# Patient Record
Sex: Male | Born: 1940
Health system: Southern US, Community
[De-identification: ages and names within clinical notes are randomized; demographics above are authoritative.]

## PROBLEM LIST (undated history)

## (undated) DIAGNOSIS — D369 Benign neoplasm, unspecified site: Secondary | ICD-10-CM

## (undated) DIAGNOSIS — J439 Emphysema, unspecified: Secondary | ICD-10-CM

## (undated) DIAGNOSIS — E785 Hyperlipidemia, unspecified: Secondary | ICD-10-CM

## (undated) DIAGNOSIS — I219 Acute myocardial infarction, unspecified: Secondary | ICD-10-CM

## (undated) DIAGNOSIS — M1990 Unspecified osteoarthritis, unspecified site: Secondary | ICD-10-CM

## (undated) DIAGNOSIS — I2510 Atherosclerotic heart disease of native coronary artery without angina pectoris: Secondary | ICD-10-CM

## (undated) DIAGNOSIS — I10 Essential (primary) hypertension: Secondary | ICD-10-CM

## (undated) DIAGNOSIS — M489 Spondylopathy, unspecified: Secondary | ICD-10-CM

## (undated) DIAGNOSIS — F419 Anxiety disorder, unspecified: Secondary | ICD-10-CM

## (undated) DIAGNOSIS — H409 Unspecified glaucoma: Secondary | ICD-10-CM

## (undated) DIAGNOSIS — Z972 Presence of dental prosthetic device (complete) (partial): Secondary | ICD-10-CM

## (undated) DIAGNOSIS — Z86711 Personal history of pulmonary embolism: Secondary | ICD-10-CM

## (undated) DIAGNOSIS — K529 Noninfective gastroenteritis and colitis, unspecified: Secondary | ICD-10-CM

## (undated) DIAGNOSIS — C801 Malignant (primary) neoplasm, unspecified: Secondary | ICD-10-CM

## (undated) DIAGNOSIS — M545 Low back pain, unspecified: Secondary | ICD-10-CM

## (undated) DIAGNOSIS — H269 Unspecified cataract: Secondary | ICD-10-CM

## (undated) DIAGNOSIS — G4730 Sleep apnea, unspecified: Secondary | ICD-10-CM

## (undated) DIAGNOSIS — G5602 Carpal tunnel syndrome, left upper limb: Secondary | ICD-10-CM

## (undated) DIAGNOSIS — I714 Abdominal aortic aneurysm, without rupture, unspecified: Secondary | ICD-10-CM

## (undated) DIAGNOSIS — J342 Deviated nasal septum: Secondary | ICD-10-CM

## (undated) HISTORY — DX: Carpal tunnel syndrome, left upper limb: G56.02

## (undated) HISTORY — DX: Deviated nasal septum: J34.2

## (undated) HISTORY — DX: Spondylopathy, unspecified: M48.9

## (undated) HISTORY — DX: Unspecified cataract: H26.9

## (undated) HISTORY — DX: Malignant (primary) neoplasm, unspecified: C80.1

## (undated) HISTORY — DX: Unspecified glaucoma: H40.9

## (undated) HISTORY — DX: Acute myocardial infarction, unspecified: I21.9

## (undated) HISTORY — PX: HERNIA REPAIR: SHX51

## (undated) HISTORY — DX: Noninfective gastroenteritis and colitis, unspecified: K52.9

## (undated) HISTORY — DX: Personal history of pulmonary embolism: Z86.711

## (undated) HISTORY — DX: Low back pain, unspecified: M54.50

## (undated) HISTORY — DX: Benign neoplasm, unspecified site: D36.9

## (undated) HISTORY — DX: Essential (primary) hypertension: I10

## (undated) HISTORY — DX: Emphysema, unspecified: J43.9

## (undated) HISTORY — PX: CATARACT EXTRACTION: SUR2

## (undated) HISTORY — DX: Sleep apnea, unspecified: G47.30

## (undated) HISTORY — PX: OTHER SURGICAL HISTORY: SHX169

## (undated) HISTORY — DX: Atherosclerotic heart disease of native coronary artery without angina pectoris: I25.10

## (undated) HISTORY — DX: Unspecified osteoarthritis, unspecified site: M19.90

## (undated) HISTORY — DX: Hyperlipidemia, unspecified: E78.5

## (undated) HISTORY — PX: EYE SURGERY: SHX253

## (undated) HISTORY — DX: Abdominal aortic aneurysm, without rupture, unspecified: I71.40

## (undated) HISTORY — PX: OLECRANON BURSECTOMY: SHX2097

## (undated) HISTORY — DX: Anxiety disorder, unspecified: F41.9

## (undated) HISTORY — DX: Low back pain: M54.5

## (undated) HISTORY — PX: HEMORRHOID BANDING: SHX5850

## (undated) HISTORY — PX: APPENDECTOMY: SHX54

---

## 1996-07-20 DIAGNOSIS — I219 Acute myocardial infarction, unspecified: Secondary | ICD-10-CM

## 1996-07-20 HISTORY — DX: Acute myocardial infarction, unspecified: I21.9

## 1999-07-13 ENCOUNTER — Emergency Department (HOSPITAL_COMMUNITY): Admission: EM | Admit: 1999-07-13 | Discharge: 1999-07-13 | Payer: Self-pay | Admitting: Emergency Medicine

## 1999-07-13 ENCOUNTER — Encounter: Payer: Self-pay | Admitting: Emergency Medicine

## 1999-08-15 ENCOUNTER — Ambulatory Visit: Admission: RE | Admit: 1999-08-15 | Discharge: 1999-08-15 | Payer: Self-pay | Admitting: Geriatric Medicine

## 1999-08-15 ENCOUNTER — Encounter: Payer: Self-pay | Admitting: Internal Medicine

## 2001-05-28 ENCOUNTER — Encounter: Payer: Self-pay | Admitting: Emergency Medicine

## 2001-05-28 ENCOUNTER — Emergency Department (HOSPITAL_COMMUNITY): Admission: EM | Admit: 2001-05-28 | Discharge: 2001-05-28 | Payer: Self-pay | Admitting: Emergency Medicine

## 2001-12-09 ENCOUNTER — Ambulatory Visit (HOSPITAL_COMMUNITY): Admission: RE | Admit: 2001-12-09 | Discharge: 2001-12-09 | Payer: Self-pay | Admitting: Gastroenterology

## 2001-12-09 ENCOUNTER — Encounter (INDEPENDENT_AMBULATORY_CARE_PROVIDER_SITE_OTHER): Payer: Self-pay | Admitting: Specialist

## 2003-05-21 ENCOUNTER — Ambulatory Visit (HOSPITAL_COMMUNITY): Admission: RE | Admit: 2003-05-21 | Discharge: 2003-05-21 | Payer: Self-pay | Admitting: Urology

## 2003-05-21 ENCOUNTER — Ambulatory Visit (HOSPITAL_BASED_OUTPATIENT_CLINIC_OR_DEPARTMENT_OTHER): Admission: RE | Admit: 2003-05-21 | Discharge: 2003-05-21 | Payer: Self-pay | Admitting: Urology

## 2003-05-21 ENCOUNTER — Encounter (INDEPENDENT_AMBULATORY_CARE_PROVIDER_SITE_OTHER): Payer: Self-pay | Admitting: Specialist

## 2004-08-01 ENCOUNTER — Ambulatory Visit (HOSPITAL_COMMUNITY): Admission: RE | Admit: 2004-08-01 | Discharge: 2004-08-02 | Payer: Self-pay | Admitting: Otolaryngology

## 2004-08-15 ENCOUNTER — Emergency Department (HOSPITAL_COMMUNITY): Admission: EM | Admit: 2004-08-15 | Discharge: 2004-08-15 | Payer: Self-pay | Admitting: Emergency Medicine

## 2006-11-08 ENCOUNTER — Inpatient Hospital Stay (HOSPITAL_COMMUNITY): Admission: AD | Admit: 2006-11-08 | Discharge: 2006-11-10 | Payer: Self-pay | Admitting: *Deleted

## 2006-11-10 ENCOUNTER — Encounter (INDEPENDENT_AMBULATORY_CARE_PROVIDER_SITE_OTHER): Payer: Self-pay | Admitting: *Deleted

## 2006-11-22 ENCOUNTER — Encounter: Admission: RE | Admit: 2006-11-22 | Discharge: 2006-11-22 | Payer: Self-pay | Admitting: Gastroenterology

## 2006-12-23 ENCOUNTER — Ambulatory Visit (HOSPITAL_COMMUNITY): Admission: RE | Admit: 2006-12-23 | Discharge: 2006-12-24 | Payer: Self-pay | Admitting: Orthopaedic Surgery

## 2007-10-06 ENCOUNTER — Encounter: Admission: RE | Admit: 2007-10-06 | Discharge: 2007-10-06 | Payer: Self-pay | Admitting: Geriatric Medicine

## 2010-02-05 ENCOUNTER — Ambulatory Visit: Payer: Self-pay | Admitting: Internal Medicine

## 2010-02-05 DIAGNOSIS — I252 Old myocardial infarction: Secondary | ICD-10-CM | POA: Insufficient documentation

## 2010-02-05 DIAGNOSIS — E785 Hyperlipidemia, unspecified: Secondary | ICD-10-CM | POA: Insufficient documentation

## 2010-02-05 DIAGNOSIS — I2510 Atherosclerotic heart disease of native coronary artery without angina pectoris: Secondary | ICD-10-CM | POA: Insufficient documentation

## 2010-02-05 DIAGNOSIS — G4730 Sleep apnea, unspecified: Secondary | ICD-10-CM

## 2010-02-05 DIAGNOSIS — G4710 Hypersomnia, unspecified: Secondary | ICD-10-CM | POA: Insufficient documentation

## 2010-02-05 DIAGNOSIS — F17200 Nicotine dependence, unspecified, uncomplicated: Secondary | ICD-10-CM

## 2010-03-12 ENCOUNTER — Encounter: Admission: RE | Admit: 2010-03-12 | Discharge: 2010-03-12 | Payer: Self-pay | Admitting: Orthopaedic Surgery

## 2010-03-20 HISTORY — PX: OTHER SURGICAL HISTORY: SHX169

## 2010-04-08 ENCOUNTER — Ambulatory Visit: Payer: Self-pay | Admitting: Internal Medicine

## 2010-04-08 ENCOUNTER — Encounter: Payer: Self-pay | Admitting: Internal Medicine

## 2010-04-08 DIAGNOSIS — R05 Cough: Secondary | ICD-10-CM

## 2010-04-22 ENCOUNTER — Encounter: Payer: Self-pay | Admitting: Internal Medicine

## 2010-04-29 ENCOUNTER — Telehealth: Payer: Self-pay | Admitting: Internal Medicine

## 2010-06-18 ENCOUNTER — Telehealth (INDEPENDENT_AMBULATORY_CARE_PROVIDER_SITE_OTHER): Payer: Self-pay | Admitting: *Deleted

## 2010-08-21 NOTE — Assessment & Plan Note (Signed)
Summary: 2 months/apc   Primary Brian [name]/Referring Brian [name]:  Brian [name]  CC:  Follow-up visit with PFTs  and pt compliant with cpap averages 6-7 hrs per night.  History of Present Illness: History of Present Illness: February 05, 2010- 68 yoM coming to re-establish for care of his obstructive sleep apnea. Originally dx'd 08/15/99 with moderate OSA- RDI 29/hr, He has used CPAP continuously since then and recently got a new machine. [facility]- CPAP 7 cwp, His weight has been stable. Bedtime 1130-12MN, latency 2-5 minutes, waking 2-3x/ night before up at 730AM. Usually feels rested.  Remote nasal septoplasty, MI, no lung or thyroid disease.   April 08, 2010- OSA,  tobacco,  CAD Continued CPAP at 7 without problems. Since he has gone many years without attention, we will get an autotitration. In last few days he has noted an incidental watery rhinorhea, scratchy throat and increased phelgm Has not been actively trying to stop smoking and we repeated discussion of this important  issue today. PFT- WNL, insignif response to dilator. FEV1/FVC 0.77 CXR 02/05/10-LLL atx or scar, NAD.  Preventive Screening-Counseling & Management  Alcohol-Tobacco     Smoking Status: current     Smoking Cessation Counseling: yes     Smoke Cessation Stage: precontemplative     Packs/Day: 0.75     Tobacco Counseling: to quit use of tobacco products  Current Medications (verified): 1)  Quinapril Hcl 10 Mg Tabs (Quinapril Hcl) .... Take 1 By Mouth Once Daily 2)  Lipitor 40 Mg Tabs (Atorvastatin Calcium) .... Take 1 By Mouth Once Daily 3)  Toprol Xl 25 Mg Xr24h-Tab (Metoprolol Succinate) .... Take 1 By Mouth Once Daily 4)  Aspirin 81 Mg Tbec (Aspirin) .... Take 1 By Mouth Once Daily 5)  Multivitamins  Tabs (Multiple Vitamin) .... Take 1 By Mouth Once Daily 6)  Cpap-Ahc .... Set On 7 7)  Citracal Plus Bone Density  Tabs (Multiple Minerals-Vitamins) .... Take 1 By Mouth Two Times A Day 8)  Coq10 100 Mg Caps  (Coenzyme Q10) .... 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   Risk Factors: Smoking Status: current (04/08/2010) Packs/Day: 0.75 (04/08/2010)  Past Medical History: Obstructive sleep apnea- NPSG 08/15/99 RDI 29/hr Coronary Heart Disease Myocardial Infarction Hyperlipidemia Tobacco use-                -PFT 04/08/10- FEV1 3.17/ 107%, FEV1/FVC 0.77, DLCO 77%  Review of Systems      See HPI  The patient denies anorexia, fever, weight loss, weight gain, vision loss, decreased hearing, hoarseness, chest pain, syncope, dyspnea on exertion, peripheral edema, prolonged cough, headaches, hemoptysis, abdominal pain, melena, hematochezia, and severe indigestion/heartburn.    Vital Signs:  Patient profile:   70 year old male Height:      70 inches Weight:      167 pounds O2 Sat:      93 % on Room air Pulse rate:   66 / minute BP sitting:   104 / 60  (left arm)  Vitals Entered By: Brian [name]  RCP, LPN (April 08, 2010 10:12 AM)  O2 Flow:  Room air CC: Follow-up visit with PFTs , pt compliant with cpap averages 6-7 hrs per night Comments Medications reviewed with patient Brian [name] RCP, LPN  April 08, 2010 10:13 AM    Physical Exam  Additional Exam:  General: A/Ox3; pleasant and cooperative, NAD, trim SKIN: no rash, lesions NODES: no lymphadenopathy HEENT: [name], EOM- WNL, Conjuctivae- clear, PERRLA, TM-WNL,  Nose- clear, Throat- clear and wnl, dentures, Mallampati  III NECK: Supple w/ fair ROM, JVD- none, normal carotid impulses w/o bruits Thyroid- normal to palpation CHEST: Clear to P&A- can make a raspy  HEART: RRR, no m/g/r heard ABDOMEN: Soft and nl; WUJ:WJXB, nl pulses, no edema  NEURO: Grossly intact to observation      CXR  Procedure date:  02/05/2010  Findings:      CHEST - 2 VIEW   Comparison: 12/22/2006   Findings: There is volume loss in the left lower lobe with associated tenting of the left hemidiaphragm.  The lungs are otherwise clear.  No confluent airspace opacities are seen.  The heart is normal in size.  There are atherosclerotic calcifications are seen in the aortic arch.  Note is made of a coronary artery stent.  The upper abdomen and osseous structures are normal.   IMPRESSION: Left lower lobe atelectasis versus scarring without acute cardiopulmonary disease.   Read By:  Brian [name],  M.D.     Released By:  Brian [name],  M.D.  _______  Pulmonary Function Test Date: 04/08/2010 Height (in.): 70 Gender: Male  Pre-Spirometry FVC    Value: 4.13 L/min   Pred: 4.39 L/min     % Pred: 94% % FEV1    Value: 3.17 L     Pred: 2.97 L     % Pred: 107% % FEV1/FVC  Value: 77 %     Pred: 69 %    FEF 25-75  Value: 2.88 L/min   Pred: 2.70 L/min     % Pred: 107% %  Post-Spirometry FVC    Value: 4.14 L/min   Pred: 4.39 L/min     % Pred: 94% % FEV1    Value: 3.17 L     Pred: 2.97 L     % Pred: 107% % FEV1/FVC  Value: 77 %     Pred: 69 %    FEF 25-75  Value: 2.92 L/min   Pred: 2.70 L/min     % Pred: 108% %  Lung Volumes TLC    Value: 6.06 L   % Pred: 93% % RV    Value: 194 L   % Pred: 77% % DLCO    Value: 15.6 %   % Pred: 77% % DLCO/VA  Value: 2.65 %   % Pred: 75% %  Impression & Recommendations:  Problem # 1:  HYPERSOMNIA WITH SLEEP APNEA UNSPECIFIED (ICD-780.53)  We will get an autotitration done to verify pressure setting is appropriate still, since  it has been many years. Symptomatically he is doing well. He will go together with wife to get flu vax. [name] gave pneumovax years ago.  Problem # 2:  TOBACCO USER (ICD-305.1)  We discussed his very good PFT scores and pointed out that his cardiac disease is good reason to stop smoking.  Other Orders: Est. Patient Level IV (14782) DME Referral (DME)  Patient Instructions: 1)  Please schedule a follow-up appointment in 1 year. 2)   I am pleased your breathing tests are as  good as they are, but for the sake of your heart, i still think it is important that you stop smoking. Nicotine patches may be a good place to start. 3)  See [facility] to set up an autotitration check of your CPAP pressure. We will recommend an adjustment of the pressure if appropriate.   CXR  Procedure date:  02/05/2010  Findings:      CHEST - 2 VIEW   Comparison: 12/22/2006   Findings: There is volume loss in the left lower lobe with associated tenting of the left hemidiaphragm.  The lungs are otherwise clear.  No confluent airspace opacities are seen.  The heart is normal in size.  There are atherosclerotic calcifications are seen in the aortic arch.  Note is made of a coronary artery stent.  The upper abdomen and osseous structures are normal.   IMPRESSION: Left lower lobe atelectasis versus scarring without acute cardiopulmonary disease.   Read By:  Brian [name],  M.D.     Released By:  Brian [name],  M.D.  _______

## 2010-08-21 NOTE — Progress Notes (Signed)
Summary: cpap results  Phone Note Call from Patient Call back at [phone]   Caller: Patient Call For: young Reason for Call: Lab or Test Results Summary of Call: Request his cpap results. Initial call taken by: Darletta Moll,  June 18, 2010 9:30 AM  Follow-up for Phone Call        Pt informed that per note made by CDY on 04/29/2010, cpap compliance looked good and he is leaving setting at 7 for now. Pt had verbal understanding of this and says he is doing well on cpap and having no problems. Follow-up by: Michel Bickers CMA,  June 18, 2010 10:41 AM

## 2010-08-21 NOTE — Assessment & Plan Note (Signed)
Summary: RE-ESTABLISH SLEEP PT ///kp   CC:  Re-establish sleep pt-[facility]..  History of Present Illness: February 05, 2010- 70 yoM coming to re-establish for care of his obstructive sleep apnea. Originally dx'd 08/15/99 with moderate OSA- RDI 29/hr, He has used CPAP continuously since then and recently got a new machine. [facility]- CPAP 7 cwp, His weight has been stable. Bedtime 1130-12MN, latency 2-5 minutes, waking 2-3x/ night before up at 730AM. Usually feels rested.  Remote nasal septoplasty, MI, no lung or thyroid disease.   Preventive Screening-Counseling & Management  Alcohol-Tobacco     Smoking Status: current     Smoking Cessation Counseling: yes     Smoke Cessation Stage: precontemplative     Packs/Day: 0.75     Tobacco Counseling: to quit use of tobacco products  Current Medications (verified): 1)  Quinapril Hcl 10 Mg Tabs (Quinapril Hcl) .... Take 1 By Mouth Once Daily 2)  Lipitor 40 Mg Tabs (Atorvastatin Calcium) .... Take 1 By Mouth Once Daily 3)  Toprol Xl 25 Mg Xr24h-Tab (Metoprolol Succinate) .... Take 1 By Mouth Once Daily 4)  Aspirin 81 Mg Tbec (Aspirin) .... Take 1 By Mouth Once Daily 5)  Multivitamins  Tabs (Multiple Vitamin) .... Take 1 By Mouth Once Daily 6)  Cpap-Ahc .... Set On 7 7)  Citracal Plus Bone Density  Tabs (Multiple Minerals-Vitamins) .... Take 1 By Mouth Two Times A Day 8)  Coq10 100 Mg Caps (Coenzyme Q10) .... 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 Smoking Status:  current Packs/Day:  0.75  Review of Systems       The patient complains of shortness of breath with activity, productive cough, nasal congestion/difficulty breathing through nose, sneezing, itching, and hand/feet swelling.  The patient denies shortness of breath at rest, non-productive cough, coughing up blood, chest pain, irregular heartbeats, acid heartburn, indigestion, loss of appetite, weight change, abdominal pain, difficulty swallowing, sore throat, tooth/dental problems, headaches, ear ache, anxiety, depression, joint stiffness or pain, rash, change in color of mucus, and fever.         Cough when he rolls over in sleep at night  Vital Signs:  Patient profile:   70 year old male Height:      70 inches Weight:      170.38 pounds BMI:     24.54 O2 Sat:      98 % on Room air Pulse rate:   65 / minute BP sitting:   116 / 80  (left arm) Cuff size:   regular  Vitals Entered By: Reynaldo Minium CMA (February 05, 2010 10:52  AM)  O2 Flow:  Room air  Physical Exam  Additional Exam:  General: A/Ox3; pleasant and cooperative, NAD, trim SKIN: no rash, lesions NODES: no lymphadenopathy HEENT: [place]/AT, EOM- WNL, Conjuctivae- clear, PERRLA, TM-WNL, Nose- clear, Throat- clear and wnl, dentures, Mallampati  III NECK: Supple w/ fair ROM, JVD- none, normal carotid impulses w/o  bruits Thyroid- normal to palpation CHEST: Clear to P&A- coughed once with deep breath HEART: RRR, no m/g/r heard ABDOMEN: Soft and nl; nml bowel sounds; no organomegaly or masses noted ZOX:WRUE, nl pulses, no edema  NEURO: Grossly intact to observation      Impression & Recommendations:  Problem # 1:  HYPERSOMNIA WITH SLEEP APNEA UNSPECIFIED (ICD-780.53)  Good compliance and control on CPAP 7. No changes are needed for now. He is used to CPAP now. Has begun sleeping on his stomach more and coughing a bit.   Problem # 2:  TOBACCO USER (ICD-305.1)  He has smoked a long time.with some change in cough; we will get PFT and CXR I have spoken with him about smoking cessation- motivation and available support, with cessation program referral information.  Medications Added to Medication List This Visit: 1)  Quinapril Hcl 10 Mg Tabs (Quinapril hcl) .... Take 1 by mouth once daily 2)  Lipitor 40 Mg Tabs (Atorvastatin calcium) .... Take 1 by mouth once daily 3)  Toprol Xl 25 Mg Xr24h-tab (Metoprolol succinate) .... Take 1 by mouth once daily 4)  Aspirin 81 Mg Tbec (Aspirin) .... Take 1 by mouth once daily 5)  Multivitamins Tabs (Multiple vitamin) .... Take 1 by mouth once daily 6)  Cpap-ahc  .... Set on 7 7)  Citracal Plus Bone Density Tabs (Multiple minerals-vitamins) .... Take 1 by mouth two times a day 8)  Coq10 100 Mg Caps (Coenzyme q10) .... Take 1 by mouth once daily  Other Orders: New Patient Level IV (45409) T-2 View CXR (71020TC)  Patient Instructions: 1)  Please schedule a follow-up appointment in 2 months. 2)  Continue CPAP at 7. 3)  Schedule PFT 4)  A chest x-ray has been recommended.  Your imaging study may require preauthorization.

## 2010-08-21 NOTE — Miscellaneous (Signed)
Summary: Orders Update-PFT CHARGES  Clinical Lists Changes  Problems: Added new problem of COUGH (ICD-786.2) Orders: Added new Service order of Spirometry (Pre & Post) [phone]) - Signed Added new Service order of Lung Volumes (19147) - Signed Added new Service order of Carbon Monoxide diffusing w/capacity (82956) - Signed

## 2010-08-21 NOTE — Progress Notes (Signed)
Summary: CPAP compliance download.- leave on 7 cwp.  Phone Note Other Incoming   Summary of Call: CPAP compliance check from [facility] - Good compliance and control on 7.8 cwp. He is set on 7 so I will leave him there for now.  Initial call taken by: Waymon Budge MD,  April 29, 2010 8:53 AM

## 2010-12-02 NOTE — Op Note (Signed)
[name], [name]               ACCOUNT NO.:  [id number]   MEDICAL RECORD NO.:  [id number]          PATIENT TYPE:  OIB   LOCATION:  2550                         FACILITY:  [facility]   PHYSICIAN:  Brian Basque. Dalldorf, M.D.DATE OF BIRTH:  April 20, 1941   DATE OF PROCEDURE:  12/23/2006  DATE OF DISCHARGE:                               OPERATIVE REPORT   PREOPERATIVE DIAGNOSES:  Right foot first metatarsophalangeal  degenerative arthritis.   POSTOPERATIVE DIAGNOSES:  Right foot first metatarsophalangeal  degenerative arthritis.   PROCEDURE:  Right foot first metatarsophalangeal arthroplasty.   ANESTHESIA:  General and block.   ATTENDING SURGEON:  Brian Basque. Jerl [name], M.D.   ASSISTANT:  Brian [name], P.A.   INDICATIONS FOR PROCEDURE:  The patient is a 70 year old man with a long  history of a painful right forefoot.  This has persisted despite various  oral anti-inflammatories, injections and shoe modifications.  He has  advanced degenerative change of the first MTP joint and at this point is  offered an arthroplasty.  Informed operative consent was obtained after  discussion of possible complications of, reaction to anesthesia,  infection, neurovascular injury and failure.   SUMMARY OF FINDINGS AND PROCEDURE:  Under general anesthesia and a  block, through a dorsomedial incision, a right foot first MTP  arthroplasty was performed.  He had advanced degenerative change but  excellent bone quality.  I removed some large spurs from around the  joint and placed a size five [facility] MTP arthroplasty implant  with grommets on both sides.  I used fluoroscopy throughout the case to  make appropriate intraoperative incisions read these views myself.  Brian [name] assisted throughout and was invaluable to the completion of the  case in that he helped position and retract while I performed the  procedure.  He also closed simultaneously to help minimize OR time.   DESCRIPTION OF  PROCEDURE:  The patient was taken to the operating suite  where general anesthetic was applied without difficulty.  He also given  a block in the preanesthesia area.  He was positioned supine and prepped  and draped in normal sterile fashion.  After the administration of IV  Kefzol the right leg was elevated, exsanguinated, tourniquet inflated  about the calf.  A dorsomedial incision was made with dissection down to  the capsule.  A Y-shaped incision was made here with a distally based  flap.  The first MTP joint was well visualized with Brian [name] retractors  around each side of the metatarsal head.  I removed some large  osteophytes both medial, lateral, and dorsal.  I then used an  oscillating saw to make a distal cut on the metatarsal head in its  thickest portion with about 10 degrees of valgus.  We then made a cut  perpendicular to shaft of the proximal phalanx at its base also with the  oscillating saw.  I then used a small bur and the broaches and he seemed  to accept a size 5 the best.  We performed a trial reduction.  This  seemed to correct the mild valgus deformity appropriately and  seemed to  fit without overstuffing the joint.  We then thoroughly irrigated and  cleaned both bones.  We opened the size 5 [facility] arthroplasty  implant.  I placed the grommet on the end of the metatarsal and then on  the base of the proximal phalanx.  We then placed the component with the  open side up appropriately.  The toe seemed to achieve good alignment.  Fluoroscopy was used to confirm adequate resection of spurs and  placement of the component.  I read these views myself.  The wounds were  again irrigated followed by reapproximation of the capsule with #1  Vicryl in interrupted fashion.  The tourniquet was deflated and a small  amount of bleeding was controlled Bovie cautery and pressure.  His toe  became pink and warm immediately.  Subcutaneous tissues reapproximated  with 2-0 undyed  Vicryl and skin was closed with nylon.  Adaptic was  placed over the wound followed by dry gauze and loose Ace wrap.  Estimated blood loss and intraoperative fluids as well as accurate  tourniquet time can be obtained from anesthesia records.   DISPOSITION:  The patient was extubated in the operating room and taken  to recovery in stable condition.  He will be admitted overnight for  monitoring due to his sleep apnea.  He will likely be discharged home in  the morning.      Brian [name], M.D.  Electronically Signed     PGD/MEDQ  D:  12/23/2006  T:  12/23/2006  Job:  [id number]

## 2010-12-05 NOTE — H&P (Signed)
NAMEHASSEN, BRUUN               ACCOUNT NO.:  [id number]   MEDICAL RECORD NO.:  [id number]          PATIENT TYPE:  INP   LOCATION:  1404                         FACILITY:  [name]   PHYSICIAN:  Hal T. Stoneking, M.D. DATE OF BIRTH:  08-May-1941   DATE OF ADMISSION:  11/08/2006  DATE OF DISCHARGE:                              HISTORY & PHYSICAL   IDENTIFYING DATA:  Mr. Hoffert is a very nice 70 year old white male.  Mr. Degroat has a history of bright red rectal bleeding approximately 5  years ago.  At that time did not require hospitalization, but he did go  on to have a colonoscopy by Dr. Sherin Quarry which revealed diverticulosis.  Biopsies were done as well which were negative for any type of mucosal  abnormality.  This study was done Dec 09, 2001.  He had been feeling  well until about 3 o'clock this morning when he was awakened with  abdominal discomfort and some grumbly sounds in his lower abdomen.  He  went and sat on the commode and had a loose bowel movement.  This  episode recurred an hour or so later.  He did not look down into the  commode; in fact, it was dark,  to examine what the diarrhea looked  like.  Then about 7 o'clock this morning he had an episode, again had  what felt like diarrhea, got up from the commode and saw some blood in  the bottom of the toilet bowl but no clots.  He has felt a little weak  and tired.  He is on a baby aspirin every day and still smokes about one-  half pack of cigarettes a day.  His rarely consumes alcohol.  He also  has a history of coronary artery disease but has had no chest pain or  shortness of breath.   PAST MEDICAL HISTORY:  He has no known drug allergies.   CURRENT MEDICATIONS:  1. Aspirin 81 mg a day.  2. Accupril 10 mg a day.  3. Generic Toprol XL 25 mg a day.  4. Lipitor 40 mg a day.  5. He occasionally takes Fioricet for headache.   PAST MEDICAL HISTORY:  Remarkable for:  1. Allergic in vasomotor rhinitis.  2. History of  a deviated nasal septum.  3. History of sleep apnea.  4. History of chronic low back pain.  5. History of coronary artery disease.  6. History of hypertension.  7. History of diverticulosis.   HEALTH MAINTENANCE:  He had the colonoscopy in May 2003.   FAMILY HISTORY:  Grandfather died of some type of cancer.  Father died  of renal failure and hypertension.  Mother died with CHF, osteoporosis.  Paternal uncle had colon cancer.  Daughter in good health.   SOCIAL HISTORY:  Mr. Moline is married.  He has one daughter.  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  He smokes about one-half pack of cigarettes a  day, rarely consumes alcohol.   REVIEW OF SYSTEMS:  No headache, no change in  vision.  No trouble  chewing or swallowing.  Otherwise GI: Please see HPI.  MUSCULOSKELETAL:  Unremarkable.   PHYSICAL EXAMINATION:  VITAL SIGNS:  Weight 168, temperature 97.8, pulse  64, blood pressure seated 138/82, standing 118/70. Pulse rate goes from  64-70.  SKIN:  Warm and dry.  HEENT: Unremarkable.  LUNGS: Clear.  HEART: Regular rate and rhythm without murmurs, rubs or gallops.  ABDOMEN: Soft.  Slight lower abdominal discomfort to deep palpation, but  no masses felt.  RECTAL:  Exam revealed good tone.  Prostate 2+, no nodules felt.  There  was no stool in the rectal vault.  There was just mucous and some  streaking of blood which was indeed heme positive.   ASSESSMENT:  1. Bloody bowel movement may be the result rather than the cause of      his sensation of diarrhea, although he does have a history of      diverticulosis, and it is possible he could be having diverticular      bleed.  He was, as recorded above, not able to examine the first      two bowel movements.  As he does have a postural blood pressure      drop, we will put him in the hospital under telemetry for 24-hour      observation.  He did have a colonoscopy 5 years  ago that did show      the diverticulosis.  2. Coronary artery disease.  This is stable.  We will hold his aspirin      and his Accupril with his blood pressure drop. We will continue the      Toprol XL.  Will check his CBC q.6 h, and, if this is stable and he      has no other evidence of bleeding by morning, then likely he could      be discharged and followed up as an outpatient.           ______________________________  Sunday Spillers. Pete Glatter, M.D.     HTS/MEDQ  D:  11/08/2006  T:  11/08/2006  Job:  [id number]

## 2010-12-05 NOTE — Consult Note (Signed)
Brian [name], Brian [name]               ACCOUNT NO.:  [id number]   MEDICAL RECORD NO.:  [id number]          PATIENT TYPE:  EMS   LOCATION:  MAJO                         FACILITY:  [facility]   PHYSICIAN:  Jefry H. Pollyann Kennedy, MD     DATE OF BIRTH:  [birth year]   DATE OF CONSULTATION:  08/15/2004  DATE OF DISCHARGE:  08/15/2004                                   CONSULTATION   REASON FOR CONSULTATION:  Postop epistaxis.   HISTORY:  This is a 70 year old gentleman who underwent nasal septoplasty  with excision of turbinates about two weeks prior with Dr. Jearld Fenton.  He had  done extremely well postoperatively until last night when he started having  bleeding from the left side of his nose.  It was anterior and posterior.  He  was evaluated earlier this morning by the emergency department staff and  found to have bleeding posteriorly in the nasal cavity and I was called in  for assistance with his care.  Medical and surgical history, otherwise,  unremarkable.   PHYSICAL EXAMINATION:  Old and fresh blood, left nasal cavity and  nasopharynx.  Clot was cleaned out.  Afrin topically was applied to the left  nasal cavity.  1% Xylocaine with epinephrine was also infiltrated into the  septum and the inferior and middle turbinates.  Careful inspection of the  nasal cavity revealed some active bleeding from the cut surface midway back  of the inferior turbinate.   This area was cauterized with silver nitrate.  Surgicel packing was placed  up against the turbinate and a long Merocel pack was then inserted and  inflated with the local anesthetic solution.  He is instructed to follow up  on Monday when he was scheduled for follow up for Dr. Jearld Fenton to take his  packing out.  He is to continue taking the antibiotics that he was  previously taking which is Augmentin which he has enough to last until the  packing comes out and he is instructed to contact us immediately if he has  any further bleeding.      JHR/MEDQ   D:  08/15/2004  T:  08/15/2004  Job:  [id number]

## 2010-12-05 NOTE — Discharge Summary (Signed)
Brian [name], Brian [name]               ACCOUNT NO.:  [id number]   MEDICAL RECORD NO.:  [id number]          PATIENT TYPE:  INP   LOCATION:  1404                         FACILITY:  [facility]   PHYSICIAN:  Theone Stanley, MD   DATE OF BIRTH:  [birth year]   DATE OF ADMISSION:  11/08/2006  DATE OF DISCHARGE:                               DISCHARGE SUMMARY   ADMISSION DIAGNOSES:  1. Bright red blood per rectum.  2. History of deviated septum.  3. Obstructive sleep apnea.  4. Chronic low back pain.  5. History of coronary artery disease.  6. Hypertension.  7. Diverticulosis.   DISCHARGE DIAGNOSES:  To be dictated by discharging physician.   CONSULTATIONS:  Dr. Ewing Schlein with [facility].   PROCEDURES / DIAGNOSTIC TESTS:  The patient will have a colonoscopy.  Results need to be dictated by discharging physician.   HOSPITAL COURSE:  [name] is a very nice 70 year old gentleman who  apparently had an episode of rectal bleeding about 5 years ago.  A  colonoscopy was performed by Dr. Janann August which revealed diverticulosis.  Biopsies were done which were negative.  At 3:00 on April 20, he awoke,  felt some abdominal cramping, went to the restroom and had bowel  movement.  He went to bed, woke up again and had another bowel movement.  The third time around he noticed that there was an unusual smell with  his bowel movements and therefore he got suspicious.  He turned on the  light and noticed that he had bright red blood per rectum and this was  enough to prompt him to go to Dr. Laverle Hobby.  Dr. Pete Glatter admitted  the patient.  During his stay the patient had one more episode of bright  red blood per rectum.  His hemoglobin and hematocrit remained stable.  He did have evidence of some orthostatics.  After discussing with the  patient the fact that this most likely is a diverticulosis issue or  possibly hemorrhoids, after much discussion of the fact that he had  another episode of bleeding, I asked  Gastroenterology to see the patient  and  they will set up a colonoscopy on November 10, 2006.  If this is negative  or no evidence of major abnormalities, he will be discharged on April  23.   Discharge diagnoses, results of tests, discharge medications and  followup will be dictated by discharging physician.      Theone Stanley, MD  Electronically Signed     AEJ/MEDQ  D:  11/09/2006  T:  11/09/2006  Job:  [id number]   cc:   Hal T. Stoneking, M.D.  Fax: [phone]   Dr. Josefa Half, M.D.  Fax: [phone]

## 2010-12-05 NOTE — Discharge Summary (Signed)
NAMELESHAUN, BIEBEL NO.:  [id number]   MEDICAL RECORD NO.:  [id number]          PATIENT TYPE:  INP   LOCATION:  1404                         [facility]ACILITY:  [facility]   PHYSICIAN:  Barnetta Chapel, MDDATE OF BIRTH:  1941/07/02   DATE OF ADMISSION:  11/08/2006  DATE OF DISCHARGE:                               DISCHARGE SUMMARY   ADDENDUM:  This is actually an addendum to the discharge summary done by  Dr. Jasmine Pang on November 09, 2006.   PRIMARY CARE PHYSICIAN:  Hal T. Stoneking, M.D.   DISCHARGE DIAGNOSES:  1. Gastrointestinal bleed.  2. History of coronary artery disease.  3. Questionable mild inflammation of the colon.  4. Chronic low back pain.  5. Hypertension.  6. History of diverticulosis.  7. Obstructive sleep apnea.   DISCHARGE MEDICATIONS:  1. Toprol-XL 25 mg one daily.  2. Lipitor 40 mg p.o. one daily.  3. Accupril 10 mg p.o. one daily.  4. Multivitamin one tablet one p.o. daily.  5. Hydrocodone/APAP 5/500 p.r.n. for pain.  6. The patient takes aspirin 325 mg p.o. one daily; however, the GI      physician, Dr. Ewing Schlein, has directed the patient should hold aspirin      for 1 week.   PROCEDURES DONE:  The patient had a colonoscopy done on November 10, 2006  by Dr. Ewing Schlein.  The colonoscopy revealed questionable mild inflammation.  No obvious bleeding was found.  Dr. Ewing Schlein has also taken biopsies, which  he plans to review with the patient in his office on the next visit.  Dr. Ewing Schlein has directed that the patient should be off aspirin for at  least 1 week.   HOSPITAL COURSE:  Please refer to the hospital course dictated by Dr.  Jomarie Longs.  This patient has remained stable.  He is no longer having any  bleed.  Vital signs are all stable.  On the day of discharge, the  temperature was 97.9, blood pressure was 110/66 mmHg, heart rate was 66,  respiratory rate was 20 with O2 saturation of 97% on room air.  White  blood count was 10.8, hemoglobin of 12.9,  hematocrit of 37.7, platelet  count of 229, MCV of 87.3.  The patient will be discharged back home  today to the care of the primary care Hence Derrick.   DISCHARGE PLANS:  1. Discharge patient back home today.  2. Follow up with Dr. Pete Glatter in one week.  3. Follow up with Dr. Ewing Schlein as an outpatient.  The patient is to call      for this appointment.  4. Activity as tolerated.  5. Cardiac diet.  6. Please hold aspirin for 1 week.      Barnetta Chapel, MD  Electronically Signed     SIO/MEDQ  D:  11/10/2006  T:  11/10/2006  Job:  [id number]   cc:   Hal T. Stoneking, M.D.  Fax: [phone]   Petra Kuba, M.D.  Fax: [phone]

## 2010-12-05 NOTE — Op Note (Signed)
NAME:  Brian [name], Brian [name]                         ACCOUNT NO.:  [id number]   MEDICAL RECORD NO.:  [id number]                   PATIENT TYPE:  AMB   LOCATION:  NESC                                 FACILITY:  [facility]   PHYSICIAN:  Bertram Millard. Dahlstedt, M.D.          DATE OF BIRTH:  [birth year]   DATE OF PROCEDURE:  05/21/2003  DATE OF DISCHARGE:                                 OPERATIVE REPORT   PREOPERATIVE DIAGNOSIS:  Right spermatocele.   POSTOPERATIVE DIAGNOSIS:  Right spermatocele.   PRINCIPAL PROCEDURE:  Right spermatocelectomy.   SURGEON:  Bertram Millard. Dahlstedt, M.D.   ANESTHESIA:  General with LMA.   COMPLICATIONS:  None.   BRIEF HISTORY:  This 70 year old gentleman has been seen by me a couple of  times with a right scrotal mass.  This was diagnosed as a spermatocele.  It  was recommended that he continue with conservative watchful waiting.  The  patient has had some symptoms associated with spermatocele, namely pressure  and just a vague sense of an ache.  The patient presented to my office  recently, requesting surgical removal of this cyst.  He has had ultrasound  confirmation of the spermatocele.   In discussing the surgery with him, I discussed the risks and complications  of the procedure.  This includes, but is not limited to infection, bleeding,  reformation of the cyst, and wound problems.  He is aware of the risks and  desires to proceed.   DESCRIPTION OF PROCEDURE:  The patient was administered a general anesthetic  and placed in the supine position.  The genitalia and perineum were prepped  and draped.  A 3 cm incision was made in the midline raphe and carried down  through the Dartos fascia with electrocautery.  The right spermatocele and  testicle were then delivered from the wound.  The spermatocele was carefully  dissected circumferentially down to its merging with the epididymis.  There  were actually two to three small loculations of this one  spermatocele.  The  spermatocele was dissected off of the epididymis.  The spermatocele was then  passed from the table.  It had been decompressed.  The open epididymis was  cauterized.  Small bleeders throughout the scrotum were cauterized in the  same fashion.  The cord was blocked with 5 mL of 0.5% plain Marcaine.  The  testicle was then inspected and found to be hemostatic.  It was replaced in  the right hemiscrotum.  The scrotum was closed in two layers with running  chromic.  Tegaderm dressing was placed and a dry sterile fluff dressing  was placed underneath a jock strap.   The patient tolerated the procedure well.  The estimated blood loss was  approximately 5 mL.   The patient was sent home with Vicodin #20 one p.o. q.4h. p.r.n. pain.  He  was also sent home with scrotal procedure instructions.  He will follow up  to see me in two weeks.                                               Bertram Millard. Dahlstedt, M.D.    SMD/MEDQ  D:  05/21/2003  T:  05/21/2003  Job:  [id number]   cc:   Hal T. Stoneking, M.D.  [address] [place], [place] [address]  Fax: [phone]

## 2010-12-05 NOTE — Op Note (Signed)
Brian [name], Brian [name]               ACCOUNT NO.:  [id number]   MEDICAL RECORD NO.:  [id number]          PATIENT TYPE:  INP   LOCATION:  1404                         FACILITY:  [facility]   PHYSICIAN:  Petra Kuba, M.D.    DATE OF BIRTH:  [birth year]   DATE OF PROCEDURE:  11/10/2006  DATE OF DISCHARGE:                               OPERATIVE REPORT   PROCEDURE:  Colonoscopy with biopsy.   INDICATIONS:  Lower GI bleeding.  Due for colonic screening.  Consent  was signed after risks, benefits, methods, options thoroughly discussed  by both myself, Dr. Jomarie Longs and Dr. Bosie Clos prior to any sedation.   MEDICINES USED:  Fentanyl 100 mcg, Versed 9 mg.   PROCEDURE:  Rectal inspection is pertinent for small external  hemorrhoids.  Digital exam is negative.  The video pediatric colonoscope  was inserted.  There was some difficulty due to a long looping colon,  was able to be advanced to the cecum.  No blood was seen on insertion.  Only a rare left-sided diverticula were seen.  The prep was fairly  adequate.  There was some liquid stool that required washing and  suctioning.  On slow withdrawal through the colon, we periodically fell  back around loops and we did try to readvance around these curves to  decrease the chances of missing things.  On slow withdrawal through the  colon other than some distal transverse and approximate splenic flexure  inflammation which correspond approximately to where Dr. Sherin Quarry had  seen some inflammation before, this was very mild and a few scattered  biopsies were obtained.  No other abnormalities were seen as we slowly  withdrew back to the rectum.  Specifically no polyps, tumors, masses,  other diverticula or signs of bleeding.  Once back in the rectum,  anorectal pull-through and retroflexion confirmed some small  hemorrhoids.  Scope was straightened readvanced a short ways up the left  side of the colon.  Air was suctioned, scope removed.  The patient  tolerated the procedure well.  There was no obvious immediate  complication.   ENDOSCOPIC DIAGNOSES:  1. Internal-external small hemorrhoids.  2. Rare left-sided diverticula.  3. Long looping tortuous colon.  4. Mild inflammation in the splenic flexure and distal transverse      status post biopsy, questionable etiology, possibly ischemic.  5. Otherwise within normal limits to the cecum without any blood being      seen.   PLAN:  Await pathology.  Reevaluate his aspirin needs and restarting  them in the future per Dr. Pete Glatter.  Happy to see back p.r.n. or can  get Dr. Lucretia Kern opinion.  Might want an ischemic workup with either a  CTA, MRA or  even any angiogram if both of these episodes are compatible with  ischemia.  Will have to rereview Dr. Gloris Ham biopsies to see what was  said about them and compare them to the current pathology.  Okay with me  to go home if no delayed complications.           ______________________________  Petra Kuba, M.D.  MEM/MEDQ  D:  11/10/2006  T:  11/10/2006  Job:  [id number]   cc:   Theone Stanley, MD   Hal T. Stoneking, M.D.  Fax: [phone]   Tasia Catchings, M.D.  Fax: [phone]

## 2010-12-05 NOTE — Consult Note (Signed)
Brian [name], Brian [name]               ACCOUNT NO.:  [id number]   MEDICAL RECORD NO.:  [id number]          PATIENT TYPE:  INP   LOCATION:  1404                         FACILITY:  [facility]   PHYSICIAN:  Shirley Friar, MDDATE OF BIRTH:  23-May-1941   DATE OF CONSULTATION:  11/10/2006  DATE OF DISCHARGE:                                 CONSULTATION   INDICATION:  Rectal bleeding.   HISTORY OF PRESENT ILLNESS:  Mr. Siefert was seen in consultation at the  request of Dr. Jomarie Longs in regards to his rectal bleeding.  He is a 70-  year-old white male with a history of diverticulosis and a colonoscopy  five years ago by Dr. Sherin Quarry who presented with the acute onset of  crampy abdominal pain followed by four episodes of bright red blood per  rectum that started on Sunday, November 07, 2006.  The first three episodes  occurred without adequate visualization of the toilet water since he had  crampy abdominal pain followed by urge to move his bowels and did so  early in the morning in his bathroom.  The last episode he witnessed and  showed bright red blood per rectum in the bowl without any stool.  He  denies any NSAID use or excessive daily aspirin.  He denies any black  stools.  On admission his hemoglobin was 14.6 with a normal BUN and he  was hemodynamically stable.   PAST MEDICAL HISTORY:  1. Coronary artery disease.  2. Hypertension.  3. Sleep apnea.  4. Allergic rhinitis.  5. Chronic back pain.  6. History of diverticulosis as stated above.   MEDICINES ON ADMISSION:  1. Aspirin 81 mg daily.  2. Accupril 10 mg daily.  3. Toprol XL 25 mg daily.  4. Lipitor 40 mg daily.   ALLERGIES:  NO KNOWN DRUG ALLERGIES.   FAMILY HISTORY:  Maternal uncle had colon cancer.  No other colon cancer  history.   SOCIAL HISTORY:  Rare alcohol use.  Positive tobacco.   PHYSICAL EXAMINATION:  VITAL SIGNS:  Temperature 97.9, pulse 66, blood  pressure 110/66, O2 saturation is 97% on room air.  GENERAL:   Alert in no acute distress.  ABDOMEN:  Soft, nontender, nondistended.  Active bowel sounds.  CARDIOVASCULAR:  Regular rate and rhythm.  CHEST:  Clear to auscultation bilaterally.   LABORATORY DATA:  Hemoglobin 12.9, white blood cell count 10.8, platelet  count 229.  BUN on admission 16, INR 0.9.   IMPRESSION:  A 70 year old male with acute onset of bright red blood per  rectum that I suspect is a diverticular bleed more so than hemorrhoids,  although that is also possible.  Malignancy is unlikely, but still in  the differential.  Agree with taking the patient for a colonoscopy which  will take place today by Dr. Ewing Schlein.  I discussed the risks and benefits  of the procedure with Mr. Andres and he agrees to proceed.  If his  colonoscopy is unremarkable or if there is no evidence of any further  active bleeding then the patient should be able to go home later today.  A final decision will be made after colonoscopy has been completed.      Shirley Friar, MD  Electronically Signed     VCS/MEDQ  D:  11/10/2006  T:  11/10/2006  Job:  [id number]   cc:   Tasia Catchings, M.D.  Fax: [phone]   Hal T. Stoneking, M.D.  Fax: [phone]   Petra Kuba, M.D.  Fax: [phone]

## 2010-12-05 NOTE — Op Note (Signed)
[name], [name] NO.:  [id number]   MEDICAL RECORD NO.:  [id number]          PATIENT TYPE:  OIB   LOCATION:  NA                           FACILITY:  [facility]   PHYSICIAN:  Suzanna Obey, M.D.       DATE OF BIRTH:  1941/07/17   DATE OF PROCEDURE:  08/01/2004  DATE OF DISCHARGE:                                 OPERATIVE REPORT   PREOPERATIVE DIAGNOSES:  1.  Deviated septum.  2.  Turbinate hypertrophy.   POSTOPERATIVE DIAGNOSIS:  1.  Deviated septum.  2.  Turbinate hypertrophy.   SEPTOPLASTY PROCEDURES:  1.  Septoplasty.  2.  Submucous resection of inferior turbinates.   ANESTHESIA:  General endotracheal tube.   ESTIMATED BLOOD LOSS:  Approximately 10 mL.   INDICATIONS:  This is a 70 year old who has had obstructive sleep apnea that  has been refractory to therapy.  CPAP was difficult because of his nasal  obstruction.  He has a deviated septum and nasal obstruction for a long  time.  He was informed of the risks and benefits of the procedure, including  bleeding, infection, perforation of the septum, change in the external  appearance of the nose, change in the sense of smell, chronic crusting and  drying, numbness of the teeth, and risk of the anesthetic.  All questions  were answered, and consent was obtained.   OPERATION:  The patient was taken to the operating room and placed in the  supine position, after adequate general endotracheal tube anesthesia was  prepped and draped in the usual sterile manner.  Oxymetazoline pledges were  placed in the nose bilaterally and the septum and inferior turbinates were  injected with 1% lidocaine with 1:100,000 epinephrine.  The left  hemitransfixion incision was performed, raising a mucoperichondrial and  osteal flap.  The cartilage was divided about 2 cm posterior to the caudal  strut, and this cartilage was removed.  A flap was elevated out to the side  and the deviated portion of the bone and remaining  cartilage posteriorly  were removed with a Jansen-Middleton forceps.  The inferior spur was  removed.  This corrected the septal deflection.  The turbinates were  infractured.  A midline incision made with a 15 blade, mucosal flap elevated  superiorly, and the inferior mucosa and bone were removed with the turbinate  scissors.  The edge was cauterized with suction cautery and both flaps were  laid back down over the raw surface and both turbinates outfractured.  The  hemitransfixion incision closed with  interrupted 4-0 chromic and 4-0 plain gut quilting stitch placed to the  septum.  The Telfa rolls soaked in bacitracin were placed into the nose  bilaterally and secured with a 3-0 nylon.  Oral cavity and oropharynx were  suctioned out of all blood and debris.  The patient was awakened and brought  to recovery in stable condition, counts correct.       JB/MEDQ  D:  08/01/2004  T:  08/01/2004  Job:  [id number]   cc:   Hal T. Stoneking, M.D.  [address] [place], [place] [address]  Fax: [phone]

## 2011-04-06 ENCOUNTER — Encounter: Payer: Self-pay | Admitting: Pulmonary Disease

## 2011-04-07 ENCOUNTER — Ambulatory Visit (INDEPENDENT_AMBULATORY_CARE_PROVIDER_SITE_OTHER)
Admission: RE | Admit: 2011-04-07 | Discharge: 2011-04-07 | Disposition: A | Discharge status: Home / Self Care | Payer: Medicare Other | Source: Ambulatory Visit | Attending: Internal Medicine | Admitting: Internal Medicine

## 2011-04-07 ENCOUNTER — Ambulatory Visit (INDEPENDENT_AMBULATORY_CARE_PROVIDER_SITE_OTHER): Payer: Medicare Other | Admitting: Internal Medicine

## 2011-04-07 ENCOUNTER — Encounter: Payer: Self-pay | Admitting: Internal Medicine

## 2011-04-07 VITALS — BP 110/70 | HR 79 | Wt 168.8 lb

## 2011-04-07 DIAGNOSIS — G4733 Obstructive sleep apnea (adult) (pediatric): Secondary | ICD-10-CM

## 2011-04-07 DIAGNOSIS — J449 Chronic obstructive pulmonary disease, unspecified: Secondary | ICD-10-CM

## 2011-04-07 DIAGNOSIS — R059 Cough, unspecified: Secondary | ICD-10-CM

## 2011-04-07 DIAGNOSIS — F17200 Nicotine dependence, unspecified, uncomplicated: Secondary | ICD-10-CM

## 2011-04-07 DIAGNOSIS — R05 Cough: Secondary | ICD-10-CM

## 2011-04-12 ENCOUNTER — Encounter: Payer: Self-pay | Admitting: Internal Medicine

## 2011-04-12 NOTE — Patient Instructions (Signed)
Order- CXR  Dx cough, COPD

## 2011-04-12 NOTE — Progress Notes (Signed)
  Subjective:    Patient ID: Brian [name], male    DOB: 04/05/1941, 70 y.o.   MRN: [id number]  HPI 04/07/2011-62 year old active smoker up to one pack per day, followed for history of cough, tobacco use, OSA, complicated by CAD/Hx MI One-year followup. He feels he has been doing well and has remained stable. Mild occasional cough. He paces himself to do physical activity almost every day such as lawnmowing. He has made no effort to stop smoking despite repeated counseling.  Review of Systems Constitutional:   No-   weight loss, night sweats, fevers, chills, fatigue, lassitude. HEENT:   No-  headaches, difficulty swallowing, tooth/dental problems, sore throat,       No-  sneezing, itching, ear ache, nasal congestion, post nasal drip,  CV:  No-   chest pain, orthopnea, PND, swelling in lower extremities, anasarca, dizziness, palpitations Resp: No-   shortness of breath with exertion or at rest.              No-   productive cough,  minimal intermittent non-productive cough,  No-  coughing up of blood.              No-   change in color of mucus.  No- wheezing.   Skin: No-   rash or lesions. GI:  No-   heartburn, indigestion, abdominal pain, nausea, vomiting, diarrhea,                 change in bowel habits, loss of appetite GU: No-   dysuria, change in color of urine, no urgency or frequency.  No- flank pain. MS:  No-   joint pain or swelling.  No- decreased range of motion.  No- back pain. Neuro- grossly normal to observation, Or:  Psych:  No- change in mood or affect. No depression or anxiety.  No memory loss.      Objective:   Physical Exam General- Alert, Oriented, Affect-appropriate, Distress- none acute; slender, well-appearing, tan. Skin- rash-none, lesions- none, excoriation- none Lymphadenopathy- none Head- atraumatic            Eyes- Gross vision intact, PERRLA, conjunctivae clear secretions            Ears- Hearing, canals normal            Nose- Clear, no-Septal dev,  mucus, polyps, erosion, perforation             Throat- Mallampati II-III , mucosa clear , drainage- none, tonsils- atrophic Neck- flexible , trachea midline, no stridor , thyroid nl, carotid no bruit Chest - symmetrical excursion , unlabored           Heart/CV- RRR , no murmur , no gallop  , no rub, nl s1 s2                           - JVD- none , edema- none, stasis changes- none, varices- none           Lung- clear to P&A, maybe diminished, unlabored;  wheeze- none, cough- none , dullness-none, rub- none           Chest wall-  Abd- tender-no, distended-no, bowel sounds-present, HSM- no Br/ Gen/ Rectal- Not done, not indicated Extrem- cyanosis- none, clubbing, none, atrophy- none, strength- nl Neuro- grossly intact to observation          Assessment & Plan:

## 2011-04-12 NOTE — Assessment & Plan Note (Signed)
We will update chest x-ray. He has a mild intermittent bronchitis which he doesn't feel or find limiting I have again counseled him on smoking cessation, available supports and techniques.

## 2011-05-07 LAB — BASIC METABOLIC PANEL
CO2: 28
Chloride: 105
Creatinine, Ser: 0.94
GFR calc Af Amer: >60
Potassium: 5.3 — ABNORMAL HIGH

## 2011-05-07 LAB — CBC
HCT: 45.2
MCHC: 33.4
MCV: 88.4
RBC: 5.12
WBC: 7.4

## 2012-04-05 ENCOUNTER — Ambulatory Visit: Payer: Medicare Other | Admitting: Internal Medicine

## 2012-04-26 ENCOUNTER — Other Ambulatory Visit: Payer: Self-pay | Admitting: Gastroenterology

## 2012-05-18 ENCOUNTER — Encounter: Payer: Self-pay | Admitting: Internal Medicine

## 2012-05-18 ENCOUNTER — Ambulatory Visit (INDEPENDENT_AMBULATORY_CARE_PROVIDER_SITE_OTHER): Payer: Medicare Other | Admitting: Internal Medicine

## 2012-05-18 ENCOUNTER — Ambulatory Visit (INDEPENDENT_AMBULATORY_CARE_PROVIDER_SITE_OTHER)
Admission: RE | Admit: 2012-05-18 | Discharge: 2012-05-18 | Disposition: A | Discharge status: Home / Self Care | Payer: Medicare Other | Source: Ambulatory Visit | Attending: Internal Medicine | Admitting: Internal Medicine

## 2012-05-18 VITALS — BP 110/64 | HR 71 | Ht 70.0 in | Wt 174.6 lb

## 2012-05-18 DIAGNOSIS — R05 Cough: Secondary | ICD-10-CM

## 2012-05-18 DIAGNOSIS — G4730 Sleep apnea, unspecified: Secondary | ICD-10-CM

## 2012-05-18 DIAGNOSIS — G4710 Hypersomnia, unspecified: Secondary | ICD-10-CM

## 2012-05-18 DIAGNOSIS — J40 Bronchitis, not specified as acute or chronic: Secondary | ICD-10-CM

## 2012-05-18 DIAGNOSIS — F17200 Nicotine dependence, unspecified, uncomplicated: Secondary | ICD-10-CM

## 2012-05-18 NOTE — Patient Instructions (Addendum)
Sample Spiriva- use one daily till sample used up. See if it helps the cough and chest tightness  Order- CXR   Dx cough, bronchitis  I continue to recommend that you stop smoking. It will save money and take a load off your heart and lungs

## 2012-05-18 NOTE — Progress Notes (Signed)
Subjective:    Patient ID: Brian [name], male    DOB: [birth year], 71 y.o.   MRN: [id number]  HPI 04/07/2011-59 year old active smoker up to one pack per day, followed for history of cough, tobacco use, OSA, complicated by CAD/Hx MI One-year followup. He feels he has been doing well and has remained stable. Mild occasional cough. He paces himself to do physical activity almost every day such as lawnmowing. He has made no effort to stop smoking despite repeated counseling.  05/18/12- 66 year old active smoker up to one pack per day, followed for history of cough, tobacco use, OSA, complicated by CAD/Hx MI Past week having more  of a dry cough now. Usually will be productive; wakes up in mid of night with cough. Wears CPAP every night Still smoking with no effort to quit. Normal PFT 04/08/2010. He paces himself to mow his lawn. Rarely wheezes. Has had flu vaccine CXR 04/06/12 IMPRESSION:  No active cardiopulmonary disease.  Original Report Authenticated By: Donavan Burnet, M.D.   Review of Systems Constitutional:   No-   weight loss, night sweats, fevers, chills, fatigue, lassitude. HEENT:   No-  headaches, difficulty swallowing, tooth/dental problems, sore throat,       No-  sneezing, itching, ear ache, nasal congestion, post nasal drip,  CV:  No-   chest pain, orthopnea, PND, swelling in lower extremities, anasarca, dizziness, palpitations Resp: + Minor  shortness of breath with exertion or at rest.              No-   productive cough,  minimal intermittent non-productive cough,  No-  coughing up of blood.              No-   change in color of mucus.  + Rare wheezing.   Skin: No-   rash or lesions. GI:  No-   heartburn, indigestion, abdominal pain, nausea, vomiting,  GU: n. MS:  No-   joint pain or swelling.  . Neuro- nothing unusual  Psych:  No- change in mood or affect. No depression or anxiety.  No memory loss.  Objective:   Physical Exam General- Alert, Oriented,  Affect-appropriate, Distress- none acute; slender, well-appearing, tan.+ Odor of tobacco Skin- rash-none, lesions- none, excoriation- none. Normal skin color is dark. Lymphadenopathy- none Head- atraumatic            Eyes- Gross vision intact, PERRLA, conjunctivae clear secretions            Ears- Hearing, canals normal            Nose- Clear, no-Septal dev, mucus, polyps, erosion, perforation             Throat- Mallampati II-III , mucosa clear , drainage- none, tonsils- atrophic Neck- flexible , trachea midline, no stridor , thyroid nl, carotid no bruit Chest - symmetrical excursion , unlabored           Heart/CV- RRR , no murmur , no gallop  , no rub, nl s1 s2                           - JVD- none , edema- none, stasis changes- none, varices- none           Lung- clear to P&A, maybe diminished, unlabored;  wheeze- none, + light cough , dullness-none, rub- none           Chest wall-  Abd-  Br/ Gen/ Rectal- Not done, not indicated  Extrem- cyanosis- none, clubbing, none, atrophy- none, strength- nl Neuro- grossly intact to observation  Assessment & Plan:

## 2012-05-29 NOTE — Assessment & Plan Note (Signed)
Good compliance and control. 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

## 2012-05-29 NOTE — Assessment & Plan Note (Signed)
Very mild bronchitis. Plan-emphasis on smoking cessation. Sample Spiriva

## 2012-05-29 NOTE — Assessment & Plan Note (Signed)
He is making no effort to stop on his own but is not allowed to smoke when he visits his wife at their house in [place].

## 2012-06-02 NOTE — Progress Notes (Signed)
Quick Note:  Pt aware of results. ______ 

## 2012-08-15 ENCOUNTER — Other Ambulatory Visit: Payer: Self-pay | Admitting: Geriatric Medicine

## 2012-08-15 DIAGNOSIS — M549 Dorsalgia, unspecified: Secondary | ICD-10-CM

## 2012-08-16 ENCOUNTER — Ambulatory Visit
Admission: RE | Admit: 2012-08-16 | Discharge: 2012-08-16 | Disposition: A | Discharge status: Home / Self Care | Payer: Medicare Other | Source: Ambulatory Visit | Attending: Geriatric Medicine | Admitting: Geriatric Medicine

## 2012-08-16 DIAGNOSIS — M549 Dorsalgia, unspecified: Secondary | ICD-10-CM

## 2012-08-17 ENCOUNTER — Other Ambulatory Visit: Payer: Medicare Other

## 2012-08-18 ENCOUNTER — Other Ambulatory Visit: Payer: Self-pay | Admitting: Geriatric Medicine

## 2012-08-18 DIAGNOSIS — R29898 Other symptoms and signs involving the musculoskeletal system: Secondary | ICD-10-CM

## 2012-08-21 ENCOUNTER — Ambulatory Visit
Admission: RE | Admit: 2012-08-21 | Discharge: 2012-08-21 | Disposition: A | Discharge status: Home / Self Care | Payer: Medicare Other | Source: Ambulatory Visit | Attending: Geriatric Medicine | Admitting: Geriatric Medicine

## 2012-08-21 DIAGNOSIS — R29898 Other symptoms and signs involving the musculoskeletal system: Secondary | ICD-10-CM

## 2012-08-21 MED ORDER — GADOBENATE DIMEGLUMINE 529 MG/ML IV SOLN
17.0000 mL | Freq: Once | INTRAVENOUS | Status: AC | PRN
Start: 2012-08-21 — End: 2012-08-21
  Administered 2012-08-21: 17 mL via INTRAVENOUS

## 2013-05-18 ENCOUNTER — Ambulatory Visit (INDEPENDENT_AMBULATORY_CARE_PROVIDER_SITE_OTHER): Payer: Medicare Other | Admitting: Internal Medicine

## 2013-05-18 ENCOUNTER — Encounter: Payer: Self-pay | Admitting: Internal Medicine

## 2013-05-18 ENCOUNTER — Ambulatory Visit (INDEPENDENT_AMBULATORY_CARE_PROVIDER_SITE_OTHER)
Admission: RE | Admit: 2013-05-18 | Discharge: 2013-05-18 | Disposition: A | Discharge status: Home / Self Care | Payer: Medicare Other | Source: Ambulatory Visit | Attending: Internal Medicine | Admitting: Internal Medicine

## 2013-05-18 VITALS — BP 138/80 | HR 73 | Ht 70.0 in | Wt 164.0 lb

## 2013-05-18 DIAGNOSIS — R0600 Dyspnea, unspecified: Secondary | ICD-10-CM

## 2013-05-18 DIAGNOSIS — Z23 Encounter for immunization: Secondary | ICD-10-CM

## 2013-05-18 DIAGNOSIS — F17200 Nicotine dependence, unspecified, uncomplicated: Secondary | ICD-10-CM

## 2013-05-18 DIAGNOSIS — I219 Acute myocardial infarction, unspecified: Secondary | ICD-10-CM

## 2013-05-18 DIAGNOSIS — R0609 Other forms of dyspnea: Secondary | ICD-10-CM

## 2013-05-18 DIAGNOSIS — R0989 Other specified symptoms and signs involving the circulatory and respiratory systems: Secondary | ICD-10-CM

## 2013-05-18 DIAGNOSIS — G4710 Hypersomnia, unspecified: Secondary | ICD-10-CM

## 2013-05-18 NOTE — Progress Notes (Signed)
Quick Note:  Advised pt of cxr results per CY. Pt verbalized understanding ______

## 2013-05-18 NOTE — Patient Instructions (Signed)
Try lowering your CPAP machine relative to the height of your bed, so water will drain back out of the hose and you can set the humidifier up to avoid dryness  Consider also trying otc Biotene for dry mouth  Please keep trying to stop smoking  Order- schedule PFT   Dx dyspnea with exertion  Order- CXR- dx dyspnea, tobacco user  Prevnar 13 pneumococcal vaccine

## 2013-05-18 NOTE — Progress Notes (Signed)
Subjective:    Patient ID: Brian [name], male    DOB: 02-01-1941, 72 y.o.   MRN: [id number]  HPI 04/07/2011-30 year old active smoker up to one pack per day, followed for history of cough, tobacco use, OSA, complicated by CAD/Hx MI One-year followup. He feels he has been doing well and has remained stable. Mild occasional cough. He paces himself to do physical activity almost every day such as lawnmowing. He has made no effort to stop smoking despite repeated counseling.  05/18/12- 47 year old active smoker up to one pack per day, followed for history of cough, tobacco use, OSA, complicated by CAD/Hx MI Past week having more  of a dry cough now. Usually will be productive; wakes up in mid of night with cough. Wears CPAP every night Still smoking with no effort to quit. Normal PFT 04/08/2010. He paces himself to mow his lawn. Rarely wheezes. Has had flu vaccine CXR 04/06/12 IMPRESSION:  No active cardiopulmonary disease.  Original Report Authenticated By: Donavan Burnet, M.D.   05/18/13-  71 year old active smoker up to one pack per day, followed for history of cough, tobacco use, OSA, complicated by CAD/Hx MI FOLLOWS FOR:  Wearing CPAP/ Advanced 6 hours per night.  Increased SOB w/ exertion x2 months He has moved to [facility] to live with his wife. She won't let him smoke in the house. Has had flu shot. Exercise reduced by spinal stenosis. Occasional morning cough, nonproductive. Denies chest pain, palpitation, edema. CXR 05/18/12 IMPRESSION:  No evidence of acute cardiopulmonary disease.  Original Report Authenticated By: Charline Bills, M.D   Review of Systems- see HPI Constitutional:   No-   weight loss, night sweats, fevers, chills, fatigue, lassitude. HEENT:   No-  headaches, difficulty swallowing, tooth/dental problems, sore throat,       No-  sneezing, itching, ear ache, nasal congestion, post nasal drip,  CV:  No-   chest pain, orthopnea, PND, swelling in lower  extremities, anasarca, dizziness, palpitations Resp: + Minor  shortness of breath with exertion or at rest.              No-   productive cough,  minimal intermittent non-productive cough,  No-  coughing up of blood.              No-   change in color of mucus.  + Rare wheezing.   Skin: No-   rash or lesions. GI:  No-   heartburn, indigestion, abdominal pain, nausea, vomiting,  GU: n. MS:  No-   joint pain or swelling.  . Neuro- nothing unusual  Psych:  No- change in mood or affect. No depression or anxiety.  No memory loss.  Objective:   Physical Exam General- Alert, Oriented, Affect-appropriate, Distress- none acute; slender, well-appearing Skin- rash-none, lesions- none, excoriation- none. Normal skin color is dark. Lymphadenopathy- none Head- atraumatic            Eyes- Gross vision intact, PERRLA, conjunctivae clear secretions            Ears- Hearing, canals normal            Nose- Clear, no-Septal dev, mucus, polyps, erosion, perforation             Throat- Mallampati II-III , mucosa clear , drainage- none, tonsils- atrophic Neck- flexible , trachea midline, no stridor , thyroid nl, carotid no bruit Chest - symmetrical excursion , unlabored           Heart/CV- RRR , no murmur ,  no gallop  , no rub, nl s1 s2                           - JVD- none , edema- none, stasis changes- none, varices- none           Lung- clear to P&A, maybe diminished, unlabored;  wheeze- none,  Cough-none , dullness-none, rub- none           Chest wall-  Abd-  Br/ Gen/ Rectal- Not done, not indicated Extrem- cyanosis- none, clubbing, none, atrophy- none, strength- nl Neuro- grossly intact to observation  Assessment & Plan:

## 2013-05-23 ENCOUNTER — Ambulatory Visit (HOSPITAL_COMMUNITY)
Admission: RE | Admit: 2013-05-23 | Discharge: 2013-05-23 | Disposition: A | Discharge status: Home / Self Care | Payer: Medicare Other | Source: Ambulatory Visit | Attending: Internal Medicine | Admitting: Internal Medicine

## 2013-05-23 DIAGNOSIS — R0609 Other forms of dyspnea: Secondary | ICD-10-CM | POA: Insufficient documentation

## 2013-05-23 DIAGNOSIS — F17200 Nicotine dependence, unspecified, uncomplicated: Secondary | ICD-10-CM | POA: Insufficient documentation

## 2013-05-23 DIAGNOSIS — R0600 Dyspnea, unspecified: Secondary | ICD-10-CM

## 2013-05-23 DIAGNOSIS — R0989 Other specified symptoms and signs involving the circulatory and respiratory systems: Secondary | ICD-10-CM | POA: Insufficient documentation

## 2013-05-23 LAB — PULMONARY FUNCTION TEST

## 2013-05-23 MED ORDER — ALBUTEROL SULFATE (5 MG/ML) 0.5% IN NEBU
2.5000 mg | INHALATION_SOLUTION | Freq: Once | RESPIRATORY_TRACT | Status: AC
  Administered 2013-05-23: 2.5 mg via RESPIRATORY_TRACT

## 2013-05-29 ENCOUNTER — Encounter: Payer: Self-pay | Admitting: Interventional Cardiology

## 2013-06-04 ENCOUNTER — Encounter: Payer: Self-pay | Admitting: Internal Medicine

## 2013-06-04 NOTE — Assessment & Plan Note (Signed)
I expect smoking to catch up with him and I explained this. Support offered. Plan-schedule PFT, chest x-ray, pneumococcal conjugate vaccine-13

## 2013-06-04 NOTE — Assessment & Plan Note (Signed)
Denies ischemia symptoms. I pressured him to stop smoking

## 2013-06-04 NOTE — Assessment & Plan Note (Signed)
Good compliance and control by his report. Plan- Query Advanced for his pressure setting

## 2013-06-28 ENCOUNTER — Encounter (INDEPENDENT_AMBULATORY_CARE_PROVIDER_SITE_OTHER): Payer: Self-pay

## 2013-06-28 ENCOUNTER — Encounter: Payer: Self-pay | Admitting: Internal Medicine

## 2013-07-17 ENCOUNTER — Ambulatory Visit: Payer: Self-pay | Admitting: Ophthalmology

## 2013-07-24 ENCOUNTER — Ambulatory Visit: Payer: Self-pay | Admitting: Ophthalmology

## 2013-07-29 ENCOUNTER — Encounter: Payer: Self-pay | Admitting: Internal Medicine

## 2013-07-31 NOTE — Telephone Encounter (Signed)
Please advise on PFT results. Results printed.Fajardo Bing, CMA

## 2013-07-31 NOTE — Telephone Encounter (Signed)
PFT was not given to me to read so I did not have the report- sorry for the delay.  Airflow and volume of lung tissue seem normal.  The diffusion capacity, which measures oxygen exchange, is low. This may be from emphysema, but changes in blood flow through the lung can also do it. Unless something acute happens, we can watch this for now. If shortness of breath gets worse, we should get him in earlier than currently scheduled.

## 2013-08-03 ENCOUNTER — Encounter: Payer: Self-pay | Admitting: Interventional Cardiology

## 2013-08-03 ENCOUNTER — Ambulatory Visit (INDEPENDENT_AMBULATORY_CARE_PROVIDER_SITE_OTHER): Payer: Medicare Other | Admitting: Interventional Cardiology

## 2013-08-03 VITALS — BP 130/90 | HR 62 | Ht 70.0 in | Wt 164.0 lb

## 2013-08-03 DIAGNOSIS — I2510 Atherosclerotic heart disease of native coronary artery without angina pectoris: Secondary | ICD-10-CM

## 2013-08-03 DIAGNOSIS — E785 Hyperlipidemia, unspecified: Secondary | ICD-10-CM

## 2013-08-03 DIAGNOSIS — I252 Old myocardial infarction: Secondary | ICD-10-CM

## 2013-08-03 DIAGNOSIS — F17200 Nicotine dependence, unspecified, uncomplicated: Secondary | ICD-10-CM

## 2013-08-03 NOTE — Progress Notes (Signed)
Patient ID: Brian [name], male   DOB: 1940/12/29, 73 y.o.   MRN: [id number] Past Medical History  Coronary artery disease, treated with angioplasty/PTC RA, 1998   Tobacco use   obstructive sleep apnea- sleep apnea CPAP   Hyperlipidemia   Carpal tunnel ? left   Cervical spine disc disease   lumbar disc protrussion Dr Rhona Raider epidurals 03/2010   pulmonology Dr Annamaria Boots   ultrasound negative for AAA 09/2011   Hypertension   tubular adenomatous polyp 04/2012-Dr. Wynetta Emery   ortho Dr Rhona Raider   low back pain, Dr Rhona Raider, Dr Mina Marble   possible ruptured disc left L3/L4 with for a minimal narrowing however symptoms are on the right 07/2012 proceed with MRI of brain normal 08/2012      [address], [address], [place]  [address] Phone: [phone] Fax:  [phone]  Date:  08/03/2013   ID:  Brian [name], DOB [birth year], MRN [id number]  PCP:  Mathews Argyle, MD   ASSESSMENT:  1. Coronary artery disease with no active symptoms. Patient has a history of prior anterior myocardial infarction 1998 treated with PTCA 2. Hypertension under adequate control 3. Hyperlipidemia on therapy including atorvastatin 40 mg per day 4. Continued tobacco use 5. Sleep apnea  PLAN:  1. I strongly recommended smoking cessation. I recommended that he enroll in the [facility] smoking cessation class the next time it is offered 2. Lipids are managed by his primary care physician 3. I encouraged him to maintain an active lifestyle 4. Clinical followup in one year. No functional testing is needed at this time   SUBJECTIVE: Brian [name] is a 73 y.o. male is doing well from cardiac standpoint. His major debility at this time his sciatica/lumbar disc disease. He is a continuous discomfort. He is retired from work. He now lives in [place]. He has not experienced any angina. He denies dyspnea. He is concerned about his continued smoking habit. He is desirous of smoking  cessation.   Wt Readings from Last 3 Encounters:  08/03/13 164 lb (74.39 kg)  05/18/13 164 lb (74.39 kg)  05/18/12 174 lb 9.6 oz (79.198 kg)     Past Medical History  Diagnosis Date  . Sleep apnea   . Myocardial infarction 1998    Current Outpatient Prescriptions  Medication Sig Dispense Refill  . aspirin 81 MG tablet Take 81 mg by mouth daily.        Marland Kitchen atorvastatin (LIPITOR) 40 MG tablet Take 40 mg by mouth daily.        . Coenzyme Q10 (COQ10) 100 MG CAPS Take 1 capsule by mouth daily.        . Multiple Minerals-Vitamins (CITRACAL PLUS BONE DENSITY) TABS Take 1 tablet by mouth 2 (two) times daily.        . multivitamin (THERAGRAN) per tablet Take 1 tablet by mouth daily.        Marland Kitchen oxyCODONE-acetaminophen (PERCOCET) 10-325 MG per tablet 1 tablet every 6 (six) hours as needed.      . quinapril (ACCUPRIL) 10 MG tablet Take 10 mg by mouth at bedtime.         No current facility-administered medications for this visit.    Allergies:   No Known Allergies  Social History:  The patient  reports that he has been smoking Cigarettes.  He has a 25 pack-year smoking history. He does not have any smokeless tobacco history on file. He reports that he drinks alcohol.   ROS:  Please see the history of present illness.    All other systems reviewed and negative.   OBJECTIVE: VS:  BP 130/90  Pulse 62  Ht $R'5\' 10"'WR$  (1.778 m)  Wt 164 lb (74.39 kg)  BMI 23.53 kg/m2 Well nourished, well developed, in no acute distress HEENT: normal Neck: JVD flat. Carotid bruit absent  Cardiac:  normal S1, S2; RRR; no murmur Lungs:  clear to auscultation bilaterally, no wheezing, rhonchi or rales Abd: soft, nontender, no hepatomegaly Ext: Edema  Absent . Pulses 2+ and symmetric  Skin: warm and dry Neuro:  CNs 2-12 intact, no focal abnormalities noted  EKG:  Normal sinus rhythm with left axis deviation and QS pattern V1 and V2       Signed, Illene Labrador III, MD 08/03/2013 2:36 PM

## 2013-08-03 NOTE — Patient Instructions (Signed)
Your physician recommends that you continue on your current medications as directed. Please refer to the Current Medication list given to you today.  Your physician wants you to follow-up in: 1 year You will receive a reminder letter in the mail two months in advance. If you don't receive a letter, please call our office to schedule the follow-up appointment.   Your physician discussed the hazards of tobacco use. Tobacco use cessation is recommended and techniques and options to help you quit were discussed. Please call [facility] Cessation program [phone] to enroll in their next session

## 2013-08-15 ENCOUNTER — Ambulatory Visit: Payer: Medicare Other | Admitting: Interventional Cardiology

## 2013-08-16 ENCOUNTER — Ambulatory Visit: Payer: Medicare Other | Admitting: Interventional Cardiology

## 2013-10-23 ENCOUNTER — Ambulatory Visit: Payer: Self-pay | Admitting: Ophthalmology

## 2014-02-26 ENCOUNTER — Emergency Department: Payer: Self-pay | Admitting: Emergency Medicine

## 2014-02-26 LAB — COMPREHENSIVE METABOLIC PANEL
ALBUMIN: 3.5 g/dL (ref 3.4–5.0)
Alkaline Phosphatase: 91 U/L
Anion Gap: 6 — ABNORMAL LOW (ref 7–16)
BUN: 12 mg/dL (ref 7–18)
Bilirubin,Total: 0.6 mg/dL (ref 0.2–1.0)
CHLORIDE: 106 mmol/L (ref 98–107)
Calcium, Total: 8.9 mg/dL (ref 8.5–10.1)
Co2: 26 mmol/L (ref 21–32)
Creatinine: 1.07 mg/dL (ref 0.60–1.30)
EGFR (African American): >60
Glucose: 83 mg/dL (ref 65–99)
Osmolality: 275 (ref 275–301)
Potassium: 4.7 mmol/L (ref 3.5–5.1)
SGOT(AST): 23 U/L (ref 15–37)
SGPT (ALT): 26 U/L
SODIUM: 138 mmol/L (ref 136–145)
TOTAL PROTEIN: 6.9 g/dL (ref 6.4–8.2)

## 2014-02-26 LAB — CBC
HCT: 42.2 % (ref 40.0–52.0)
HGB: 13.6 g/dL (ref 13.0–18.0)
MCH: 29.4 pg (ref 26.0–34.0)
MCHC: 32.2 g/dL (ref 32.0–36.0)
MCV: 91 fL (ref 80–100)
PLATELETS: 221 10*3/uL (ref 150–440)
RBC: 4.63 10*6/uL (ref 4.40–5.90)
RDW: 13.6 % (ref 11.5–14.5)
WBC: 11.4 10*3/uL — ABNORMAL HIGH (ref 3.8–10.6)

## 2014-02-26 LAB — TROPONIN I: Troponin-I: 0.02 ng/mL

## 2014-02-26 LAB — CK TOTAL AND CKMB (NOT AT ARMC)
CK, TOTAL: 105 U/L
CK-MB: 1.9 ng/mL (ref 0.5–3.6)

## 2014-02-27 ENCOUNTER — Telehealth: Payer: Self-pay | Admitting: Interventional Cardiology

## 2014-02-27 DIAGNOSIS — R42 Dizziness and giddiness: Secondary | ICD-10-CM

## 2014-02-27 DIAGNOSIS — R55 Syncope and collapse: Secondary | ICD-10-CM

## 2014-02-27 NOTE — Telephone Encounter (Signed)
returned pt call. pt sts that he was seen @ [facility] yesterday for a near syncope episode. pt sts that he was doing ligh yard work when he bwgan to feel dizzy and began to sweat heavily.pt sts that episode lasted 20-30 min.pt sts that had labs and an ekg done @[facility]  that were normal. He was adv to f/u with his cardiologist. Pt sts that he has felt fine since and has not had a recurring episode. Pt denies chest pain,sob, swelling.pt sts that he spoke with his pcp Dr.Stoneking who recommended his f/u with Dr.Smith. Adv pt that Dr.Smith will not be back in the office until the end of Aug.appt sch for 8/31. Adv pt if cardiac symptoms develop to go to the ED.adv pt I will fwd him a message Dr.Smith an update and call back if he has additional recommendation.pt agreeable and verbalized understanding

## 2014-02-27 NOTE — Telephone Encounter (Signed)
F/u    Pt waiting on return call from nurse.

## 2014-02-27 NOTE — Telephone Encounter (Signed)
New message           Pt was seen at [facility] would like pt to be seen by Dr Tamala Julian asap / please give pt a call

## 2014-02-28 NOTE — Telephone Encounter (Signed)
Needs to wear cardiac monitor for 30 days

## 2014-03-01 ENCOUNTER — Telehealth: Payer: Self-pay | Admitting: Interventional Cardiology

## 2014-03-01 NOTE — Addendum Note (Signed)
Addended by: Lamar Laundry on: 03/01/2014 01:31 PM   Modules accepted: Orders

## 2014-03-01 NOTE — Telephone Encounter (Signed)
ROI faxed to [facility] @ [phone]   8.13.15/km

## 2014-03-01 NOTE — Telephone Encounter (Signed)
pt aware of Dr.Smith recommendation for pt to wear a 30 day cardiac monitor. pt adv that a scheduler will call him to sch appt for monitor. pt agreeable with plan and verbalized understanding.

## 2014-03-06 ENCOUNTER — Encounter: Payer: Self-pay | Admitting: *Deleted

## 2014-03-06 ENCOUNTER — Encounter (INDEPENDENT_AMBULATORY_CARE_PROVIDER_SITE_OTHER): Payer: Medicare Other

## 2014-03-06 DIAGNOSIS — R55 Syncope and collapse: Secondary | ICD-10-CM

## 2014-03-06 DIAGNOSIS — R42 Dizziness and giddiness: Secondary | ICD-10-CM

## 2014-03-06 NOTE — Progress Notes (Signed)
Patient ID: Brian [name], male   DOB: Jun 01, 1941, 73 y.o.   MRN: [id number] [employer] 30 day cardiac event monitor applied to patient.

## 2014-03-19 ENCOUNTER — Ambulatory Visit (INDEPENDENT_AMBULATORY_CARE_PROVIDER_SITE_OTHER): Payer: Medicare Other | Admitting: Interventional Cardiology

## 2014-03-19 ENCOUNTER — Encounter: Payer: Self-pay | Admitting: Interventional Cardiology

## 2014-03-19 VITALS — BP 112/82 | HR 58 | Ht 69.5 in | Wt 162.0 lb

## 2014-03-19 DIAGNOSIS — R55 Syncope and collapse: Secondary | ICD-10-CM

## 2014-03-19 DIAGNOSIS — F17200 Nicotine dependence, unspecified, uncomplicated: Secondary | ICD-10-CM

## 2014-03-19 DIAGNOSIS — E785 Hyperlipidemia, unspecified: Secondary | ICD-10-CM

## 2014-03-19 DIAGNOSIS — I252 Old myocardial infarction: Secondary | ICD-10-CM

## 2014-03-19 DIAGNOSIS — I2510 Atherosclerotic heart disease of native coronary artery without angina pectoris: Secondary | ICD-10-CM

## 2014-03-19 NOTE — Progress Notes (Signed)
Patient ID: Brian [name], male   DOB: [birth year], 73 y.o.   MRN: [id number]    [address], [address], [place]  [address] Phone: [phone] Fax:  [phone]  Date:  03/19/2014   ID:  Brian [name], DOB [birth year], MRN [id number]  PCP:  Mathews Argyle, MD   ASSESSMENT:  1. Near syncope with associated diaphoresis. Likely vasovagal 2. Remote anterior myocardial infarction 3. Weakness and diaphoresis similar to symptoms that occurred during MI 4. Coronary artery disease without specific anginal complaints. Rule out unexplained diaphoresis as an anginal equivalent  PLAN:  1. Complete 30 day continuous monitor 2. Exercise treadmill test   SUBJECTIVE: Brian [name] is a 73 y.o. male 2 weeks ago had diffuse sweating weakness and near syncope. There is no associated chest discomfort or dyspnea. He denied palpitations. He has been wearing a continuous monitor now for 2 weeks without arrhythmia noted. No recurrent symptoms. His MI in 1998 was associated with severe diaphoresis but chest pain was also present.   Wt Readings from Last 3 Encounters:  03/19/14 162 lb (73.483 kg)  08/03/13 164 lb (74.39 kg)  05/18/13 164 lb (74.39 kg)     Past Medical History  Diagnosis Date  . Sleep apnea   . Myocardial infarction 1998  . Coronary artery disease   . Hyperlipidemia   . Carpal tunnel syndrome on left   . Cervical spine disease   . Hypertension   . Adenomatous polyp   . Low back pain     Current Outpatient Prescriptions  Medication Sig Dispense Refill  . aspirin 81 MG tablet Take 81 mg by mouth daily.        Marland Kitchen atorvastatin (LIPITOR) 40 MG tablet Take 40 mg by mouth daily.        . Coenzyme Q10 (COQ10) 100 MG CAPS Take 1 capsule by mouth daily.        . Multiple Minerals-Vitamins (CITRACAL PLUS BONE DENSITY) TABS Take 1 tablet by mouth 2 (two) times daily.        . multivitamin (THERAGRAN) per tablet Take 1 tablet by mouth daily.        . nitroGLYCERIN  (NITROSTAT) 0.4 MG SL tablet Place 0.4 mg under the tongue every 5 (five) minutes as needed for chest pain.      Marland Kitchen oxyCODONE-acetaminophen (PERCOCET) 10-325 MG per tablet 1 tablet every 6 (six) hours as needed.      . quinapril (ACCUPRIL) 10 MG tablet Take 10 mg by mouth at bedtime.        . zaleplon (SONATA) 10 MG capsule Take by mouth.       No current facility-administered medications for this visit.    Allergies:   No Known Allergies  Social History:  The patient  reports that he has been smoking Cigarettes.  He has a 25 pack-year smoking history. He does not have any smokeless tobacco history on file. He reports that he drinks alcohol.   ROS:  Please see the history of present illness.   No episodes of recurrent near-syncope. One episode of syncope many years ago. Denies exertional intolerance.   All other systems reviewed and negative.   OBJECTIVE: VS:  BP 112/82  Pulse 58  Ht 5' 9.5" (1.765 m)  Wt 162 lb (73.483 kg)  BMI 23.59 kg/m2 Well nourished, well developed, in no acute distress, healthy HEENT: normal Neck: JVD flat. Carotid bruit absent  Cardiac:  normal S1, S2; RRR; no murmur Lungs:  clear to auscultation bilaterally, no wheezing, rhonchi or rales Abd: soft, nontender, no hepatomegaly Ext: Edema absent. Pulses 2+ Skin: warm and dry Neuro:  CNs 2-12 intact, no focal abnormalities noted  EKG:  Not repeated. At [facility] the EKGs that were done did not reveal acute changes according to report       Signed, Illene Labrador III, MD 03/19/2014 3:53 PM

## 2014-03-19 NOTE — Patient Instructions (Addendum)
Your physician has requested that you have an exercise tolerance test. For further information please visit [email or web address]. Please also follow instruction sheet, as given.  Your physician wants you to follow-up in: Jan of 2016. You will receive a reminder letter in the mail two months in advance. If you don't receive a letter, please call our office to schedule the follow-up appointment.

## 2014-04-13 ENCOUNTER — Ambulatory Visit (INDEPENDENT_AMBULATORY_CARE_PROVIDER_SITE_OTHER): Payer: Medicare Other | Admitting: Cardiology

## 2014-04-13 DIAGNOSIS — I2510 Atherosclerotic heart disease of native coronary artery without angina pectoris: Secondary | ICD-10-CM

## 2014-04-13 NOTE — Progress Notes (Signed)
Exercise Treadmill Test  Pre-Exercise Testing Evaluation Rhythm: normal sinus  Rate: 57 bpm     Test  Exercise Tolerance Test Ordering MD: Daneen Schick, MD  Interpreting MD: Kerin Ransom, PA-C  Unique Test No: 1  Treadmill:  1  Indication for ETT: known ASHD  Contraindication to ETT: No   Stress Modality: exercise - treadmill  Cardiac Imaging Performed: non   Protocol: standard Bruce - maximal  Max BP:  166/80  Max MPHR (bpm):  147 85% MPR (bpm):  125  MPHR obtained (bpm):  127 % MPHR obtained:  86%  Reached 85% MPHR (min:sec):  6:28 Total Exercise Time (min-sec):  7:00  Workload in METS:  7.1 Borg Scale: 13  Reason ETT Terminated:  Pt achieved 85% APMHR. He was complaining of hip pain.     ST Segment Analysis At Rest: normal ST segments - no evidence of significant ST depression With Exercise: no evidence of significant ST depression  Other Information Arrhythmia:  occasional PVC in recovery Angina during ETT:  absent (0) Quality of ETT:  diagnostic  ETT Interpretation:  normal - no evidence of ischemia by ST analysis  Comments: He achieved 85% APMHR. Test stopped secondary to hip pain  Recommendations: Per Tamala Julian

## 2014-04-19 ENCOUNTER — Telehealth: Payer: Self-pay | Admitting: Interventional Cardiology

## 2014-04-19 NOTE — Telephone Encounter (Signed)
Records rec From [facility], gave to Operator Room 10.1.15/km

## 2014-04-25 ENCOUNTER — Telehealth: Payer: Self-pay

## 2014-04-25 NOTE — Telephone Encounter (Signed)
pt wife aware of cardiac monitor results -OK pt wife adv if pt has add questions pt is to call back pt wife verbalized understanding.

## 2014-05-03 ENCOUNTER — Other Ambulatory Visit: Payer: Self-pay | Admitting: Geriatric Medicine

## 2014-05-03 DIAGNOSIS — F17200 Nicotine dependence, unspecified, uncomplicated: Secondary | ICD-10-CM

## 2014-05-08 ENCOUNTER — Ambulatory Visit
Admission: RE | Admit: 2014-05-08 | Discharge: 2014-05-08 | Disposition: A | Discharge status: Home / Self Care | Payer: Medicare Other | Source: Ambulatory Visit | Attending: Geriatric Medicine | Admitting: Geriatric Medicine

## 2014-05-08 DIAGNOSIS — F17200 Nicotine dependence, unspecified, uncomplicated: Secondary | ICD-10-CM

## 2014-05-21 ENCOUNTER — Ambulatory Visit: Payer: Medicare Other | Admitting: Internal Medicine

## 2014-08-22 ENCOUNTER — Ambulatory Visit: Payer: Medicare Other | Admitting: Interventional Cardiology

## 2014-08-28 ENCOUNTER — Encounter: Payer: Self-pay | Admitting: Interventional Cardiology

## 2014-08-28 ENCOUNTER — Ambulatory Visit (INDEPENDENT_AMBULATORY_CARE_PROVIDER_SITE_OTHER): Payer: Medicare Other | Admitting: Interventional Cardiology

## 2014-08-28 VITALS — BP 128/82 | HR 68 | Ht 69.5 in | Wt 164.8 lb

## 2014-08-28 DIAGNOSIS — I252 Old myocardial infarction: Secondary | ICD-10-CM

## 2014-08-28 DIAGNOSIS — E785 Hyperlipidemia, unspecified: Secondary | ICD-10-CM

## 2014-08-28 DIAGNOSIS — I2510 Atherosclerotic heart disease of native coronary artery without angina pectoris: Secondary | ICD-10-CM

## 2014-08-28 NOTE — Progress Notes (Signed)
Cardiology Office Note   Date:  08/28/2014   ID:  [name], DOB 09-14-1940, MRN [id number]  PCP:  Mathews Argyle, MD  Cardiologist:   Sinclair Grooms, MD   No chief complaint on file.     History of Present Illness: Brian [name] is a 74 y.o. male who presents for follow-up of syncope and coronary artery disease. He's had no recurrent dizziness or syncope. Stress test and prolonged monitoring did not demonstrate any explanation. He is now having lower back discomfort. Overall he feels well and has had no symptoms to suggest a cardiac issue.    Past Medical History  Diagnosis Date  . Sleep apnea   . Myocardial infarction 1998  . Coronary artery disease   . Hyperlipidemia   . Carpal tunnel syndrome on left   . Cervical spine disease   . Hypertension   . Adenomatous polyp   . Low back pain     Past Surgical History  Procedure Laterality Date  . Appendectomy    . Hernia repair    . Hemorrhoid banding    . Bunions    . Lumbar disc protrussion  03/2010  . Vasectomy    . Cataract extraction       Current Outpatient Prescriptions  Medication Sig Dispense Refill  . aspirin 81 MG tablet Take 81 mg by mouth daily.      Marland Kitchen atorvastatin (LIPITOR) 40 MG tablet Take 40 mg by mouth daily.      . Coenzyme Q10 (COQ10) 100 MG CAPS Take 1 capsule by mouth daily.      . Multiple Minerals-Vitamins (CITRACAL PLUS BONE DENSITY) TABS Take 1 tablet by mouth 2 (two) times daily.      . multivitamin (THERAGRAN) per tablet Take 1 tablet by mouth daily.      . nitroGLYCERIN (NITROSTAT) 0.4 MG SL tablet Place 0.4 mg under the tongue every 5 (five) minutes as needed for chest pain.    Marland Kitchen oxyCODONE-acetaminophen (PERCOCET) 10-325 MG per tablet 1 tablet every 6 (six) hours as needed.    . quinapril (ACCUPRIL) 10 MG tablet Take 10 mg by mouth at bedtime.      . zaleplon (SONATA) 10 MG capsule Take 10 mg by mouth at bedtime as needed.      No current facility-administered  medications for this visit.    Allergies:   Review of patient's allergies indicates no known allergies.    Social History:  The patient  reports that he has been smoking Cigarettes.  He has a 25 pack-year smoking history. He does not have any smokeless tobacco history on file. He reports that he drinks alcohol.   Family History:  The patient's family history includes Coronary artery disease in his brother; Heart failure in his mother; Hypertension in his father; Kidney disease in his father.    ROS:  Please see the history of present illness.   Otherwise, review of systems are positive for sciatica and left leg and bilateral lower extremity numbness .   All other systems are reviewed and negative.    PHYSICAL EXAM: VS:  BP 128/82 mmHg  Pulse 68  Ht 5' 9.5" (1.765 m)  Wt 164 lb 12.8 oz (74.753 kg)  BMI 24.00 kg/m2  SpO2 95% , BMI Body mass index is 24 kg/(m^2). GEN: Well nourished, well developed, in no acute distress HEENT: normal Neck: no JVD, carotid bruits, or masses Cardiac: RRR; no murmurs, rubs, or gallops,no edema  Respiratory:  clear  to auscultation bilaterally, normal work of breathing GI: soft, nontender, nondistended, + BS MS: no deformity or atrophy Skin: warm and dry, no rash Neuro:  Strength and sensation are intact Psych: euthymic mood, full affect   EKG:  EKG is not ordered today. The ekg ordered today demonstrates    Recent Labs: No results found for requested labs within last 365 days.    Lipid Panel No results found for: CHOL, TRIG, HDL, CHOLHDL, VLDL, LDLCALC, LDLDIRECT    Wt Readings from Last 3 Encounters:  08/28/14 164 lb 12.8 oz (74.753 kg)  03/19/14 162 lb (73.483 kg)  08/03/13 164 lb (74.39 kg)      Other studies Reviewed: Additional studies/ records that were reviewed today include: . Review of the above records demonstrates:    ASSESSMENT AND PLAN:  1.   coronary atherosclerosis with prior history of CAD and LAD angioplasty 1998.  No anginal or ischemic symptoms at this time. 2. Syncope has not recurred. Etiology was not found. 3. Hyperlipidemia, on therapy.   Current medicines are reviewed at length with the patient today.  The patient does not have concerns regarding medicines.  The following changes have been made:  no change  Labs/ tests ordered today include:  No orders of the defined types were placed in this encounter.     Disposition:   FU witH. Tamala Julian in [place], Sinclair Grooms, MD  08/28/2014 5:26 PM    [address], [place], [place]  [address] Phone: [phone]; Fax: [phone]

## 2014-08-28 NOTE — Patient Instructions (Signed)
Your physician recommends that you continue on your current medications as directed. Please refer to the Current Medication list given to you today.  Your physician wants you to follow-up in: 1 year with Dr.Smith You will receive a reminder letter in the mail two months in advance. If you don't receive a letter, please call our office to schedule the follow-up appointment.  

## 2014-11-10 NOTE — Op Note (Signed)
PATIENT NAME:  Brian [name], Brian [name] MR#:  [id number] DATE OF BIRTH:  [birth year]  DATE OF PROCEDURE:  10/23/2013  PREOPERATIVE DIAGNOSIS: Cataract, right eye.   POSTOPERATIVE DIAGNOSIS: Cataract, right eye.  PROCEDURE PERFORMED: Extracapsular cataract extraction using phacoemulsification with placement of Alcon [id number], 20.5-diopter posterior chamber lens, serial number [id number].   SURGEON: Loura Back. Wilhelmena Zea, M.D.   ANESTHESIA: 4% lidocaine and 0.75% Marcaine a 50-50 mixture with 10 units/mL of HyoMax added, given as a peribulbar.   ANESTHESIOLOGIST: Dr. Benjamine Mola.   COMPLICATIONS: None.   ESTIMATED BLOOD LOSS: Less than 1 mL.   DESCRIPTION OF PROCEDURE: The patient was brought to the operating room and given a peribulbar block.  The patient was then prepped and draped in the usual fashion.  The vertical rectus muscles were imbricated using 5-0 silk sutures.  These sutures were then clamped to the sterile drapes as bridle sutures.  A limbal peritomy was performed extending two clock hours and hemostasis was obtained with cautery.  A partial thickness scleral groove was made at the surgical limbus and dissected anteriorly in a lamellar dissection using an Alcon crescent knife.  The anterior chamber was entered superonasally with a Superblade and through the lamellar dissection with a 2.6 mm keratome.  DisCoVisc was used to replace the aqueous and a continuous tear capsulorrhexis was carried out.  Hydrodissection and hydrodelineation were carried out with balanced salt and a 27 gauge canula.  The nucleus was rotated to confirm the effectiveness of the hydrodissection.  Phacoemulsification was carried out using a divide-and-conquer technique.  Total ultrasound time was 1 minute and 43 seconds with an average power of 24%. CDE 42.51.  Irrigation/aspiration was used to remove the residual cortex.  DisCoVisc was used to inflate the capsule and the internal incision was enlarged to 3 mm with the  crescent knife.  The intraocular lens was folded and inserted into the capsular bag using the AcrySert delivery system.  Irrigation/aspiration was used to remove the residual DisCoVisc.  Miostat was injected into the anterior chamber through the paracentesis track to inflate the anterior chamber and induce miosis.  A tenth of a mL of cefuroxime was injected via the paracentesis track. The wound was checked for leaks and none were found. The conjunctiva was closed with cautery and the bridle sutures were removed.  Two drops of 0.3% Vigamox were placed on the eye.   An eye shield was placed on the eye.  The patient was discharged to the recovery room in good condition.  ____________________________ Loura Back Latham Kinzler, MD sad:aw D: 10/23/2013 12:11:54 ET T: 10/23/2013 12:32:29 ET JOB#: [id number]  cc: Remo Lipps A. Kaoru Benda, MD, <Dictator> Martie Lee MD ELECTRONICALLY SIGNED 10/30/2013 12:23

## 2014-11-10 NOTE — Op Note (Signed)
PATIENT NAME:  Brian, [name] MR#:  [id number] DATE OF BIRTH:  10-04-1940  DATE OF PROCEDURE:  07/24/2013  PREOPERATIVE DIAGNOSIS: Cataract, left eye.   POSTOPERATIVE DIAGNOSIS: Cataract, left eye.   PROCEDURE PERFORMED: Extracapsular cataract extraction using phacoemulsification with placement of Alcon SN6CWS 20.0-diopter posterior chamber lens, serial number [id number].   SURGEON: Loura Back. Claudett Bayly, M.D.   ASSISTANT:  None.   ANESTHESIA: 4% lidocaine, 0.75% Marcaine, a 50-50 mixture with 10 units/mL of HyoMax added given as a peribulbar.   ANESTHESIOLOGIST: Dr. [place] Service.   COMPLICATIONS: None.   ESTIMATED BLOOD LOSS: Less than 1 mL.   DESCRIPTION OF PROCEDURE:  The patient was brought to the operating room and given a peribulbar block.  The patient was then prepped and draped in the usual fashion.  The vertical rectus muscles were imbricated using 5-0 silk sutures.  These sutures were then clamped to the sterile drapes as bridle sutures.  A limbal peritomy was performed extending two clock hours and hemostasis was obtained with cautery.  A partial thickness scleral groove was made at the surgical limbus and dissected anteriorly in a lamellar dissection using an Alcon crescent knife.  The anterior chamber was entered supero-temporally with a Superblade and through the lamellar dissection with a 2.6 mm keratome.  DisCoVisc was used to replace the aqueous and a continuous tear capsulorrhexis was carried out.  Hydrodissection and hydrodelineation were carried out with balanced salt and a 27 gauge canula.  The nucleus was rotated to confirm the effectiveness of the hydrodissection.  Phacoemulsification was carried out using a divide-and-conquer technique.  Total ultrasound time was 1 minute 24 seconds with an average power of 22.6%.  CDE 33.48.  Irrigation/aspiration was used to remove the residual cortex.  DisCoVisc was used to inflate the capsule and the internal incision was  enlarged to 3 mm with the crescent knife.  The intraocular lens was folded and inserted into the capsular bag using the Ocusert delivery system.  Irrigation/aspiration was used to remove the residual DisCoVisc.  Miostat was injected into the anterior chamber through the paracentesis track to inflate the anterior chamber and induce miosis.  0.1 mL of cefuroxime containing 1 mg of drug was injected via the paracentesis tract. The wound was checked for leaks and none were found. The conjunctiva was closed with cautery and the bridle sutures were removed.  Two drops of 0.3% Vigamox were placed on the eye.   An eye shield was placed on the eye.  The patient was discharged to the recovery room in good condition.    ____________________________ Loura Back Lashaun Poch, MD sad:dmm D: 07/24/2013 11:53:00 ET T: 07/24/2013 12:08:58 ET JOB#: [id number]  cc: Remo Lipps A. Orvin Netter, MD, <Dictator> Martie Lee MD ELECTRONICALLY SIGNED 07/31/2013 13:24

## 2015-01-02 ENCOUNTER — Ambulatory Visit
Admission: RE | Admit: 2015-01-02 | Discharge: 2015-01-02 | Disposition: A | Discharge status: Home / Self Care | Payer: Medicare Other | Source: Ambulatory Visit | Attending: Internal Medicine | Admitting: Internal Medicine

## 2015-01-02 ENCOUNTER — Other Ambulatory Visit: Payer: Self-pay | Admitting: Internal Medicine

## 2015-01-02 DIAGNOSIS — M25471 Effusion, right ankle: Secondary | ICD-10-CM

## 2015-01-14 ENCOUNTER — Other Ambulatory Visit: Payer: Self-pay

## 2015-02-17 ENCOUNTER — Emergency Department: Payer: Medicare Other

## 2015-02-17 ENCOUNTER — Inpatient Hospital Stay
Admission: EM | Admit: 2015-02-17 | Discharge: 2015-02-21 | DRG: 372 | Disposition: A | Discharge status: Home / Self Care | Payer: Medicare Other | Attending: Internal Medicine | Admitting: Internal Medicine

## 2015-02-17 ENCOUNTER — Encounter: Payer: Self-pay | Admitting: Emergency Medicine

## 2015-02-17 DIAGNOSIS — K625 Hemorrhage of anus and rectum: Secondary | ICD-10-CM | POA: Diagnosis not present

## 2015-02-17 DIAGNOSIS — K529 Noninfective gastroenteritis and colitis, unspecified: Secondary | ICD-10-CM | POA: Diagnosis present

## 2015-02-17 DIAGNOSIS — G4730 Sleep apnea, unspecified: Secondary | ICD-10-CM | POA: Diagnosis present

## 2015-02-17 DIAGNOSIS — Z7982 Long term (current) use of aspirin: Secondary | ICD-10-CM

## 2015-02-17 DIAGNOSIS — I10 Essential (primary) hypertension: Secondary | ICD-10-CM | POA: Diagnosis present

## 2015-02-17 DIAGNOSIS — Z8249 Family history of ischemic heart disease and other diseases of the circulatory system: Secondary | ICD-10-CM

## 2015-02-17 DIAGNOSIS — Z79891 Long term (current) use of opiate analgesic: Secondary | ICD-10-CM

## 2015-02-17 DIAGNOSIS — E785 Hyperlipidemia, unspecified: Secondary | ICD-10-CM | POA: Diagnosis present

## 2015-02-17 DIAGNOSIS — Z841 Family history of disorders of kidney and ureter: Secondary | ICD-10-CM

## 2015-02-17 DIAGNOSIS — Z79899 Other long term (current) drug therapy: Secondary | ICD-10-CM

## 2015-02-17 DIAGNOSIS — I2510 Atherosclerotic heart disease of native coronary artery without angina pectoris: Secondary | ICD-10-CM | POA: Diagnosis present

## 2015-02-17 DIAGNOSIS — Z9889 Other specified postprocedural states: Secondary | ICD-10-CM

## 2015-02-17 DIAGNOSIS — D72829 Elevated white blood cell count, unspecified: Secondary | ICD-10-CM

## 2015-02-17 DIAGNOSIS — I252 Old myocardial infarction: Secondary | ICD-10-CM | POA: Diagnosis not present

## 2015-02-17 DIAGNOSIS — Z9849 Cataract extraction status, unspecified eye: Secondary | ICD-10-CM | POA: Diagnosis not present

## 2015-02-17 DIAGNOSIS — K559 Vascular disorder of intestine, unspecified: Secondary | ICD-10-CM

## 2015-02-17 DIAGNOSIS — Z716 Tobacco abuse counseling: Secondary | ICD-10-CM | POA: Diagnosis not present

## 2015-02-17 DIAGNOSIS — Z9049 Acquired absence of other specified parts of digestive tract: Secondary | ICD-10-CM | POA: Diagnosis present

## 2015-02-17 DIAGNOSIS — A047 Enterocolitis due to Clostridium difficile: Secondary | ICD-10-CM | POA: Diagnosis present

## 2015-02-17 DIAGNOSIS — A0472 Enterocolitis due to Clostridium difficile, not specified as recurrent: Secondary | ICD-10-CM | POA: Diagnosis present

## 2015-02-17 LAB — CBC
HEMATOCRIT: 48.8 % (ref 40.0–52.0)
HEMOGLOBIN: 16 g/dL (ref 13.0–18.0)
MCH: 29 pg (ref 26.0–34.0)
MCHC: 32.8 g/dL (ref 32.0–36.0)
MCV: 88.4 fL (ref 80.0–100.0)
Platelets: 233 10*3/uL (ref 150–440)
RBC: 5.52 MIL/uL (ref 4.40–5.90)
RDW: 13.9 % (ref 11.5–14.5)
WBC: 22.1 10*3/uL — AB (ref 3.8–10.6)

## 2015-02-17 LAB — COMPREHENSIVE METABOLIC PANEL
ALK PHOS: 103 U/L (ref 38–126)
ALT: 22 U/L (ref 17–63)
ANION GAP: 10 (ref 5–15)
AST: 30 U/L (ref 15–41)
Albumin: 4.3 g/dL (ref 3.5–5.0)
BUN: 15 mg/dL (ref 6–20)
CHLORIDE: 104 mmol/L (ref 101–111)
CO2: 27 mmol/L (ref 22–32)
Calcium: 9.8 mg/dL (ref 8.9–10.3)
Creatinine, Ser: 0.93 mg/dL (ref 0.61–1.24)
GFR calc non Af Amer: >60 mL/min (ref >60)
GLUCOSE: 131 mg/dL — AB (ref 65–99)
POTASSIUM: 4.8 mmol/L (ref 3.5–5.1)
Sodium: 141 mmol/L (ref 135–145)
Total Bilirubin: 0.8 mg/dL (ref 0.3–1.2)
Total Protein: 7.8 g/dL (ref 6.5–8.1)

## 2015-02-17 MED ORDER — HYDROMORPHONE HCL 1 MG/ML IJ SOLN
0.5000 mg | INTRAMUSCULAR | Status: AC | PRN
  Administered 2015-02-17 (×2): 0.5 mg via INTRAVENOUS
  Filled 2015-02-17 (×3): qty 1

## 2015-02-17 MED ORDER — IOHEXOL 300 MG/ML  SOLN
100.0000 mL | Freq: Once | INTRAMUSCULAR | Status: AC | PRN
  Administered 2015-02-17: 100 mL via INTRAVENOUS

## 2015-02-17 MED ORDER — IOHEXOL 240 MG/ML SOLN
25.0000 mL | Freq: Once | INTRAMUSCULAR | Status: AC | PRN
  Administered 2015-02-17: 25 mL via ORAL

## 2015-02-17 MED ORDER — SODIUM CHLORIDE 0.9 % IV BOLUS (SEPSIS)
1000.0000 mL | Freq: Once | INTRAVENOUS | Status: AC
  Administered 2015-02-17: 1000 mL via INTRAVENOUS

## 2015-02-17 MED ORDER — ONDANSETRON HCL 4 MG/2ML IJ SOLN
4.0000 mg | Freq: Once | INTRAMUSCULAR | Status: AC
  Administered 2015-02-17: 4 mg via INTRAVENOUS
  Filled 2015-02-17: qty 2

## 2015-02-17 MED ORDER — ONDANSETRON HCL 4 MG PO TABS: 4.0000 mg | ORAL_TABLET | Freq: Four times a day (QID) | ORAL | Status: DC | PRN

## 2015-02-17 MED ORDER — ACETAMINOPHEN 650 MG RE SUPP: 650.0000 mg | Freq: Four times a day (QID) | RECTAL | Status: DC | PRN

## 2015-02-17 MED ORDER — HYDROMORPHONE HCL 1 MG/ML IJ SOLN
0.5000 mg | INTRAMUSCULAR | Status: AC | PRN
  Administered 2015-02-17 (×2): 0.5 mg via INTRAVENOUS

## 2015-02-17 MED ORDER — ACETAMINOPHEN 325 MG PO TABS: 650.0000 mg | ORAL_TABLET | Freq: Four times a day (QID) | ORAL | Status: DC | PRN

## 2015-02-17 MED ORDER — MORPHINE SULFATE 2 MG/ML IJ SOLN
2.0000 mg | INTRAMUSCULAR | Status: DC | PRN
  Administered 2015-02-18 – 2015-02-20 (×6): 2 mg via INTRAVENOUS
  Filled 2015-02-17 (×6): qty 1

## 2015-02-17 MED ORDER — METRONIDAZOLE IN NACL 5-0.79 MG/ML-% IV SOLN: 500.0000 mg | Freq: Once | INTRAVENOUS | Status: DC

## 2015-02-17 MED ORDER — CIPROFLOXACIN IN D5W 400 MG/200ML IV SOLN
400.0000 mg | Freq: Once | INTRAVENOUS | Status: AC
  Administered 2015-02-17: 400 mg via INTRAVENOUS
  Filled 2015-02-17: qty 200

## 2015-02-17 MED ORDER — ONDANSETRON HCL 4 MG/2ML IJ SOLN: 4.0000 mg | Freq: Four times a day (QID) | INTRAMUSCULAR | Status: DC | PRN

## 2015-02-17 NOTE — ED Notes (Signed)
Patient transported to room from CT 

## 2015-02-17 NOTE — ED Provider Notes (Signed)
[facility] Emergency Department Provider Note  ____________________________________________  Time seen: 1750   I have reviewed the triage vital signs and the nursing notes.   HISTORY  Chief Complaint Rectal Bleeding and Abdominal Pain     HPI Brian [name] is a 74 y.o. male who began to have rectal bleeding today. None yesterday. He also began to have lower abdominal cramping. He has not had prior symptoms like this. He denies any fever. He reports he initially had some diarrhea with blood but then was eliminating just blood. This is bright red. No rectal pain.   Past Medical History  Diagnosis Date  . Sleep apnea   . Myocardial infarction 1998  . Coronary artery disease   . Hyperlipidemia   . Carpal tunnel syndrome on left   . Cervical spine disease   . Hypertension   . Adenomatous polyp   . Low back pain     Patient Active Problem List   Diagnosis Date Noted  . COUGH 04/08/2010  . Hyperlipidemia 02/05/2010  . TOBACCO USER 02/05/2010  . Old myocardial infarction 02/05/2010  . Coronary atherosclerosis 02/05/2010  . HYPERSOMNIA WITH SLEEP APNEA UNSPECIFIED 02/05/2010    Past Surgical History  Procedure Laterality Date  . Appendectomy    . Hernia repair    . Hemorrhoid banding    . Bunions    . Lumbar disc protrussion  03/2010  . Vasectomy    . Cataract extraction      Current Outpatient Rx  Name  Route  Sig  Dispense  Refill  . aspirin 81 MG tablet   Oral   Take 81 mg by mouth daily.           Marland Kitchen atorvastatin (LIPITOR) 40 MG tablet   Oral   Take 40 mg by mouth daily.           . Coenzyme Q10 (COQ10) 100 MG CAPS   Oral   Take 1 capsule by mouth daily.           . Multiple Minerals-Vitamins (CITRACAL PLUS BONE DENSITY) TABS   Oral   Take 1 tablet by mouth 2 (two) times daily.           . multivitamin (THERAGRAN) per tablet   Oral   Take 1 tablet by mouth daily.           . nitroGLYCERIN (NITROSTAT) 0.4 MG SL  tablet   Sublingual   Place 0.4 mg under the tongue every 5 (five) minutes as needed for chest pain.         Marland Kitchen oxyCODONE-acetaminophen (PERCOCET) 10-325 MG per tablet      1 tablet every 6 (six) hours as needed.         . quinapril (ACCUPRIL) 10 MG tablet   Oral   Take 10 mg by mouth at bedtime.           Marland Kitchen EXPIRED: zaleplon (SONATA) 10 MG capsule   Oral   Take 10 mg by mouth at bedtime as needed.            Allergies Review of patient's allergies indicates no known allergies.  Family History  Problem Relation Age of Onset  . Kidney disease Father   . Hypertension Father   . Heart failure Mother   . Coronary artery disease Brother     Social History History  Substance Use Topics  . Smoking status: Current Every Day Smoker -- 1.00 packs/day for 25 years  Types: Cigarettes  . Smokeless tobacco: Not on file     Comment: 1/2 3/4 ppd  . Alcohol Use: Yes     Comment: occasionally    Review of Systems  Constitutional: Negative for fever. ENT: Negative for sore throat. Cardiovascular: Negative for chest pain. Respiratory: Negative for shortness of breath. Gastrointestinal: Positive for blood per rectum and abdominal cramping. See history of present illness  Genitourinary: Negative for dysuria. Musculoskeletal: No myalgias or injuries. Skin: Negative for rash. Neurological: Negative for headaches   10-point ROS otherwise negative.  ____________________________________________   PHYSICAL EXAM:  VITAL SIGNS: ED Triage Vitals  Enc Vitals Group     BP 02/17/15 1623 146/9 mmHg     Pulse Rate 02/17/15 1623 82     Resp 02/17/15 1623 24     Temp 02/17/15 1623 98.4 F (36.9 C)     Temp Source 02/17/15 1623 Oral     SpO2 02/17/15 1623 96 %     Weight --      Height --      Head Cir --      Peak Flow --      Pain Score 02/17/15 1622 10     Pain Loc --      Pain Edu? --      Excl. in [place]? --     Constitutional:  Alert and oriented. Appears mildly to  moderately uncomfortable.Marland Kitchen ENT   Head: Normocephalic and atraumatic.   Nose: No congestion/rhinnorhea.   Mouth/Throat: Mucous membranes are moist. Cardiovascular: Normal rate, regular rhythm, no murmur noted Respiratory:  Normal respiratory effort, no tachypnea.    Breath sounds are clear and equal bilaterally.  Gastrointestinal: Soft with tenderness in the lower abdomen. Positive bowel sounds, slightly increased from what would be expected. No distention.  Rectal: Bright red blood present. Back: No muscle spasm, no tenderness, no CVA tenderness. Musculoskeletal: No deformity noted. Nontender with normal range of motion in all extremities.  No noted edema. Neurologic:  Normal speech and language. No gross focal neurologic deficits are appreciated.  Skin:  Skin is warm, dry. No rash noted. Psychiatric: Mood and affect are normal. Speech and behavior are normal.  ____________________________________________    LABS (pertinent positives/negatives)  Labs Reviewed  COMPREHENSIVE METABOLIC PANEL - Abnormal; Notable for the following:    Glucose, Bld 131 (*)    All other components within normal limits  CBC - Abnormal; Notable for the following:    WBC 22.1 (*)    All other components within normal limits     ____________________________________________   RADIOLOGY  CT abdomen and pelvis:  IMPRESSION: Moderate acute colitis from the splenic flexure to the sigmoid colon. No evidence of perforation. Mesenteric arteries are patent. Etiology likely infectious or inflammatory in nature.   ____________________________________________ ____________________________________________   INITIAL IMPRESSION / ASSESSMENT AND PLAN / ED COURSE  Pertinent labs & imaging results that were available during my care of the patient were reviewed by me and considered in my medical decision making (see chart for details).  Patient with bleeding per rectum. This is bright red blood. He has  no melena by report or on exam. He has an elevated white blood cell count 22,000. This is worrisome for some form of intra-abdominal infection, possibly diverticulitis. We'll obtain a CT scan of his abdomen pelvis. The patient is receiving Dilaudid IV for pain control. Of note, he has excellent Hemoglobin level of 16.  ----------------------------------------- 10:02 PM on 02/17/2015 -----------------------------------------  CT scan shows diffuse colitis  of the large intestines from the splenic flexure to the sigmoid colon. We will start him on Cipro and metronidazole and seek admission with the hospitalist for rectal bleeding and colitis with leukocytosis.   ____________________________________________   FINAL CLINICAL IMPRESSION(S) / ED DIAGNOSES  Final diagnoses:  Rectal bleeding  Colitis  Leukocytosis      Ahmed Prima, MD 02/17/15 2203

## 2015-02-17 NOTE — ED Notes (Signed)
Pt presents to the ER from home with complaints of rectal bleeding since 03:00am, pt reports weakness, and diaphoretic all day, pt day having history of rectal bleeding. Pt talks in complete sentences no respiratory distress noted.

## 2015-02-17 NOTE — ED Notes (Signed)
Pt drinking contrast. 

## 2015-02-18 ENCOUNTER — Encounter: Payer: Self-pay | Admitting: Internal Medicine

## 2015-02-18 DIAGNOSIS — K529 Noninfective gastroenteritis and colitis, unspecified: Secondary | ICD-10-CM | POA: Diagnosis present

## 2015-02-18 LAB — CBC
HCT: 40.1 % (ref 40.0–52.0)
HCT: 40.8 % (ref 40.0–52.0)
HCT: 43.2 % (ref 40.0–52.0)
HEMATOCRIT: 40.7 % (ref 40.0–52.0)
HEMOGLOBIN: 13.1 g/dL (ref 13.0–18.0)
HEMOGLOBIN: 13.3 g/dL (ref 13.0–18.0)
HEMOGLOBIN: 13.4 g/dL (ref 13.0–18.0)
Hemoglobin: 14.5 g/dL (ref 13.0–18.0)
MCH: 28.8 pg (ref 26.0–34.0)
MCH: 28.7 pg (ref 26.0–34.0)
MCH: 29 pg (ref 26.0–34.0)
MCH: 29.4 pg (ref 26.0–34.0)
MCHC: 32.7 g/dL (ref 32.0–36.0)
MCHC: 32.6 g/dL (ref 32.0–36.0)
MCHC: 33 g/dL (ref 32.0–36.0)
MCHC: 33.5 g/dL (ref 32.0–36.0)
MCV: 87.8 fL (ref 80.0–100.0)
MCV: 88.1 fL (ref 80.0–100.0)
MCV: 87.7 fL (ref 80.0–100.0)
MCV: 87.6 fL (ref 80.0–100.0)
PLATELETS: 181 10*3/uL (ref 150–440)
PLATELETS: 183 10*3/uL (ref 150–440)
Platelets: 188 10*3/uL (ref 150–440)
Platelets: 180 10*3/uL (ref 150–440)
RBC: 4.56 MIL/uL (ref 4.40–5.90)
RBC: 4.64 MIL/uL (ref 4.40–5.90)
RBC: 4.64 MIL/uL (ref 4.40–5.90)
RBC: 4.93 MIL/uL (ref 4.40–5.90)
RDW: 13.4 % (ref 11.5–14.5)
RDW: 13.6 % (ref 11.5–14.5)
RDW: 13.6 % (ref 11.5–14.5)
RDW: 13.9 % (ref 11.5–14.5)
WBC: 18.8 10*3/uL — ABNORMAL HIGH (ref 3.8–10.6)
WBC: 20.2 10*3/uL — AB (ref 3.8–10.6)
WBC: 21.2 10*3/uL — ABNORMAL HIGH (ref 3.8–10.6)
WBC: 24.4 10*3/uL — ABNORMAL HIGH (ref 3.8–10.6)

## 2015-02-18 LAB — C DIFFICILE QUICK SCREEN W PCR REFLEX
C DIFFICILE (CDIFF) INTERP: POSITIVE
C DIFFICILE (CDIFF) TOXIN: NEGATIVE
C Diff antigen: POSITIVE — AB

## 2015-02-18 MED ORDER — LISINOPRIL 10 MG PO TABS
10.0000 mg | ORAL_TABLET | Freq: Every day | ORAL | Status: DC
  Administered 2015-02-18 – 2015-02-19 (×3): 10 mg via ORAL
  Filled 2015-02-18 (×2): qty 1

## 2015-02-18 MED ORDER — HYDROCODONE-ACETAMINOPHEN 5-325 MG PO TABS
1.0000 | ORAL_TABLET | ORAL | Status: DC | PRN
Start: 2015-02-18 — End: 2015-02-21
  Administered 2015-02-20 – 2015-02-21 (×3): 1 via ORAL
  Filled 2015-02-18 (×3): qty 1

## 2015-02-18 MED ORDER — PEG 3350-KCL-NABCB-NACL-NASULF 236 G PO SOLR
4000.0000 mL | Freq: Once | ORAL | Status: AC
  Administered 2015-02-18: 4000 mL via ORAL
  Filled 2015-02-18: qty 4000

## 2015-02-18 MED ORDER — METRONIDAZOLE IN NACL 5-0.79 MG/ML-% IV SOLN
500.0000 mg | Freq: Three times a day (TID) | INTRAVENOUS | Status: DC
  Administered 2015-02-18 – 2015-02-19 (×4): 500 mg via INTRAVENOUS
  Filled 2015-02-18 (×8): qty 100

## 2015-02-18 MED ORDER — ATORVASTATIN CALCIUM 20 MG PO TABS
40.0000 mg | ORAL_TABLET | Freq: Every day | ORAL | Status: DC
  Administered 2015-02-18 – 2015-02-21 (×3): 40 mg via ORAL
  Filled 2015-02-18 (×5): qty 2

## 2015-02-18 MED ORDER — CIPROFLOXACIN IN D5W 400 MG/200ML IV SOLN
400.0000 mg | Freq: Two times a day (BID) | INTRAVENOUS | Status: DC
  Administered 2015-02-18 – 2015-02-19 (×3): 400 mg via INTRAVENOUS
  Filled 2015-02-18 (×5): qty 200

## 2015-02-18 NOTE — Care Management Note (Signed)
Case Management Note  Patient Details  Name: MONTAE STAGER MRN: [id number] Date of Birth: 1941-03-13  Subjective/Objective:                 Patient admitted for GI bleed.  Patient lives at home with wife. He obtains his medications at [facility] in [place]. Patient does not have an o2 requirement, and has no home equipment.  No CM needs anticipated.    Action/Plan:   Expected Discharge Date:                  Expected Discharge Plan:     In-House Referral:     Discharge planning Services     Post Acute Care Choice:    Choice offered to:     DME Arranged:    DME Agency:     HH Arranged:    [place] Agency:     Status of Service:     Medicare Important Message Given:    Date Medicare IM Given:    Medicare IM give by:    Date Additional Medicare IM Given:    Additional Medicare Important Message give by:     If discussed at [place] of Stay Meetings, dates discussed:    Additional Comments:  Beverly Sessions, RN 02/18/2015, 10:14 AM

## 2015-02-18 NOTE — Consult Note (Signed)
[facility]  [address] [place], [place] [address] Phone: [phone] Fax : [phone]  Consultation  Referring Provider:     No ref. provider found Primary Care Physician:  Mathews Argyle, MD Primary Gastroenterologist:  Dr. in [facility]         Reason for Consultation:     Rectal bleeding  Date of Admission:  02/17/2015 Date of Consultation:  02/18/2015         HPI:   Brian [name] is a 74 y.o. male comes in with rectal bleeding. The patient states he had rectal bleeding at home and had multiple episodes of this. The patient was concerned and came to the emergency department. The patient had rectal bleeding again yesterday with lower abdominal pain in the suprapubic area and states that he then had recurrent bleeding at 7:00 this morning. He states that he passes bright red blood with clots. There is no report of any previous episodes of rectal bleeding. The patient also had a colonoscopy within the last 2 years at gastrologist in [facility]. The patient denies being told he had any hemorrhoids or diverticulosis that time. He states he has not had any further bowel movements and has not been able to give anybody a sample of stool for C. difficile. He reports that he is on chronic pain medications but does not get constipated from it. He does report that he has been having some episodes of feeling hot. The patient's labs showed him to have a increased white cell count with normal hemoglobin.  Past Medical History  Diagnosis Date  . Sleep apnea   . Myocardial infarction 1998  . Coronary artery disease   . Hyperlipidemia   . Carpal tunnel syndrome on left   . Cervical spine disease   . Hypertension   . Adenomatous polyp   . Low back pain     Past Surgical History  Procedure Laterality Date  . Appendectomy    . Hernia repair    . Hemorrhoid banding    . Bunions    . Lumbar disc protrussion  03/2010  . Vasectomy    . Cataract  extraction      Prior to Admission medications   Medication Sig Start Date End Date Taking? Authorizing Provider  aspirin 81 MG tablet Take 81 mg by mouth daily.     Yes Historical Provider, MD  atorvastatin (LIPITOR) 40 MG tablet Take 40 mg by mouth daily.     Yes Historical Provider, MD  Coenzyme Q10 (COQ10) 100 MG CAPS Take 1 capsule by mouth daily.     Yes Historical Provider, MD  multivitamin [facility]) per tablet Take 1 tablet by mouth daily.     Yes Historical Provider, MD  oxyCODONE-acetaminophen (PERCOCET) 10-325 MG per tablet 1 tablet every 6 (six) hours as needed. 05/08/13  Yes Historical Provider, MD  quinapril (ACCUPRIL) 10 MG tablet Take 10 mg by mouth at bedtime.     Yes Historical Provider, MD    Family History  Problem Relation Age of Onset  . Kidney disease Father   . Hypertension Father   . Heart failure Mother   . Coronary artery disease Brother      History  Substance Use Topics  . Smoking status: Current Every Day Smoker -- 1.00 packs/day for 25 years    Types: Cigarettes  . Smokeless tobacco: Not on file     Comment: 1/2 3/4 ppd  . Alcohol Use: Yes  Comment: occasionally    Allergies as of 02/17/2015  . (No Known Allergies)    Review of Systems:    All systems reviewed and negative except where noted in HPI.   Physical Exam:  Vital signs in last 24 hours: Temp:  [98.4 F (36.9 C)-99.2 F (37.3 C)] 98.8 F (37.1 C) (08/01 1608) Pulse Rate:  [63-83] 75 (08/01 1608) Resp:  [14-21] 18 (08/01 1608) BP: (104-146)/(64-89) 104/68 mmHg (08/01 1608) SpO2:  [93 %-97 %] 95 % (08/01 1608) Weight:  [155 lb 3.2 oz (70.398 kg)] 155 lb 3.2 oz (70.398 kg) (08/01 0142) Last BM Date: 02/18/15 General:   Pleasant, cooperative in NAD Head:  Normocephalic and atraumatic. Eyes:   No icterus.   Conjunctiva pink. PERRLA. Ears:  Normal auditory acuity. Neck:  Supple; no masses or thyroidomegaly Lungs: Respirations even and unlabored. Lungs clear to auscultation  bilaterally.   No wheezes, crackles, or rhonchi.  Heart:  Regular rate and rhythm;  Without murmur, clicks, rubs or gallops Abdomen:  Soft, nondistended, nontender. Normal bowel sounds. No appreciable masses or hepatomegaly.  No rebound or guarding.  Rectal:  Not performed. Msk:  Symmetrical without gross deformities.  Strength  Extremities:  Without edema, cyanosis or clubbing. Neurologic:  Alert and oriented x3;  grossly normal neurologically. Skin:  Intact without significant lesions or rashes. Cervical Nodes:  No significant cervical adenopathy. Psych:  Alert and cooperative. Normal affect.  LAB RESULTS:  Recent Labs  02/18/15 0243 02/18/15 0840 02/18/15 1338  WBC 18.8* 20.2* 21.2*  HGB 13.1 13.3 13.4  HCT 40.1 40.8 40.7  PLT 181 188 180   BMET  Recent Labs  02/17/15 1631  NA 141  K 4.8  CL 104  CO2 27  GLUCOSE 131*  BUN 15  CREATININE 0.93  CALCIUM 9.8   LFT  Recent Labs  02/17/15 1631  PROT 7.8  ALBUMIN 4.3  AST 30  ALT 22  ALKPHOS 103  BILITOT 0.8   PT/INR No results for input(s): LABPROT, INR in the last 72 hours.  STUDIES: Ct Abdomen Pelvis W Contrast  02/17/2015   CLINICAL DATA:  Abdominal cramping and bright red blood per rectum since this morning. Previous appendectomy and multiple hernia repair.  EXAM: CT ABDOMEN AND PELVIS WITH CONTRAST  TECHNIQUE: Multidetector CT imaging of the abdomen and pelvis was performed using the standard protocol following bolus administration of intravenous contrast.  CONTRAST:  110mL OMNIPAQUE IOHEXOL 300 MG/ML  SOLN  COMPARISON:  11/22/2006  FINDINGS: Lung bases demonstrate mild dependent basilar atelectasis.  Abdominal images demonstrate a normal liver, spleen, pancreas, gallbladder and adrenal glands. Kidneys are normal in size without hydronephrosis or nephrolithiasis. Ureters are within normal.  Moderate wall thickening/ submucosal edema involving the colon from the splenic flexure distally to the sigmoid colon  compatible with acute colitis. There is mild adjacent inflammation in the pericolonic fat as well as mild free fluid along the left pericolic gutter. No evidence of perforation. No evidence of obstruction. There is minimal diverticulosis of the colon. Small bowel is within normal.  Moderate calcified plaque over the abdominal aorta. Mesenteric arteries are patent.  Pelvic images demonstrate the bladder and prostate to be within normal. Rectum is fluid-filled. Minimal free fluid within the pelvis.  There are degenerative changes of the spine with moderate disc disease at the L3-4 level. Mild gym change of the hips.  IMPRESSION: Moderate acute colitis from the splenic flexure to the sigmoid colon. No evidence of perforation. Mesenteric arteries are patent.  Etiology likely infectious or inflammatory in nature.   Electronically Signed   By: Marin Olp M.D.   On: 02/17/2015 20:18      Impression / Plan:   GONSALO CUTHBERTSON is a 75 y.o. y/o male with with rectal bleeding prior to admission and a few times during admission. The patient is in isolation now for C. difficile although he has had no further diarrhea or rectal bleeding since this morning. The patient may have symptoms consistent with ischemic colitis. The CT scan showed him to have inflammation in the splenic flexure down to the sigmoid colon. The patient will be set up for a colonoscopy for tomorrow and he will be prepped today. The patient has been explained the plan and agrees with it. I have discussed risks & benefits which include, but are not limited to, bleeding, infection, perforation & drug reaction.  The patient agrees with this plan & written consent will be obtained.      Thank you for involving me in the care of this patient.      LOS: 1 day   Ollen Bowl, MD  02/18/2015, 4:28 PM

## 2015-02-18 NOTE — Progress Notes (Signed)
[facility] [place] NAME: Brian [name]    MR#:  [id number]  DATE OF BIRTH:  Aug 06, 1940  SUBJECTIVE:  CHIEF COMPLAINT:   Chief Complaint  Patient presents with  . Rectal Bleeding  . Abdominal Pain   Continued abdominal cramping, continued blood with bowel movements.  REVIEW OF SYSTEMS:   Review of Systems  Constitutional: Negative for fever.  Respiratory: Negative for shortness of breath.   Cardiovascular: Negative for chest pain and palpitations.  Gastrointestinal: Positive for abdominal pain, diarrhea and blood in stool. Negative for nausea and vomiting.  Genitourinary: Negative for dysuria.    DRUG ALLERGIES:  No Known Allergies  VITALS:  Blood pressure 117/64, pulse 64, temperature 99.2 F (37.3 C), temperature source Oral, resp. rate 17, height 5\' 10"  (1.778 m), weight 70.398 kg (155 lb 3.2 oz), SpO2 94 %.  PHYSICAL EXAMINATION:  GENERAL:  74 y.o.-year-old patient lying in the bed with no acute distress.  EYES: Pupils equal, round, reactive to light and accommodation. No scleral icterus. Extraocular muscles intact.  HEENT: Head atraumatic, normocephalic. Oropharynx and nasopharynx clear.  NECK:  Supple, no jugular venous distention. No thyroid enlargement, no tenderness.  LUNGS: Normal breath sounds bilaterally, no wheezing, rales,rhonchi or crepitation. No use of accessory muscles of respiration.  CARDIOVASCULAR: S1, S2 normal. No murmurs, rubs, or gallops.  ABDOMEN: Soft, mild tenderness to palpation throughout, nondistended. Bowel sounds present. No organomegaly or mass.  EXTREMITIES: No pedal edema, cyanosis, or clubbing.  NEUROLOGIC: Cranial nerves II through XII are intact. Muscle strength 5/5 in all extremities. Sensation intact. Gait not checked.  PSYCHIATRIC: The patient is alert and oriented x 3.  SKIN: No obvious rash, lesion, or ulcer.    LABORATORY PANEL:   CBC  Recent Labs Lab 02/18/15 0840   WBC 20.2*  HGB 13.3  HCT 40.8  PLT 188   ------------------------------------------------------------------------------------------------------------------  Chemistries   Recent Labs Lab 02/17/15 1631  NA 141  K 4.8  CL 104  CO2 27  GLUCOSE 131*  BUN 15  CREATININE 0.93  CALCIUM 9.8  AST 30  ALT 22  ALKPHOS 103  BILITOT 0.8   ------------------------------------------------------------------------------------------------------------------  Cardiac Enzymes No results for input(s): TROPONINI in the last 168 hours. ------------------------------------------------------------------------------------------------------------------  RADIOLOGY:  Ct Abdomen Pelvis W Contrast  02/17/2015   CLINICAL DATA:  Abdominal cramping and bright red blood per rectum since this morning. Previous appendectomy and multiple hernia repair.  EXAM: CT ABDOMEN AND PELVIS WITH CONTRAST  TECHNIQUE: Multidetector CT imaging of the abdomen and pelvis was performed using the standard protocol following bolus administration of intravenous contrast.  CONTRAST:  164mL OMNIPAQUE IOHEXOL 300 MG/ML  SOLN  COMPARISON:  11/22/2006  FINDINGS: Lung bases demonstrate mild dependent basilar atelectasis.  Abdominal images demonstrate a normal liver, spleen, pancreas, gallbladder and adrenal glands. Kidneys are normal in size without hydronephrosis or nephrolithiasis. Ureters are within normal.  Moderate wall thickening/ submucosal edema involving the colon from the splenic flexure distally to the sigmoid colon compatible with acute colitis. There is mild adjacent inflammation in the pericolonic fat as well as mild free fluid along the left pericolic gutter. No evidence of perforation. No evidence of obstruction. There is minimal diverticulosis of the colon. Small bowel is within normal.  Moderate calcified plaque over the abdominal aorta. Mesenteric arteries are patent.  Pelvic images demonstrate the bladder and prostate to be  within normal. Rectum is fluid-filled. Minimal free fluid within the pelvis.  There are degenerative  changes of the spine with moderate disc disease at the L3-4 level. Mild gym change of the hips.  IMPRESSION: Moderate acute colitis from the splenic flexure to the sigmoid colon. No evidence of perforation. Mesenteric arteries are patent. Etiology likely infectious or inflammatory in nature.   Electronically Signed   By: Marin Olp M.D.   On: 02/17/2015 20:18    EKG:   Orders placed or performed in visit on 05/08/14  . EKG    ASSESSMENT AND PLAN:   #1 hemorrhagic colitis - CT scan showing thickening of the colon wall, he has had a colonoscopy 2-3 years ago which was normal per his report - Continue Cipro Flagyl, stool studies are pending - Gastroneurology consultation pending - He does have a family history of ulcerative colitis in his - Continue to check CBC every 6 hours  #2 hypertension: Blood pressure well controlled continue Accupril  CODE STATUS: Full  TOTAL TIME TAKING CARE OF THIS PATIENT: 25 minutes.  Greater than 50% of time spent in care coordination and counseling. POSSIBLE D/C IN 2-3 DAYS, DEPENDING ON CLINICAL CONDITION.   [name] M.D on 02/18/2015 at 2:04 PM  Between 7am to 6pm - Pager - [phone]  After 6pm go to [email or web address] - password [email or web address] [facility]  [facility]  Office  [phone]  CC: Primary care physician; Mathews Argyle, MD

## 2015-02-18 NOTE — H&P (Signed)
[facility] at [place] NAME: Brian [name]    MR#:  [id number]  DATE OF BIRTH:  1940/09/13   DATE OF ADMISSION:  02/17/2015  PRIMARY CARE PHYSICIAN: Mathews Argyle, MD   REQUESTING/REFERRING PHYSICIAN: Thomasene Lot  CHIEF COMPLAINT:   Chief Complaint  Patient presents with  . Rectal Bleeding  . Abdominal Pain    HISTORY OF PRESENT ILLNESS:  Brian [name]  is a 74 y.o. male with a known history of coronary artery disease, hyperlipidemia unspecified, essential hypertension presenting with bright red blood per rectum.  He awoke today with lower abdominal cramping suprapubic in location and intensity 8/10 nonradiating no worsening or relieving factors. He subsequently had a bowel movement which she stated was "all red" in the next 2 hours he had repeated bouts of bloody bowel movements. Despite these episodes he denies any chest pain shortness breath or lightheadedness. Given symptoms he actually called his PCP who recommended he go to the hospital.   PAST MEDICAL HISTORY:   Past Medical History  Diagnosis Date  . Sleep apnea   . Myocardial infarction 1998  . Coronary artery disease   . Hyperlipidemia   . Carpal tunnel syndrome on left   . Cervical spine disease   . Hypertension   . Adenomatous polyp   . Low back pain     PAST SURGICAL HISTORY:   Past Surgical History  Procedure Laterality Date  . Appendectomy    . Hernia repair    . Hemorrhoid banding    . Bunions    . Lumbar disc protrussion  03/2010  . Vasectomy    . Cataract extraction      SOCIAL HISTORY:   History  Substance Use Topics  . Smoking status: Current Every Day Smoker -- 1.00 packs/day for 25 years    Types: Cigarettes  . Smokeless tobacco: Not on file     Comment: 1/2 3/4 ppd  . Alcohol Use: Yes     Comment: occasionally    FAMILY HISTORY:   Family History  Problem Relation Age of Onset  . Kidney disease Father   . Hypertension  Father   . Heart failure Mother   . Coronary artery disease Brother     DRUG ALLERGIES:  No Known Allergies  REVIEW OF SYSTEMS:  REVIEW OF SYSTEMS:  CONSTITUTIONAL: Denies fevers, chills, fatigue, weakness.  EYES: Denies blurred vision, double vision, or eye pain.  EARS, NOSE, THROAT: Denies tinnitus, ear pain, hearing loss.  RESPIRATORY: denies cough, shortness of breath, wheezing  CARDIOVASCULAR: Denies chest pain, palpitations, edema.  GASTROINTESTINAL: Denies nausea, vomiting, diarrhea, positive abdominal pain. And Bright red blood per rectum GENITOURINARY: Denies dysuria, hematuria.  ENDOCRINE: Denies nocturia or thyroid problems. HEMATOLOGIC AND LYMPHATIC: Denies easy bruising or bleeding.  SKIN: Denies rash or lesions.  MUSCULOSKELETAL: Denies pain in neck, back, shoulder, knees, hips, or further arthritic symptoms.  NEUROLOGIC: Denies paralysis, paresthesias.  PSYCHIATRIC: Denies anxiety or depressive symptoms. Otherwise full review of systems performed by me is negative.   MEDICATIONS AT HOME:   Prior to Admission medications   Medication Sig Start Date End Date Taking? Authorizing Provider  aspirin 81 MG tablet Take 81 mg by mouth daily.     Yes Historical Provider, MD  atorvastatin (LIPITOR) 40 MG tablet Take 40 mg by mouth daily.     Yes Historical Provider, MD  Coenzyme Q10 (COQ10) 100 MG CAPS Take 1 capsule by mouth daily.     Yes  Historical Provider, MD  multivitamin [facility]) per tablet Take 1 tablet by mouth daily.     Yes Historical Provider, MD  oxyCODONE-acetaminophen (PERCOCET) 10-325 MG per tablet 1 tablet every 6 (six) hours as needed. 05/08/13  Yes Historical Provider, MD  quinapril (ACCUPRIL) 10 MG tablet Take 10 mg by mouth at bedtime.     Yes Historical Provider, MD      VITAL SIGNS:  Blood pressure 107/77, pulse 63, temperature 98.4 F (36.9 C), temperature source Oral, resp. rate 20, SpO2 93 %.  PHYSICAL EXAMINATION:  VITAL SIGNS: Filed  Vitals:   02/17/15 2312  BP: 107/77  Pulse: 63  Temp:   Resp: 20   GENERAL:73 y.o.male currently in no acute distress.  HEAD: Normocephalic, atraumatic.  EYES: Pupils equal, round, reactive to light. Extraocular muscles intact. No scleral icterus.  MOUTH: Moist mucosal membrane. Dentition intact. No abscess noted.  EAR, NOSE, THROAT: Clear without exudates. No external lesions.  NECK: Supple. No thyromegaly. No nodules. No JVD.  PULMONARY: Clear to ascultation, without wheeze rails or rhonci. No use of accessory muscles, Good respiratory effort. good air entry bilaterally CHEST: Nontender to palpation.  CARDIOVASCULAR: S1 and S2. Regular rate and rhythm. No murmurs, rubs, or gallops. No edema. Pedal pulses 2+ bilaterally.  GASTROINTESTINAL: Soft, nontender, nondistended. No masses. Positive bowel sounds. No hepatosplenomegaly.  MUSCULOSKELETAL: No swelling, clubbing, or edema. Range of motion full in all extremities.  NEUROLOGIC: Cranial nerves II through XII are intact. No gross focal neurological deficits. Sensation intact. Reflexes intact.  SKIN: No ulceration, lesions, rashes, or cyanosis. Skin warm and dry. Turgor intact.  PSYCHIATRIC: Mood, affect within normal limits. The patient is awake, alert and oriented x 3. Insight, judgment intact.    LABORATORY PANEL:   CBC  Recent Labs Lab 02/17/15 1631  WBC 22.1*  HGB 16.0  HCT 48.8  PLT 233   ------------------------------------------------------------------------------------------------------------------  Chemistries   Recent Labs Lab 02/17/15 1631  NA 141  K 4.8  CL 104  CO2 27  GLUCOSE 131*  BUN 15  CREATININE 0.93  CALCIUM 9.8  AST 30  ALT 22  ALKPHOS 103  BILITOT 0.8   ------------------------------------------------------------------------------------------------------------------  Cardiac Enzymes No results for input(s): TROPONINI in the last 168  hours. ------------------------------------------------------------------------------------------------------------------  RADIOLOGY:  Ct Abdomen Pelvis W Contrast  02/17/2015   CLINICAL DATA:  Abdominal cramping and bright red blood per rectum since this morning. Previous appendectomy and multiple hernia repair.  EXAM: CT ABDOMEN AND PELVIS WITH CONTRAST  TECHNIQUE: Multidetector CT imaging of the abdomen and pelvis was performed using the standard protocol following bolus administration of intravenous contrast.  CONTRAST:  151mL OMNIPAQUE IOHEXOL 300 MG/ML  SOLN  COMPARISON:  11/22/2006  FINDINGS: Lung bases demonstrate mild dependent basilar atelectasis.  Abdominal images demonstrate a normal liver, spleen, pancreas, gallbladder and adrenal glands. Kidneys are normal in size without hydronephrosis or nephrolithiasis. Ureters are within normal.  Moderate wall thickening/ submucosal edema involving the colon from the splenic flexure distally to the sigmoid colon compatible with acute colitis. There is mild adjacent inflammation in the pericolonic fat as well as mild free fluid along the left pericolic gutter. No evidence of perforation. No evidence of obstruction. There is minimal diverticulosis of the colon. Small bowel is within normal.  Moderate calcified plaque over the abdominal aorta. Mesenteric arteries are patent.  Pelvic images demonstrate the bladder and prostate to be within normal. Rectum is fluid-filled. Minimal free fluid within the pelvis.  There are degenerative changes of the  spine with moderate disc disease at the L3-4 level. Mild gym change of the hips.  IMPRESSION: Moderate acute colitis from the splenic flexure to the sigmoid colon. No evidence of perforation. Mesenteric arteries are patent. Etiology likely infectious or inflammatory in nature.   Electronically Signed   By: Marin Olp M.D.   On: 02/17/2015 20:18    EKG:   Orders placed or performed in visit on 05/08/14  . EKG     IMPRESSION AND PLAN:   74 year old Caucasian gentleman history of essential hypertension, coronary artery disease without angina presents with bright red blood per rectum.  1.Blood per rectum: Hold aspirin further antiplatelets anticoagulates, CBC every 6 hours, transfuse if hemoglobin less than 7 or become symptomatic, consult gastroenterology 2. Colitis, unspecified: Cipro, Flagyl 3. Essential hypertension: Accupril 4. Hyperlipidemia unspecified Lipitor 5. Venous thromboembolism prophylactic: SCDs   All the records are reviewed and case discussed with ED provider. Management plans discussed with the patient, family and they are in agreement.  CODE STATUS: Full  TOTAL TIME TAKING CARE OF THIS PATIENT: 35 minutes.    Ziva Nunziata,  [place]aren[place] Cai.D on 02/18/2015 at 12:19 AM  Between 7am to 6pm - Pager - [phone]  After 6pm: House Pager: - [phone]  [facility]  Office  [phone]  CC: Primary care physician; Mathews Argyle, MD

## 2015-02-19 ENCOUNTER — Inpatient Hospital Stay: Payer: Medicare Other | Admitting: Anesthesiology

## 2015-02-19 ENCOUNTER — Encounter
Admission: EM | Disposition: A | Discharge status: Home / Self Care | Payer: Self-pay | Source: Home / Self Care | Attending: Internal Medicine

## 2015-02-19 DIAGNOSIS — K559 Vascular disorder of intestine, unspecified: Secondary | ICD-10-CM

## 2015-02-19 DIAGNOSIS — K529 Noninfective gastroenteritis and colitis, unspecified: Secondary | ICD-10-CM

## 2015-02-19 DIAGNOSIS — K625 Hemorrhage of anus and rectum: Secondary | ICD-10-CM

## 2015-02-19 HISTORY — PX: COLONOSCOPY WITH PROPOFOL: SHX5780

## 2015-02-19 LAB — BASIC METABOLIC PANEL
ANION GAP: 9 (ref 5–15)
BUN: 12 mg/dL (ref 6–20)
CO2: 25 mmol/L (ref 22–32)
Calcium: 8.2 mg/dL — ABNORMAL LOW (ref 8.9–10.3)
Chloride: 104 mmol/L (ref 101–111)
Creatinine, Ser: 0.77 mg/dL (ref 0.61–1.24)
GFR calc non Af Amer: >60 mL/min (ref >60)
Glucose, Bld: 113 mg/dL — ABNORMAL HIGH (ref 65–99)
Potassium: 3.7 mmol/L (ref 3.5–5.1)
SODIUM: 138 mmol/L (ref 135–145)

## 2015-02-19 LAB — CLOSTRIDIUM DIFFICILE BY PCR: CDIFFPCR: POSITIVE — AB

## 2015-02-19 SURGERY — COLONOSCOPY WITH PROPOFOL
Anesthesia: General

## 2015-02-19 MED ORDER — LIDOCAINE HCL (CARDIAC) 20 MG/ML IV SOLN
INTRAVENOUS | Status: DC | PRN
  Administered 2015-02-19: 60 mg via INTRAVENOUS

## 2015-02-19 MED ORDER — METRONIDAZOLE 500 MG PO TABS
500.0000 mg | ORAL_TABLET | Freq: Three times a day (TID) | ORAL | Status: DC
  Administered 2015-02-19 – 2015-02-21 (×6): 500 mg via ORAL
  Filled 2015-02-19 (×6): qty 1

## 2015-02-19 MED ORDER — MIDAZOLAM HCL 2 MG/2ML IJ SOLN
INTRAMUSCULAR | Status: DC | PRN
  Administered 2015-02-19: 1 mg via INTRAVENOUS

## 2015-02-19 MED ORDER — VANCOMYCIN 50 MG/ML ORAL SOLUTION
125.0000 mg | Freq: Four times a day (QID) | ORAL | Status: DC
  Filled 2015-02-19 (×3): qty 2.5

## 2015-02-19 MED ORDER — PROPOFOL INFUSION 10 MG/ML OPTIME
INTRAVENOUS | Status: DC | PRN
Start: 2015-02-19 — End: 2015-02-19
  Administered 2015-02-19: 140 ug/kg/min via INTRAVENOUS

## 2015-02-19 MED ORDER — SODIUM CHLORIDE 0.9 % IV SOLN
INTRAVENOUS | Status: DC
  Administered 2015-02-19: 1000 mL via INTRAVENOUS

## 2015-02-19 NOTE — Progress Notes (Signed)
[place] [facility] [facility] [place] NAME: Brian [name]    MR#:  [id number]  DATE OF BIRTH:  09-21-1940  SUBJECTIVE:  CHIEF COMPLAINT:   Chief Complaint  Patient presents with  . Rectal Bleeding  . Abdominal Pain   Has had colonoscopy this morning. Continues to have watery bloody stools. Pain is decreased. No nausea or vomiting  REVIEW OF SYSTEMS:   Review of Systems  Constitutional: Negative for fever.  Respiratory: Negative for shortness of breath.   Cardiovascular: Negative for chest pain and palpitations.  Gastrointestinal: Positive for abdominal pain, diarrhea and blood in stool. Negative for nausea and vomiting.  Genitourinary: Negative for dysuria.    DRUG ALLERGIES:  No Known Allergies  VITALS:  Blood pressure 134/67, pulse 68, temperature 98.1 F (36.7 C), temperature source Axillary, resp. rate 16, height 5\' 10"  (1.778 m), weight 70.398 kg (155 lb 3.2 oz), SpO2 97 %.  PHYSICAL EXAMINATION:  GENERAL:  74 y.o.-year-old patient lying in the bed with no acute distress.  LUNGS: Normal breath sounds bilaterally, no wheezing, rales, rhonchi or crepitation. No use of accessory muscles of respiration.  CARDIOVASCULAR: S1, S2 normal. No murmurs, rubs, or gallops.  ABDOMEN: Soft, mild tenderness to palpation throughout, nondistended. Bowel sounds present. No organomegaly or mass.  EXTREMITIES: No pedal edema, cyanosis, or clubbing.  NEUROLOGIC: Cranial nerves II through XII are intact. Muscle strength 5/5 in all extremities.  PSYCHIATRIC: The patient is alert and oriented x 3.  SKIN: No obvious rash, lesion, or ulcer.    LABORATORY PANEL:   CBC  Recent Labs Lab 02/18/15 1955  WBC 24.4*  HGB 14.5  HCT 43.2  PLT 183   ------------------------------------------------------------------------------------------------------------------  Chemistries   Recent Labs Lab 02/17/15 1631 02/19/15 0453  NA 141 138  K 4.8 3.7  CL 104  104  CO2 27 25  GLUCOSE 131* 113*  BUN 15 12  CREATININE 0.93 0.77  CALCIUM 9.8 8.2*  AST 30  --   ALT 22  --   ALKPHOS 103  --   BILITOT 0.8  --    ------------------------------------------------------------------------------------------------------------------  Cardiac Enzymes No results for input(s): TROPONINI in the last 168 hours. ------------------------------------------------------------------------------------------------------------------  RADIOLOGY:  Ct Abdomen Pelvis W Contrast  02/17/2015   CLINICAL DATA:  Abdominal cramping and bright red blood per rectum since this morning. Previous appendectomy and multiple hernia repair.  EXAM: CT ABDOMEN AND PELVIS WITH CONTRAST  TECHNIQUE: Multidetector CT imaging of the abdomen and pelvis was performed using the standard protocol following bolus administration of intravenous contrast.  CONTRAST:  137mL OMNIPAQUE IOHEXOL 300 MG/ML  SOLN  COMPARISON:  11/22/2006  FINDINGS: Lung bases demonstrate mild dependent basilar atelectasis.  Abdominal images demonstrate a normal liver, spleen, pancreas, gallbladder and adrenal glands. Kidneys are normal in size without hydronephrosis or nephrolithiasis. Ureters are within normal.  Moderate wall thickening/ submucosal edema involving the colon from the splenic flexure distally to the sigmoid colon compatible with acute colitis. There is mild adjacent inflammation in the pericolonic fat as well as mild free fluid along the left pericolic gutter. No evidence of perforation. No evidence of obstruction. There is minimal diverticulosis of the colon. Small bowel is within normal.  Moderate calcified plaque over the abdominal aorta. Mesenteric arteries are patent.  Pelvic images demonstrate the bladder and prostate to be within normal. Rectum is fluid-filled. Minimal free fluid within the pelvis.  There are degenerative changes of the spine with moderate disc disease at the L3-4  level. Mild gym change of the  hips.  IMPRESSION: Moderate acute colitis from the splenic flexure to the sigmoid colon. No evidence of perforation. Mesenteric arteries are patent. Etiology likely infectious or inflammatory in nature.   Electronically Signed   By: Marin Olp M.D.   On: 02/17/2015 20:18    EKG:   Orders placed or performed in visit on 05/08/14  . EKG    ASSESSMENT AND PLAN:   #1 hemorrhagic colitis - CT scan showing thickening of the colon wall, colonoscopy with signs of ischemic colitis, also C. difficile positive - Discontinue ciprofloxacin, continue Flagyl oral - Appreciate gastroenterology consultation - He continues to have multiple watery and bloody bowel movements, continue to monitor CBC daily - Advance diet per gastroenterology recommendations  #2 hypertension: Blood pressure slightly low. In light of possible ischemic colitis will stop Isopril.  CODE STATUS: Full  TOTAL TIME TAKING CARE OF THIS PATIENT: 25 minutes.  Greater than 50% of time spent in care coordination and counseling. POSSIBLE D/C IN 2-3 DAYS, DEPENDING ON CLINICAL CONDITION.   [name] M.D on 02/19/2015 at 3:48 PM  Between 7am to 6pm - Pager - [phone]  After 6pm go to [email or web address] - password [email or web address] [facility]  [place] [facility]  Office  [phone]  CC: Primary care physician; Mathews Argyle, MD

## 2015-02-19 NOTE — Anesthesia Preprocedure Evaluation (Signed)
Anesthesia Evaluation  Patient identified by MRN, date of birth, ID band Patient awake    Reviewed: Allergy & Precautions  History of Anesthesia Complications Negative for: history of anesthetic complications  Airway Mallampati: III       Dental  (+) Upper Dentures   Pulmonary sleep apnea , Current Smoker,  + rhonchi   + decreased breath sounds      Cardiovascular hypertension, Pt. on medications + CAD and + Past MI Normal cardiovascular exam    Neuro/Psych    GI/Hepatic negative GI ROS, Neg liver ROS,   Endo/Other  negative endocrine ROS  Renal/GU negative Renal ROS     Musculoskeletal negative musculoskeletal ROS (+)   Abdominal Normal abdominal exam  (+)   Peds negative pediatric ROS (+)  Hematology   Anesthesia Other Findings   Reproductive/Obstetrics negative OB ROS                             Anesthesia Physical Anesthesia Plan  ASA: III  Anesthesia Plan: General   Post-op Pain Management:    Induction: Intravenous  Airway Management Planned: Nasal Cannula  Additional Equipment:   Intra-op Plan:   Post-operative Plan:   Informed Consent: I have reviewed the patients History and Physical, chart, labs and discussed the procedure including the risks, benefits and alternatives for the proposed anesthesia with the patient or authorized representative who has indicated his/her understanding and acceptance.     Plan Discussed with: CRNA  Anesthesia Plan Comments:         Anesthesia Quick Evaluation

## 2015-02-19 NOTE — Addendum Note (Signed)
Addendum  created 02/19/15 1335 by Alvin Critchley, MD   Modules edited: Anesthesia Review and Sign Navigator Section, BPA Follow-up Actions, PRL Based Order Sets

## 2015-02-19 NOTE — Op Note (Signed)
[facility] Gastroenterology Patient Name: Brian [name] Procedure Date: 02/19/2015 12:01 PM MRN: [id number] Account #: [id number] Date of Birth: 06-Mar-1941 Admit Type: Inpatient Age: 74 Room: [facility] ENDO ROOM 4 Gender: Male Note Status: Finalized Procedure:         Colonoscopy Indications:       Periumbilical abdominal pain, Hematochezia Providers:         Lucilla Lame, MD Referring MD:      Adelfa Koh. Stoneking, MD (Referring MD) Medicines:         Propofol per Anesthesia Complications:     No immediate complications. Procedure:         Pre-Anesthesia Assessment:                    - Prior to the procedure, a History and Physical was                     performed, and patient medications and allergies were                     reviewed. The patient's tolerance of previous anesthesia                     was also reviewed. The risks and benefits of the procedure                     and the sedation options and risks were discussed with the                     patient. All questions were answered, and informed consent                     was obtained. Prior Anticoagulants: The patient has taken                     no previous anticoagulant or antiplatelet agents. ASA                     Grade Assessment: II - A patient with mild systemic                     disease. After reviewing the risks and benefits, the                     patient was deemed in satisfactory condition to undergo                     the procedure.                    After obtaining informed consent, the colonoscope was                     passed under direct vision. Throughout the procedure, the                     patient's blood pressure, pulse, and oxygen saturations                     were monitored continuously. The Colonoscope was                     introduced through the anus and advanced to the the  sigmoid colon. The colonoscopy was performed without   difficulty. The patient tolerated the procedure well. The                     quality of the bowel preparation was excellent. Findings:      The perianal and digital rectal examinations were normal.      A continuous area of bleeding ulcerated mucosa with stigmata of recent       bleeding was present in the sigmoid colon. Biopsies were taken with a       cold forceps for histology. Impression:        - Mucosal ulceration. Biopsied.                    - Left-sided ischemic colitis. Recommendation:    - Follow Hb. Procedure Code(s): --- Professional ---                    [phone], Sigmoidoscopy, flexible; with biopsy, single or                     multiple Diagnosis Code(s): --- Professional ---                    K92.1, Melena                    W88.89, Periumbilical pain                    K63.3, Ulcer of intestine                    K55.9, Vascular disorder of intestine, unspecified CPT copyright 2014 American Medical Association. All rights reserved. The codes documented in this report are preliminary and upon coder review may  be revised to meet current compliance requirements. Lucilla Lame, MD 02/19/2015 12:13:22 PM This report has been signed electronically. Number of Addenda: 0 Note Initiated On: 02/19/2015 12:01 PM Total Procedure Duration: 0 hours 4 minutes 47 seconds       [facility]

## 2015-02-19 NOTE — Transfer of Care (Signed)
Immediate Anesthesia Transfer of Care Note  Patient: Brian [name]  Procedure(s) Performed: Procedure(s): COLONOSCOPY WITH PROPOFOL (N/A)  Patient Location: PACU and Endoscopy Unit  Anesthesia Type:General  Level of Consciousness: sedated  Airway & Oxygen Therapy: Patient Spontanous Breathing and Patient connected to nasal cannula oxygen  Post-op Assessment: Report given to RN and Post -op Vital signs reviewed and stable  Post vital signs: Reviewed and stable  Last Vitals: 100% 52 hr 146/60 97.2 temp 18r Filed Vitals:   02/19/15 0828  BP: 128/72  Pulse: 79  Temp: 37.1 C  Resp: 19    Complications: No apparent anesthesia complications

## 2015-02-19 NOTE — Anesthesia Postprocedure Evaluation (Signed)
  Anesthesia Post-op Note  Patient: Brian [name]  Procedure(s) Performed: Procedure(s): COLONOSCOPY WITH PROPOFOL (N/A)  Anesthesia type:General  Patient location: PACU  Post pain: Pain level controlled  Post assessment: Post-op Vital signs reviewed, Patient's Cardiovascular Status Stable, Respiratory Function Stable, Patent Airway and No signs of Nausea or vomiting  Post vital signs: Reviewed and stable  Last Vitals:  Filed Vitals:   02/19/15 1250  BP: 122/79  Pulse: 73  Temp:   Resp: 16    Level of consciousness: awake, alert  and patient cooperative  Complications: No apparent anesthesia complications

## 2015-02-20 LAB — CBC
HCT: 41.3 % (ref 40.0–52.0)
Hemoglobin: 13.7 g/dL (ref 13.0–18.0)
MCH: 29.3 pg (ref 26.0–34.0)
MCHC: 33.3 g/dL (ref 32.0–36.0)
MCV: 88 fL (ref 80.0–100.0)
Platelets: 186 10*3/uL (ref 150–440)
RBC: 4.69 MIL/uL (ref 4.40–5.90)
RDW: 13.5 % (ref 11.5–14.5)
WBC: 19.2 10*3/uL — ABNORMAL HIGH (ref 3.8–10.6)

## 2015-02-20 LAB — BASIC METABOLIC PANEL
Anion gap: 11 (ref 5–15)
BUN: 11 mg/dL (ref 6–20)
CALCIUM: 8.7 mg/dL — AB (ref 8.9–10.3)
CHLORIDE: 104 mmol/L (ref 101–111)
CO2: 25 mmol/L (ref 22–32)
Creatinine, Ser: 0.97 mg/dL (ref 0.61–1.24)
Glucose, Bld: 109 mg/dL — ABNORMAL HIGH (ref 65–99)
Potassium: 4 mmol/L (ref 3.5–5.1)
SODIUM: 140 mmol/L (ref 135–145)

## 2015-02-20 LAB — SURGICAL PATHOLOGY

## 2015-02-20 NOTE — Care Management Important Message (Signed)
Important Message  Patient Details  Name: Brian [name] MRN: [id number] Date of Birth: [birth year]   Medicare Important Message Given:  Yes-second notification given    Alvie Heidelberg, RN 02/20/2015, 9:54 AM

## 2015-02-20 NOTE — Progress Notes (Signed)
[name] at [place] NAME: Brian [name]    MR#:  [id number]  DATE OF BIRTH:  12-18-1940  SUBJECTIVE:  CHIEF COMPLAINT:   Chief Complaint  Patient presents with  . Rectal Bleeding  . Abdominal Pain   Stool frequency improving, but still very thin and with poor control. Some fecal leakage. No further bleeding. No abdominal pain. Is very hungry.  REVIEW OF SYSTEMS:   Review of Systems  Constitutional: Negative for fever.  Respiratory: Negative for shortness of breath.   Cardiovascular: Negative for chest pain and palpitations.  Gastrointestinal: Positive for abdominal pain, diarrhea and blood in stool. Negative for nausea and vomiting.  Genitourinary: Negative for dysuria.    DRUG ALLERGIES:  No Known Allergies  VITALS:  Blood pressure 114/60, pulse 66, temperature 98.4 F (36.9 C), temperature source Oral, resp. rate 18, height 5\' 10"  (1.778 m), weight 70.398 kg (155 lb 3.2 oz), SpO2 97 %.  PHYSICAL EXAMINATION:  GENERAL:  74 y.o.-year-old patient lying in the bed with no acute distress.  LUNGS: Normal breath sounds bilaterally, no wheezing, rales, rhonchi or crepitation. No use of accessory muscles of respiration.  CARDIOVASCULAR: S1, S2 normal. No murmurs, rubs, or gallops.  ABDOMEN: Soft, mild tenderness to palpation throughout, nondistended. Bowel sounds present. No organomegaly or mass.  EXTREMITIES: No pedal edema, cyanosis, or clubbing.  NEUROLOGIC: Cranial nerves II through XII are intact. Muscle strength 5/5 in all extremities.  PSYCHIATRIC: The patient is alert and oriented x 3.  SKIN: No obvious rash, lesion, or ulcer.    LABORATORY PANEL:   CBC  Recent Labs Lab 02/20/15 0634  WBC 19.2*  HGB 13.7  HCT 41.3  PLT 186   ------------------------------------------------------------------------------------------------------------------  Chemistries   Recent Labs Lab 02/17/15 1631  02/20/15 0634  NA  141  < > 140  K 4.8  < > 4.0  CL 104  < > 104  CO2 27  < > 25  GLUCOSE 131*  < > 109*  BUN 15  < > 11  CREATININE 0.93  < > 0.97  CALCIUM 9.8  < > 8.7*  AST 30  --   --   ALT 22  --   --   ALKPHOS 103  --   --   BILITOT 0.8  --   --   < > = values in this interval not displayed. ------------------------------------------------------------------------------------------------------------------  Cardiac Enzymes No results for input(s): TROPONINI in the last 168 hours. ------------------------------------------------------------------------------------------------------------------  RADIOLOGY:  No results found.  EKG:   Orders placed or performed in visit on 05/08/14  . EKG    ASSESSMENT AND PLAN:   #1 Clostridium difficile colitis - White blood cell count decreasing, stool frequency decreasing - Continue Flagyl will need 14 day course  #2 ischemic colitis - Seen on colonoscopy 8/2 by Dr. Allen Norris - No further bleeding, hemoglobin is stable - Further recommendations per gastroenterology - Advance diet  #3 hypertension:  - Blood pressure slightly low, continue to hold lisinopril  CODE STATUS: Full  TOTAL TIME TAKING CARE OF THIS PATIENT: 25 minutes.   POSSIBLE D/C IN 1-2 DAYS, DEPENDING ON CLINICAL CONDITION.   [name] M.D on 02/20/2015 at 2:12 PM  Between 7am to 6pm - Pager - [phone]  After 6pm go to [email or web address] - password [email or web address]  [place] [facility]  Office  [phone]  CC: Primary care physician; Mathews Argyle, MD

## 2015-02-21 ENCOUNTER — Encounter: Payer: Self-pay | Admitting: Gastroenterology

## 2015-02-21 DIAGNOSIS — A0472 Enterocolitis due to Clostridium difficile, not specified as recurrent: Secondary | ICD-10-CM | POA: Diagnosis present

## 2015-02-21 LAB — CBC
HCT: 39.3 % — ABNORMAL LOW (ref 40.0–52.0)
Hemoglobin: 12.9 g/dL — ABNORMAL LOW (ref 13.0–18.0)
MCH: 28.8 pg (ref 26.0–34.0)
MCHC: 32.8 g/dL (ref 32.0–36.0)
MCV: 87.8 fL (ref 80.0–100.0)
Platelets: 197 10*3/uL (ref 150–440)
RBC: 4.48 MIL/uL (ref 4.40–5.90)
RDW: 13.8 % (ref 11.5–14.5)
WBC: 12.9 10*3/uL — AB (ref 3.8–10.6)

## 2015-02-21 MED ORDER — METRONIDAZOLE 500 MG PO TABS: 500.0000 mg | ORAL_TABLET | Freq: Three times a day (TID) | ORAL | Status: DC

## 2015-02-21 NOTE — Discharge Instructions (Signed)
Hold enalapril until you see Dr. Felipa Eth.   DIET:  Regular diet  DISCHARGE CONDITION:  Stable  ACTIVITY:  Activity as tolerated  OXYGEN:  Home Oxygen: No.   Oxygen Delivery: room air  DISCHARGE LOCATION:  home   If you experience worsening of your admission symptoms, develop shortness of breath, life threatening emergency, suicidal or homicidal thoughts you must seek medical attention immediately by calling 911 or calling your MD immediately  if symptoms less severe.  You Must read complete instructions/literature along with all the possible adverse reactions/side effects for all the Medicines you take and that have been prescribed to you. Take any new Medicines after you have completely understood and accpet all the possible adverse reactions/side effects.   Please note  You were cared for by a hospitalist during your hospital stay. If you have any questions about your discharge medications or the care you received while you were in the hospital after you are discharged, you can call the unit and asked to speak with the hospitalist on call if the hospitalist that took care of you is not available. Once you are discharged, your primary care physician will handle any further medical issues. Please note that NO REFILLS for any discharge medications will be authorized once you are discharged, as it is imperative that you return to your primary care physician (or establish a relationship with a primary care physician if you do not have one) for your aftercare needs so that they can reassess your need for medications and monitor your lab values.

## 2015-02-21 NOTE — Progress Notes (Signed)
Pt alert and oriented. VSS. Notified MD of patient feeling "fuzzy" headed this am. Concerns addressed. IV site removed. Discharge Summary given.

## 2015-02-25 NOTE — Discharge Summary (Signed)
[facility] [place]   PATIENT NAME: Brian [name]    MR#:  [id number]  DATE OF BIRTH:  27-Jul-1940  DATE OF ADMISSION:  02/17/2015 ADMITTING PHYSICIAN: Brian Butte, MD  DATE OF DISCHARGE: 02/21/15  PRIMARY CARE PHYSICIAN: Brian Argyle, MD    ADMISSION DIAGNOSIS:  Colitis [K52.9] Leukocytosis [D72.829] Rectal bleeding [K62.5]  DISCHARGE DIAGNOSIS:  Principal Problem:   Ischemic bowel disease Active Problems:   Enteritis due to Clostridium difficile   SECONDARY DIAGNOSIS:   Past Medical History  Diagnosis Date  . Sleep apnea   . Myocardial infarction 1998  . Coronary artery disease   . Hyperlipidemia   . Carpal tunnel syndrome on left   . Cervical spine disease   . Hypertension   . Adenomatous polyp   . Low back pain     HOSPITAL COURSE:   #1 ischemic colitis: Initial presentation with voluminous bloody stools. He had colonoscopy on 02/19/2015 by Brian [name]. Colonoscopy findings significant for ischemic type changes. He has been advised to stop smoking as this is a significant risk factor for ischemic colitis. He will continue on aspirin and statin therapy. Will follow up with Brian [name] in 2 weeks to discuss colonoscopy and further treatment.  #2 Clostridium difficile colitis: In addition to ischemic colitis, his stool studies were positive for clostridium difficile colitis. He was started on oral Flagyl and will need to complete a 14 day course. He did have a significantly elevated white blood cell count greater than 20,000 which is now decreasing.  Stool frequency is decreasing.  #3 gastrointestinal hemorrhage: Due to ischemic colitis and Clostridium difficile. Hemoglobin decreased from 14.5-12.9 during the admission. At the time of discharge she is not seeing any further bleeding with bowel movements. Stool frequency has decreased significantly. No abdominal pain He will need to follow-up with his  primary care provider for CBC within the next 2 weeks.  #4 hypertension: Blood pressure had been fairly low throughout the hospitalization. At discharge she was asked to hold his lisinopril until he could see his primary care provider.  #5 ongoing tobacco abuse: Smoking cessation counseling provided.  #6 coronary artery disease: No chest pain during the admission. Continue aspirin and statin.  DISCHARGE CONDITIONS:   Stable  CONSULTS OBTAINED:  Treatment Team:  Brian Butte, MD Brian Lame, MD  DRUG ALLERGIES:  No Known Allergies  DISCHARGE MEDICATIONS:   Discharge Medication List as of 02/21/2015 12:41 PM    START taking these medications   Details  metroNIDAZOLE (FLAGYL) 500 MG tablet Take 1 tablet (500 mg total) by mouth every 8 (eight) hours., Starting 02/21/2015, Until Discontinued, Print      CONTINUE these medications which have NOT CHANGED   Details  aspirin 81 MG tablet Take 81 mg by mouth daily.  , Until Discontinued, Historical Med    atorvastatin (LIPITOR) 40 MG tablet Take 40 mg by mouth daily.  , Until Discontinued, Historical Med    Coenzyme Q10 (COQ10) 100 MG CAPS Take 1 capsule by mouth daily.  , Until Discontinued, Historical Med    multivitamin (THERAGRAN) per tablet Take 1 tablet by mouth daily.  , Until Discontinued, Historical Med      STOP taking these medications     oxyCODONE-acetaminophen (PERCOCET) 10-325 MG per tablet      quinapril (ACCUPRIL) 10 MG tablet          DISCHARGE INSTRUCTIONS:   Hold enalapril until you see Dr.  Emmett.   DIET:  Regular diet  DISCHARGE CONDITION:  Stable  ACTIVITY:  Activity as tolerated  OXYGEN:  Home Oxygen: No.   Oxygen Delivery: room air  DISCHARGE LOCATION:  home   If you experience worsening of your admission symptoms, develop shortness of breath, life threatening emergency, suicidal or homicidal thoughts you must seek medical attention immediately by calling 911 or calling your MD  immediately  if symptoms less severe.  You Must read complete instructions/literature along with all the possible adverse reactions/side effects for all the Medicines you take and that have been prescribed to you. Take any new Medicines after you have completely understood and accpet all the possible adverse reactions/side effects.   Please note  You were cared for by a hospitalist during your hospital stay. If you have any questions about your discharge medications or the care you received while you were in the hospital after you are discharged, you can call the unit and asked to speak with the hospitalist on call if the hospitalist that took care of you is not available. Once you are discharged, your primary care physician will handle any further medical issues. Please note that NO REFILLS for any discharge medications will be authorized once you are discharged, as it is imperative that you return to your primary care physician (or establish a relationship with a primary care physician if you do not have one) for your aftercare needs so that they can reassess your need for medications and monitor your lab values.   Today   CHIEF COMPLAINT:   Chief Complaint  Patient presents with  . Rectal Bleeding  . Abdominal Pain    HISTORY OF PRESENT ILLNESS:  Brian [name] is a 74 y.o. male with a known history of coronary artery disease, hyperlipidemia unspecified, essential hypertension presenting with bright red blood per rectum.  He awoke today with lower abdominal cramping suprapubic in location and intensity 8/10 nonradiating no worsening or relieving factors. He subsequently had a bowel movement which she stated was "all red" in the next 2 hours he had repeated bouts of bloody bowel movements. Despite these episodes he denies any chest pain shortness breath or lightheadedness. Given symptoms he actually called his PCP who recommended he go to the hospital.  VITAL SIGNS:  Blood pressure 109/77,  pulse 74, temperature 97.9 F (36.6 C), temperature source Oral, resp. rate 18, height 5\' 10"  (1.778 m), weight 70.398 kg (155 lb 3.2 oz), SpO2 98 %.  I/O:  No intake or output data in the 24 hours ending 02/25/15 1406  PHYSICAL EXAMINATION:  GENERAL:  74 y.o.-year-old patient up moving around the room with no distress LUNGS: Normal breath sounds bilaterally, no wheezing, rales,rhonchi or crepitation. No use of accessory muscles of respiration.  CARDIOVASCULAR: S1, S2 normal. No murmurs, rubs, or gallops.  ABDOMEN: Soft, non-tender, non-distended. Bowel sounds present. No organomegaly or mass.  EXTREMITIES: No pedal edema, cyanosis, or clubbing.  NEUROLOGIC: Cranial nerves II through XII are intact. Muscle strength 5/5 in all extremities. Sensation intact. Gait not checked.  PSYCHIATRIC: The patient is alert and oriented x 3.  SKIN: No obvious rash, lesion, or ulcer.   DATA REVIEW:   CBC  Recent Labs Lab 02/21/15 0528  WBC 12.9*  HGB 12.9*  HCT 39.3*  PLT 197    Chemistries   Recent Labs Lab 02/20/15 0634  NA 140  K 4.0  CL 104  CO2 25  GLUCOSE 109*  BUN 11  CREATININE 0.97  CALCIUM  8.7*    Cardiac Enzymes No results for input(s): TROPONINI in the last 168 hours.  Microbiology Results  Results for orders placed or performed during the hospital encounter of 02/17/15  C difficile quick scan w PCR reflex ([facility] only)     Status: Abnormal   Collection Time: 02/18/15  5:28 PM  Result Value Ref Range Status   C Diff antigen POSITIVE (A) NEGATIVE Final   C Diff toxin NEGATIVE NEGATIVE Final   C Diff interpretation   Final    Positive for toxigenic C. difficile, active toxin production present.    Comment: READ BACK AND VERIFIED BY STACEY CLAY @2245  02/18/15.Marland KitchenMarland KitchenAJO  Clostridium Difficile by PCR     Status: Abnormal   Collection Time: 02/18/15  5:28 PM  Result Value Ref Range Status   Toxigenic C Difficile by pcr POSITIVE (A) NEGATIVE Final    Comment: CRITICAL RESULT  CALLED TO, READ BACK BY AND VERIFIED WITH: STACEY CLAY ON 02/18/15 AT 2245 BY AJO     RADIOLOGY:  No results found.  EKG:   Orders placed or performed in visit on 05/08/14  . EKG      Management plans discussed with the patient, family and they are in agreement.  CODE STATUS: Full  TOTAL TIME TAKING CARE OF THIS PATIENT: 35 minutes.  Greater than 50% of time spent in care coordination and counseling.  [name] M.D on 02/25/2015 at 2:06 PM  Between 7am to 6pm - Pager - [phone]  After 6pm go to [email or web address] - password [email or web address] [facility]  [facility]  Office  [phone]  CC: Primary care physician; Brian Argyle, MD

## 2015-03-18 ENCOUNTER — Ambulatory Visit (INDEPENDENT_AMBULATORY_CARE_PROVIDER_SITE_OTHER): Payer: Medicare Other | Admitting: Gastroenterology

## 2015-03-18 ENCOUNTER — Encounter: Payer: Self-pay | Admitting: Gastroenterology

## 2015-03-18 VITALS — BP 112/68 | HR 78 | Temp 98.3°F | Ht 70.0 in | Wt 160.0 lb

## 2015-03-18 DIAGNOSIS — K559 Vascular disorder of intestine, unspecified: Secondary | ICD-10-CM

## 2015-03-18 NOTE — Progress Notes (Signed)
Primary Care Physician: Brian Argyle, MD  Primary Gastroenterologist:  Dr. Lucilla [name]  Chief Complaint  Patient presents with  . Blood In Stools    HPI: Brian [name] is a 74 y.o. male here for follow-up after having been admitted to the hospital for rectal bleeding. The patient underwent a colonoscopy which ended up only being a flexible sigmoidoscopy due to severe ischemic colitis in the left side of his colon. The patient had biopsies taken at that time which also showed him to have ischemic colitis. The patient interestingly was found to have C. difficile positive during his hospital stay although he has not had any diarrhea or abdominal pain since being discharged from the hospital. The patient has been doing well otherwise and has no diarrhea or constipation present time.  Current Outpatient Prescriptions  Medication Sig Dispense Refill  . aspirin 81 MG tablet Take 81 mg by mouth daily.      Marland Kitchen atorvastatin (LIPITOR) 40 MG tablet Take 40 mg by mouth daily.      . Coenzyme Q10 (COQ10) 100 MG CAPS Take 1 capsule by mouth daily.      . multivitamin (THERAGRAN) per tablet Take 1 tablet by mouth daily.      Marland Kitchen oxyCODONE-acetaminophen (PERCOCET/ROXICET) 5-325 MG per tablet Take by mouth every 4 (four) hours as needed for severe pain.    Marland Kitchen quinapril (ACCUPRIL) 10 MG tablet Take 10 mg by mouth daily.     No current facility-administered medications for this visit.    Allergies as of 03/18/2015  . (No Known Allergies)    ROS:  General: Negative for anorexia, weight loss, fever, chills, fatigue, weakness. ENT: Negative for hoarseness, difficulty swallowing , nasal congestion. CV: Negative for chest pain, angina, palpitations, dyspnea on exertion, peripheral edema.  Respiratory: Negative for dyspnea at rest, dyspnea on exertion, cough, sputum, wheezing.  GI: See history of present illness. GU:  Negative for dysuria, hematuria, urinary incontinence, urinary frequency,  nocturnal urination.  Endo: Negative for unusual weight change.    Physical Examination:   BP 112/68 mmHg  Pulse 78  Temp(Src) 98.3 F (36.8 C) (Oral)  Ht 5\' 10"  (1.778 m)  Wt 160 lb (72.576 kg)  BMI 22.96 kg/m2  General: Well-nourished, well-developed in no acute distress.  Eyes: No icterus. Conjunctivae pink. Mouth: Oropharyngeal mucosa moist and pink , no lesions erythema or exudate. Lungs: Clear to auscultation bilaterally. Non-labored. Heart: Regular rate and rhythm, no murmurs rubs or gallops.  Abdomen: Bowel sounds are normal, nontender, nondistended, no hepatosplenomegaly or masses, no abdominal bruits or hernia , no rebound or guarding.   Extremities: No lower extremity edema. No clubbing or deformities. Neuro: Alert and oriented x 3.  Grossly intact. Skin: Warm and dry, no jaundice.   Psych: Alert and cooperative, normal mood and affect.  Labs:    Imaging Studies: Ct Abdomen Pelvis W Contrast  02/17/2015   CLINICAL DATA:  Abdominal cramping and bright red blood per rectum since this morning. Previous appendectomy and multiple hernia repair.  EXAM: CT ABDOMEN AND PELVIS WITH CONTRAST  TECHNIQUE: Multidetector CT imaging of the abdomen and pelvis was performed using the standard protocol following bolus administration of intravenous contrast.  CONTRAST:  157mL OMNIPAQUE IOHEXOL 300 MG/ML  SOLN  COMPARISON:  11/22/2006  FINDINGS: Lung bases demonstrate mild dependent basilar atelectasis.  Abdominal images demonstrate a normal liver, spleen, pancreas, gallbladder and adrenal glands. Kidneys are normal in size without hydronephrosis or nephrolithiasis. Ureters are within normal.  Moderate  wall thickening/ submucosal edema involving the colon from the splenic flexure distally to the sigmoid colon compatible with acute colitis. There is mild adjacent inflammation in the pericolonic fat as well as mild free fluid along the left pericolic gutter. No evidence of perforation. No evidence  of obstruction. There is minimal diverticulosis of the colon. Small bowel is within normal.  Moderate calcified plaque over the abdominal aorta. Mesenteric arteries are patent.  Pelvic images demonstrate the bladder and prostate to be within normal. Rectum is fluid-filled. Minimal free fluid within the pelvis.  There are degenerative changes of the spine with moderate disc disease at the L3-4 level. Mild gym change of the hips.  IMPRESSION: Moderate acute colitis from the splenic flexure to the sigmoid colon. No evidence of perforation. Mesenteric arteries are patent. Etiology likely infectious or inflammatory in nature.   Electronically Signed   By: Marin Olp M.D.   On: 02/17/2015 20:18    Assessment and Plan:   Brian [name] is a 74 y.o. y/o male who comes in today for follow-up after being discharged from the hospital for about of ischemic colitis. The patient has been told to stay well-hydrated and avoid constipation which can predispose him to ischemic class. The patient has also stopped smoking since being discharged from the hospital and the patient has been told that this can contribute to ischemia. He does report that he has been having some back pain with some stranding changes in his right foot. He states he has informed his primary care provider who had sent him up for an MRI. I suggested that the patient see a orthopedist because of decreased strength in the right leg. He states that he is well established with his orthopedic physician and can get in very quickly to see him.   Note: This dictation was prepared with Dragon dictation along with smaller phrase technology. Any transcriptional errors that result from this process are unintentional.

## 2015-03-20 ENCOUNTER — Other Ambulatory Visit: Payer: Self-pay | Admitting: Geriatric Medicine

## 2015-03-20 DIAGNOSIS — M21371 Foot drop, right foot: Secondary | ICD-10-CM

## 2015-03-23 ENCOUNTER — Ambulatory Visit
Admission: RE | Admit: 2015-03-23 | Discharge: 2015-03-23 | Disposition: A | Discharge status: Home / Self Care | Payer: Medicare Other | Source: Ambulatory Visit | Attending: Geriatric Medicine | Admitting: Geriatric Medicine

## 2015-03-23 DIAGNOSIS — M21371 Foot drop, right foot: Secondary | ICD-10-CM

## 2015-05-03 ENCOUNTER — Telehealth: Payer: Self-pay | Admitting: Interventional Cardiology

## 2015-05-03 DIAGNOSIS — I2510 Atherosclerotic heart disease of native coronary artery without angina pectoris: Secondary | ICD-10-CM

## 2015-05-03 DIAGNOSIS — Z136 Encounter for screening for cardiovascular disorders: Secondary | ICD-10-CM

## 2015-05-03 NOTE — Telephone Encounter (Signed)
Patient will bring in a copy by the office for medical records and Dr. Tamala Julian to see on Tuesday. Will forward to Dr. Tamala Julian so he is aware about results.

## 2015-05-03 NOTE — Telephone Encounter (Signed)
Left message to call back  

## 2015-05-03 NOTE — Telephone Encounter (Signed)
New message     Pt had an xray of his lower back.  It showed moderate calcification of the abd aortia.  The doctor is Dr  Glade Nurse at [facility]--[phone]----fax number is [phone] if you want to get a copy.  Patient has copy of results

## 2015-05-05 NOTE — Telephone Encounter (Signed)
Needs abdominal aortic untrasound

## 2015-05-06 NOTE — Telephone Encounter (Signed)
Called patient about Dr. Thompson Caul request for ultrasound. Patient verbalized understanding. I will send message to have test scheduled.

## 2015-05-08 ENCOUNTER — Encounter: Payer: Self-pay | Admitting: Interventional Cardiology

## 2015-05-08 DIAGNOSIS — I700 Atherosclerosis of aorta: Secondary | ICD-10-CM | POA: Insufficient documentation

## 2015-05-09 ENCOUNTER — Other Ambulatory Visit: Payer: Self-pay | Admitting: Interventional Cardiology

## 2015-05-09 DIAGNOSIS — I700 Atherosclerosis of aorta: Secondary | ICD-10-CM

## 2015-05-09 DIAGNOSIS — I2510 Atherosclerotic heart disease of native coronary artery without angina pectoris: Secondary | ICD-10-CM

## 2015-05-09 DIAGNOSIS — Z136 Encounter for screening for cardiovascular disorders: Secondary | ICD-10-CM

## 2015-05-16 ENCOUNTER — Ambulatory Visit (HOSPITAL_COMMUNITY)
Admission: RE | Admit: 2015-05-16 | Discharge: 2015-05-16 | Disposition: A | Discharge status: Home / Self Care | Payer: Medicare Other | Source: Ambulatory Visit | Attending: Cardiology | Admitting: Cardiology

## 2015-05-16 DIAGNOSIS — I700 Atherosclerosis of aorta: Secondary | ICD-10-CM

## 2015-05-16 DIAGNOSIS — Z136 Encounter for screening for cardiovascular disorders: Secondary | ICD-10-CM | POA: Diagnosis not present

## 2015-05-16 DIAGNOSIS — I2510 Atherosclerotic heart disease of native coronary artery without angina pectoris: Secondary | ICD-10-CM

## 2015-05-20 ENCOUNTER — Telehealth: Payer: Self-pay

## 2015-05-20 NOTE — Telephone Encounter (Signed)
Pt aware of AAA duplex result with verbal understanding. Study is ok

## 2015-05-20 NOTE — Telephone Encounter (Signed)
-----   Message from Belva Crome, MD sent at 05/16/2015  6:49 PM EDT ----- Study is okay.

## 2015-07-31 ENCOUNTER — Other Ambulatory Visit: Payer: Self-pay | Admitting: Geriatric Medicine

## 2015-07-31 DIAGNOSIS — F17210 Nicotine dependence, cigarettes, uncomplicated: Secondary | ICD-10-CM

## 2015-08-09 ENCOUNTER — Ambulatory Visit
Admission: RE | Admit: 2015-08-09 | Discharge: 2015-08-09 | Disposition: A | Discharge status: Home / Self Care | Payer: Medicare Other | Source: Ambulatory Visit | Attending: Geriatric Medicine | Admitting: Geriatric Medicine

## 2015-08-09 DIAGNOSIS — F17210 Nicotine dependence, cigarettes, uncomplicated: Secondary | ICD-10-CM

## 2015-11-25 ENCOUNTER — Ambulatory Visit (INDEPENDENT_AMBULATORY_CARE_PROVIDER_SITE_OTHER): Payer: Medicare Other | Admitting: Interventional Cardiology

## 2015-11-25 ENCOUNTER — Encounter: Payer: Self-pay | Admitting: Interventional Cardiology

## 2015-11-25 VITALS — BP 130/80 | HR 72 | Ht 69.0 in | Wt 162.2 lb

## 2015-11-25 DIAGNOSIS — F17200 Nicotine dependence, unspecified, uncomplicated: Secondary | ICD-10-CM

## 2015-11-25 DIAGNOSIS — I2510 Atherosclerotic heart disease of native coronary artery without angina pectoris: Secondary | ICD-10-CM

## 2015-11-25 DIAGNOSIS — I700 Atherosclerosis of aorta: Secondary | ICD-10-CM

## 2015-11-25 DIAGNOSIS — R55 Syncope and collapse: Secondary | ICD-10-CM

## 2015-11-25 DIAGNOSIS — E785 Hyperlipidemia, unspecified: Secondary | ICD-10-CM | POA: Diagnosis not present

## 2015-11-25 NOTE — Progress Notes (Signed)
Cardiology Office Note   Date:  11/25/2015   ID:  [name], DOB [birth year], MRN [id number]  PCP:  Mathews Argyle, MD  Cardiologist:  Sinclair Grooms, MD   Chief Complaint  Patient presents with  . Coronary Artery Disease      History of Present Illness: Brian [name] is a 75 y.o. male who presents for CAD, previous anterior myocardial infarction treated with angioplasty, exercise treadmill test September 2015 without evidence of ischemia, hypertension, hyperlipidemia, and cigarette smoking. Also has a history of sleep apnea.  Overall patient is doing well. He denies cardiac complaints. His major difficulty has to do with lumbar disc disease. He has not is physically active anymore because of his back problem. He states that several surgeons both orthopedic and neurosurgery have recommended an operation but he has refused.  He denies orthopnea, PND, and chest pain.  Past Medical History  Diagnosis Date  . Sleep apnea   . Myocardial infarction ([place]) 1998  . Coronary artery disease   . Hyperlipidemia   . Carpal tunnel syndrome on left   . Cervical spine disease   . Hypertension   . Adenomatous polyp   . Low back pain     Past Surgical History  Procedure Laterality Date  . Appendectomy    . Hernia repair    . Hemorrhoid banding    . Bunions    . Lumbar disc protrussion  03/2010  . Cataract extraction    . Colonoscopy with propofol N/A 02/19/2015    Procedure: COLONOSCOPY WITH PROPOFOL;  Surgeon: Lucilla Lame, MD;  Location: [facility] ENDOSCOPY;  Service: Endoscopy;  Laterality: N/A;     Current Outpatient Prescriptions  Medication Sig Dispense Refill  . aspirin 81 MG tablet Take 81 mg by mouth daily.      Marland Kitchen atorvastatin (LIPITOR) 40 MG tablet Take 40 mg by mouth daily.      . Coenzyme Q10 (COQ10) 100 MG CAPS Take 1 capsule by mouth daily.      . fluticasone (FLONASE) 50 MCG/ACT nasal spray Place 2 sprays into both nostrils at bedtime.  0  . multivitamin  (THERAGRAN) per tablet Take 1 tablet by mouth daily.      Marland Kitchen oxyCODONE-acetaminophen (PERCOCET) 10-325 MG tablet Take one to two tablets by mouth every 4 to 6 hours as needed for pain.    Marland Kitchen quinapril (ACCUPRIL) 10 MG tablet Take 10 mg by mouth daily.     No current facility-administered medications for this visit.    Allergies:   Review of patient's allergies indicates no known allergies.    Social History:  The patient  reports that he has quit smoking. His smoking use included Cigarettes. He has a 25 pack-year smoking history. He has never used smokeless tobacco. He reports that he drinks alcohol. He reports that he does not use illicit drugs.   Family History:  The patient's family history includes Coronary artery disease in his brother; Heart failure in his mother; Hypertension in his father; Kidney disease in his father.    ROS:  Please see the history of present illness.   Otherwise, review of systems are positive for he has fleeting migratory chest pains that last seconds before resolving. Chronic low back pain. Occasional hemorrhoidal bleeding..   All other systems are reviewed and negative.    PHYSICAL EXAM: VS:  BP 130/80 mmHg  Pulse 72  Ht 5\' 9"  (1.753 m)  Wt 162 lb 3.2 oz (73.573 kg)  BMI  23.94 kg/m2 , BMI Body mass index is 23.94 kg/(m^2). GEN: Well nourished, well developed, in no acute distress HEENT: normal Neck: no JVD, carotid bruits, or masses Cardiac: R RR.  There is no murmur, rub, or gallop. There is no edema. Respiratory:  clear to auscultation bilaterally, normal work of breathing. GI: soft, nontender, nondistended, + BS MS: no deformity or atrophy Skin: warm and dry, no rash Neuro:  Strength and sensation are intact Psych: euthymic mood, full affect   EKG:  EKG is ordered today. The ekg reveals normal sinus rhythm, left atrial abnormality, left anterior hemiblock, QS pattern V1 through V3 consistent with anteroseptal infarct.   Recent Labs: 02/17/2015:  ALT 22 02/20/2015: BUN 11; Creatinine, Ser 0.97; Potassium 4.0; Sodium 140 02/21/2015: Hemoglobin 12.9*; Platelets 197    Lipid Panel No results found for: CHOL, TRIG, HDL, CHOLHDL, VLDL, LDLCALC, LDLDIRECT    Wt Readings from Last 3 Encounters:  11/25/15 162 lb 3.2 oz (73.573 kg)  03/18/15 160 lb (72.576 kg)  02/18/15 155 lb 3.2 oz (70.398 kg)      Other studies Reviewed: Additional studies/ records that were reviewed today include: No new or updated health clinic information available.. The findings include none.    ASSESSMENT AND PLAN:  1. Atherosclerosis of native coronary artery of native heart without angina pectoris Asymptomatic  2. Atherosclerosis of aorta (HCC) No evidence of aneurysm when evaluated one year ago.  3. Hyperlipidemia Followed by primary care  4. Vasovagal near-syncope No recurrence of syncope  5. TOBACCO USER Continues to smoke cigarettes    Current medicines are reviewed at length with the patient today.  The patient has the following concerns regarding medicines: None.  The following changes/actions have been instituted:    Strong encouragement to discontinue smoking. He is not willing to commit.  Aerobic activity as tolerated  Labs/ tests ordered today include:  No orders of the defined types were placed in this encounter.     Disposition:   FU with HS in 1 year  Signed, Sinclair Grooms, MD  11/25/2015 4:15 PM    [facility] [place], [place], [place]  [address] Phone: [phone]; Fax: [phone]

## 2015-11-25 NOTE — Patient Instructions (Signed)

## 2016-02-14 ENCOUNTER — Observation Stay
Admission: EM | Admit: 2016-02-14 | Discharge: 2016-02-16 | Disposition: A | Discharge status: Home / Self Care | Payer: Medicare Other | Attending: Internal Medicine | Admitting: Internal Medicine

## 2016-02-14 ENCOUNTER — Encounter: Payer: Self-pay | Admitting: Emergency Medicine

## 2016-02-14 DIAGNOSIS — Z86010 Personal history of colonic polyps: Secondary | ICD-10-CM | POA: Insufficient documentation

## 2016-02-14 DIAGNOSIS — I252 Old myocardial infarction: Secondary | ICD-10-CM | POA: Insufficient documentation

## 2016-02-14 DIAGNOSIS — Z8249 Family history of ischemic heart disease and other diseases of the circulatory system: Secondary | ICD-10-CM | POA: Insufficient documentation

## 2016-02-14 DIAGNOSIS — I10 Essential (primary) hypertension: Secondary | ICD-10-CM | POA: Insufficient documentation

## 2016-02-14 DIAGNOSIS — M5136 Other intervertebral disc degeneration, lumbar region: Secondary | ICD-10-CM | POA: Diagnosis not present

## 2016-02-14 DIAGNOSIS — G4710 Hypersomnia, unspecified: Secondary | ICD-10-CM | POA: Diagnosis not present

## 2016-02-14 DIAGNOSIS — K625 Hemorrhage of anus and rectum: Secondary | ICD-10-CM | POA: Diagnosis present

## 2016-02-14 DIAGNOSIS — K529 Noninfective gastroenteritis and colitis, unspecified: Secondary | ICD-10-CM | POA: Diagnosis not present

## 2016-02-14 DIAGNOSIS — I700 Atherosclerosis of aorta: Secondary | ICD-10-CM | POA: Diagnosis not present

## 2016-02-14 DIAGNOSIS — N400 Enlarged prostate without lower urinary tract symptoms: Secondary | ICD-10-CM | POA: Insufficient documentation

## 2016-02-14 DIAGNOSIS — Z87891 Personal history of nicotine dependence: Secondary | ICD-10-CM | POA: Diagnosis not present

## 2016-02-14 DIAGNOSIS — Z9849 Cataract extraction status, unspecified eye: Secondary | ICD-10-CM | POA: Insufficient documentation

## 2016-02-14 DIAGNOSIS — Z841 Family history of disorders of kidney and ureter: Secondary | ICD-10-CM | POA: Diagnosis not present

## 2016-02-14 DIAGNOSIS — E785 Hyperlipidemia, unspecified: Secondary | ICD-10-CM | POA: Diagnosis present

## 2016-02-14 DIAGNOSIS — G5602 Carpal tunnel syndrome, left upper limb: Secondary | ICD-10-CM | POA: Diagnosis not present

## 2016-02-14 DIAGNOSIS — I2510 Atherosclerotic heart disease of native coronary artery without angina pectoris: Secondary | ICD-10-CM | POA: Insufficient documentation

## 2016-02-14 DIAGNOSIS — K559 Vascular disorder of intestine, unspecified: Secondary | ICD-10-CM

## 2016-02-14 DIAGNOSIS — G4730 Sleep apnea, unspecified: Secondary | ICD-10-CM | POA: Insufficient documentation

## 2016-02-14 DIAGNOSIS — Z7982 Long term (current) use of aspirin: Secondary | ICD-10-CM | POA: Insufficient documentation

## 2016-02-14 DIAGNOSIS — Z79899 Other long term (current) drug therapy: Secondary | ICD-10-CM | POA: Insufficient documentation

## 2016-02-14 LAB — COMPREHENSIVE METABOLIC PANEL
ALBUMIN: 4.3 g/dL (ref 3.5–5.0)
ALT: 18 U/L (ref 17–63)
AST: 26 U/L (ref 15–41)
Alkaline Phosphatase: 91 U/L (ref 38–126)
Anion gap: 10 (ref 5–15)
BUN: 18 mg/dL (ref 6–20)
CO2: 21 mmol/L — ABNORMAL LOW (ref 22–32)
Calcium: 9.5 mg/dL (ref 8.9–10.3)
Chloride: 107 mmol/L (ref 101–111)
Creatinine, Ser: 0.95 mg/dL (ref 0.61–1.24)
GFR calc Af Amer: >60 mL/min (ref >60)
GFR calc non Af Amer: >60 mL/min (ref >60)
Glucose, Bld: 111 mg/dL — ABNORMAL HIGH (ref 65–99)
POTASSIUM: 4.2 mmol/L (ref 3.5–5.1)
Sodium: 138 mmol/L (ref 135–145)
TOTAL PROTEIN: 7.7 g/dL (ref 6.5–8.1)
Total Bilirubin: 1.1 mg/dL (ref 0.3–1.2)

## 2016-02-14 LAB — HEMOGLOBIN AND HEMATOCRIT, BLOOD
HCT: 44.2 % (ref 40.0–52.0)
Hemoglobin: 14.7 g/dL (ref 13.0–18.0)

## 2016-02-14 LAB — CBC
HEMATOCRIT: 47.7 % (ref 40.0–52.0)
Hemoglobin: 15.7 g/dL (ref 13.0–18.0)
MCH: 29.2 pg (ref 26.0–34.0)
MCHC: 32.8 g/dL (ref 32.0–36.0)
MCV: 89 fL (ref 80.0–100.0)
Platelets: 230 10*3/uL (ref 150–440)
RBC: 5.36 MIL/uL (ref 4.40–5.90)
RDW: 13.3 % (ref 11.5–14.5)
WBC: 15.3 10*3/uL — ABNORMAL HIGH (ref 3.8–10.6)

## 2016-02-14 LAB — LACTIC ACID, PLASMA: LACTIC ACID, VENOUS: 1.4 mmol/L (ref 0.5–1.9)

## 2016-02-14 LAB — TYPE AND SCREEN
ABO/RH(D): B POS
ANTIBODY SCREEN: NEGATIVE

## 2016-02-14 MED ORDER — LISINOPRIL 10 MG PO TABS: 10.0000 mg | ORAL_TABLET | Freq: Every day | ORAL | Status: DC

## 2016-02-14 MED ORDER — OXYCODONE-ACETAMINOPHEN 5-325 MG PO TABS
2.0000 | ORAL_TABLET | Freq: Once | ORAL | Status: AC
  Administered 2016-02-14: 2 via ORAL
  Filled 2016-02-14: qty 2

## 2016-02-14 MED ORDER — ONDANSETRON HCL 4 MG/2ML IJ SOLN: 4.0000 mg | Freq: Four times a day (QID) | INTRAMUSCULAR | Status: DC | PRN

## 2016-02-14 MED ORDER — OXYCODONE-ACETAMINOPHEN 5-325 MG PO TABS
1.0000 | ORAL_TABLET | Freq: Four times a day (QID) | ORAL | Status: DC | PRN
  Administered 2016-02-15 (×2): 2 via ORAL
  Filled 2016-02-14 (×2): qty 2

## 2016-02-14 MED ORDER — METRONIDAZOLE 500 MG PO TABS
500.0000 mg | ORAL_TABLET | ORAL | Status: AC
  Administered 2016-02-14: 500 mg via ORAL
  Filled 2016-02-14: qty 1

## 2016-02-14 MED ORDER — ATORVASTATIN CALCIUM 20 MG PO TABS
40.0000 mg | ORAL_TABLET | Freq: Every day | ORAL | Status: DC
  Administered 2016-02-15 – 2016-02-16 (×2): 40 mg via ORAL
  Filled 2016-02-14 (×2): qty 2

## 2016-02-14 MED ORDER — METRONIDAZOLE IN NACL 5-0.79 MG/ML-% IV SOLN
500.0000 mg | Freq: Three times a day (TID) | INTRAVENOUS | Status: DC
  Administered 2016-02-15 – 2016-02-16 (×5): 500 mg via INTRAVENOUS
  Filled 2016-02-14 (×7): qty 100

## 2016-02-14 MED ORDER — SODIUM CHLORIDE 0.9% FLUSH
3.0000 mL | Freq: Two times a day (BID) | INTRAVENOUS | Status: DC
Start: 2016-02-14 — End: 2016-02-16
  Administered 2016-02-15 – 2016-02-16 (×3): 3 mL via INTRAVENOUS

## 2016-02-14 MED ORDER — PANTOPRAZOLE SODIUM 40 MG PO TBEC
40.0000 mg | DELAYED_RELEASE_TABLET | Freq: Two times a day (BID) | ORAL | Status: DC
  Administered 2016-02-15 – 2016-02-16 (×4): 40 mg via ORAL
  Filled 2016-02-14 (×4): qty 1

## 2016-02-14 MED ORDER — OXYCODONE HCL 5 MG PO TABS
5.0000 mg | ORAL_TABLET | Freq: Four times a day (QID) | ORAL | Status: DC | PRN
  Administered 2016-02-15 (×2): 10 mg via ORAL
  Filled 2016-02-14 (×2): qty 2

## 2016-02-14 MED ORDER — METRONIDAZOLE 500 MG PO TABS: 500.0000 mg | ORAL_TABLET | Freq: Three times a day (TID) | ORAL | 0 refills | Status: DC

## 2016-02-14 MED ORDER — ONDANSETRON HCL 4 MG PO TABS: 4.0000 mg | ORAL_TABLET | Freq: Four times a day (QID) | ORAL | Status: DC | PRN

## 2016-02-14 MED ORDER — SODIUM CHLORIDE 0.9 % IV BOLUS (SEPSIS)
1000.0000 mL | Freq: Once | INTRAVENOUS | Status: AC
Start: 2016-02-14 — End: 2016-02-14
  Administered 2016-02-14: 1000 mL via INTRAVENOUS

## 2016-02-14 MED ORDER — ASPIRIN 81 MG PO CHEW: 81.0000 mg | CHEWABLE_TABLET | Freq: Every day | ORAL | Status: DC

## 2016-02-14 MED ORDER — OXYCODONE-ACETAMINOPHEN 10-325 MG PO TABS: 1.0000 | ORAL_TABLET | Freq: Four times a day (QID) | ORAL | Status: DC | PRN

## 2016-02-14 MED ORDER — ACETAMINOPHEN 325 MG PO TABS: 650.0000 mg | ORAL_TABLET | Freq: Four times a day (QID) | ORAL | Status: DC | PRN

## 2016-02-14 MED ORDER — ACETAMINOPHEN 650 MG RE SUPP: 650.0000 mg | Freq: Four times a day (QID) | RECTAL | Status: DC | PRN

## 2016-02-14 MED ORDER — SODIUM CHLORIDE 0.9 % IV SOLN
INTRAVENOUS | Status: AC
Start: 2016-02-14 — End: 2016-02-15
  Administered 2016-02-15: 01:00:00 via INTRAVENOUS

## 2016-02-14 NOTE — ED Triage Notes (Signed)
Pt reports bright red rectal bleeding last night. Only other sx is lower abdominal cramping. Had same problem last year r/t colitis. No blood transfusion needed last year for this.

## 2016-02-14 NOTE — H&P (Signed)
[facility] [place] NAME: Brian [name]    MR#:  [id number]  DATE OF BIRTH:  30-Aug-1940  DATE OF ADMISSION:  02/14/2016  PRIMARY CARE PHYSICIAN: Mathews Argyle, MD   REQUESTING/REFERRING PHYSICIAN: Jacqualine Code, MD  CHIEF COMPLAINT:   Chief Complaint  Patient presents with  . Rectal Bleeding    HISTORY OF PRESENT ILLNESS:  Brian [name]  is a 75 y.o. male who presents with Diarrhea and bright red blood per rectum. Patient states that early this morning he started having frequent stools, very liquidy, with bright red blood. He states he has significant crampy abdominal pain. He had a very similar episode a year ago and was diagnosed with C. difficile colitis at that time. He came to the ED for evaluation and was doing somewhat better here with reduction in his abdominal symptoms and no further stools until he was about to be discharged when he had another episode of bright red blood. His lab work is all stable, so hospitals were called for admission and further workup.  PAST MEDICAL HISTORY:   Past Medical History:  Diagnosis Date  . Adenomatous polyp   . Carpal tunnel syndrome on left   . Cervical spine disease   . Coronary artery disease   . Hyperlipidemia   . Hypertension   . Low back pain   . Myocardial infarction ([place]) 1998  . Sleep apnea     PAST SURGICAL HISTORY:   Past Surgical History:  Procedure Laterality Date  . APPENDECTOMY    . bunions    . CATARACT EXTRACTION    . COLONOSCOPY WITH PROPOFOL N/A 02/19/2015   Procedure: COLONOSCOPY WITH PROPOFOL;  Surgeon: Lucilla Lame, MD;  Location: [facility] ENDOSCOPY;  Service: Endoscopy;  Laterality: N/A;  . HEMORRHOID BANDING    . HERNIA REPAIR    . lumbar disc protrussion  03/2010    SOCIAL HISTORY:   Social History  Substance Use Topics  . Smoking status: Former Smoker    Packs/day: 1.00    Years: 25.00    Types: Cigarettes  . Smokeless tobacco: Never Used   Comment: 1/2 3/4 ppd  . Alcohol use 0.0 oz/week     Comment: occasionally    FAMILY HISTORY:   Family History  Problem Relation Age of Onset  . Kidney disease Father   . Hypertension Father   . Heart failure Mother   . Coronary artery disease Brother     DRUG ALLERGIES:  No Known Allergies  MEDICATIONS AT HOME:   Prior to Admission medications   Medication Sig Start Date End Date Taking? Authorizing Provider  aspirin 81 MG tablet Take 81 mg by mouth daily.     Yes Historical Provider, MD  atorvastatin (LIPITOR) 40 MG tablet Take 40 mg by mouth daily.     Yes Historical Provider, MD  Coenzyme Q10 (COQ10) 100 MG CAPS Take 1 capsule by mouth daily.     Yes Historical Provider, MD  fluticasone (FLONASE) 50 MCG/ACT nasal spray Place 2 sprays into both nostrils at bedtime. 10/30/15  Yes Historical Provider, MD  multivitamin [facility]) per tablet Take 1 tablet by mouth daily.     Yes Historical Provider, MD  oxyCODONE-acetaminophen (PERCOCET) 10-325 MG tablet Take one to two tablets by mouth every 4 to 6 hours as needed for pain.   Yes Historical Provider, MD  quinapril (ACCUPRIL) 10 MG tablet Take 10 mg by mouth daily.   Yes Historical Provider, MD  metroNIDAZOLE (  FLAGYL) 500 MG tablet Take 1 tablet (500 mg total) by mouth 3 (three) times daily. 02/14/16   Delman Kitten, MD    REVIEW OF SYSTEMS:  Review of Systems  Constitutional: Negative for chills, fever, malaise/fatigue and weight loss.  HENT: Negative for ear pain, hearing loss and tinnitus.   Eyes: Negative for blurred vision, double vision, pain and redness.  Respiratory: Negative for cough, hemoptysis and shortness of breath.   Cardiovascular: Negative for chest pain, palpitations, orthopnea and leg swelling.  Gastrointestinal: Positive for abdominal pain, blood in stool and diarrhea. Negative for constipation, nausea and vomiting.  Genitourinary: Negative for dysuria, frequency and hematuria.  Musculoskeletal: Negative for  back pain, joint pain and neck pain.  Skin:       No acne, rash, or lesions  Neurological: Negative for dizziness, tremors, focal weakness and weakness.  Endo/Heme/Allergies: Negative for polydipsia. Does not bruise/bleed easily.  Psychiatric/Behavioral: Negative for depression. The patient is not nervous/anxious and does not have insomnia.      VITAL SIGNS:   Vitals:   02/14/16 1334 02/14/16 1750 02/14/16 1900  BP: 130/86 131/88 139/86  Pulse: 89 69 76  Resp: 16 16 17   Temp: 98.2 F (36.8 C)    TempSrc: Oral    SpO2: 95% 97% 98%  Weight: 72.1 kg (159 lb)    Height: 5\' 10"  (1.778 m)     Wt Readings from Last 3 Encounters:  02/14/16 72.1 kg (159 lb)  11/25/15 73.6 kg (162 lb 3.2 oz)  03/18/15 72.6 kg (160 lb)    PHYSICAL EXAMINATION:  Physical Exam  Vitals reviewed. Constitutional: He is oriented to person, place, and time. He appears well-developed and well-nourished. No distress.  HENT:  Head: Normocephalic and atraumatic.  Mouth/Throat: Oropharynx is clear and moist.  Eyes: Conjunctivae and EOM are normal. Pupils are equal, round, and reactive to light. No scleral icterus.  Neck: Normal range of motion. Neck supple. No JVD present. No thyromegaly present.  Cardiovascular: Normal rate, regular rhythm and intact distal pulses.  Exam reveals no gallop and no friction rub.   No murmur heard. Respiratory: Effort normal and breath sounds normal. No respiratory distress. He has no wheezes. He has no rales.  GI: Soft. Bowel sounds are normal. He exhibits no distension. There is tenderness.  Musculoskeletal: Normal range of motion. He exhibits no edema.  No arthritis, no gout  Lymphadenopathy:    He has no cervical adenopathy.  Neurological: He is alert and oriented to person, place, and time. No cranial nerve deficit.  No dysarthria, no aphasia  Skin: Skin is warm and dry. No rash noted. No erythema.  Psychiatric: He has a normal mood and affect. His behavior is normal.  Judgment and thought content normal.    LABORATORY PANEL:   CBC  Recent Labs Lab 02/14/16 1336 02/14/16 1753  WBC 15.3*  --   HGB 15.7 14.7  HCT 47.7 44.2  PLT 230  --    ------------------------------------------------------------------------------------------------------------------  Chemistries   Recent Labs Lab 02/14/16 1336  NA 138  K 4.2  CL 107  CO2 21*  GLUCOSE 111*  BUN 18  CREATININE 0.95  CALCIUM 9.5  AST 26  ALT 18  ALKPHOS 91  BILITOT 1.1   ------------------------------------------------------------------------------------------------------------------  Cardiac Enzymes No results for input(s): TROPONINI in the last 168 hours. ------------------------------------------------------------------------------------------------------------------  RADIOLOGY:  No results found.  EKG:   Orders placed or performed in visit on 11/25/15  . EKG 12-Lead    IMPRESSION AND PLAN:  Principal Problem:   BRBPR (bright red blood per rectum) - this is likely due to a colitis picture. Unclear if it would be recurrent C. difficile or not. Studies are pending. Patient's CT scan from his episode last year showed some signs of ischemic colitis. CT scan was not ordered today, lactic acid is pending. Patient's workup initially is otherwise stable. He was started on Flagyl, we will continue this on admission and wait for C. difficile and stool studies to result. We will also have him on twice a day PPI prophylactically for now. If the patient does end up having more bloody bowel movements he will likely need to be transferred to a facility that has GI coverage tomorrow. Active Problems:   Coronary atherosclerosis - continue home meds   HTN (hypertension) - early stable, continue home meds   Hyperlipidemia - continue home dose statin  All the records are reviewed and case discussed with ED provider. Management plans discussed with the patient and/or family.  DVT  PROPHYLAXIS: Mechanical only  GI PROPHYLAXIS: PPI  ADMISSION STATUS: Observation  CODE STATUS: Full Code Status History    Date Active Date Inactive Code Status Order ID Comments User Context   02/17/2015 11:58 PM 02/21/2015  4:23 PM Full Code [id number]  Lytle Butte, MD ED      TOTAL TIME TAKING CARE OF THIS PATIENT: 40 minutes.    Vencil Basnett [place] 02/14/2016, 9:00 PM  [employer] [facility]  Office  [phone]  CC: Primary care physician; Mathews Argyle, MD

## 2016-02-14 NOTE — ED Provider Notes (Addendum)
[facility] Emergency Department Provider Note   ____________________________________________   First MD Initiated Contact with Patient 02/14/16 1654     (approximate)  I have reviewed the triage vital signs and the nursing notes.   HISTORY  Chief Complaint Rectal Bleeding    HPI Brian [name] is a 75 y.o. male the previous history of coronary disease, C. difficile, ischemic colitis.  Patient reports thatsymptoms went away 2-3 hours ago, but until then having frequent loose stools from 1 AM until 6AM.   Patient reports he seems to be having the same, though not as severe of symptoms, as occurred in August of last year. He is diagnosed with colitis and C. difficile at that time. He denies that he is having frequent foul-smelling stool, and he has not been in the hospital or taking antibiotics recently for which he can recall.  Does report these having crampy pain in the left lower abdomen throughout the evening along with several loose bowel movements that had small amounts about "6 drops" of blood in them at a time. He reports he says doctor who recommended he come to the hospital for further evaluation.  For about the last 3 hours, the patient reports his pain has essentially gone away, no more cramps, and he does not feel like he can have another bowel movement. He overall reports feeling better in the last few hours.  He takes aspirin, baby but no other blood thinner.  Denies large bloody stools. No nausea vomiting. No upper right-sided abdominal pain. History of a previous appendectomy.  No fevers or chills. No chest pain or trouble breathing.   Past Medical History:  Diagnosis Date  . Adenomatous polyp   . Carpal tunnel syndrome on left   . Cervical spine disease   . Coronary artery disease   . Hyperlipidemia   . Hypertension   . Low back pain   . Myocardial infarction ([place]) 1998  . Sleep apnea     Patient Active Problem List   Diagnosis Date Noted  . Aortic calcification ([place]) 05/08/2015  . Enteritis due to Clostridium difficile 02/21/2015  . Ischemic bowel disease ([place])   . Hyperlipidemia 02/05/2010  . TOBACCO USER 02/05/2010  . Old myocardial infarction 02/05/2010  . Coronary atherosclerosis 02/05/2010  . HYPERSOMNIA WITH SLEEP APNEA UNSPECIFIED 02/05/2010    Past Surgical History:  Procedure Laterality Date  . APPENDECTOMY    . bunions    . CATARACT EXTRACTION    . COLONOSCOPY WITH PROPOFOL N/A 02/19/2015   Procedure: COLONOSCOPY WITH PROPOFOL;  Surgeon: Lucilla Lame, MD;  Location: [facility] ENDOSCOPY;  Service: Endoscopy;  Laterality: N/A;  . HEMORRHOID BANDING    . HERNIA REPAIR    . lumbar disc protrussion  03/2010    Prior to Admission medications   Medication Sig Start Date End Date Taking? Authorizing Provider  aspirin 81 MG tablet Take 81 mg by mouth daily.      Historical Provider, MD  atorvastatin (LIPITOR) 40 MG tablet Take 40 mg by mouth daily.      Historical Provider, MD  Coenzyme Q10 (COQ10) 100 MG CAPS Take 1 capsule by mouth daily.      Historical Provider, MD  fluticasone (FLONASE) 50 MCG/ACT nasal spray Place 2 sprays into both nostrils at bedtime. 10/30/15   Historical Provider, MD  metroNIDAZOLE (FLAGYL) 500 MG tablet Take 1 tablet (500 mg total) by mouth 3 (three) times daily. 02/14/16   Delman Kitten, MD  multivitamin [facility]) per  tablet Take 1 tablet by mouth daily.      Historical Provider, MD  oxyCODONE-acetaminophen (PERCOCET) 10-325 MG tablet Take one to two tablets by mouth every 4 to 6 hours as needed for pain.    Historical Provider, MD  quinapril (ACCUPRIL) 10 MG tablet Take 10 mg by mouth daily.    Historical Provider, MD    Allergies Review of patient's allergies indicates no known allergies.  Family History  Problem Relation Age of Onset  . Kidney disease Father   . Hypertension Father   . Heart failure Mother   . Coronary artery disease Brother     Social  History Social History  Substance Use Topics  . Smoking status: Former Smoker    Packs/day: 1.00    Years: 25.00    Types: Cigarettes  . Smokeless tobacco: Never Used     Comment: 1/2 3/4 ppd  . Alcohol use 0.0 oz/week     Comment: occasionally    Review of Systems Constitutional: No fever/chills Eyes: No visual changes. ENT: No sore throat. Cardiovascular: Denies chest pain. Respiratory: Denies shortness of breath. Gastrointestinal:No nausea, no vomiting.  No diarrhea. Genitourinary: Negative for dysuria. Musculoskeletal: Negative for back pain. Skin: Negative for rash. Neurological: Negative for headaches, focal weakness or numbness.  10-point ROS otherwise negative.  ____________________________________________   PHYSICAL EXAM:  VITAL SIGNS: ED Triage Vitals [02/14/16 1334]  Enc Vitals Group     BP 130/86     Pulse Rate 89     Resp 16     Temp 98.2 F (36.8 C)     Temp Source Oral     SpO2 95 %     Weight 159 lb (72.1 kg)     Height 5\' 10"  (1.778 m)     Head Circumference      Peak Flow      Pain Score      Pain Loc      Pain Edu?      Excl. in [place]?     Constitutional: Alert and oriented. Well appearing and in no acute distress. Eyes: Conjunctivae are normal. PERRL. EOMI. Head: Atraumatic. Nose: No congestion/rhinnorhea. Mouth/Throat: Mucous membranes are moist.  Oropharynx non-erythematous. Neck: No stridor.   Cardiovascular: Normal rate, regular rhythm. Grossly normal heart sounds.  Good peripheral circulation. Respiratory: Normal respiratory effort.  No retractions. Lungs CTAB. Gastrointestinal: Soft and nontender. No distention. No abdominal bruits. No CVA tenderness.No rebound or guarding. Presently not experiencing any abdominal pain. Rectal exam shows formed stool, slight positive guaiac. No evidence of large volume rectal bleeding at this time. Musculoskeletal: No lower extremity tenderness nor edema.  No joint effusions. Neurologic:  Normal  speech and language. No gross focal neurologic deficits are appreciated. No gait instability. Skin:  Skin is warm, dry and intact. No rash noted. Psychiatric: Mood and affect are normal. Speech and behavior are normal.  ____________________________________________   LABS (all labs ordered are listed, but only abnormal results are displayed)  Labs Reviewed  COMPREHENSIVE METABOLIC PANEL - Abnormal; Notable for the following:       Result Value   CO2 21 (*)    Glucose, Bld 111 (*)    All other components within normal limits  CBC - Abnormal; Notable for the following:    WBC 15.3 (*)    All other components within normal limits  GASTROINTESTINAL PANEL BY PCR, STOOL (REPLACES STOOL CULTURE)  C DIFFICILE QUICK SCREEN W PCR REFLEX  HEMOGLOBIN AND HEMATOCRIT, BLOOD  POC OCCULT  BLOOD, ED  TYPE AND SCREEN   ____________________________________________  EKG   ____________________________________________  RADIOLOGY  Presently do not believe CT imaging is warranted. Previous CT demonstrated questionable ischemic changes, in August. Presently the patient is not having any any abdominal pain. No rebound guarding or peritonitis. ____________________________________________   PROCEDURES  Procedure(s) performed: None  Procedures  Critical Care performed: No  ____________________________________________   INITIAL IMPRESSION / ASSESSMENT AND PLAN / ED COURSE  Pertinent labs & imaging results that were available during my care of the patient were reviewed by me and considered in my medical decision making (see chart for details).  Discussed case with Dr. Allen Norris, gastroenterology. He advises that given the patient's clinical stability, that his hemoglobin is stable, and that he reports his symptoms have much improved he would be reasonable to hydrate him with a liter of fluid, and send him home with a prescription for Flagyl with close follow-up and return precautions. The patient  does not presently seem to be in need of emergent hospitalization, and he advises colonoscopy would not be performed tonight unless on an emergent basis.  I will repeat a hemoglobin, and place the patient on Flagyl. I discussed observation for ongoing bleeding, and the benefits of observing him in the hospital including checking to make sure is no reducing blood counts, excluding yesterday and diffusely sealed by testing, and also observing for changes in symptoms overnight versus going home. The patient feels comfortable going home, and so does his wife and daughter as his symptoms are much better. We will place on Flagyl in the event he does have recurrent C. difficile, though he shows no evidence of being toxic, and his bowel movements seem to have stopped.    Clinical Course   Patient remains stable. Able to eat a meal in the emergency room. No recurrence of pain or ongoing diarrhea. Return precautions and treatment recommendations and follow-up discussed with the patient who is agreeable with the plan.   ____________________________________________   FINAL CLINICAL IMPRESSION(S) / ED DIAGNOSES  Final diagnoses:  Ischemic colitis ([place])  Rectal bleeding      NEW MEDICATIONS STARTED DURING THIS VISIT:  New Prescriptions   METRONIDAZOLE (FLAGYL) 500 MG TABLET    Take 1 tablet (500 mg total) by mouth 3 (three) times daily.     Note:  This document was prepared using Dragon voice recognition software and may include unintentional dictation errors.     Delman Kitten, MD 02/14/16 1853  Just at the time of discharge, the patient had recurrent crampiness and another bowel movement. He had a grossly bloody, approximately 50 mL bloody movement. Reports cramps have now gone away, after discussing with the patient and he is amenable to staying for ongoing observation due to the persistence of GI bleeding.   Delman Kitten, MD 02/14/16 [phone]

## 2016-02-14 NOTE — ED Notes (Signed)
MD at bedside discussing discharge instructions and answering all questions for patient and their family.

## 2016-02-14 NOTE — ED Notes (Signed)
Admitting MD at bedside.

## 2016-02-15 ENCOUNTER — Observation Stay: Payer: Medicare Other

## 2016-02-15 ENCOUNTER — Encounter: Payer: Self-pay | Admitting: Radiology

## 2016-02-15 LAB — BASIC METABOLIC PANEL
ANION GAP: 7 (ref 5–15)
BUN: 14 mg/dL (ref 6–20)
CHLORIDE: 108 mmol/L (ref 101–111)
CO2: 24 mmol/L (ref 22–32)
Calcium: 8.2 mg/dL — ABNORMAL LOW (ref 8.9–10.3)
Creatinine, Ser: 0.79 mg/dL (ref 0.61–1.24)
GFR calc Af Amer: >60 mL/min (ref >60)
GFR calc non Af Amer: >60 mL/min (ref >60)
GLUCOSE: 93 mg/dL (ref 65–99)
POTASSIUM: 4.1 mmol/L (ref 3.5–5.1)
Sodium: 139 mmol/L (ref 135–145)

## 2016-02-15 LAB — CBC
HEMATOCRIT: 37.8 % — AB (ref 40.0–52.0)
HEMOGLOBIN: 12.9 g/dL — AB (ref 13.0–18.0)
MCH: 30 pg (ref 26.0–34.0)
MCHC: 34.2 g/dL (ref 32.0–36.0)
MCV: 87.7 fL (ref 80.0–100.0)
Platelets: 178 10*3/uL (ref 150–440)
RBC: 4.31 MIL/uL — ABNORMAL LOW (ref 4.40–5.90)
RDW: 13.5 % (ref 11.5–14.5)
WBC: 10.6 10*3/uL (ref 3.8–10.6)

## 2016-02-15 MED ORDER — IOPAMIDOL (ISOVUE-300) INJECTION 61%
100.0000 mL | Freq: Once | INTRAVENOUS | Status: AC | PRN
  Administered 2016-02-15: 100 mL via INTRAVENOUS

## 2016-02-15 MED ORDER — OXYCODONE HCL 5 MG PO TABS: 10.0000 mg | ORAL_TABLET | Freq: Four times a day (QID) | ORAL | Status: DC | PRN

## 2016-02-15 MED ORDER — OXYCODONE HCL 5 MG PO TABS
20.0000 mg | ORAL_TABLET | Freq: Four times a day (QID) | ORAL | Status: DC | PRN
  Administered 2016-02-15 – 2016-02-16 (×3): 20 mg via ORAL
  Filled 2016-02-15 (×3): qty 4

## 2016-02-15 MED ORDER — DIATRIZOATE MEGLUMINE & SODIUM 66-10 % PO SOLN
15.0000 mL | ORAL | Status: AC
Start: 2016-02-15 — End: 2016-02-15
  Administered 2016-02-15 (×2): 15 mL via ORAL

## 2016-02-15 NOTE — Care Management Obs Status (Signed)
[place] NOTIFICATION   Patient Details  Name: Brian [name] MRN: [id number] Date of Birth: 18-Oct-1940   Medicare Observation Status Notification Given:  Yes    Carles Collet, RN 02/15/2016, 10:54 AM

## 2016-02-15 NOTE — Progress Notes (Signed)
[facility] [place] NAME: Brian [name]    MR#:  [id number]  DATE OF BIRTH:  [birth year]  SUBJECTIVE:  CHIEF COMPLAINT:   Chief Complaint  Patient presents with  . Rectal Bleeding   Continues to have rectal bleeding. No stool in the last bowel movement. Lower abdominal cramping improved. Some nausea but no vomiting. History of C. difficile 1 year back. No recent antibiotic use.  REVIEW OF SYSTEMS:    Review of Systems  Constitutional: Positive for malaise/fatigue. Negative for chills and fever.  HENT: Negative for sore throat.   Eyes: Negative for blurred vision, double vision and pain.  Respiratory: Negative for cough, hemoptysis, shortness of breath and wheezing.   Cardiovascular: Negative for chest pain, palpitations, orthopnea and leg swelling.  Gastrointestinal: Positive for abdominal pain, blood in stool and diarrhea. Negative for constipation, heartburn, nausea and vomiting.  Genitourinary: Negative for dysuria and hematuria.  Musculoskeletal: Negative for back pain and joint pain.  Skin: Negative for rash.  Neurological: Negative for sensory change, speech change, focal weakness and headaches.  Endo/Heme/Allergies: Does not bruise/bleed easily.  Psychiatric/Behavioral: Negative for depression. The patient is not nervous/anxious.     DRUG ALLERGIES:  No Known Allergies  VITALS:  Blood pressure 100/64, pulse 61, temperature 98.1 F (36.7 C), temperature source Oral, resp. rate 16, height 5\' 10"  (1.778 m), weight 72.1 kg (159 lb), SpO2 96 %.  PHYSICAL EXAMINATION:   Physical Exam  GENERAL:  75 y.o.-year-old patient lying in the bed with no acute distress.  EYES: Pupils equal, round, reactive to light and accommodation. No scleral icterus. Extraocular muscles intact.  HEENT: Head atraumatic, normocephalic. Oropharynx and nasopharynx clear.  NECK:  Supple, no jugular venous distention. No thyroid enlargement, no  tenderness.  LUNGS: Normal breath sounds bilaterally, no wheezing, rales, rhonchi. No use of accessory muscles of respiration.  CARDIOVASCULAR: S1, S2 normal. No murmurs, rubs, or gallops.  ABDOMEN: Soft, nontender, nondistended. Bowel sounds present. No organomegaly or mass.  EXTREMITIES: No cyanosis, clubbing or edema b/l.    NEUROLOGIC: Cranial nerves II through XII are intact. No focal Motor or sensory deficits b/l.   PSYCHIATRIC: The patient is alert and oriented x 3.  SKIN: No obvious rash, lesion, or ulcer.   LABORATORY PANEL:   CBC  Recent Labs Lab 02/15/16 0547  WBC 10.6  HGB 12.9*  HCT 37.8*  PLT 178   ------------------------------------------------------------------------------------------------------------------ Chemistries   Recent Labs Lab 02/14/16 1336 02/15/16 0547  NA 138 139  K 4.2 4.1  CL 107 108  CO2 21* 24  GLUCOSE 111* 93  BUN 18 14  CREATININE 0.95 0.79  CALCIUM 9.5 8.2*  AST 26  --   ALT 18  --   ALKPHOS 91  --   BILITOT 1.1  --    ------------------------------------------------------------------------------------------------------------------  Cardiac Enzymes No results for input(s): TROPONINI in the last 168 hours. ------------------------------------------------------------------------------------------------------------------  RADIOLOGY:  No results found.   ASSESSMENT AND PLAN:     * BRBPR (bright red blood per rectum)  Etiology unclear. Differential includes diverticulosis versus polyps versus colitis. He does have mild cramping in the lower abdominal area. Will check CT scan with contrast. C. difficile is pending. Continues to have bleeding. There is also drop in hemoglobin. No need for transfusion. Repeat labs. Transfuse if less than 8 or gets symptomatic. Consult GI if no improvement.    * Coronary atherosclerosis - continue home meds Hold aspirin.    * HTN (  hypertension) - early stable, continue home meds Hold  lisinopril as blood pressure is low normal.    * Hyperlipidemia - continue home dose statin  * DVT prophylaxis with SCDs.   All the records are reviewed and case discussed with Care Management/Social Workerr. Management plans discussed with the patient, family and they are in agreement.  CODE STATUS: FULL  DVT Prophylaxis: SCDs  TOTAL TIME TAKING CARE OF THIS PATIENT: 35 minutes.   POSSIBLE D/C IN 1-2 DAYS, DEPENDING ON CLINICAL CONDITION.  Hillary Bow R M.D on 02/15/2016 at 9:51 AM  Between 7am to 6pm - Pager - [phone]  After 6pm go to [email or web address] - password [email or web address]  Office  [phone]  CC: Primary care physician; Mathews Argyle, MD  Note: This dictation was prepared with Dragon dictation along with smaller phrase technology. Any transcriptional errors that result from this process are unintentional.

## 2016-02-15 NOTE — Care Management Note (Signed)
Case Management Note  Patient Details  Name: OLOF FADELY MRN: [id number] Date of Birth: Nov 09, 1940  Subjective/Objective:                 Independent patient from home. In obs BRBPR. PCP Hal Stoneking. Denies needs for medication assistance and DME. No CM needs identified.    Action/Plan:  No CM needs identified. Will DC to home when medically clear.  Expected Discharge Date:                  Expected Discharge Plan:  Home/Self Care  In-House Referral:     Discharge planning Services  CM Consult  Post Acute Care Choice:  NA Choice offered to:  NA  DME Arranged:  N/A DME Agency:  NA  HH Arranged:  NA HH Agency:  NA  Status of Service:  Completed, signed off  If discussed at [place] of Stay Meetings, dates discussed:    Additional Comments:  Carles Collet, RN 02/15/2016, 10:54 AM

## 2016-02-15 NOTE — Progress Notes (Addendum)
Per MD, hold ASA and Lisinopril.

## 2016-02-16 LAB — CBC WITH DIFFERENTIAL/PLATELET
BASOS ABS: 0.1 10*3/uL (ref 0–0.1)
BASOS PCT: 1 %
EOS ABS: 0.4 10*3/uL (ref 0–0.7)
EOS PCT: 5 %
HCT: 37.8 % — ABNORMAL LOW (ref 40.0–52.0)
Hemoglobin: 13.3 g/dL (ref 13.0–18.0)
LYMPHS PCT: 31 %
Lymphs Abs: 2.6 10*3/uL (ref 1.0–3.6)
MCH: 30.6 pg (ref 26.0–34.0)
MCHC: 35.3 g/dL (ref 32.0–36.0)
MCV: 86.6 fL (ref 80.0–100.0)
MONO ABS: 1.1 10*3/uL — AB (ref 0.2–1.0)
Monocytes Relative: 13 %
Neutro Abs: 4.3 10*3/uL (ref 1.4–6.5)
Neutrophils Relative %: 50 %
PLATELETS: 171 10*3/uL (ref 150–440)
RBC: 4.37 MIL/uL — AB (ref 4.40–5.90)
RDW: 13 % (ref 11.5–14.5)
WBC: 8.5 10*3/uL (ref 3.8–10.6)

## 2016-02-16 MED ORDER — METRONIDAZOLE 500 MG PO TABS: 500.0000 mg | ORAL_TABLET | Freq: Three times a day (TID) | ORAL | 0 refills | Status: DC

## 2016-02-16 MED ORDER — CIPROFLOXACIN HCL 500 MG PO TABS: 500.0000 mg | ORAL_TABLET | Freq: Two times a day (BID) | ORAL | 0 refills | Status: DC

## 2016-02-16 NOTE — Discharge Instructions (Signed)
° °  Call gastroenterology to set up follow-up in addition to follow-up with primary care.  Soft diet for next 2-3 days and then advance as tolerated.  Resume regular activity.  Do not take aspirin for next 1 week.

## 2016-02-17 NOTE — Discharge Summary (Signed)
Brian [name] at [place] NAME: Brian [name]    MR#:  [id number]  DATE OF BIRTH:  Jul 20, 1941  DATE OF ADMISSION:  02/14/2016 ADMITTING PHYSICIAN: Lance Coon, MD  DATE OF DISCHARGE: 02/16/2016  3:23 PM  PRIMARY CARE PHYSICIAN: Mathews Argyle, MD   ADMISSION DIAGNOSIS:  Rectal bleeding [K62.5] Ischemic colitis ([place]) [K55.9]  DISCHARGE DIAGNOSIS:  Principal Problem:   BRBPR (bright red blood per rectum) Active Problems:   Hyperlipidemia   Coronary atherosclerosis   HTN (hypertension)   SECONDARY DIAGNOSIS:   Past Medical History:  Diagnosis Date  . Adenomatous polyp   . Carpal tunnel syndrome on left   . Cervical spine disease   . Coronary artery disease   . Hyperlipidemia   . Hypertension   . Low back pain   . Myocardial infarction ([place]) 1998  . Sleep apnea      ADMITTING HISTORY  Brian [name]  is a 75 y.o. male who presents with Diarrhea and bright red blood per rectum. Patient states that early this morning he started having frequent stools, very liquidy, with bright red blood. He states he has significant crampy abdominal pain. He had a very similar episode a year ago and was diagnosed with C. difficile colitis at that time. He came to the ED for evaluation and was doing somewhat better here with reduction in his abdominal symptoms and no further stools until he was about to be discharged when he had another episode of bright red blood. His lab work is all stable, so hospitals were called for admission and further workup.  HOSPITAL COURSE:   * BRBPR (bright red blood per rectum) due to sigmoid colitis He does have mild cramping in the lower abdominal area. CT scan of abdomen and pelvis showed sigmoid and descending colitis. This was treated as likely being infectious with antibiotics and patient is much improved. No further bleeding noticed. No diarrhea in the hospital. Patient will be discharged home on as  needed pain medications, oral antibiotics for 1 more week.  * Coronary atherosclerosis - continue home meds Hold aspirin for 1 week after discharge  * HTN (hypertension) - stable, continue home meds  * Hyperlipidemia - continue home dose statin  * DVT prophylaxis with SCDs.  Stable for discharge home.  CONSULTS OBTAINED:    DRUG ALLERGIES:  No Known Allergies  DISCHARGE MEDICATIONS:   Discharge Medication List as of 02/16/2016  1:04 PM    START taking these medications   Details  ciprofloxacin (CIPRO) 500 MG tablet Take 1 tablet (500 mg total) by mouth 2 (two) times daily., Starting Sun 02/16/2016, Normal    !! metroNIDAZOLE (FLAGYL) 500 MG tablet Take 1 tablet (500 mg total) by mouth 3 (three) times daily., Starting Fri 02/14/2016, Print    !! metroNIDAZOLE (FLAGYL) 500 MG tablet Take 1 tablet (500 mg total) by mouth 3 (three) times daily., Starting Sun 02/16/2016, Normal     !! - Potential duplicate medications found. Please discuss with provider.    CONTINUE these medications which have NOT CHANGED   Details  aspirin 81 MG tablet Take 81 mg by mouth daily.  , Historical Med    atorvastatin (LIPITOR) 40 MG tablet Take 40 mg by mouth daily.  , Historical Med    Coenzyme Q10 (COQ10) 100 MG CAPS Take 1 capsule by mouth daily.  , Historical Med    fluticasone (FLONASE) 50 MCG/ACT nasal spray Place 2 sprays into both nostrils at  bedtime., Starting Wed 10/30/2015, Historical Med    multivitamin (THERAGRAN) per tablet Take 1 tablet by mouth daily.  , Historical Med    oxyCODONE-acetaminophen (PERCOCET) 10-325 MG tablet Take one to two tablets by mouth every 4 to 6 hours as needed for pain., Historical Med    quinapril (ACCUPRIL) 10 MG tablet Take 10 mg by mouth daily., Historical Med        Today   VITAL SIGNS:  Blood pressure 132/69, pulse (!) 58, temperature 97.8 F (36.6 C), temperature source Oral, resp. rate 18, height 5\' 10"  (1.778 m), weight 72.1 kg (159  lb), SpO2 97 %.  I/O:  No intake or output data in the 24 hours ending 02/17/16 1304  PHYSICAL EXAMINATION:  Physical Exam  GENERAL:  75 y.o.-year-old patient lying in the bed with no acute distress.  LUNGS: Normal breath sounds bilaterally, no wheezing, rales,rhonchi or crepitation. No use of accessory muscles of respiration.  CARDIOVASCULAR: S1, S2 normal. No murmurs, rubs, or gallops.  ABDOMEN: Soft, non-tender, non-distended. Bowel sounds present. No organomegaly or mass.  NEUROLOGIC: Moves all 4 extremities. PSYCHIATRIC: The patient is alert and oriented x 3.  SKIN: No obvious rash, lesion, or ulcer.   DATA REVIEW:   CBC  Recent Labs Lab 02/16/16 0616  WBC 8.5  HGB 13.3  HCT 37.8*  PLT 171    Chemistries   Recent Labs Lab 02/14/16 1336 02/15/16 0547  NA 138 139  K 4.2 4.1  CL 107 108  CO2 21* 24  GLUCOSE 111* 93  BUN 18 14  CREATININE 0.95 0.79  CALCIUM 9.5 8.2*  AST 26  --   ALT 18  --   ALKPHOS 91  --   BILITOT 1.1  --     Cardiac Enzymes No results for input(s): TROPONINI in the last 168 hours.  Microbiology Results  Results for orders placed or performed during the hospital encounter of 02/17/15  C difficile quick scan w PCR reflex ([place] only)     Status: Abnormal   Collection Time: 02/18/15  5:28 PM  Result Value Ref Range Status   C Diff antigen POSITIVE (A) NEGATIVE Final   C Diff toxin NEGATIVE NEGATIVE Final   C Diff interpretation   Final    Positive for toxigenic C. difficile, active toxin production present.    Comment: READ BACK AND VERIFIED BY STACEY CLAY @2245  02/18/15.Marland KitchenMarland KitchenAJO  Clostridium Difficile by PCR     Status: Abnormal   Collection Time: 02/18/15  5:28 PM  Result Value Ref Range Status   Toxigenic C Difficile by pcr POSITIVE (A) NEGATIVE Final    Comment: CRITICAL RESULT CALLED TO, READ BACK BY AND VERIFIED WITH: STACEY CLAY ON 02/18/15 AT 2245 BY AJO     RADIOLOGY:  Ct Abdomen Pelvis W Contrast  Result Date:  02/15/2016 CLINICAL DATA:  75 year old male with abdominal pain and blood per rectum for 3 days. EXAM: CT ABDOMEN AND PELVIS WITH CONTRAST TECHNIQUE: Multidetector CT imaging of the abdomen and pelvis was performed using the standard protocol following bolus administration of intravenous contrast. CONTRAST:  165mL ISOVUE-300 IOPAMIDOL (ISOVUE-300) INJECTION 61% COMPARISON:  02/17/2015 CT. FINDINGS: Lower chest:  Mild bibasilar scarring again noted. Hepatobiliary: The liver and gallbladder are unremarkable. There is no evidence of biliary dilatation. Pancreas: Unremarkable and unchanged. Spleen: Unremarkable Adrenals/Urinary Tract: Mild bilateral renal cortical thinning identified. The kidneys are otherwise unremarkable. The adrenal glands and bladder are unremarkable. Stomach/Bowel: Diffuse circumferential wall thickening of the sigmoid and distal  descending colon is compatible with colitis. There is no evidence of bowel obstruction, pneumoperitoneum or abscess. The visualized mesenteric arteries and veins are patent. Vascular/Lymphatic: Abdominal aortic atherosclerotic calcifications noted without aneurysm. No enlarged lymph nodes identified. Reproductive: Prostate enlargement noted. Other: A trace amount of free pelvic fluid noted. Musculoskeletal: No acute or suspicious abnormality. Severe degenerative disc disease at L3-4 again noted. IMPRESSION: Sigmoid and distal descending colitis, likely infectious or inflammatory. No evidence of bowel obstruction, pneumoperitoneum or abscess. Trace free pelvic fluid. Abdominal aortic atherosclerosis. Electronically Signed   By: Margarette Canada M.D.   On: 02/15/2016 15:29   Follow up with PCP in 1 week.  Management plans discussed with the patient, family and they are in agreement.  CODE STATUS:  Code Status History    Date Active Date Inactive Code Status Order ID Comments User Context   02/14/2016 11:12 PM 02/16/2016  6:28 PM Full Code [id number]  Lance Coon, MD  Inpatient   02/17/2015 11:58 PM 02/21/2015  4:23 PM Full Code [id number]  Lytle Butte, MD ED      TOTAL TIME TAKING CARE OF THIS PATIENT ON DAY OF DISCHARGE: more than 30 minutes.   Hillary Bow R M.D on 02/17/2016 at 1:04 PM  Between 7am to 6pm - Pager - [phone]  After 6pm go to [email or web address] - password [email or web address] [facility]  Office  [phone]  CC: Primary care physician; Mathews Argyle, MD  Note: This dictation was prepared with Dragon dictation along with smaller phrase technology. Any transcriptional errors that result from this process are unintentional.

## 2016-08-12 ENCOUNTER — Other Ambulatory Visit: Payer: Self-pay | Admitting: Geriatric Medicine

## 2016-08-12 DIAGNOSIS — R911 Solitary pulmonary nodule: Secondary | ICD-10-CM

## 2016-08-17 ENCOUNTER — Ambulatory Visit
Admission: RE | Admit: 2016-08-17 | Discharge: 2016-08-17 | Disposition: A | Discharge status: Home / Self Care | Payer: Medicare Other | Source: Ambulatory Visit | Attending: Geriatric Medicine | Admitting: Geriatric Medicine

## 2016-08-17 ENCOUNTER — Other Ambulatory Visit: Payer: Self-pay | Admitting: Geriatric Medicine

## 2016-08-17 DIAGNOSIS — R911 Solitary pulmonary nodule: Secondary | ICD-10-CM

## 2017-05-05 ENCOUNTER — Other Ambulatory Visit: Payer: Self-pay | Admitting: Interventional Cardiology

## 2017-05-05 DIAGNOSIS — I714 Abdominal aortic aneurysm, without rupture, unspecified: Secondary | ICD-10-CM

## 2017-05-21 ENCOUNTER — Ambulatory Visit (HOSPITAL_COMMUNITY)
Admission: RE | Admit: 2017-05-21 | Discharge: 2017-05-21 | Disposition: A | Discharge status: Home / Self Care | Payer: Medicare Other | Source: Ambulatory Visit | Attending: Cardiology | Admitting: Cardiology

## 2017-05-21 DIAGNOSIS — E785 Hyperlipidemia, unspecified: Secondary | ICD-10-CM | POA: Insufficient documentation

## 2017-05-21 DIAGNOSIS — Z87891 Personal history of nicotine dependence: Secondary | ICD-10-CM | POA: Diagnosis not present

## 2017-05-21 DIAGNOSIS — I714 Abdominal aortic aneurysm, without rupture, unspecified: Secondary | ICD-10-CM

## 2017-05-21 DIAGNOSIS — I2510 Atherosclerotic heart disease of native coronary artery without angina pectoris: Secondary | ICD-10-CM | POA: Diagnosis not present

## 2017-05-21 DIAGNOSIS — I10 Essential (primary) hypertension: Secondary | ICD-10-CM | POA: Diagnosis not present

## 2017-07-14 DIAGNOSIS — I10 Essential (primary) hypertension: Secondary | ICD-10-CM | POA: Insufficient documentation

## 2017-07-14 DIAGNOSIS — G5602 Carpal tunnel syndrome, left upper limb: Secondary | ICD-10-CM | POA: Insufficient documentation

## 2017-07-14 DIAGNOSIS — M545 Low back pain, unspecified: Secondary | ICD-10-CM | POA: Insufficient documentation

## 2017-07-14 DIAGNOSIS — I25119 Atherosclerotic heart disease of native coronary artery with unspecified angina pectoris: Secondary | ICD-10-CM | POA: Insufficient documentation

## 2017-07-14 DIAGNOSIS — G4730 Sleep apnea, unspecified: Secondary | ICD-10-CM | POA: Insufficient documentation

## 2017-07-14 DIAGNOSIS — M489 Spondylopathy, unspecified: Secondary | ICD-10-CM | POA: Insufficient documentation

## 2017-07-14 DIAGNOSIS — D369 Benign neoplasm, unspecified site: Secondary | ICD-10-CM | POA: Insufficient documentation

## 2017-08-03 ENCOUNTER — Ambulatory Visit: Payer: Medicare Other | Admitting: Interventional Cardiology

## 2017-08-03 ENCOUNTER — Encounter: Payer: Self-pay | Admitting: Interventional Cardiology

## 2017-08-03 VITALS — BP 126/78 | HR 60 | Ht 70.0 in | Wt 165.8 lb

## 2017-08-03 DIAGNOSIS — G4730 Sleep apnea, unspecified: Secondary | ICD-10-CM | POA: Diagnosis not present

## 2017-08-03 DIAGNOSIS — I700 Atherosclerosis of aorta: Secondary | ICD-10-CM | POA: Diagnosis not present

## 2017-08-03 DIAGNOSIS — I10 Essential (primary) hypertension: Secondary | ICD-10-CM | POA: Diagnosis not present

## 2017-08-03 DIAGNOSIS — K439 Ventral hernia without obstruction or gangrene: Secondary | ICD-10-CM

## 2017-08-03 DIAGNOSIS — I25119 Atherosclerotic heart disease of native coronary artery with unspecified angina pectoris: Secondary | ICD-10-CM

## 2017-08-03 NOTE — Patient Instructions (Signed)

## 2017-08-03 NOTE — Progress Notes (Signed)
Cardiology Office Note    Date:  08/03/2017   ID:  Brian [name], DOB 1941/01/10, MRN [id number]  PCP:  Brian Manes, MD  Cardiologist: Brian Grooms, MD   Chief Complaint  Patient presents with  . Coronary Artery Disease    History of Present Illness:  Brian [name] is a 77 y.o. male who presents for CAD, previous anterior myocardial infarction treated with angioplasty, exercise treadmill test September 2015 without evidence of ischemia, hypertension, hyperlipidemia, and cigarette smoking. Also has a history of sleep apnea.   He is doing okay.  Complains of some abdominal discomfort that developed after he had a vomiting episode at Christmas time.  No angina.  Denies dyspnea and orthopnea.  No lower extremity swelling.  No episodes of syncope.   Past Medical History:  Diagnosis Date  . Adenomatous polyp   . Carpal tunnel syndrome on left   . Cervical spine disease   . Coronary artery disease   . Hyperlipidemia   . Hypertension   . Low back pain   . Myocardial infarction ([place]) 1998  . Sleep apnea     Past Surgical History:  Procedure Laterality Date  . APPENDECTOMY    . bunions    . CATARACT EXTRACTION    . COLONOSCOPY WITH PROPOFOL N/A 02/19/2015   Procedure: COLONOSCOPY WITH PROPOFOL;  Surgeon: Brian Lame, MD;  Location: [facility] ENDOSCOPY;  Service: Endoscopy;  Laterality: N/A;  . HEMORRHOID BANDING    . HERNIA REPAIR    . lumbar disc protrussion  03/2010    Current Medications: Outpatient Medications Prior to Visit  Medication Sig Dispense Refill  . aspirin 81 MG tablet Take 81 mg by mouth daily.      Marland Kitchen atorvastatin (LIPITOR) 40 MG tablet Take 40 mg by mouth daily.      . Coenzyme Q10 (COQ10) 100 MG CAPS Take 1 capsule by mouth daily.      . multivitamin (THERAGRAN) per tablet Take 1 tablet by mouth daily.      Marland Kitchen oxyCODONE-acetaminophen (PERCOCET) 10-325 MG tablet Take one to two tablets by mouth every 4 to 6 hours as needed for pain.    Marland Kitchen quinapril  (ACCUPRIL) 10 MG tablet Take 10 mg by mouth daily.    . ciprofloxacin (CIPRO) 500 MG tablet Take 1 tablet (500 mg total) by mouth 2 (two) times daily. 14 tablet 0  . fluticasone (FLONASE) 50 MCG/ACT nasal spray Place 2 sprays into both nostrils at bedtime.  0  . metroNIDAZOLE (FLAGYL) 500 MG tablet Take 1 tablet (500 mg total) by mouth 3 (three) times daily. 30 tablet 0  . metroNIDAZOLE (FLAGYL) 500 MG tablet Take 1 tablet (500 mg total) by mouth 3 (three) times daily. 21 tablet 0   No facility-administered medications prior to visit.      Allergies:   Patient has no known allergies.   Social History   Socioeconomic History  . Marital status: Married    Spouse name: Brian [name]  . Number of children: 1  . Years of education: None  . Highest education level: None  Social Needs  . Financial resource strain: None  . Food insecurity - worry: None  . Food insecurity - inability: None  . Transportation needs - medical: None  . Transportation needs - non-medical: None  Occupational History  . Occupation: retired    Comment: Chemical engineer  Tobacco Use  . Smoking status: Current Every Day Smoker    Packs/day: 1.00  Years: 25.00    Pack years: 25.00    Types: Cigarettes  . Smokeless tobacco: Never Used  . Tobacco comment: 1/2 3/4 ppd  Substance and Sexual Activity  . Alcohol use: Yes    Alcohol/week: 0.0 oz    Comment: occasionally  . Drug use: No  . Sexual activity: None  Other Topics Concern  . None  Social History Narrative  . None     Family History:  The patient's family history includes Coronary artery disease in his brother; Heart failure in his mother; Hypertension in his father; Kidney disease in his father.   ROS:   Please see the history of present illness.    Low back discomfort, foot drop, kyphoscoliosis. All other systems reviewed and are negative.   PHYSICAL EXAM:   VS:  BP 126/78   Pulse 60   Ht 5\' 10"  (1.778 m)   Wt 165 lb 12.8 oz  (75.2 kg)   BMI 23.79 kg/m    GEN: Well nourished, well developed, in no acute distress  HEENT: normal  Neck: no JVD, carotid bruits, or masses Cardiac: RRR; no murmurs, rubs, or gallops,no edema  Respiratory:  clear to auscultation bilaterally, normal work of breathing GI: soft, nontender, nondistended, + BS.  Ventral hernia is noted MS: no deformity or atrophy  Skin: warm and dry, no rash Neuro:  Alert and Oriented x 3, Strength and sensation are intact Psych: euthymic mood, full affect  Wt Readings from Last 3 Encounters:  08/03/17 165 lb 12.8 oz (75.2 kg)  02/14/16 159 lb (72.1 kg)  11/25/15 162 lb 3.2 oz (73.6 kg)      Studies/Labs Reviewed:   EKG:  EKG sinus rhythm, QS pattern V1 through V3 with left axis deviation.  When compared to prior, no significant change.  Recent Labs: No results found for requested labs within last 8760 hours.   Lipid Panel No results found for: CHOL, TRIG, HDL, CHOLHDL, VLDL, LDLCALC, LDLDIRECT  Additional studies/ records that were reviewed today include:  Abdominal aortic aneurysm duplex performed in November 2000  Final Interpretation: Abdominal Aorta: There is evidence of abnormal dilitation of the Proximal Abdominal aorta. The largest aortic measurement is 2.7 cm. The largest aortic diameter remains essentially unchanged compared to prior exam. Previous diameter measurement was 2.4  cm obtained on 10/16.    ASSESSMENT:    1. Coronary artery disease involving native coronary artery of native heart with angina pectoris ([place])   2. Aortic calcification ([facility])   3. Essential hypertension   4. Sleep apnea, unspecified type   5. Ventral hernia without obstruction or gangrene      PLAN:  In order of problems listed above:  1. Doing well without anginal symptoms currently.  Stable and responsive to nitroglycerin when present. 2. Risk factor modification is we are doing for CAD. 3. Very well controlled on current medical  regimen. 4. CPAP 5. Newly diagnosed and instructed patient to follow-up with Dr. Felipa [name] concerning this.  Plan clinical follow-up in 1 year.  As noted above no significant abdominal aortic aneurysm is noted.    Medication Adjustments/Labs and Tests Ordered: Current medicines are reviewed at length with the patient today.  Concerns regarding medicines are outlined above.  Medication changes, Labs and Tests ordered today are listed in the Patient Instructions below. Patient Instructions  Medication Instructions:  Your physician recommends that you continue on your current medications as directed. Please refer to the Current Medication list given to you today.  Labwork:  None  Testing/Procedures: None  Follow-Up: Your physician wants you to follow-up in: 1 year with Dr. Tamala Julian.  You will receive a reminder letter in the mail two months in advance. If you don't receive a letter, please call our office to schedule the follow-up appointment.   Any Other Special Instructions Will Be Listed Below (If Applicable).     If you need a refill on your cardiac medications before your next appointment, please call your pharmacy.      Signed, Brian Grooms, MD  08/03/2017 11:52 AM    [facility] [address], [place], [place]  [address] Phone: [phone]; Fax: [phone]

## 2017-08-17 ENCOUNTER — Other Ambulatory Visit: Payer: Self-pay | Admitting: Geriatric Medicine

## 2017-08-17 DIAGNOSIS — F17208 Nicotine dependence, unspecified, with other nicotine-induced disorders: Secondary | ICD-10-CM

## 2017-08-23 ENCOUNTER — Ambulatory Visit
Admission: RE | Admit: 2017-08-23 | Discharge: 2017-08-23 | Disposition: A | Discharge status: Home / Self Care | Payer: Medicare Other | Source: Ambulatory Visit | Attending: Geriatric Medicine | Admitting: Geriatric Medicine

## 2017-08-23 DIAGNOSIS — F17208 Nicotine dependence, unspecified, with other nicotine-induced disorders: Secondary | ICD-10-CM

## 2018-09-19 DIAGNOSIS — F17200 Nicotine dependence, unspecified, uncomplicated: Secondary | ICD-10-CM | POA: Insufficient documentation

## 2018-10-23 ENCOUNTER — Encounter

## 2018-11-18 ENCOUNTER — Telehealth: Payer: Self-pay | Admitting: Physician Assistant

## 2018-11-18 NOTE — Telephone Encounter (Signed)
Spoke with patient who confirmed all demographics. Patient does not have a smart phone. Will have vitals ready for visit. Patient prefers phone visit.

## 2018-11-28 ENCOUNTER — Encounter: Payer: Self-pay | Admitting: Physician Assistant

## 2018-11-28 ENCOUNTER — Telehealth (INDEPENDENT_AMBULATORY_CARE_PROVIDER_SITE_OTHER): Payer: Medicare Other | Admitting: Physician Assistant

## 2018-11-28 ENCOUNTER — Other Ambulatory Visit: Payer: Self-pay

## 2018-11-28 VITALS — BP 129/81 | HR 65 | Temp 98.0°F | Ht 70.0 in | Wt 171.6 lb

## 2018-11-28 DIAGNOSIS — I25119 Atherosclerotic heart disease of native coronary artery with unspecified angina pectoris: Secondary | ICD-10-CM | POA: Diagnosis not present

## 2018-11-28 DIAGNOSIS — G4730 Sleep apnea, unspecified: Secondary | ICD-10-CM

## 2018-11-28 DIAGNOSIS — I10 Essential (primary) hypertension: Secondary | ICD-10-CM

## 2018-11-28 DIAGNOSIS — I714 Abdominal aortic aneurysm, without rupture, unspecified: Secondary | ICD-10-CM

## 2018-11-28 NOTE — Patient Instructions (Signed)
Medication Instructions:  Your physician recommends that you continue on your current medications as directed. Please refer to the Current Medication list given to you today.  If you need a refill on your cardiac medications before your next appointment, please call your pharmacy.   Lab work: NONE If you have labs (blood work) drawn today and your tests are completely normal, you will receive your results only by: Marland Kitchen MyChart Message (if you have MyChart) OR . A paper copy in the mail If you have any lab test that is abnormal or we need to change your treatment, we will call you to review the results.  Testing/Procedures: NONE  Follow-Up: At [facility], you and your health needs are our priority.  As part of our continuing mission to provide you with exceptional heart care, we have created designated Provider Care Teams.  These Care Teams include your primary Cardiologist (physician) and Advanced Practice Providers (APPs -  Physician Assistants and Nurse Practitioners) who all work together to provide you with the care you need, when you need it. You will need a follow up appointment in 12 months.  Please call our office 2 months in advance to schedule this appointment.  You may see Sinclair Grooms, MD or one of the following Advanced Practice Providers on your designated Care Team:   Truitt Merle, NP Cecilie Kicks, NP . Kathyrn Drown, NP

## 2018-11-28 NOTE — Progress Notes (Signed)
Virtual Visit via Telephone Note   This visit type was conducted due to national recommendations for restrictions regarding the COVID-19 Pandemic (e.g. social distancing) in an effort to limit this patient's exposure and mitigate transmission in our community.  Due to his co-morbid illnesses, this patient is at least at moderate risk for complications without adequate follow up.  This format is felt to be most appropriate for this patient at this time.  The patient did not have access to video technology/had technical difficulties with video requiring transitioning to audio format only (telephone).  All issues noted in this document were discussed and addressed.  No physical exam could be performed with this format.  Please refer to the patient's chart for his  consent to telehealth for [facility].   Date:  11/28/2018   ID:  [name], DOB [birth year], MRN [id number]  Patient Location: Home Provider Location: Home  PCP:  Lajean Manes, MD  Cardiologist:  Sinclair Grooms, MD  Electrophysiologist:  None   Evaluation Performed:  Follow-Up Visit  Chief Complaint:  Yearly follow up  History of Present Illness:    LUC SHAMMAS is a 78 y.o. male with hx of CAD, previous anterior myocardial infarction treated with angioplasty, hypertension, hyperlipidemia, OSA on CPAP and cigarette smoking seen for follow up.  Exercise treadmill test September 2015 without evidence of ischemia.  He was doing well when last seen by Dr. Tamala Julian 07/2017.   Patient continues to smoke less than 1 pack a day.  He has intermittent ankle swelling.  Compliant with low-sodium diet.  He denies chest pain, shortness of breath, orthopnea, PND, syncope, melena or blood in his stool or urine.  He is active doing yard work and walking outside every day.  Following social distancing during pandemic..  Wearing mask when goes outside for grocery pickup.   The patient does not have symptoms concerning for COVID-19  infection (fever, chills, cough, or new shortness of breath).    Past Medical History:  Diagnosis Date  . Adenomatous polyp   . Carpal tunnel syndrome on left   . Cervical spine disease   . Coronary artery disease   . Hyperlipidemia   . Hypertension   . Low back pain   . Myocardial infarction ([place]) 1998  . Sleep apnea    Past Surgical History:  Procedure Laterality Date  . APPENDECTOMY    . bunions    . CATARACT EXTRACTION    . COLONOSCOPY WITH PROPOFOL N/A 02/19/2015   Procedure: COLONOSCOPY WITH PROPOFOL;  Surgeon: Lucilla Lame, MD;  Location: [facility] ENDOSCOPY;  Service: Endoscopy;  Laterality: N/A;  . HEMORRHOID BANDING    . HERNIA REPAIR    . lumbar disc protrussion  03/2010     Current Meds  Medication Sig  . acetaminophen (TYLENOL) 325 MG tablet Take 325 mg by mouth daily as needed.  Marland Kitchen aspirin 81 MG tablet Take 81 mg by mouth daily.    Marland Kitchen atorvastatin (LIPITOR) 40 MG tablet Take 40 mg by mouth daily.    . Coenzyme Q10 (COQ10) 100 MG CAPS Take 1 capsule by mouth daily.    . Multiple Vitamin (MULTIVITAMIN PO) Take 1 tablet by mouth daily.  Marland Kitchen oxyCODONE-acetaminophen (PERCOCET) 10-325 MG tablet Take one to two tablets by mouth every 4 to 6 hours as needed for pain.  Marland Kitchen quinapril (ACCUPRIL) 10 MG tablet Take 10 mg by mouth daily.     Allergies:   Patient has no known allergies.  Social History   Tobacco Use  . Smoking status: Current Every Day Smoker    Packs/day: 1.00    Years: 25.00    Pack years: 25.00    Types: Cigarettes  . Smokeless tobacco: Never Used  . Tobacco comment: 1/2 3/4 ppd  Substance Use Topics  . Alcohol use: Yes    Alcohol/week: 0.0 standard drinks    Comment: occasionally  . Drug use: No     Family Hx: The patient's family history includes Coronary artery disease in his brother; Heart failure in his mother; Hypertension in his father; Kidney disease in his father.  ROS:   Please see the history of present illness.    All other systems  reviewed and are negative.   Prior CV studies:   The following studies were reviewed today:  As above  Labs/Other Tests and Data Reviewed:    EKG:  No ECG reviewed.  Recent Labs: No results found for requested labs within last 8760 hours.   Recent Lipid Panel No results found for: CHOL, TRIG, HDL, CHOLHDL, LDLCALC, LDLDIRECT  Wt Readings from Last 3 Encounters:  11/28/18 171 lb 9.6 oz (77.8 kg)  08/03/17 165 lb 12.8 oz (75.2 kg)  02/14/16 159 lb (72.1 kg)     Objective:    Vital Signs:  BP 129/81   Pulse 65   Temp 98 F (36.7 C)   Ht 5\' 10"  (1.778 m)   Wt 171 lb 9.6 oz (77.8 kg)   BMI 24.62 kg/m    VITAL SIGNS:  reviewed GEN:  no acute distress PSYCH:  normal affect  ASSESSMENT & PLAN:    1. CAD -No angina.  Continue current medical therapy.  2. HTN -Blood pressure stable on current medical therapy.  3. OSA on CPAP -Compliant.  4. AAA - Last study 05/2017 showed 2.7 CM   5.  Tobacco smoking -Continue to smoke less than half a pack per day.  Not interested in quitting.  COVID-19 Education: The signs and symptoms of COVID-19 were discussed with the patient and how to seek care for testing (follow up with PCP or arrange E-visit).  The importance of social distancing was discussed today.  Time:   Today, I have spent 9 minutes with the patient with telehealth technology discussing the above problems.     Medication Adjustments/Labs and Tests Ordered: Current medicines are reviewed at length with the patient today.  Concerns regarding medicines are outlined above.   Tests Ordered: No orders of the defined types were placed in this encounter.   Medication Changes: No orders of the defined types were placed in this encounter.   Disposition:  Follow up in 1 year(s)  Signed, Leanor Kail, PA  11/28/2018 3:14 PM    [facility]

## 2019-05-22 ENCOUNTER — Ambulatory Visit (INDEPENDENT_AMBULATORY_CARE_PROVIDER_SITE_OTHER): Payer: Medicare Other | Admitting: Gastroenterology

## 2019-05-22 ENCOUNTER — Other Ambulatory Visit: Payer: Self-pay

## 2019-05-22 ENCOUNTER — Encounter: Payer: Self-pay | Admitting: Gastroenterology

## 2019-05-22 VITALS — BP 126/83 | HR 81 | Temp 97.5°F | Ht 70.0 in | Wt 173.2 lb

## 2019-05-22 DIAGNOSIS — K625 Hemorrhage of anus and rectum: Secondary | ICD-10-CM | POA: Diagnosis not present

## 2019-05-22 DIAGNOSIS — N402 Nodular prostate without lower urinary tract symptoms: Secondary | ICD-10-CM | POA: Insufficient documentation

## 2019-05-22 DIAGNOSIS — Z79899 Other long term (current) drug therapy: Secondary | ICD-10-CM | POA: Insufficient documentation

## 2019-05-22 DIAGNOSIS — R197 Diarrhea, unspecified: Secondary | ICD-10-CM

## 2019-05-22 DIAGNOSIS — R911 Solitary pulmonary nodule: Secondary | ICD-10-CM | POA: Insufficient documentation

## 2019-05-22 DIAGNOSIS — G894 Chronic pain syndrome: Secondary | ICD-10-CM | POA: Insufficient documentation

## 2019-05-22 DIAGNOSIS — G8929 Other chronic pain: Secondary | ICD-10-CM | POA: Insufficient documentation

## 2019-05-22 MED ORDER — CIPROFLOXACIN HCL 500 MG PO TABS: 500.0000 mg | ORAL_TABLET | Freq: Two times a day (BID) | ORAL | 0 refills | Status: DC

## 2019-05-22 MED ORDER — METRONIDAZOLE 500 MG PO TABS: 500.0000 mg | ORAL_TABLET | Freq: Two times a day (BID) | ORAL | 0 refills | Status: DC

## 2019-05-22 MED ORDER — SUPREP BOWEL PREP KIT 17.5-3.13-1.6 GM/177ML PO SOLN: 1.0000 | ORAL | 0 refills | Status: DC

## 2019-05-22 NOTE — Progress Notes (Signed)
Gastroenterology Consultation  Referring Provider:     Lajean Manes, MD Primary Care Physician:  Lajean Manes, MD Primary Gastroenterologist:  Dr. Allen Norris     Reason for Consultation:     Hematochezia        HPI:   Brian [name] is a 78 y.o. y/o male referred for consultation & management of hematochezia by Dr. Lajean Manes, MD.  This patient comes in today after seeing me back in 2016 while he was in the hospital for rectal bleeding.  At the time of his hospitalization for rectal bleeding in 2016 he underwent a colonoscopy with biopsies that showed ischemic colitis.  The patient reports that he has had 2 similar episodes since then.  The patient's daughter is a Marine scientist at [employer] and she was concerned about why he has these episodes of bleeding.  He has only had the one colonoscopy by me and the other times he came in with rectal bleeding he was treated conservatively with antibiotics and sent home.  The patient states that he always has abdominal pain when he has the rectal bleeding.  There is no report of any recent unexplained weight loss fevers chills nausea or vomiting. The patient had a colonoscopy in [place] back in 2013 with an adenomatous polyp at that time and he has not had a repeat surveillance colonoscopy for his polyps.  Past Medical History:  Diagnosis Date  . Adenomatous polyp   . Carpal tunnel syndrome on left   . Cervical spine disease   . Coronary artery disease   . Hyperlipidemia   . Hypertension   . Low back pain   . Myocardial infarction ([place]) 1998  . Sleep apnea     Past Surgical History:  Procedure Laterality Date  . APPENDECTOMY    . bunions    . CATARACT EXTRACTION    . COLONOSCOPY WITH PROPOFOL N/A 02/19/2015   Procedure: COLONOSCOPY WITH PROPOFOL;  Surgeon: Lucilla Lame, MD;  Location: [facility] ENDOSCOPY;  Service: Endoscopy;  Laterality: N/A;  . HEMORRHOID BANDING    . HERNIA REPAIR    . lumbar disc protrussion  03/2010    Prior to Admission  medications   Medication Sig Start Date End Date Taking? Authorizing Provider  acetaminophen (TYLENOL) 325 MG tablet Take 325 mg by mouth daily as needed.   Yes [provider]  aspirin 81 MG tablet Take 81 mg by mouth daily.     Yes [provider]  atorvastatin (LIPITOR) 40 MG tablet Take 40 mg by mouth daily.     Yes [provider]  Coenzyme Q10 (COQ10) 100 MG CAPS Take 1 capsule by mouth daily.     Yes [provider]  Multiple Vitamin (MULTIVITAMIN PO) Take 1 tablet by mouth daily.   Yes [provider]  oxyCODONE-acetaminophen (PERCOCET) 10-325 MG tablet Take one to two tablets by mouth every 4 to 6 hours as needed for pain.   Yes [provider]  quinapril (ACCUPRIL) 10 MG tablet Take 10 mg by mouth daily.   Yes [provider]  ciprofloxacin (CIPRO) 500 MG tablet Take 1 tablet (500 mg total) by mouth 2 (two) times daily. 05/22/19   Lucilla Lame, MD  metroNIDAZOLE (FLAGYL) 500 MG tablet Take 1 tablet (500 mg total) by mouth 2 (two) times daily. 05/22/19   Lucilla Lame, MD  Na Sulfate-K Sulfate-Mg Sulf (SUPREP BOWEL PREP KIT) 17.5-3.13-1.6 GM/177ML SOLN Take 1 kit by mouth as directed. 05/22/19   Lucilla Lame,  MD    Family History  Problem Relation Age of Onset  . Kidney disease Father   . Hypertension Father   . Heart failure Mother   . Coronary artery disease Brother      Social History   Tobacco Use  . Smoking status: Current Every Day Smoker    Packs/day: 1.00    Years: 25.00    Pack years: 25.00    Types: Cigarettes  . Smokeless tobacco: Never Used  . Tobacco comment: 1/2 3/4 ppd  Substance Use Topics  . Alcohol use: Yes    Alcohol/week: 0.0 standard drinks    Comment: occasionally  . Drug use: No    Allergies as of 05/22/2019  . (No Known Allergies)    Review of Systems:    All systems reviewed and negative except where noted in HPI.   Physical Exam:  BP 126/83   Pulse 81   Temp (!) 97.5 F (36.4  C)   Ht _0  (1.778 m)   Wt 173 lb 3.2 oz (78.6 kg)   BMI 24.85 kg/m  No LMP for male patient. General:   Alert,  Well-developed, well-nourished, pleasant and cooperative in NAD Head:  Normocephalic and atraumatic. Eyes:  Sclera clear, no icterus.   Conjunctiva pink. Ears:  Normal auditory acuity. Neck:  Supple; no masses or thyromegaly. Lungs:  Respirations even and unlabored.  Clear throughout to auscultation.   No wheezes, crackles, or rhonchi. No acute distress. Heart:  Regular rate and rhythm; no murmurs, clicks, rubs, or gallops. Abdomen:  Normal bowel sounds.  No bruits.  Soft, non-tender and non-distended without masses, hepatosplenomegaly or hernias noted.  No guarding or rebound tenderness.  Negative Carnett sign.   Rectal:  Deferred.  Msk:  Symmetrical without gross deformities.  Good, equal movement & strength bilaterally. Pulses:  Normal pulses noted. Extremities:  No clubbing or edema.  No cyanosis. Neurologic:  Alert and oriented x3;  grossly normal neurologically. Skin:  Intact without significant lesions or rashes.  No jaundice. Lymph Nodes:  No significant cervical adenopathy. Psych:  Alert and cooperative. Normal mood and affect.  Imaging Studies: No results found.  Assessment and Plan:   Brian [name] is a 78 y.o. y/o male who comes in today with a history of rectal bleeding.  The patient had a colonoscopy back in 2016 by me that showed him to have ischemic colitis and in 2013 he had a colonoscopy at an outside institution that showed him to have an adenoma.  The patient is now concerned because he has had 2 episodes of rectal bleeding since his last colonoscopy with a fairly large amount of blood and requiring visiting the hospital emergency room once and the other 1 he called his doctor and got antibiotics on the weekend.  The patient would like ciprofloxacin and Flagyl to have with him in case he has another attack of the ischemic colitis and he will be given a  prescription for that.  The patient will also be set up for a colonoscopy since he has a history of an adenomatous polyp 7 years ago and recurrent rectal bleeding. I have discussed risks & benefits which include, but are not limited to, bleeding, infection, perforation & drug reaction.  The patient agrees with this plan & written consent will be obtained.     This visit consisted of 40 minutes face to face contact with myself and at least 50% of this time was spent in counseling and education regarding diagnosis,  treatment options, medication management, risks and benefits of treatment.  Lucilla Lame, MD. Marval Regal    Note: This dictation was prepared with Dragon dictation along with smaller phrase technology. Any transcriptional errors that result from this process are unintentional.

## 2019-05-22 NOTE — H&P (View-Only) (Signed)
Gastroenterology Consultation  Referring Provider:     Lajean Manes, MD Primary Care Physician:  Lajean Manes, MD Primary Gastroenterologist:  Dr. Allen Norris     Reason for Consultation:     Hematochezia        HPI:   Brian [name] is a 78 y.o. y/o male referred for consultation & management of hematochezia by Dr. Lajean Manes, MD.  This patient comes in today after seeing me back in 2016 while he was in the hospital for rectal bleeding.  At the time of his hospitalization for rectal bleeding in 2016 he underwent a colonoscopy with biopsies that showed ischemic colitis.  The patient reports that he has had 2 similar episodes since then.  The patient's daughter is a Marine scientist at [employer] and she was concerned about why he has these episodes of bleeding.  He has only had the one colonoscopy by me and the other times he came in with rectal bleeding he was treated conservatively with antibiotics and sent home.  The patient states that he always has abdominal pain when he has the rectal bleeding.  There is no report of any recent unexplained weight loss fevers chills nausea or vomiting. The patient had a colonoscopy in [place] back in 2013 with an adenomatous polyp at that time and he has not had a repeat surveillance colonoscopy for his polyps.  Past Medical History:  Diagnosis Date  . Adenomatous polyp   . Carpal tunnel syndrome on left   . Cervical spine disease   . Coronary artery disease   . Hyperlipidemia   . Hypertension   . Low back pain   . Myocardial infarction ([place]) 1998  . Sleep apnea     Past Surgical History:  Procedure Laterality Date  . APPENDECTOMY    . bunions    . CATARACT EXTRACTION    . COLONOSCOPY WITH PROPOFOL N/A 02/19/2015   Procedure: COLONOSCOPY WITH PROPOFOL;  Surgeon: Lucilla Lame, MD;  Location: [facility] END[facility]COPY;  Service: Endoscopy;  Laterality: N/A;  . HEMORRHOID BANDING    . HERNIA REPAIR    . lumbar disc protrussion  03/2010    Prior to Admission  medications   Medication Sig Start Date End Date Taking? Authorizing Provider  acetaminophen (TYLENOL) 325 MG tablet Take 325 mg by mouth daily as needed.   Yes [provider]  aspirin 81 MG tablet Take 81 mg by mouth daily.     Yes [provider]  atorvastatin (LIPITOR) 40 MG tablet Take 40 mg by mouth daily.     Yes [provider]  Coenzyme Q10 (COQ10) 100 MG CAPS Take 1 capsule by mouth daily.     Yes [provider]  Multiple Vitamin (MULTIVITAMIN PO) Take 1 tablet by mouth daily.   Yes [provider]  oxyCODONE-acetaminophen (PERCOCET) 10-325 MG tablet Take one to two tablets by mouth every 4 to 6 hours as needed for pain.   Yes [provider]  quinapril (ACCUPRIL) 10 MG tablet Take 10 mg by mouth daily.   Yes [provider]  ciprofloxacin (CIPRO) 500 MG tablet Take 1 tablet (500 mg total) by mouth 2 (two) times daily. 05/22/19   Lucilla Lame, MD  metroNIDAZOLE (FLAGYL) 500 MG tablet Take 1 tablet (500 mg total) by mouth 2 (two) times daily. 05/22/19   Lucilla Lame, MD  Na Sulfate-K Sulfate-Mg Sulf (SUPREP BOWEL PREP KIT) 17.5-3.13-1.6 GM/177ML SOLN Take 1 kit by mouth as directed. 05/22/19   Lucilla Lame,  MD    Family History  Problem Relation Age of Onset  . Kidney disease Father   . Hypertension Father   . Heart failure Mother   . Coronary artery disease Brother      Social History   Tobacco Use  . Smoking status: Current Every Day Smoker    Packs/day: 1.00    Years: 25.00    Pack years: 25.00    Types: Cigarettes  . Smokeless tobacco: Never Used  . Tobacco comment: 1/2 3/4 ppd  Substance Use Topics  . Alcohol use: Yes    Alcohol/week: 0.0 standard drinks    Comment: occasionally  . Drug use: No    Allergies as of 05/22/2019  . (No Known Allergies)    Review of Systems:    All systems reviewed and negative except where noted in HPI.   Physical Exam:  BP 126/83   Pulse 81   Temp (!) 97.5 F (36.4  C)   Ht _0  (1.778 m)   Wt 173 lb 3.2 oz (78.6 kg)   BMI 24.85 kg/m  No LMP for male patient. General:   Alert,  Well-developed, well-nourished, pleasant and cooperative in NAD Head:  Normocephalic and atraumatic. Eyes:  Sclera clear, no icterus.   Conjunctiva pink. Ears:  Normal auditory acuity. Neck:  Supple; no masses or thyromegaly. Lungs:  Respirations even and unlabored.  Clear throughout to auscultation.   No wheezes, crackles, or rhonchi. No acute distress. Heart:  Regular rate and rhythm; no murmurs, clicks, rubs, or gallops. Abdomen:  Normal bowel sounds.  No bruits.  Soft, non-tender and non-distended without masses, hepatosplenomegaly or hernias noted.  No guarding or rebound tenderness.  Negative Carnett sign.   Rectal:  Deferred.  Msk:  Symmetrical without gross deformities.  Good, equal movement & strength bilaterally. Pulses:  Normal pulses noted. Extremities:  No clubbing or edema.  No cyanosis. Neurologic:  Alert and oriented x3;  grossly normal neurologically. Skin:  Intact without significant lesions or rashes.  No jaundice. Lymph Nodes:  No significant cervical adenopathy. Psych:  Alert and cooperative. Normal mood and affect.  Imaging Studies: No results found.  Assessment and Plan:   Brian [name] is a 78 y.o. y/o male who comes in today with a history of rectal bleeding.  The patient had a colonoscopy back in 2016 by me that showed him to have ischemic colitis and in 2013 he had a colonoscopy at an outside institution that showed him to have an adenoma.  The patient is now concerned because he has had 2 episodes of rectal bleeding since his last colonoscopy with a fairly large amount of blood and requiring visiting the hospital emergency room once and the other 1 he called his doctor and got antibiotics on the weekend.  The patient would like ciprofloxacin and Flagyl to have with him in case he has another attack of the ischemic colitis and he will be given a  prescription for that.  The patient will also be set up for a colonoscopy since he has a history of an adenomatous polyp 7 years ago and recurrent rectal bleeding. I have discussed risks & benefits which include, but are not limited to, bleeding, infection, perforation & drug reaction.  The patient agrees with this plan & written consent will be obtained.     This visit consisted of 40 minutes face to face contact with myself and at least 50% of this time was spent in counseling and education regarding diagnosis,  treatment options, medication management, risks and benefits of treatment.  Lucilla Lame, MD. Marval Regal    Note: This dictation was prepared with Dragon dictation along with smaller phrase technology. Any transcriptional errors that result from this process are unintentional.

## 2019-05-26 ENCOUNTER — Other Ambulatory Visit: Payer: Self-pay | Admitting: Geriatric Medicine

## 2019-05-26 DIAGNOSIS — F17209 Nicotine dependence, unspecified, with unspecified nicotine-induced disorders: Secondary | ICD-10-CM

## 2019-05-29 ENCOUNTER — Encounter: Payer: Self-pay | Admitting: *Deleted

## 2019-05-29 ENCOUNTER — Telehealth: Payer: Self-pay | Admitting: Gastroenterology

## 2019-05-29 ENCOUNTER — Other Ambulatory Visit: Payer: Self-pay

## 2019-05-29 NOTE — Telephone Encounter (Signed)
Returned pt's call and he had a question regarding the pre op call from [place]. He stated he hadn't heard anything yet but told me she had called him this afternoon. Reminded pt of his COVID testing on Thursday.

## 2019-05-29 NOTE — Telephone Encounter (Signed)
Please call patient he has questions about his colonoscopy coming up.

## 2019-06-01 ENCOUNTER — Other Ambulatory Visit: Payer: Self-pay

## 2019-06-01 ENCOUNTER — Other Ambulatory Visit
Admission: RE | Admit: 2019-06-01 | Discharge: 2019-06-01 | Disposition: A | Discharge status: Home / Self Care | Payer: Medicare Other | Source: Ambulatory Visit | Attending: Gastroenterology | Admitting: Gastroenterology

## 2019-06-01 DIAGNOSIS — Z01812 Encounter for preprocedural laboratory examination: Secondary | ICD-10-CM | POA: Insufficient documentation

## 2019-06-01 DIAGNOSIS — Z20828 Contact with and (suspected) exposure to other viral communicable diseases: Secondary | ICD-10-CM | POA: Diagnosis not present

## 2019-06-01 LAB — SARS CORONAVIRUS 2 (TAT 6-24 HRS): SARS Coronavirus 2: NEGATIVE

## 2019-06-01 NOTE — Anesthesia Preprocedure Evaluation (Addendum)
Anesthesia Evaluation  Patient identified by MRN, date of birth, ID band Patient awake    Reviewed: Allergy & Precautions, NPO status , Patient's Chart, lab work & pertinent test results  History of Anesthesia Complications Negative for: history of anesthetic complications  Airway Mallampati: III   Neck ROM: Full    Dental  (+) Upper Dentures   Pulmonary sleep apnea and Continuous Positive Airway Pressure Ventilation , Current Smoker (1 ppd)Patient did not abstain from smoking.,    Pulmonary exam normal breath sounds clear to auscultation       Cardiovascular hypertension, + CAD (s/p MI 1998 and stents x2)  Normal cardiovascular exam Rhythm:Regular Rate:Normal  AAA, 2.7cm in 2018   Neuro/Psych negative neurological ROS     GI/Hepatic negative GI ROS,   Endo/Other  negative endocrine ROS  Renal/GU negative Renal ROS     Musculoskeletal   Abdominal   Peds  Hematology negative hematology ROS (+)   Anesthesia Other Findings   Reproductive/Obstetrics                            Anesthesia Physical Anesthesia Plan  ASA: III  Anesthesia Plan: General   Post-op Pain Management:    Induction: Intravenous  PONV Risk Score and Plan: 1 and Propofol infusion, TIVA and Treatment may vary due to age or medical condition  Airway Management Planned: Natural Airway  Additional Equipment:   Intra-op Plan:   Post-operative Plan:   Informed Consent: I have reviewed the patients History and Physical, chart, labs and discussed the procedure including the risks, benefits and alternatives for the proposed anesthesia with the patient or authorized representative who has indicated his/her understanding and acceptance.       Plan Discussed with: CRNA  Anesthesia Plan Comments:        Anesthesia Quick Evaluation

## 2019-06-02 NOTE — Discharge Instructions (Signed)

## 2019-06-05 ENCOUNTER — Encounter
Admission: RE | Disposition: A | Discharge status: Home / Self Care | Payer: Self-pay | Source: Home / Self Care | Attending: Gastroenterology

## 2019-06-05 ENCOUNTER — Ambulatory Visit: Payer: Medicare Other | Admitting: Anesthesiology

## 2019-06-05 ENCOUNTER — Ambulatory Visit
Admission: RE | Admit: 2019-06-05 | Discharge: 2019-06-05 | Disposition: A | Discharge status: Home / Self Care | Payer: Medicare Other | Attending: Gastroenterology | Admitting: Gastroenterology

## 2019-06-05 ENCOUNTER — Other Ambulatory Visit: Payer: Self-pay

## 2019-06-05 ENCOUNTER — Ambulatory Visit: Payer: Medicare Other

## 2019-06-05 DIAGNOSIS — D122 Benign neoplasm of ascending colon: Secondary | ICD-10-CM | POA: Diagnosis not present

## 2019-06-05 DIAGNOSIS — Z8249 Family history of ischemic heart disease and other diseases of the circulatory system: Secondary | ICD-10-CM | POA: Insufficient documentation

## 2019-06-05 DIAGNOSIS — Z79899 Other long term (current) drug therapy: Secondary | ICD-10-CM | POA: Insufficient documentation

## 2019-06-05 DIAGNOSIS — K648 Other hemorrhoids: Secondary | ICD-10-CM | POA: Diagnosis not present

## 2019-06-05 DIAGNOSIS — I2510 Atherosclerotic heart disease of native coronary artery without angina pectoris: Secondary | ICD-10-CM | POA: Insufficient documentation

## 2019-06-05 DIAGNOSIS — E785 Hyperlipidemia, unspecified: Secondary | ICD-10-CM | POA: Diagnosis not present

## 2019-06-05 DIAGNOSIS — Z7982 Long term (current) use of aspirin: Secondary | ICD-10-CM | POA: Insufficient documentation

## 2019-06-05 DIAGNOSIS — I10 Essential (primary) hypertension: Secondary | ICD-10-CM | POA: Diagnosis not present

## 2019-06-05 DIAGNOSIS — F17210 Nicotine dependence, cigarettes, uncomplicated: Secondary | ICD-10-CM | POA: Insufficient documentation

## 2019-06-05 DIAGNOSIS — I252 Old myocardial infarction: Secondary | ICD-10-CM | POA: Diagnosis not present

## 2019-06-05 DIAGNOSIS — K635 Polyp of colon: Secondary | ICD-10-CM | POA: Diagnosis not present

## 2019-06-05 DIAGNOSIS — D125 Benign neoplasm of sigmoid colon: Secondary | ICD-10-CM | POA: Diagnosis not present

## 2019-06-05 DIAGNOSIS — G4730 Sleep apnea, unspecified: Secondary | ICD-10-CM | POA: Diagnosis not present

## 2019-06-05 DIAGNOSIS — K5730 Diverticulosis of large intestine without perforation or abscess without bleeding: Secondary | ICD-10-CM | POA: Diagnosis not present

## 2019-06-05 DIAGNOSIS — K921 Melena: Secondary | ICD-10-CM

## 2019-06-05 HISTORY — DX: Presence of dental prosthetic device (complete) (partial): Z97.2

## 2019-06-05 HISTORY — PX: POLYPECTOMY: SHX5525

## 2019-06-05 HISTORY — PX: COLONOSCOPY WITH PROPOFOL: SHX5780

## 2019-06-05 SURGERY — COLONOSCOPY WITH PROPOFOL
Anesthesia: General | Site: Rectum

## 2019-06-05 MED ORDER — LIDOCAINE HCL (CARDIAC) PF 100 MG/5ML IV SOSY
PREFILLED_SYRINGE | INTRAVENOUS | Status: DC | PRN
  Administered 2019-06-05: 60 mg via INTRAVENOUS

## 2019-06-05 MED ORDER — PROPOFOL 10 MG/ML IV BOLUS
INTRAVENOUS | Status: DC | PRN
  Administered 2019-06-05: 50 mg via INTRAVENOUS
  Administered 2019-06-05: 60 mg via INTRAVENOUS
  Administered 2019-06-05 (×2): 40 mg via INTRAVENOUS

## 2019-06-05 MED ORDER — STERILE WATER FOR IRRIGATION IR SOLN
Status: DC | PRN
  Administered 2019-06-05: 50 mL

## 2019-06-05 MED ORDER — LACTATED RINGERS IV SOLN
10.0000 mL/h | INTRAVENOUS | Status: DC
  Administered 2019-06-05: 09:00:00 10 mL/h via INTRAVENOUS

## 2019-06-05 SURGICAL SUPPLY — 8 items
CANISTER SUCT 1200ML W/VALVE (MISCELLANEOUS) ×3 IMPLANT
FORCEPS BIOP RAD 4 LRG CAP 4 (CUTTING FORCEPS) ×2 IMPLANT
GOWN CVR UNV OPN BCK APRN NK (MISCELLANEOUS) ×2 IMPLANT
GOWN ISOL THUMB LOOP REG UNIV (MISCELLANEOUS) ×6
KIT ENDO PROCEDURE OLY (KITS) ×3 IMPLANT
SNARE SHORT THROW 13M SML OVAL (MISCELLANEOUS) ×2 IMPLANT
TRAP ETRAP POLY (MISCELLANEOUS) ×2 IMPLANT
WATER STERILE IRR 250ML POUR (IV SOLUTION) ×3 IMPLANT

## 2019-06-05 NOTE — Op Note (Signed)
[facility] Patient Name: Brian [name] Procedure Date: 06/05/2019 9:57 AM MRN: [id number] Account #: [id number] Date of Birth: [birth year] Admit Type: Outpatient Age: 78 Room: [facility] OR ROOM 01 Gender: Male Note Status: Finalized Procedure:             Colonoscopy Indications:           Hematochezia Providers:             Lucilla Lame MD, MD Referring MD:          Adelfa Koh. Stoneking MD, MD (Referring MD) Medicines:             Propofol per Anesthesia Complications:         No immediate complications. Procedure:             Pre-Anesthesia Assessment:                        - Prior to the procedure, a History and Physical was                         performed, and patient medications and allergies were                         reviewed. The patient's tolerance of previous                         anesthesia was also reviewed. The risks and benefits                         of the procedure and the sedation options and risks                         were discussed with the patient. All questions were                         answered, and informed consent was obtained. Prior                         Anticoagulants: The patient has taken no previous                         anticoagulant or antiplatelet agents. ASA Grade                         Assessment: II - A patient with mild systemic disease.                         After reviewing the risks and benefits, the patient                         was deemed in satisfactory condition to undergo the                         procedure.                        After obtaining informed consent, the colonoscope was  passed under direct vision. Throughout the procedure,                         the patient's blood pressure, pulse, and oxygen                         saturations were monitored continuously. The was                         introduced through the anus and advanced to the the                 cecum, identified by appendiceal orifice and ileocecal                         valve. The colonoscopy was performed without                         difficulty. The patient tolerated the procedure well.                         The quality of the bowel preparation was excellent. Findings:      The perianal and digital rectal examinations were normal.      A 4 mm polyp was found in the ascending colon. The polyp was sessile.       The polyp was removed with a cold snare. Resection and retrieval were       complete.      A 2 mm polyp was found in the sigmoid colon. The polyp was sessile. The       polyp was removed with a cold biopsy forceps. Resection and retrieval       were complete.      A few small-mouthed diverticula were found in the sigmoid colon.      Non-bleeding internal hemorrhoids were found during retroflexion. The       hemorrhoids were Grade I (internal hemorrhoids that do not prolapse). Impression:            - One 4 mm polyp in the ascending colon, removed with                         a cold snare. Resected and retrieved.                        - One 2 mm polyp in the sigmoid colon, removed with a                         cold biopsy forceps. Resected and retrieved.                        - Diverticulosis in the sigmoid colon.                        - Non-bleeding internal hemorrhoids. Recommendation:        - Discharge patient to home.                        - Resume previous diet.                        -  Continue present medications.                        - Await pathology results. Procedure Code(s):     --- Professional ---                        [phone], Colonoscopy, flexible; with removal of                         tumor(s), polyp(s), or other lesion(s) by snare                         technique                        [id number], 55, Colonoscopy, flexible; with biopsy, single                         or multiple Diagnosis Code(s):     --- Professional ---                         K92.1, Melena (includes Hematochezia)                        K63.5, Polyp of colon CPT copyright 2019 American Medical Association. All rights reserved. The codes documented in this report are preliminary and upon coder review may  be revised to meet current compliance requirements. Lucilla Lame MD, MD 06/05/2019 10:30:41 AM This report has been signed electronically. Number of Addenda: 0 Note Initiated On: 06/05/2019 9:57 AM Scope Withdrawal Time: 0 hours 7 minutes 23 seconds  Total Procedure Duration: 0 hours 18 minutes 15 seconds  Estimated Blood Loss:  Estimated blood loss: none.      [facility]

## 2019-06-05 NOTE — Anesthesia Postprocedure Evaluation (Signed)
Anesthesia Post Note  Patient: Brian [name]  Procedure(s) Performed: COLONOSCOPY WITH BIOPSY (N/A Rectum) POLYPECTOMY (N/A Rectum)     Patient location during evaluation: PACU Anesthesia Type: General Level of consciousness: awake and alert, oriented and patient cooperative Pain management: pain level controlled Vital Signs Assessment: post-procedure vital signs reviewed and stable Respiratory status: spontaneous breathing, nonlabored ventilation and respiratory function stable Cardiovascular status: blood pressure returned to baseline and stable Postop Assessment: adequate PO intake Anesthetic complications: no    Darrin Nipper

## 2019-06-05 NOTE — Interval H&P Note (Signed)
History and Physical Interval Note:  06/05/2019 9:25 AM  Brian [name]  has presented today for surgery, with the diagnosis of Diarrhea R19.7.  The various methods of treatment have been discussed with the patient and family. After consideration of risks, benefits and other options for treatment, the patient has consented to  Procedure(s) with comments: COLONOSCOPY WITH PROPOFOL (N/A) - sleep apnea as a surgical intervention.  The patient's history has been reviewed, patient examined, no change in status, stable for surgery.  I have reviewed the patient's chart and labs.  Questions were answered to the patient's satisfaction.     Genoa Freyre [employer]

## 2019-06-05 NOTE — Transfer of Care (Signed)
Immediate Anesthesia Transfer of Care Note  Patient: Brian [name]  Procedure(s) Performed: COLONOSCOPY WITH BIOPSY (N/A Rectum) POLYPECTOMY (N/A Rectum)  Patient Location: PACU  Anesthesia Type: General  Level of Consciousness: awake, alert  and patient cooperative  Airway and Oxygen Therapy: Patient Spontanous Breathing and Patient connected to supplemental oxygen  Post-op Assessment: Post-op Vital signs reviewed, Patient's Cardiovascular Status Stable, Respiratory Function Stable, Patent Airway and No signs of Nausea or vomiting  Post-op Vital Signs: Reviewed and stable  Complications: No apparent anesthesia complications

## 2019-06-06 ENCOUNTER — Encounter: Payer: Self-pay | Admitting: Gastroenterology

## 2019-06-07 ENCOUNTER — Encounter: Payer: Self-pay | Admitting: Gastroenterology

## 2019-06-09 ENCOUNTER — Ambulatory Visit
Admission: RE | Admit: 2019-06-09 | Discharge: 2019-06-09 | Disposition: A | Discharge status: Home / Self Care | Payer: Medicare Other | Source: Ambulatory Visit | Attending: Geriatric Medicine | Admitting: Geriatric Medicine

## 2019-06-09 DIAGNOSIS — F17209 Nicotine dependence, unspecified, with unspecified nicotine-induced disorders: Secondary | ICD-10-CM

## 2019-08-24 ENCOUNTER — Other Ambulatory Visit: Payer: Self-pay | Admitting: Orthopaedic Surgery

## 2019-08-24 DIAGNOSIS — M25879 Other specified joint disorders, unspecified ankle and foot: Secondary | ICD-10-CM

## 2019-08-31 ENCOUNTER — Ambulatory Visit
Admission: RE | Admit: 2019-08-31 | Discharge: 2019-08-31 | Disposition: A | Discharge status: Home / Self Care | Payer: Medicare Other | Source: Ambulatory Visit | Attending: Orthopaedic Surgery | Admitting: Orthopaedic Surgery

## 2019-08-31 ENCOUNTER — Other Ambulatory Visit: Payer: Self-pay

## 2019-08-31 DIAGNOSIS — M25879 Other specified joint disorders, unspecified ankle and foot: Secondary | ICD-10-CM

## 2019-09-13 NOTE — Progress Notes (Signed)
Cardiology Office Note:    Date:  09/14/2019   ID:  Brian [name], DOB [birth year], MRN [id number]  PCP:  Lajean Manes, MD  Cardiologist:  Sinclair Grooms, MD   Referring MD: Lajean Manes, MD   Chief Complaint  Patient presents with  . Coronary Artery Disease    History of Present Illness:    Brian [name] is a 79 y.o. male with a hx of CAD, previous anterior myocardial infarction treated with angioplasty, hypertension, hyperlipidemia, OSA on CPAP and cigarette smoking   The patient is very sedentary at this point.  He has lost the ability to walk for exercise due to his significant plantar bone spur that causes significant pain with walking.  He is planning to have a podiatric surgical procedure to correct the problem in March.  He has not had angina.  No palpitations.  No nitroglycerin use.  He has not had episodes of syncope.  He simply does not know how his heart situation is doing and has no way to be risk averse now that he cannot exercise.  Past Medical History:  Diagnosis Date  . Adenomatous polyp   . Carpal tunnel syndrome on left   . Cervical spine disease   . Coronary artery disease   . Hyperlipidemia   . Hypertension   . Low back pain   . Myocardial infarction ([place]) 1998  . Sleep apnea    CPAP  . Wears dentures    full upper    Past Surgical History:  Procedure Laterality Date  . APPENDECTOMY    . bunions    . CATARACT EXTRACTION    . COLONOSCOPY WITH PROPOFOL N/A 02/19/2015   Procedure: COLONOSCOPY WITH PROPOFOL;  Surgeon: Lucilla Lame, MD;  Location: [facility] ENDOSCOPY;  Service: Endoscopy;  Laterality: N/A;  . COLONOSCOPY WITH PROPOFOL N/A 06/05/2019   Procedure: COLONOSCOPY WITH BIOPSY;  Surgeon: Lucilla Lame, MD;  Location: [place];  Service: Endoscopy;  Laterality: N/A;  sleep apnea  . HEMORRHOID BANDING    . HERNIA REPAIR    . lumbar disc protrussion  03/2010  . POLYPECTOMY N/A 06/05/2019   Procedure: POLYPECTOMY;  Surgeon: Lucilla Lame, MD;  Location: [place];  Service: Endoscopy;  Laterality: N/A;    Current Medications: Current Meds  Medication Sig  . acetaminophen (TYLENOL) 325 MG tablet Take 325 mg by mouth daily as needed.  Marland Kitchen aspirin 81 MG tablet Take 81 mg by mouth daily.    Marland Kitchen atorvastatin (LIPITOR) 40 MG tablet Take 40 mg by mouth daily.    . ciprofloxacin (CIPRO) 500 MG tablet Take 1 tablet (500 mg total) by mouth 2 (two) times daily.  . Coenzyme Q10 (COQ10) 100 MG CAPS Take 1 capsule by mouth daily.    . metroNIDAZOLE (FLAGYL) 500 MG tablet Take 1 tablet (500 mg total) by mouth 2 (two) times daily.  . Multiple Vitamin (MULTIVITAMIN PO) Take 1 tablet by mouth daily.  Marland Kitchen oxyCODONE-acetaminophen (PERCOCET) 10-325 MG tablet Take one to two tablets by mouth every 4 to 6 hours as needed for pain.  Marland Kitchen quinapril (ACCUPRIL) 10 MG tablet Take 10 mg by mouth daily.     Allergies:   Patient has no known allergies.   Social History   Socioeconomic History  . Marital status: Married    Spouse name: [name]  . Number of children: 1  . Years of education: Not on file  . Highest education level: Not on file  Occupational History  .  Occupation: retired    Comment: Chemical engineer  Tobacco Use  . Smoking status: Current Every Day Smoker    Packs/day: 1.00    Years: 60.00    Pack years: 60.00    Types: Cigarettes  . Smokeless tobacco: Never Used  . Tobacco comment: 1/2 3/4 ppd (since age 69)  Substance and Sexual Activity  . Alcohol use: Not Currently    Alcohol/week: 0.0 standard drinks    Comment: occasionally  . Drug use: No  . Sexual activity: Not on file  Other Topics Concern  . Not on file  Social History Narrative  . Not on file   Social Determinants of Health   Financial Resource Strain:   . Difficulty of Paying Living Expenses: Not on file  Food Insecurity:   . Worried About Charity fundraiser in the Last Year: Not on file  . Ran Out of Food in the Last  Year: Not on file  Transportation Needs:   . Lack of Transportation (Medical): Not on file  . Lack of Transportation (Non-Medical): Not on file  Physical Activity:   . Days of Exercise per Week: Not on file  . Minutes of Exercise per Session: Not on file  Stress:   . Feeling of Stress : Not on file  Social Connections:   . Frequency of Communication with Friends and Family: Not on file  . Frequency of Social Gatherings with Friends and Family: Not on file  . Attends Religious Services: Not on file  . Active Member of Clubs or Organizations: Not on file  . Attends Archivist Meetings: Not on file  . Marital Status: Not on file     Family History: The patient's family history includes Coronary artery disease in his brother; Heart failure in his mother; Hypertension in his father; Kidney disease in his father.  ROS:   Please see the history of present illness.    Concerned about abdominal aortic aneurysm.  Last evaluated in November 2018.  Concerned about not being able to exercise.  All other systems reviewed and are negative.  EKGs/Labs/Other Studies Reviewed:    The following studies were reviewed today:  Aneurysm duplex surveillance 05/21/2017:  Final Interpretation:  Abdominal Aorta: There is evidence of abnormal dilitation of the Proximal  Abdominal aorta. The largest aortic measurement is 2.7 cm. The largest  aortic diameter remains essentially unchanged compared to prior exam.  Previous diameter measurement was 2.4  cm obtained on 10/16.  Stenosis: +--------------------+-------------+  Location      Stenosis     +--------------------+-------------+  Distal Aorta    <50% stenosis  +--------------------+-------------+  Right Common Iliac <50% stenosis  +--------------------+-------------+  Left Common Iliac  <50% stenosis  +--------------------+-------------+  Right External Iliac<50% stenosis    +--------------------+-------------+  Left External Iliac <50% stenosis  +--------------------+-------------+  EKG:  EKG normal sinus rhythm, poor R wave progression V1 through V4, left anterior hemiblock, incomplete right bundle branch block.  When compared to the last prior EKG from 2019, poor R wave progression is more extensive on today's tracing.  The difference is probably related to lead placement.  Recent Labs: No results found for requested labs within last 8760 hours.  Recent Lipid Panel No results found for: CHOL, TRIG, HDL, CHOLHDL, VLDL, LDLCALC, LDLDIRECT  Physical Exam:    VS:  BP 112/68   Pulse 77   Ht 5\' 10"  (1.778 m)   Wt 168 lb 6.4 oz (76.4 kg)   SpO2 95%  BMI 24.16 kg/m     Wt Readings from Last 3 Encounters:  09/14/19 168 lb 6.4 oz (76.4 kg)  06/05/19 163 lb (73.9 kg)  05/22/19 173 lb 3.2 oz (78.6 kg)     GEN: Slender. No acute distress HEENT: Normal NECK: No JVD. LYMPHATICS: No lymphadenopathy CARDIAC:  RRR without murmur, gallop, or edema. VASCULAR:  Normal Pulses. No bruits. RESPIRATORY:  Clear to auscultation without rales, wheezing or rhonchi  ABDOMEN: A ventral hernia is noted.  Soft, non-tender, non-distended, No pulsatile mass, MUSCULOSKELETAL: No deformity  SKIN: Warm and dry NEUROLOGIC:  Alert and oriented x 3 PSYCHIATRIC:  Normal affect   ASSESSMENT:    1. Coronary artery disease involving native coronary artery of native heart with angina pectoris ([place])   2. Essential hypertension   3. Abdominal aortic aneurysm (AAA) without rupture ([place])   4. Sleep apnea, unspecified type   5. Educated about COVID-19 virus infection   6. Preoperative cardiovascular examination   7. Hyperlipidemia LDL goal <70    PLAN:    In order of problems listed above:  1. Secondary prevention is discussed.  No symptoms of angina. 2. Blood pressure is excellent. 3. We will do a abdominal ultrasound/duplex to reassess the "abdominal aortic  aneurysm". 4. Compliant with CPAP 5. He is interested in taking the Covid 19 vaccine but has been slow to move forward because of some apprehension.  He is currently trying to sign up. 6. He is cleared for upcoming podiatric surgery without restrictions. 7. Target LDL is being achieved.    Overall education and awareness concerning primary/secondary risk prevention was discussed in detail: LDL less than 70, hemoglobin A1c less than 7, blood pressure target less than 130/80 mmHg, >150 minutes of moderate aerobic activity per week, avoidance of smoking, weight control (via diet and exercise), and continued surveillance/management of/for obstructive sleep apnea.    Medication Adjustments/Labs and Tests Ordered: Current medicines are reviewed at length with the patient today.  Concerns regarding medicines are outlined above.  Orders Placed This Encounter  Procedures  . EKG 12-Lead  . VAS [place] AAA DUPLEX   No orders of the defined types were placed in this encounter.   Patient Instructions  Medication Instructions:  Your physician recommends that you continue on your current medications as directed. Please refer to the Current Medication list given to you today.  *If you need a refill on your cardiac medications before your next appointment, please call your pharmacy*   Lab Work: None If you have labs (blood work) drawn today and your tests are completely normal, you will receive your results only by: Marland Kitchen MyChart Message (if you have MyChart) OR . A paper copy in the mail If you have any lab test that is abnormal or we need to change your treatment, we will call you to review the results.   Testing/Procedures: Your physician has requested that you have an abdominal aorta duplex. During this test, an ultrasound is used to evaluate the aorta. Allow 30 minutes for this exam. Do not eat after midnight the day before and avoid carbonated beverages    Follow-Up: At [facility], you and  your health needs are our priority.  As part of our continuing mission to provide you with exceptional heart care, we have created designated Provider Care Teams.  These Care Teams include your primary Cardiologist (physician) and Advanced Practice Providers (APPs -  Physician Assistants and Nurse Practitioners) who all work together to provide you with the care  you need, when you need it.  We recommend signing up for the patient portal called "MyChart".  Sign up information is provided on this After Visit Summary.  MyChart is used to connect with patients for Virtual Visits (Telemedicine).  Patients are able to view lab/test results, encounter notes, upcoming appointments, etc.  Non-urgent messages can be sent to your provider as well.   To learn more about what you can do with MyChart, go to [email or web address].    Your next appointment:   12 month(s)  The format for your next appointment:   In Person  Provider:   You may see Sinclair Grooms, MD or one of the following Advanced Practice Providers on your designated Care Team:    Truitt Merle, NP  Cecilie Kicks, NP  Kathyrn Drown, NP    Other Instructions      Signed, Sinclair Grooms, MD  09/14/2019 4:56 PM    [place]

## 2019-09-14 ENCOUNTER — Ambulatory Visit: Payer: Medicare Other | Admitting: Interventional Cardiology

## 2019-09-14 ENCOUNTER — Other Ambulatory Visit: Payer: Self-pay

## 2019-09-14 ENCOUNTER — Encounter: Payer: Self-pay | Admitting: Interventional Cardiology

## 2019-09-14 VITALS — BP 112/68 | HR 77 | Ht 70.0 in | Wt 168.4 lb

## 2019-09-14 DIAGNOSIS — I714 Abdominal aortic aneurysm, without rupture, unspecified: Secondary | ICD-10-CM

## 2019-09-14 DIAGNOSIS — G4730 Sleep apnea, unspecified: Secondary | ICD-10-CM | POA: Diagnosis not present

## 2019-09-14 DIAGNOSIS — I25119 Atherosclerotic heart disease of native coronary artery with unspecified angina pectoris: Secondary | ICD-10-CM

## 2019-09-14 DIAGNOSIS — I10 Essential (primary) hypertension: Secondary | ICD-10-CM

## 2019-09-14 DIAGNOSIS — Z01810 Encounter for preprocedural cardiovascular examination: Secondary | ICD-10-CM

## 2019-09-14 DIAGNOSIS — Z7189 Other specified counseling: Secondary | ICD-10-CM

## 2019-09-14 DIAGNOSIS — E785 Hyperlipidemia, unspecified: Secondary | ICD-10-CM

## 2019-09-14 NOTE — Patient Instructions (Signed)
Medication Instructions:  Your physician recommends that you continue on your current medications as directed. Please refer to the Current Medication list given to you today.  *If you need a refill on your cardiac medications before your next appointment, please call your pharmacy*   Lab Work: None If you have labs (blood work) drawn today and your tests are completely normal, you will receive your results only by: Marland Kitchen MyChart Message (if you have MyChart) OR . A paper copy in the mail If you have any lab test that is abnormal or we need to change your treatment, we will call you to review the results.   Testing/Procedures: Your physician has requested that you have an abdominal aorta duplex. During this test, an ultrasound is used to evaluate the aorta. Allow 30 minutes for this exam. Do not eat after midnight the day before and avoid carbonated beverages    Follow-Up: At [facility], you and your health needs are our priority.  As part of our continuing mission to provide you with exceptional heart care, we have created designated Provider Care Teams.  These Care Teams include your primary Cardiologist (physician) and Advanced Practice Providers (APPs -  Physician Assistants and Nurse Practitioners) who all work together to provide you with the care you need, when you need it.  We recommend signing up for the patient portal called "MyChart".  Sign up information is provided on this After Visit Summary.  MyChart is used to connect with patients for Virtual Visits (Telemedicine).  Patients are able to view lab/test results, encounter notes, upcoming appointments, etc.  Non-urgent messages can be sent to your provider as well.   To learn more about what you can do with MyChart, go to [email or web address].    Your next appointment:   12 month(s)  The format for your next appointment:   In Person  Provider:   You may see Sinclair Grooms, MD or one of the following Advanced Practice  Providers on your designated Care Team:    Truitt Merle, NP  Cecilie Kicks, NP  Kathyrn Drown, NP    Other Instructions

## 2019-10-17 ENCOUNTER — Other Ambulatory Visit: Payer: Self-pay

## 2019-10-17 ENCOUNTER — Ambulatory Visit (HOSPITAL_COMMUNITY)
Admission: RE | Admit: 2019-10-17 | Discharge: 2019-10-17 | Disposition: A | Discharge status: Home / Self Care | Payer: Medicare Other | Source: Ambulatory Visit | Attending: Cardiovascular Disease | Admitting: Cardiovascular Disease

## 2019-10-17 DIAGNOSIS — I714 Abdominal aortic aneurysm, without rupture, unspecified: Secondary | ICD-10-CM

## 2020-01-23 ENCOUNTER — Telehealth: Payer: Self-pay | Admitting: *Deleted

## 2020-01-23 ENCOUNTER — Other Ambulatory Visit: Payer: Self-pay | Admitting: *Deleted

## 2020-01-23 DIAGNOSIS — M79674 Pain in right toe(s): Secondary | ICD-10-CM

## 2020-01-23 NOTE — Telephone Encounter (Signed)
Received pt's my chart message concerning Eliquis today (01/19/2020).  Called and spoke to pt's wife who stated that she sent the message and did not realize she was in her husband's My Chart and sent message concerning Eliquis.

## 2020-01-31 ENCOUNTER — Encounter: Payer: Self-pay | Admitting: Surgery

## 2020-01-31 ENCOUNTER — Ambulatory Visit (INDEPENDENT_AMBULATORY_CARE_PROVIDER_SITE_OTHER): Payer: Medicare Other | Admitting: Surgery

## 2020-01-31 ENCOUNTER — Other Ambulatory Visit: Payer: Self-pay

## 2020-01-31 ENCOUNTER — Ambulatory Visit (HOSPITAL_COMMUNITY)
Admission: RE | Admit: 2020-01-31 | Discharge: 2020-01-31 | Disposition: A | Discharge status: Home / Self Care | Payer: Medicare Other | Source: Ambulatory Visit | Attending: Surgery | Admitting: Surgery

## 2020-01-31 VITALS — BP 126/82 | HR 62 | Temp 97.9°F | Resp 20 | Ht 70.0 in | Wt 167.0 lb

## 2020-01-31 DIAGNOSIS — M7989 Other specified soft tissue disorders: Secondary | ICD-10-CM | POA: Diagnosis not present

## 2020-01-31 DIAGNOSIS — M79674 Pain in right toe(s): Secondary | ICD-10-CM | POA: Diagnosis present

## 2020-01-31 NOTE — Progress Notes (Signed)
[facility] [place]  Patient name: Brian [name] MRN: [id number] DOB: 19-May-1941 Sex: male   REQUESTING PROVIDER:    Dr. Rhona Raider   REASON FOR CONSULT:    Toe pain  HISTORY OF PRESENT ILLNESS:   [name] is a 79 y.o. male, who is referred for evaluation of right foot and ankle swelling.  The patient had a right MTP joint replacement 15 to 20 years ago.  He developed a bony growth that became very painful.  He recently had this excised including a sesamoid bone.  He states that he began having swelling approximately a year ago and has persisted since surgery.  He does wear a croup type compression sock that does help.  He is referred for further evaluation.   The patient has a history of coronary artery disease, status post MI treated with PCI.  He is medically managed for hypertension.  He takes a statin for hypercholesterolemia.  He suffers from obstructive sleep apnea for which he uses CPAP.  He is a current smoker.  PAST MEDICAL HISTORY    Past Medical History:  Diagnosis Date  . Adenomatous polyp   . Carpal tunnel syndrome on left   . Cervical spine disease   . Coronary artery disease   . Hyperlipidemia   . Hypertension   . Low back pain   . Myocardial infarction ([place]) 1998  . Sleep apnea    CPAP  . Wears dentures    full upper     FAMILY HISTORY   Family History  Problem Relation Age of Onset  . Kidney disease Father   . Hypertension Father   . Heart failure Mother   . Coronary artery disease Brother     SOCIAL HISTORY:   Social History   Socioeconomic History  . Marital status: Married    Spouse name: [name]  . Number of children: 1  . Years of education: Not on file  . Highest education level: Not on file  Occupational History  . Occupation: retired    Comment: Chemical engineer  Tobacco Use  . Smoking status: Current Every Day Smoker    Packs/day: 1.00     Years: 60.00    Pack years: 60.00    Types: Cigarettes  . Smokeless tobacco: Never Used  . Tobacco comment: 1/2 3/4 ppd (since age 12)  Substance and Sexual Activity  . Alcohol use: Not Currently    Alcohol/week: 0.0 standard drinks    Comment: occasionally  . Drug use: No  . Sexual activity: Not on file  Other Topics Concern  . Not on file  Social History Narrative  . Not on file   Social Determinants of Health   Financial Resource Strain:   . Difficulty of Paying Living Expenses:   Food Insecurity:   . Worried About Charity fundraiser in the Last Year:   . Arboriculturist in the Last Year:   Transportation Needs:   . Film/video editor (Medical):   Marland Kitchen Lack of Transportation (Non-Medical):   Physical Activity:   . Days of Exercise per Week:   . Minutes of Exercise per Session:   Stress:   . Feeling of Stress :   Social Connections:   . Frequency of Communication with Friends and Family:   . Frequency of Social Gatherings with Friends and Family:   . Attends Religious Services:   . Active Member of Clubs or Organizations:   . Attends  Club or Organization Meetings:   Marland Kitchen Marital Status:   Intimate Partner Violence:   . Fear of Current or Ex-Partner:   . Emotionally Abused:   Marland Kitchen Physically Abused:   . Sexually Abused:     ALLERGIES:    No Known Allergies  CURRENT MEDICATIONS:    Current Outpatient Medications  Medication Sig Dispense Refill  . acetaminophen (TYLENOL) 325 MG tablet Take 325 mg by mouth daily as needed.    Marland Kitchen aspirin 81 MG tablet Take 81 mg by mouth daily.      Marland Kitchen atorvastatin (LIPITOR) 40 MG tablet Take 40 mg by mouth daily.      . ciprofloxacin (CIPRO) 500 MG tablet Take 1 tablet (500 mg total) by mouth 2 (two) times daily. 20 tablet 0  . Coenzyme Q10 (COQ10) 100 MG CAPS Take 1 capsule by mouth daily.      . metroNIDAZOLE (FLAGYL) 500 MG tablet Take 1 tablet (500 mg total) by mouth 2 (two) times daily. 20 tablet 0  . Multiple Vitamin  (MULTIVITAMIN PO) Take 1 tablet by mouth daily.    Marland Kitchen oxyCODONE-acetaminophen (PERCOCET) 10-325 MG tablet Take one to two tablets by mouth every 4 to 6 hours as needed for pain.    Marland Kitchen quinapril (ACCUPRIL) 10 MG tablet Take 10 mg by mouth daily.     No current facility-administered medications for this visit.    REVIEW OF SYSTEMS:   [X]  denotes positive finding, [ ]  denotes negative finding Cardiac  Comments:  Chest pain or chest pressure:    Shortness of breath upon exertion:    Short of breath when lying flat:    Irregular heart rhythm:        Vascular    Pain in calf, thigh, or hip brought on by ambulation:    Pain in feet at night that wakes you up from your sleep:     Blood clot in your veins:    Leg swelling:  x       Pulmonary    Oxygen at home:    Productive cough:     Wheezing:         Neurologic    Sudden weakness in arms or legs:     Sudden numbness in arms or legs:     Sudden onset of difficulty speaking or slurred speech:    Temporary loss of vision in one eye:     Problems with dizziness:         Gastrointestinal    Blood in stool:      Vomited blood:         Genitourinary    Burning when urinating:     Blood in urine:        Psychiatric    Major depression:         Hematologic    Bleeding problems:    Problems with blood clotting too easily:        Skin    Rashes or ulcers:        Constitutional    Fever or chills:     PHYSICAL EXAM:   There were no vitals filed for this visit.  GENERAL: The patient is a well-nourished male, in no acute distress. The vital signs are documented above. CARDIAC: There is a regular rate and rhythm.  VASCULAR: Edema limited to the foot and ankle.  Palpable pedal pulses. PULMONARY: Nonlabored respirationss.  MUSCULOSKELETAL: There are no major deformities or cyanosis. NEUROLOGIC: No focal weakness or paresthesias  are detected. SKIN: There are no ulcers or rashes noted. PSYCHIATRIC: The patient has a normal  affect.  STUDIES:   I have reviewed the following: +-------+-----------+-----------+------------+------------+  ABI/TBIToday's ABIToday's TBIPrevious ABIPrevious TBI  +-------+-----------+-----------+------------+------------+  Right 1.03    0.72                  +-------+-----------+-----------+------------+------------+  Left  1.13    0.78                  +-------+-----------+-----------+------------+------------+   RIGHT TOE:  107 LEFT TOE:  115  ASSESSMENT and PLAN   Right ankle swelling: I discussed with the patient that he has no evidence of arterial insufficiency.  I suspect that his swelling is all secondary to lymphatic disease given the bony growth on his foot, prior surgery, and his most recent surgery.  It is unclear whether or not this will resolve with time.  No intervention is recommended.  I discussed wearing compression socks to mitigate future complications from a chronically swollen extremity.  We discussed using 20-30 or 15-20 crew type socks.  All of his questions were answered.  He will follow-up on an as-needed basis.   Leia Alf, MD, FACS [facility] [phone] Pager [phone]

## 2020-05-28 ENCOUNTER — Other Ambulatory Visit: Payer: Self-pay | Admitting: Geriatric Medicine

## 2020-05-28 DIAGNOSIS — R918 Other nonspecific abnormal finding of lung field: Secondary | ICD-10-CM

## 2020-06-25 ENCOUNTER — Ambulatory Visit
Admission: RE | Admit: 2020-06-25 | Discharge: 2020-06-25 | Disposition: A | Discharge status: Home / Self Care | Payer: Medicare Other | Source: Ambulatory Visit | Attending: Geriatric Medicine | Admitting: Geriatric Medicine

## 2020-06-25 DIAGNOSIS — R918 Other nonspecific abnormal finding of lung field: Secondary | ICD-10-CM

## 2020-08-07 ENCOUNTER — Ambulatory Visit: Payer: Medicare Other | Attending: Internal Medicine

## 2020-08-07 DIAGNOSIS — Z23 Encounter for immunization: Secondary | ICD-10-CM

## 2020-08-07 NOTE — Progress Notes (Signed)
   Covid-19 Vaccination Clinic  Name:  Brian [name]    MRN: [id number] DOB: [birth year]  08/07/2020  [name] was observed post Covid-19 immunization for 15 minutes without incident. He was provided with Vaccine Information Sheet and instruction to access the V-Safe system.   [name] was instructed to call 911 with any severe reactions post vaccine: Marland Kitchen Difficulty breathing  . Swelling of face and throat  . A fast heartbeat  . A bad rash all over body  . Dizziness and weakness   Immunizations Administered    Name Date Dose VIS Date Route   Pfizer COVID-19 Vaccine 08/07/2020 12:51 PM 0.3 mL 05/08/2020 Intramuscular   Manufacturer: [place]   Lot: [id number]   NDC: [address]-6

## 2020-08-08 ENCOUNTER — Other Ambulatory Visit (HOSPITAL_COMMUNITY): Payer: Self-pay | Admitting: Internal Medicine

## 2020-11-25 DIAGNOSIS — F17210 Nicotine dependence, cigarettes, uncomplicated: Secondary | ICD-10-CM | POA: Diagnosis not present

## 2020-11-25 DIAGNOSIS — I712 Thoracic aortic aneurysm, without rupture: Secondary | ICD-10-CM | POA: Diagnosis not present

## 2020-11-25 DIAGNOSIS — I2510 Atherosclerotic heart disease of native coronary artery without angina pectoris: Secondary | ICD-10-CM | POA: Diagnosis not present

## 2020-11-25 DIAGNOSIS — I10 Essential (primary) hypertension: Secondary | ICD-10-CM | POA: Diagnosis not present

## 2020-11-25 DIAGNOSIS — E7800 Pure hypercholesterolemia, unspecified: Secondary | ICD-10-CM | POA: Diagnosis not present

## 2020-11-25 DIAGNOSIS — I700 Atherosclerosis of aorta: Secondary | ICD-10-CM | POA: Diagnosis not present

## 2020-11-25 DIAGNOSIS — J449 Chronic obstructive pulmonary disease, unspecified: Secondary | ICD-10-CM | POA: Diagnosis not present

## 2020-11-25 DIAGNOSIS — M5441 Lumbago with sciatica, right side: Secondary | ICD-10-CM | POA: Diagnosis not present

## 2020-11-25 DIAGNOSIS — G8929 Other chronic pain: Secondary | ICD-10-CM | POA: Diagnosis not present

## 2020-12-13 ENCOUNTER — Ambulatory Visit: Payer: Medicare Other | Admitting: Interventional Cardiology

## 2020-12-25 ENCOUNTER — Ambulatory Visit: Payer: Medicare Other | Attending: Internal Medicine

## 2020-12-25 DIAGNOSIS — Z23 Encounter for immunization: Secondary | ICD-10-CM

## 2020-12-25 NOTE — Progress Notes (Signed)
   Covid-19 Vaccination Clinic  Name:  MADDOCK FINIGAN    MRN: [id number] DOB: 03/31/1941  12/25/2020  Mr. Birchall was observed post Covid-19 immunization for 15 minutes without incident. He was provided with Vaccine Information Sheet and instruction to access the V-Safe system.   Mr. Lawry was instructed to call 911 with any severe reactions post vaccine: Marland Kitchen Difficulty breathing  . Swelling of face and throat  . A fast heartbeat  . A bad rash all over body  . Dizziness and weakness   Immunizations Administered    Name Date Dose VIS Date Route   PFIZER Comrnaty([place] TOP) Covid-19 Vaccine 12/25/2020 12:43 PM 0.3 mL 06/27/2020 Intramuscular   Manufacturer: [place]   Lot: [id number]   [place]: [phone]

## 2020-12-30 ENCOUNTER — Telehealth: Payer: Self-pay

## 2020-12-30 ENCOUNTER — Telehealth: Payer: Self-pay | Admitting: Interventional Cardiology

## 2020-12-30 NOTE — Telephone Encounter (Signed)
Called pt and moved appt up to Wednesday with Richardson Dopp, PA-C.  Pt appreciative for call.

## 2020-12-30 NOTE — Telephone Encounter (Signed)
    Pt is calling to f/u his mychart message sent last night. He said he is very concern and if someone can give him a call today

## 2020-12-30 NOTE — Telephone Encounter (Signed)
See phone note above.

## 2020-12-30 NOTE — Telephone Encounter (Signed)
error 

## 2020-12-30 NOTE — Telephone Encounter (Signed)
Spoke with the patient in regards to MyChart message: [place], [place] to Belva Crome, MD      6:49 PM Wanted to let you know that Mikki Santee experienced severe chest pains beginning around 10:30 pm Saturday evening, 12/28/2020 which did not subside until 4:30 am Sunday morning 12/29/2020.  The pain was located below and to the right of the right nipple.  The pain was under his right ribs.  There was no pain radiating down the arm.  As long as he laid on his back he was comfortable but movement caused pain.  Today, Sunday, 12/29/2020, he has had 2 twinges of pain and, after the second one, he did take a nitro pill.  Starting today, he cannot take a deep breath without experiencing pressure.   Mikki Santee was supposed to see you in January, 2022 for his 1 year follow-up but no appointments were available for him until September 26.  Mikki Santee has had no chest pains since he received the stints in 1998.  We are extremely concerned and hope to hear from you tomorrow, Monday, June 13, as soon as possible so we will know what the next step is.    Patient reports that chest pain was sharp and occurred at rest. He reports that pain on Saturday last for several hours before subsiding. He states that lying down helped some. He reports that the pain came back on Sunday. He states that yesterday he also felt chest tightness when taking a deep breath. He denies any associated symptoms such as SOB, N/V, sweating, dizziness. He states that he did take a nitroglycerin yesterday. He is unsure if it helped or not.  He is not having any current chest pain or shortness of breath. He states that he just feels a bit out of it. Patient has been advised on ER precautions. Unable to find an appointment this week for patient to be seen. Will forward to Dr. Tamala Julian and nurse for advisement.

## 2020-12-30 NOTE — Telephone Encounter (Signed)
Spoke with pt and scheduled him to see Almyra Deforest, PA-C on Friday.  No openings sooner.  Reviewed ER precautions again and advised I will be in touch if any sooner cancellations.

## 2021-01-01 ENCOUNTER — Encounter: Payer: Self-pay | Admitting: Physician Assistant

## 2021-01-01 ENCOUNTER — Ambulatory Visit (INDEPENDENT_AMBULATORY_CARE_PROVIDER_SITE_OTHER): Payer: Medicare Other | Admitting: Physician Assistant

## 2021-01-01 ENCOUNTER — Other Ambulatory Visit: Payer: Self-pay

## 2021-01-01 VITALS — BP 140/78 | HR 91 | Ht 70.0 in | Wt 163.0 lb

## 2021-01-01 DIAGNOSIS — I2510 Atherosclerotic heart disease of native coronary artery without angina pectoris: Secondary | ICD-10-CM

## 2021-01-01 DIAGNOSIS — R0789 Other chest pain: Secondary | ICD-10-CM

## 2021-01-01 DIAGNOSIS — E785 Hyperlipidemia, unspecified: Secondary | ICD-10-CM

## 2021-01-01 DIAGNOSIS — I10 Essential (primary) hypertension: Secondary | ICD-10-CM

## 2021-01-01 DIAGNOSIS — Z720 Tobacco use: Secondary | ICD-10-CM | POA: Diagnosis not present

## 2021-01-01 DIAGNOSIS — R079 Chest pain, unspecified: Secondary | ICD-10-CM | POA: Diagnosis not present

## 2021-01-01 LAB — D-DIMER, QUANTITATIVE: D-DIMER: 0.53 mg/L FEU — ABNORMAL HIGH (ref 0.00–0.49)

## 2021-01-01 LAB — TROPONIN T: Troponin T (Highly Sensitive): 12 ng/L (ref 0–22)

## 2021-01-01 MED ORDER — METOPROLOL SUCCINATE ER 25 MG PO TB24: 25.0000 mg | ORAL_TABLET | Freq: Every day | ORAL | 2 refills | Status: DC

## 2021-01-01 NOTE — Patient Instructions (Signed)
Medication Instructions:   START TOPROL one tablet by mouth ( 25 mg) daily.  *If you need a refill on your cardiac medications before your next appointment, please call your pharmacy*   Lab Work:  TODAY!!!!  CMET/CBBC/STAT TROPONIN/D DIMER If you have labs (blood work) drawn today and your tests are completely normal, you will receive your results only by: [place] (if you have MyChart) OR A paper copy in the mail If you have any lab test that is abnormal or we need to change your treatment, we will call you to review the results.   Testing/Procedures: Your physician has requested that you have a lexiscan myoview. For further information please visit [email or web address]. Please follow instruction sheet, as given.  You are scheduled for a Myocardial Perfusion Imaging Study on         at    .   Please arrive 15 minutes prior to your appointment time for registration and insurance purposes.   The test will take approximately 3 to 4 hours to complete; you may bring reading material. If someone comes with you to your appointment, they will need to remain in the main lobby due to limited space in the testing area.  How to prepare for your Myocardial Perfusion test:   Do not eat or drink 3 hours prior to your test, except you may have water.    Do not consume products containing caffeine (regular or decaffeinated) 12 hours prior to your test (ex: coffee, chocolate, soda, tea)   Do bring a list of your current medications with you. If not listed below, you may take your medications as normal.    Bring any held medication to your appointment, as you may be required to take it once the test is complete.   Do wear comfortable clothes (no dresses or overalls) and walking shoes. Tennis shoes are preferred. No heels or open toed shoes.  Do not wear cologne, aftershave or lotions (deodorant is allowed).   If these instructions are not followed, you test will have to be rescheduled.    If you cannot keep your appointment, please provide 24 hour notification to the Nuclear lab to avoid a possible $50 charge to your account.    Your physician has requested that you have an echocardiogram. Echocardiography is a painless test that uses sound waves to create images of your heart. It provides your doctor with information about the size and shape of your heart and how well your heart's chambers and valves are working. This procedure takes approximately one hour. There are no restrictions for this procedure.    Follow-Up: At [facility], you and your health needs are our priority.  As part of our continuing mission to provide you with exceptional heart care, we have created designated Provider Care Teams.  These Care Teams include your primary Cardiologist (physician) and Advanced Practice Providers (APPs -  Physician Assistants and Nurse Practitioners) who all work together to provide you with the care you need, when you need it.  We recommend signing up for the patient portal called "MyChart".  Sign up information is provided on this After Visit Summary.  MyChart is used to connect with patients for Virtual Visits (Telemedicine).  Patients are able to view lab/test results, encounter notes, upcoming appointments, etc.  Non-urgent messages can be sent to your provider as well.   To learn more about what you can do with MyChart, go to [email or web address].    Your next appointment:   5 week(s)  Tuesday, July 19 @ 3:20.   The format for your next appointment:   In Person  Provider:   Daneen Schick, MD   Other Instructions A chest x-ray takes a picture of the organs and structures inside the chest, including the heart, lungs, and blood vessels. This test can show several things, including, whether the heart is enlarges; whether fluid is building up in the lungs; and whether pacemaker / defibrillator leads are still in place.

## 2021-01-01 NOTE — Progress Notes (Signed)
Cardiology Office Note:    Date:  01/01/2021   ID:  [name], DOB [birth year], MRN [id number]  PCP:  Merlene Laughter, MD   [facility] Providers Cardiologist:  Lesleigh Noe, MD      Referring MD: Merlene Laughter, MD   Chief Complaint:  Chest Pain    Patient Profile:    Brian [name] is a 80 y.o. male with:  Coronary artery disease  S/p anterior MI tx with PCI in 1998 Hypertension  Hyperlipidemia  OSA on CPAP  +Cigs Cervical spine disease  Lumbar disc disease  Plantar fasciitis 2/2 bone spur  Prior CV studies: ABIs 01/31/20 Normal   AAA U/S 10/17/19 No AAA (2.5 cm)  ETT 04/13/14 No ischemia   History of Present Illness: Mr. Haggart was last seen by Dr. Katrinka Blazing in 08/2019.  He called in recently with symptoms of chest pain.  He is seen for further evaluation.  He is here alone today.  Three nights ago, he developed sudden onset R lower chest pain (mid-clavicular line below the nipple) that was sharp and lasted for several hours.  He took NTG but is not sure if it helped.  He had no radiating symptoms or assoc shortness of breath, nausea, diaphoresis.  He has not had exertional symptoms.  This was not really like his pain he had with his myocardial infarction.  He has had several brief episodes of chest pain over the past couple of days.  These have only lasted seconds.  He also notes pain with deep inspiration.  He has not had pain with lying supine.  He has not had orthopnea, syncope.  He has not had any recent trips, hospitalizations or leg injury.  His R leg is affected by lumbar spine issues and it is weak.  He has some swelling in that leg and wears compression stockings when his edema worsens.      Past Medical History:  Diagnosis Date   Adenomatous polyp    Carpal tunnel syndrome on left    Cervical spine disease    Coronary artery disease    Hyperlipidemia    Hypertension    Low back pain    Myocardial infarction (HCC) 1998   Sleep apnea    CPAP    Wears dentures    full upper    Current Medications: Current Meds  Medication Sig   acetaminophen (TYLENOL) 325 MG tablet Take 325 mg by mouth daily as needed.   aspirin 81 MG tablet Take 81 mg by mouth daily.     atorvastatin (LIPITOR) 40 MG tablet Take 40 mg by mouth daily.     Coenzyme Q10 (COQ10) 100 MG CAPS Take 1 capsule by mouth daily.     COVID-19 mRNA vaccine, Pfizer, 30 MCG/0.3ML injection USE AS DIRECTED   metoprolol succinate (TOPROL XL) 25 MG 24 hr tablet Take 1 tablet (25 mg total) by mouth daily.   Multiple Vitamin (MULTIVITAMIN PO) Take 1 tablet by mouth daily.   nitroGLYCERIN (NITROSTAT) 0.4 MG SL tablet Place 0.4 mg under the tongue every 5 (five) minutes as needed for chest pain.   oxyCODONE-acetaminophen (PERCOCET) 10-325 MG tablet Take one to two tablets by mouth every 4 to 6 hours as needed for pain.   quinapril (ACCUPRIL) 10 MG tablet Take 10 mg by mouth daily.     Allergies:   Patient has no known allergies.   Social History   Tobacco Use   Smoking status: Every Day  Packs/day: 0.50    Years: 60.00    Pack years: 30.00    Types: Cigarettes   Smokeless tobacco: Never   Tobacco comments:    1/2 3/4 ppd (since age 59)  Vaping Use   Vaping Use: Never used  Substance Use Topics   Alcohol use: Not Currently    Alcohol/week: 0.0 standard drinks    Comment: occasionally   Drug use: No     Family Hx: The patient's family history includes Coronary artery disease in his brother; Heart failure in his mother; Hypertension in his father; Kidney disease in his father.  Review of Systems  Constitutional: Positive for night sweats. Negative for fever and weight loss.  Cardiovascular:  Negative for palpitations.  Respiratory:  Negative for cough.   Skin:  Negative for rash.  Gastrointestinal:  Negative for diarrhea, hematochezia, melena and vomiting.  Genitourinary:  Negative for hematuria.  All other systems reviewed and are negative.   EKGs/Labs/Other  Test Reviewed:    EKG:  EKG is  ordered today.  The ekg ordered today demonstrates NSR, HR 91, LAD, ant-sept Qs, Qtc 452, non-specific ST-TW changes, similar to old EKGs.  Recent Labs: No results found for requested labs within last 8760 hours.   Recent Lipid Panel No results found for: CHOL, TRIG, HDL, LDLCALC, LDLDIRECT  Labs obtained through [facility] - personally reviewed and interpreted: 05/27/20: TC 107, HDL 43, LDL 36, Trig 175, Hgb 14.2, ALT 18 11/25/20: SCr 1.05, K+ 4.9   Risk Assessment/Calculations:      Physical Exam:    VS:  BP 140/78   Pulse 91   Ht $R'5\' 10"'eC$  (1.778 m)   Wt 163 lb (73.9 kg)   SpO2 98%   BMI 23.39 kg/m     Wt Readings from Last 3 Encounters:  01/01/21 163 lb (73.9 kg)  01/31/20 167 lb (75.8 kg)  09/14/19 168 lb 6.4 oz (76.4 kg)     Constitutional:      Appearance: Healthy appearance. Not in distress.  Eyes:     Pupils: Pupils are equal, round, and reactive to light.  Neck:     Thyroid: No thyromegaly.     Vascular: No JVR. JVD normal.  Pulmonary:     Effort: Pulmonary effort is normal.     Breath sounds: No wheezing. No rales.  Chest:     Chest wall: Not tender to palpatation.  Cardiovascular:     Normal rate. Regular rhythm. Normal S1. Normal S2.      Murmurs: There is no murmur.     No gallop.   Edema:    Pretibial: trace edema of the right pretibial area.    Ankle: trace edema of the right ankle. Abdominal:     Palpations: Abdomen is soft. There is no hepatomegaly.     Tenderness: There is no abdominal tenderness.  Skin:    General: Skin is cool and dry.     Findings: No rash.  Neurological:     General: No focal deficit present.     Mental Status: Oriented to person, place and time.         ASSESSMENT & PLAN:    1. Other chest pain Etiology of his symptoms are not entirely clear.  His pain sounds non-cardiac.  However, given his hx of coronary artery disease, prior MI and continued smoking hx, coronary ischemia is in the  differential.  His EKG does not demonstrate and acute changes. He does note a hx of night sweats.  He did have a Chest CT in 12/21 that demonstrated very small bilateral pulmonary nodules.  Given ongoing smoking hx, he is certainly at risk for malignancy which would increase his risk of pulmonary embolism.  Luckily, his O2 sats are normal, he does not have S1Q3T3 changes on EKG and he has not had syncope.  He may have an atypical presentation for pneumonia as well.  He had a 4.1 cm thoracic aneurysm on his CT from 12/21.  However, his presentation is not c/w aortic dissection.  He has no rash and therefore I do not think this is a zoster outbreak.  He has a lot of spine issues and may simply have MSK pain.  He needs further workup to rule out ischemia as well as thromboembolism.    -Obtain CMET, CBC, D-Dimer, Troponin T, CXR  -If Troponin abnormal, he will need to go to the ED for possible cardiac catheterization   -If D-Dimer elevated, he will need chest CTA to r/o PE  -Arrange Lexiscan Myoview (if Troponin neg)  -Arrange 2D-Echocardiogram.  -F/u with Dr. Tamala Julian after testing complete  2. Coronary artery disease involving native coronary artery of native heart without angina pectoris Hx of remote MI s/p PCI.  Last ETT in 03/2014 was low risk.  As noted above, will check Troponin and proceed with Myoview if neg.  Continue ASA, statin.  Add Toprol-XL 25 mg once daily.  He notes a hx of what sounds like orthostasis.  It sounds like he was having low BPs at that time.  His BP is mildly elevated and his HR is in the 90s.  He should be able tolerate low dose beta-blocker as noted.    3. Essential hypertension Start Toprol-XL as noted.  Continue quinapril.    4. Hyperlipidemia LDL goal <70 Continue high intensity statin Rx.  Recent LDL optimal.  5. Tobacco abuse As noted, recent CT did demonstrate small nodules up to 4 mm.  Plan is for repeat scan in 12/22.    Shared Decision Making/Informed  Consent The risks [chest pain, shortness of breath, cardiac arrhythmias, dizziness, blood pressure fluctuations, myocardial infarction, stroke/transient ischemic attack, nausea, vomiting, allergic reaction, radiation exposure, metallic taste sensation and life-threatening complications (estimated to be 1 in 10,000)], benefits (risk stratification, diagnosing coronary artery disease, treatment guidance) and alternatives of a nuclear stress test were discussed in detail with Mr. Archila and he agrees to proceed.    Dispo:  Return in about 5 weeks (around 02/05/2021) for Routine follow up 5 weeks with Dr.  Tamala Julian. .   Medication Adjustments/Labs and Tests Ordered: Current medicines are reviewed at length with the patient today.  Concerns regarding medicines are outlined above.  Tests Ordered: Orders Placed This Encounter  Procedures   DG Chest 2 View   Comp Met (CMET)   CBC   Troponin I   D-Dimer, Quantitative   Troponin T   Myocardial Perfusion Imaging   EKG 12-Lead   ECHOCARDIOGRAM COMPLETE   Medication Changes: Meds ordered this encounter  Medications   metoprolol succinate (TOPROL XL) 25 MG 24 hr tablet    Sig: Take 1 tablet (25 mg total) by mouth daily.    Dispense:  30 tablet    Refill:  2    Signed, Richardson Dopp, PA-C  01/01/2021 5:10 PM    [facility] [address], [place], [place]  [address] Phone: [phone]; Fax: [phone]

## 2021-01-02 ENCOUNTER — Other Ambulatory Visit: Payer: Self-pay | Admitting: Physician Assistant

## 2021-01-02 ENCOUNTER — Other Ambulatory Visit (HOSPITAL_COMMUNITY): Payer: Self-pay

## 2021-01-02 DIAGNOSIS — R0789 Other chest pain: Secondary | ICD-10-CM

## 2021-01-02 LAB — CBC
Hematocrit: 45.9 % (ref 37.5–51.0)
Hemoglobin: 15.2 g/dL (ref 13.0–17.7)
MCH: 29.5 pg (ref 26.6–33.0)
MCHC: 33.1 g/dL (ref 31.5–35.7)
MCV: 89 fL (ref 79–97)
Platelets: 262 10*3/uL (ref 150–450)
RBC: 5.15 x10E6/uL (ref 4.14–5.80)
RDW: 12.5 % (ref 11.6–15.4)
WBC: 9.1 10*3/uL (ref 3.4–10.8)

## 2021-01-02 LAB — COMPREHENSIVE METABOLIC PANEL
ALT: 17 IU/L (ref 0–44)
AST: 19 IU/L (ref 0–40)
Albumin/Globulin Ratio: 1.4 (ref 1.2–2.2)
Albumin: 4.4 g/dL (ref 3.7–4.7)
Alkaline Phosphatase: 100 IU/L (ref 44–121)
BUN/Creatinine Ratio: 12 (ref 10–24)
BUN: 13 mg/dL (ref 8–27)
Bilirubin Total: 0.5 mg/dL (ref 0.0–1.2)
CO2: 20 mmol/L (ref 20–29)
Calcium: 9.7 mg/dL (ref 8.6–10.2)
Chloride: 103 mmol/L (ref 96–106)
Creatinine, Ser: 1.05 mg/dL (ref 0.76–1.27)
Globulin, Total: 3.1 g/dL (ref 1.5–4.5)
Glucose: 107 mg/dL — ABNORMAL HIGH (ref 65–99)
Potassium: 4.5 mmol/L (ref 3.5–5.2)
Sodium: 139 mmol/L (ref 134–144)
Total Protein: 7.5 g/dL (ref 6.0–8.5)
eGFR: 72 mL/min/{1.73_m2} (ref >59)

## 2021-01-02 MED ORDER — COVID-19 MRNA VAC-TRIS(PFIZER) 30 MCG/0.3ML IM SUSP
INTRAMUSCULAR | 0 refills | Status: DC
  Filled 2021-01-02: qty 0.3, 1d supply, fill #0

## 2021-01-02 NOTE — Progress Notes (Signed)
Pt has been made aware of normal result and verbalized understanding.  jw

## 2021-01-03 ENCOUNTER — Ambulatory Visit: Payer: Medicare Other | Admitting: Physician Assistant

## 2021-01-03 ENCOUNTER — Other Ambulatory Visit (HOSPITAL_COMMUNITY): Payer: Self-pay

## 2021-01-03 NOTE — Telephone Encounter (Signed)
Have him hold the quinapril and take the Toprol at least until he has his stress test and echocardiogram done.  If those tests are ok, he can switch back to the quinapril then. Richardson Dopp, PA-C    01/03/2021 12:35 PM

## 2021-01-03 NOTE — Telephone Encounter (Signed)
Call placed to pt. He has been made aware to take the Metoprolol and hold the Quinapril, at least until his heart tests come back. He said he would give it a try.

## 2021-01-06 DIAGNOSIS — L280 Lichen simplex chronicus: Secondary | ICD-10-CM | POA: Diagnosis not present

## 2021-01-06 DIAGNOSIS — L720 Epidermal cyst: Secondary | ICD-10-CM | POA: Diagnosis not present

## 2021-01-07 ENCOUNTER — Ambulatory Visit
Admission: RE | Admit: 2021-01-07 | Discharge: 2021-01-07 | Disposition: A | Discharge status: Home / Self Care | Payer: Medicare Other | Source: Ambulatory Visit | Attending: Physician Assistant | Admitting: Physician Assistant

## 2021-01-07 ENCOUNTER — Encounter (HOSPITAL_BASED_OUTPATIENT_CLINIC_OR_DEPARTMENT_OTHER)
Admission: RE | Admit: 2021-01-07 | Discharge: 2021-01-07 | Disposition: A | Discharge status: Home / Self Care | Payer: Medicare Other | Source: Ambulatory Visit | Attending: Physician Assistant | Admitting: Physician Assistant

## 2021-01-07 ENCOUNTER — Other Ambulatory Visit: Payer: Self-pay

## 2021-01-07 DIAGNOSIS — R079 Chest pain, unspecified: Secondary | ICD-10-CM | POA: Diagnosis not present

## 2021-01-07 DIAGNOSIS — R0789 Other chest pain: Secondary | ICD-10-CM | POA: Insufficient documentation

## 2021-01-07 DIAGNOSIS — J439 Emphysema, unspecified: Secondary | ICD-10-CM | POA: Diagnosis not present

## 2021-01-07 LAB — NM MYOCAR MULTI W/SPECT W/WALL MOTION / EF
Estimated workload: 1 METS
Exercise duration (min): 0 min
Exercise duration (sec): 0 s
LV dias vol: 110 mL (ref 62–150)
LV sys vol: 58 mL
MPHR: 140 {beats}/min
Peak HR: 83 {beats}/min
Percent HR: 59 %
Rest HR: 57 {beats}/min
TID: 0.97

## 2021-01-07 MED ORDER — REGADENOSON 0.4 MG/5ML IV SOLN
0.4000 mg | Freq: Once | INTRAVENOUS | Status: AC
  Administered 2021-01-07: 0.4 mg via INTRAVENOUS
  Filled 2021-01-07: qty 5

## 2021-01-07 MED ORDER — TECHNETIUM TC 99M TETROFOSMIN IV KIT
30.0000 | PACK | Freq: Once | INTRAVENOUS | Status: AC | PRN
  Administered 2021-01-07: 31.6 via INTRAVENOUS

## 2021-01-07 MED ORDER — TECHNETIUM TC 99M TETROFOSMIN IV KIT
10.0000 | PACK | Freq: Once | INTRAVENOUS | Status: AC | PRN
  Administered 2021-01-07: 10.24 via INTRAVENOUS

## 2021-01-08 ENCOUNTER — Encounter: Payer: Self-pay | Admitting: Physician Assistant

## 2021-01-09 ENCOUNTER — Other Ambulatory Visit (HOSPITAL_COMMUNITY): Payer: Self-pay

## 2021-01-17 ENCOUNTER — Ambulatory Visit
Admission: RE | Admit: 2021-01-17 | Discharge: 2021-01-17 | Disposition: A | Discharge status: Home / Self Care | Payer: Medicare Other | Source: Ambulatory Visit | Attending: Physician Assistant | Admitting: Physician Assistant

## 2021-01-17 ENCOUNTER — Other Ambulatory Visit: Payer: Self-pay

## 2021-01-17 DIAGNOSIS — I081 Rheumatic disorders of both mitral and tricuspid valves: Secondary | ICD-10-CM | POA: Insufficient documentation

## 2021-01-17 DIAGNOSIS — I2510 Atherosclerotic heart disease of native coronary artery without angina pectoris: Secondary | ICD-10-CM | POA: Diagnosis not present

## 2021-01-17 DIAGNOSIS — I110 Hypertensive heart disease with heart failure: Secondary | ICD-10-CM | POA: Insufficient documentation

## 2021-01-17 DIAGNOSIS — I252 Old myocardial infarction: Secondary | ICD-10-CM | POA: Diagnosis not present

## 2021-01-17 DIAGNOSIS — R0789 Other chest pain: Secondary | ICD-10-CM | POA: Diagnosis not present

## 2021-01-17 LAB — ECHOCARDIOGRAM COMPLETE
AR max vel: 5.04 cm2
AV Area VTI: 4.74 cm2
AV Area mean vel: 4.41 cm2
AV Mean grad: 1.5 mmHg
AV Peak grad: 2.5 mmHg
Ao pk vel: 0.8 m/s
Area-P 1/2: 4.24 cm2
Calc EF: 53 %
S' Lateral: 3.47 cm
Single Plane A2C EF: 57.3 %
Single Plane A4C EF: 45.2 %

## 2021-01-17 MED ORDER — PERFLUTREN LIPID MICROSPHERE
1.0000 mL | INTRAVENOUS | Status: AC | PRN
  Administered 2021-01-17: 2 mL via INTRAVENOUS
  Filled 2021-01-17: qty 10

## 2021-01-17 NOTE — Progress Notes (Signed)
*  PRELIMINARY RESULTS* Echocardiogram 2D Echocardiogram has been performed.  Brian [name] 01/17/2021, 11:40 AM

## 2021-01-19 ENCOUNTER — Encounter: Payer: Self-pay | Admitting: Physician Assistant

## 2021-02-03 NOTE — Progress Notes (Signed)
Cardiology Office Note:    Date:  02/04/2021   ID:  Brian [name], DOB 03/02/1941, MRN [id number]  PCP:  Lajean Manes, MD  Cardiologist:  Sinclair Grooms, MD   Referring MD: Lajean Manes, MD   Chief Complaint  Patient presents with   Coronary Artery Disease   Chest Pain     History of Present Illness:    Brian [name] is a 80 y.o. male with a hx of  CAD, previous anterior myocardial infarction treated with angioplasty, hypertension, hyperlipidemia, OSA on CPAP and cigarette smoking    Brian [name] had significant right upper quadrant/lower chest stabbing like discomfort in June that lasted off and on over a 3-day timeframe.  He was subsequently seen by Richardson Dopp for evaluation and a nuclear study was performed revealing evidence of an old anteroseptal scar which was his first presentation with an MI in 1998.  Echocardiography also revealed an EF in the 45 to 50% range.  Since he had the episode of discomfort in June, he has had no recurrence of pain. He does have difficulty ambulating because of right lower extremity weakness and atrophy from L3-L5 disc rupture.   Past Medical History:  Diagnosis Date   Adenomatous polyp    Carpal tunnel syndrome on left    Cervical spine disease    Coronary artery disease    S/p anterior MI tx with PCI in 1998  //  Myoview 6/22: EF 45, ant and septal scar; no ischemia; intermediate risk // Echocardiogram 7/22: EF 45-50, ant-sept and apical HK, Gr 1 DD, normal RVSF, mild MR   Hyperlipidemia    Hypertension    Low back pain    Myocardial infarction ([place]) 1998   Sleep apnea    CPAP   Wears dentures    full upper    Past Surgical History:  Procedure Laterality Date   APPENDECTOMY     bunions     CATARACT EXTRACTION     COLONOSCOPY WITH PROPOFOL N/A 02/19/2015   Procedure: COLONOSCOPY WITH PROPOFOL;  Surgeon: Lucilla Lame, MD;  Location: [facility];  Service: Endoscopy;  Laterality: N/A;   COLONOSCOPY WITH PROPOFOL N/A  06/05/2019   Procedure: COLONOSCOPY WITH BIOPSY;  Surgeon: Lucilla Lame, MD;  Location: [place];  Service: Endoscopy;  Laterality: N/A;  sleep apnea   HEMORRHOID BANDING     HERNIA REPAIR     lumbar disc protrussion  03/2010   POLYPECTOMY N/A 06/05/2019   Procedure: POLYPECTOMY;  Surgeon: Lucilla Lame, MD;  Location: [facility] [place];  Service: Endoscopy;  Laterality: N/A;    Current Medications: Current Meds  Medication Sig   acetaminophen (TYLENOL) 325 MG tablet Take 325 mg by mouth daily as needed.   aspirin 81 MG tablet Take 81 mg by mouth daily.     atorvastatin (LIPITOR) 40 MG tablet Take 40 mg by mouth daily.     Coenzyme Q10 (COQ10) 100 MG CAPS Take 1 capsule by mouth daily.     COVID-19 mRNA Vac-TriS, Pfizer, SUSP injection Inject into the muscle.   COVID-19 mRNA vaccine, Pfizer, 30 MCG/0.3ML injection USE AS DIRECTED   metoprolol succinate (TOPROL XL) 25 MG 24 hr tablet Take 1 tablet (25 mg total) by mouth daily.   Multiple Vitamin (MULTIVITAMIN PO) Take 1 tablet by mouth daily.   nitroGLYCERIN (NITROSTAT) 0.4 MG SL tablet Place 0.4 mg under the tongue every 5 (five) minutes as needed for chest pain.   oxyCODONE-acetaminophen (PERCOCET) 10-325 MG tablet  Take one to two tablets by mouth every 4 to 6 hours as needed for pain.   quinapril (ACCUPRIL) 10 MG tablet Take 10 mg by mouth daily.     Allergies:   Patient has no known allergies.   Social History   Socioeconomic History   Marital status: Married    Spouse name: [name]   Number of children: 1   Years of education: Not on file   Highest education level: Not on file  Occupational History   Occupation: retired    Comment: Chemical engineer  Tobacco Use   Smoking status: Every Day    Packs/day: 0.50    Years: 60.00    Pack years: 30.00    Types: Cigarettes   Smokeless tobacco: Never   Tobacco comments:    1/2 3/4 ppd (since age 65)  57 Use   Vaping Use: Never used   Substance and Sexual Activity   Alcohol use: Not Currently    Alcohol/week: 0.0 standard drinks    Comment: occasionally   Drug use: No   Sexual activity: Not on file  Other Topics Concern   Not on file  Social History Narrative   Not on file   Social Determinants of Health   Financial Resource Strain: Not on file  Food Insecurity: Not on file  Transportation Needs: Not on file  Physical Activity: Not on file  Stress: Not on file  Social Connections: Not on file     Family History: The patient's family history includes Coronary artery disease in his brother; Heart failure in his mother; Hypertension in his father; Kidney disease in his father.  ROS:   Please see the history of present illness.    Noted with lumbar disc rupture and some atrophy in the right lower extremity.  Difficulty walking.  Stays active however because he takes care of both his wife and his dog.  All other systems reviewed and are negative.  EKGs/Labs/Other Studies Reviewed:    The following studies were reviewed today: A CT scan done in 2017 did not reveal any evidence of gallstones or sludge.  Nuclear stress test done within the past 6 weeks did not reveal any significant area of ischemia. Has had prior bilateral right lower extremity ABI and Doppler studies with mild reduction in both.  EKG:  EKG not repeated on today's exam  Recent Labs: 01/01/2021: ALT 17; BUN 13; Creatinine, Ser 1.05; Hemoglobin 15.2; Platelets 262; Potassium 4.5; Sodium 139  Recent Lipid Panel No results found for: CHOL, TRIG, HDL, CHOLHDL, VLDL, LDLCALC, LDLDIRECT  Physical Exam:    VS:  BP 118/72   Pulse 67   Ht 5\' 10"  (1.778 m)   Wt 165 lb 9.6 oz (75.1 kg)   SpO2 96%   BMI 23.76 kg/m     Wt Readings from Last 3 Encounters:  02/04/21 165 lb 9.6 oz (75.1 kg)  01/01/21 163 lb (73.9 kg)  01/31/20 167 lb (75.8 kg)     GEN: Appears younger than stated age. No acute distress HEENT: Normal NECK: No JVD. LYMPHATICS:  No lymphadenopathy CARDIAC: 1/6 right upper sternal systolic murmur. RRR S4 gallop, or edema. VASCULAR: No pulses are palpable in the right foot.  The foot is normal temperature.  Normal Pulses. No bruits. RESPIRATORY:  Clear to auscultation without rales, wheezing or rhonchi  ABDOMEN: Soft, non-tender, non-distended, No pulsatile mass, MUSCULOSKELETAL: No deformity  SKIN: Warm and dry NEUROLOGIC:  Alert and oriented x 3 PSYCHIATRIC:  Normal affect  ASSESSMENT:    1. Other chest pain   2. Coronary artery disease involving native coronary artery of native heart without angina pectoris   3. Essential hypertension   4. Hyperlipidemia LDL goal <70   5. Tobacco abuse   6. Sleep apnea, unspecified type    PLAN:    In order of problems listed above:  Low to intermediate risk nuclear study demonstrating evidence of old anterior wall scar.  No significant ischemia was noted.  Symptoms dissipated after 3 days.  Plan clinical observation. Secondary prevention discussed Blood pressure is under excellent control on current therapy which includes metoprolol and Accupril. Continue Lipitor 40 mg/day Does not smoke cigarettes anymore He wears CPAP.  Clinical observation with follow-up in 6 to 9 months.  If recurrent right upper quadrant discomfort, will need a gallbladder ultrasound.   Medication Adjustments/Labs and Tests Ordered: Current medicines are reviewed at length with the patient today.  Concerns regarding medicines are outlined above.  No orders of the defined types were placed in this encounter.  No orders of the defined types were placed in this encounter.   Patient Instructions  Medication Instructions:  Your physician recommends that you continue on your current medications as directed. Please refer to the Current Medication list given to you today.  *If you need a refill on your cardiac medications before your next appointment, please call your pharmacy*   Lab  Work: None If you have labs (blood work) drawn today and your tests are completely normal, you will receive your results only by: [place] (if you have MyChart) OR A paper copy in the mail If you have any lab test that is abnormal or we need to change your treatment, we will call you to review the results.   Testing/Procedures: None   Follow-Up: At [facility], you and your health needs are our priority.  As part of our continuing mission to provide you with exceptional heart care, we have created designated Provider Care Teams.  These Care Teams include your primary Cardiologist (physician) and Advanced Practice Providers (APPs -  Physician Assistants and Nurse Practitioners) who all work together to provide you with the care you need, when you need it.  We recommend signing up for the patient portal called "MyChart".  Sign up information is provided on this After Visit Summary.  MyChart is used to connect with patients for Virtual Visits (Telemedicine).  Patients are able to view lab/test results, encounter notes, upcoming appointments, etc.  Non-urgent messages can be sent to your provider as well.   To learn more about what you can do with MyChart, go to [email or web address].    Your next appointment:   6-9 month(s)  The format for your next appointment:   In Person  Provider:   You may see Sinclair Grooms, MD or one of the following Advanced Practice Providers on your designated Care Team:   Cecilie Kicks, NP    Other Instructions     Signed, Sinclair Grooms, MD  02/04/2021 4:33 PM    [place]

## 2021-02-04 ENCOUNTER — Other Ambulatory Visit: Payer: Self-pay

## 2021-02-04 ENCOUNTER — Encounter: Payer: Self-pay | Admitting: Interventional Cardiology

## 2021-02-04 ENCOUNTER — Ambulatory Visit: Payer: Medicare Other | Admitting: Interventional Cardiology

## 2021-02-04 VITALS — BP 118/72 | HR 67 | Ht 70.0 in | Wt 165.6 lb

## 2021-02-04 DIAGNOSIS — G4730 Sleep apnea, unspecified: Secondary | ICD-10-CM

## 2021-02-04 DIAGNOSIS — E785 Hyperlipidemia, unspecified: Secondary | ICD-10-CM

## 2021-02-04 DIAGNOSIS — I2510 Atherosclerotic heart disease of native coronary artery without angina pectoris: Secondary | ICD-10-CM | POA: Diagnosis not present

## 2021-02-04 DIAGNOSIS — R0789 Other chest pain: Secondary | ICD-10-CM

## 2021-02-04 DIAGNOSIS — Z720 Tobacco use: Secondary | ICD-10-CM

## 2021-02-04 DIAGNOSIS — I10 Essential (primary) hypertension: Secondary | ICD-10-CM

## 2021-02-04 NOTE — Patient Instructions (Signed)
Medication Instructions:  Your physician recommends that you continue on your current medications as directed. Please refer to the Current Medication list given to you today.  *If you need a refill on your cardiac medications before your next appointment, please call your pharmacy*   Lab Work: None If you have labs (blood work) drawn today and your tests are completely normal, you will receive your results only by: [place] (if you have MyChart) OR A paper copy in the mail If you have any lab test that is abnormal or we need to change your treatment, we will call you to review the results.   Testing/Procedures: None   Follow-Up: At [facility], you and your health needs are our priority.  As part of our continuing mission to provide you with exceptional heart care, we have created designated Provider Care Teams.  These Care Teams include your primary Cardiologist (physician) and Advanced Practice Providers (APPs -  Physician Assistants and Nurse Practitioners) who all work together to provide you with the care you need, when you need it.  We recommend signing up for the patient portal called "MyChart".  Sign up information is provided on this After Visit Summary.  MyChart is used to connect with patients for Virtual Visits (Telemedicine).  Patients are able to view lab/test results, encounter notes, upcoming appointments, etc.  Non-urgent messages can be sent to your provider as well.   To learn more about what you can do with MyChart, go to [email or web address].    Your next appointment:   6-9 month(s)  The format for your next appointment:   In Person  Provider:   You may see Sinclair Grooms, MD or one of the following Advanced Practice Providers on your designated Care Team:   Cecilie Kicks, NP    Other Instructions

## 2021-03-30 ENCOUNTER — Other Ambulatory Visit: Payer: Self-pay | Admitting: Physician Assistant

## 2021-04-14 ENCOUNTER — Ambulatory Visit: Payer: Medicare Other | Admitting: Interventional Cardiology

## 2021-04-25 ENCOUNTER — Other Ambulatory Visit: Payer: Self-pay

## 2021-04-25 ENCOUNTER — Ambulatory Visit: Payer: Medicare Other | Attending: Internal Medicine

## 2021-04-25 DIAGNOSIS — Z23 Encounter for immunization: Secondary | ICD-10-CM

## 2021-04-25 MED ORDER — PFIZER COVID-19 VAC BIVALENT 30 MCG/0.3ML IM SUSP
INTRAMUSCULAR | 0 refills | Status: DC
  Filled 2021-04-25: qty 0.3, 1d supply, fill #0

## 2021-04-25 MED ORDER — FLUAD QUADRIVALENT 0.5 ML IM PRSY
PREFILLED_SYRINGE | INTRAMUSCULAR | 0 refills | Status: DC
  Filled 2021-04-25: qty 0.5, 1d supply, fill #0

## 2021-04-25 NOTE — Progress Notes (Signed)
   Covid-19 Vaccination Clinic  Name:  ROGELIO WAYNICK    MRN: [id number] DOB: [birth year]  04/25/2021  Mr. Salahuddin was observed post Covid-19 immunization for 15 minutes without incident. He was provided with Vaccine Information Sheet and instruction to access the V-Safe system.   Mr. Paprocki was instructed to call 911 with any severe reactions post vaccine: Difficulty breathing  Swelling of face and throat  A fast heartbeat  A bad rash all over body  Dizziness and weakness   Lu Duffel, PharmD, MBA Clinical Acute Care Pharmacist

## 2021-05-07 DIAGNOSIS — L821 Other seborrheic keratosis: Secondary | ICD-10-CM | POA: Diagnosis not present

## 2021-05-07 DIAGNOSIS — Z85828 Personal history of other malignant neoplasm of skin: Secondary | ICD-10-CM | POA: Diagnosis not present

## 2021-05-07 DIAGNOSIS — L308 Other specified dermatitis: Secondary | ICD-10-CM | POA: Diagnosis not present

## 2021-05-07 DIAGNOSIS — L812 Freckles: Secondary | ICD-10-CM | POA: Diagnosis not present

## 2021-05-07 DIAGNOSIS — D225 Melanocytic nevi of trunk: Secondary | ICD-10-CM | POA: Diagnosis not present

## 2021-06-03 DIAGNOSIS — I712 Thoracic aortic aneurysm, without rupture, unspecified: Secondary | ICD-10-CM | POA: Diagnosis not present

## 2021-06-03 DIAGNOSIS — Z79899 Other long term (current) drug therapy: Secondary | ICD-10-CM | POA: Diagnosis not present

## 2021-06-03 DIAGNOSIS — R911 Solitary pulmonary nodule: Secondary | ICD-10-CM | POA: Diagnosis not present

## 2021-06-03 DIAGNOSIS — Z1389 Encounter for screening for other disorder: Secondary | ICD-10-CM | POA: Diagnosis not present

## 2021-06-03 DIAGNOSIS — Z0000 Encounter for general adult medical examination without abnormal findings: Secondary | ICD-10-CM | POA: Diagnosis not present

## 2021-06-03 DIAGNOSIS — I10 Essential (primary) hypertension: Secondary | ICD-10-CM | POA: Diagnosis not present

## 2021-06-03 DIAGNOSIS — F17210 Nicotine dependence, cigarettes, uncomplicated: Secondary | ICD-10-CM | POA: Diagnosis not present

## 2021-06-03 DIAGNOSIS — E7800 Pure hypercholesterolemia, unspecified: Secondary | ICD-10-CM | POA: Diagnosis not present

## 2021-06-05 ENCOUNTER — Other Ambulatory Visit: Payer: Self-pay | Admitting: Geriatric Medicine

## 2021-06-05 DIAGNOSIS — R911 Solitary pulmonary nodule: Secondary | ICD-10-CM

## 2021-06-05 DIAGNOSIS — I712 Thoracic aortic aneurysm, without rupture, unspecified: Secondary | ICD-10-CM

## 2021-06-27 ENCOUNTER — Ambulatory Visit
Admission: RE | Admit: 2021-06-27 | Discharge: 2021-06-27 | Disposition: A | Discharge status: Home / Self Care | Payer: Medicare Other | Source: Ambulatory Visit | Attending: Geriatric Medicine | Admitting: Geriatric Medicine

## 2021-06-27 DIAGNOSIS — R911 Solitary pulmonary nodule: Secondary | ICD-10-CM | POA: Diagnosis not present

## 2021-06-27 DIAGNOSIS — I712 Thoracic aortic aneurysm, without rupture, unspecified: Secondary | ICD-10-CM | POA: Diagnosis not present

## 2021-06-27 DIAGNOSIS — R918 Other nonspecific abnormal finding of lung field: Secondary | ICD-10-CM | POA: Diagnosis not present

## 2021-06-27 DIAGNOSIS — J439 Emphysema, unspecified: Secondary | ICD-10-CM | POA: Diagnosis not present

## 2021-06-27 DIAGNOSIS — I700 Atherosclerosis of aorta: Secondary | ICD-10-CM | POA: Diagnosis not present

## 2021-06-27 DIAGNOSIS — I7121 Aneurysm of the ascending aorta, without rupture: Secondary | ICD-10-CM | POA: Diagnosis not present

## 2021-07-29 DIAGNOSIS — Z03818 Encounter for observation for suspected exposure to other biological agents ruled out: Secondary | ICD-10-CM | POA: Diagnosis not present

## 2021-07-29 DIAGNOSIS — R051 Acute cough: Secondary | ICD-10-CM | POA: Diagnosis not present

## 2021-08-05 NOTE — Progress Notes (Signed)
Cardiology Office Note:    Date:  08/06/2021   ID:  Brian [name], DOB 06/24/1941, MRN [id number]  PCP:  Lajean Manes, MD  Cardiologist:  Sinclair Grooms, MD   Referring MD: Lajean Manes, MD   Chief Complaint  Patient presents with   Coronary Artery Disease   Hyperlipidemia    History of Present Illness:    Brian [name] is a 81 y.o. male with a hx of CAD, previous anterior myocardial infarction treated with angioplasty, hypertension, hyperlipidemia, OSA on CPAP and cigarette smoking.  Denies shortness of breath, orthopnea, PND, angina, dyspnea on exertion, palpitations, and syncope.  No claudication.  Has noticed a large vein running across his right kneecap.  This vein is not red or painful.  Past Medical History:  Diagnosis Date   Adenomatous polyp    Carpal tunnel syndrome on left    Cervical spine disease    Coronary artery disease    S/p anterior MI tx with PCI in 1998  //  Myoview 6/22: EF 45, ant and septal scar; no ischemia; intermediate risk // Echocardiogram 7/22: EF 45-50, ant-sept and apical HK, Gr 1 DD, normal RVSF, mild MR   Hyperlipidemia    Hypertension    Low back pain    Myocardial infarction ([place]) 1998   Sleep apnea    CPAP   Wears dentures    full upper    Past Surgical History:  Procedure Laterality Date   APPENDECTOMY     bunions     CATARACT EXTRACTION     COLONOSCOPY WITH PROPOFOL N/A 02/19/2015   Procedure: COLONOSCOPY WITH PROPOFOL;  Surgeon: Lucilla Lame, MD;  Location: [facility] ENDOSCOPY;  Service: Endoscopy;  Laterality: N/A;   COLONOSCOPY WITH PROPOFOL N/A 06/05/2019   Procedure: COLONOSCOPY WITH BIOPSY;  Surgeon: Lucilla Lame, MD;  Location: [place];  Service: Endoscopy;  Laterality: N/A;  sleep apnea   HEMORRHOID BANDING     HERNIA REPAIR     lumbar disc protrussion  03/2010   POLYPECTOMY N/A 06/05/2019   Procedure: POLYPECTOMY;  Surgeon: Lucilla Lame, MD;  Location: [place];  Service: Endoscopy;   Laterality: N/A;    Current Medications: Current Meds  Medication Sig   acetaminophen (TYLENOL) 325 MG tablet Take 325 mg by mouth daily as needed.   aspirin 81 MG tablet Take 81 mg by mouth daily.     atorvastatin (LIPITOR) 40 MG tablet Take 40 mg by mouth daily.     Coenzyme Q10 (COQ10) 100 MG CAPS Take 1 capsule by mouth daily.     COVID-19 mRNA vaccine, Pfizer, 30 MCG/0.3ML injection USE AS DIRECTED   influenza vaccine adjuvanted (FLUAD QUADRIVALENT) 0.5 ML injection Inject into the muscle.   metoprolol succinate (TOPROL-XL) 25 MG 24 hr tablet TAKE 1 TABLET(25 MG) BY MOUTH DAILY   Multiple Vitamin (MULTIVITAMIN PO) Take 1 tablet by mouth daily.   nitroGLYCERIN (NITROSTAT) 0.4 MG SL tablet Place 0.4 mg under the tongue every 5 (five) minutes as needed for chest pain.   oxyCODONE-acetaminophen (PERCOCET) 10-325 MG tablet Take one to two tablets by mouth every 4 to 6 hours as needed for pain.   quinapril (ACCUPRIL) 10 MG tablet Take 10 mg by mouth daily.     Allergies:   Patient has no known allergies.   Social History   Socioeconomic History   Marital status: Married    Spouse name: [name]   Number of children: 1   Years of education: Not on  file   Highest education level: Not on file  Occupational History   Occupation: retired    Comment: Chemical engineer  Tobacco Use   Smoking status: Every Day    Packs/day: 0.50    Years: 60.00    Pack years: 30.00    Types: Cigarettes   Smokeless tobacco: Never   Tobacco comments:    1/2 3/4 ppd (since age 68)  33 Use   Vaping Use: Never used  Substance and Sexual Activity   Alcohol use: Not Currently    Alcohol/week: 0.0 standard drinks    Comment: occasionally   Drug use: No   Sexual activity: Not on file  Other Topics Concern   Not on file  Social History Narrative   Not on file   Social Determinants of Health   Financial Resource Strain: Not on file  Food Insecurity: Not on file   Transportation Needs: Not on file  Physical Activity: Not on file  Stress: Not on file  Social Connections: Not on file     Family History: The patient's family history includes Coronary artery disease in his brother; Heart failure in his mother; Hypertension in his father; Kidney disease in his father.  ROS:   Please see the history of present illness.    Increasing difficulty with his back.  Sleeps okay.  Has to take care of his wife and a 57 year old dog.  This keeps him active.  Back is not allowing activity such as exercise as.  All other systems reviewed and are negative.  EKGs/Labs/Other Studies Reviewed:    The following studies were reviewed today:  01/07/2021 Nuclear medicine myocardial perfusion study: Study Result  Narrative & Impression  Abnormal pharmacologic myocardial perfusion stress test. There is a large in size, severe, fixed defect involving the anterior and septal walls as well as the apex consistent with scar. There is no significant ischemia. The left ventricular ejection fraction is moderately decreased (~45%) with anterior/septal hypokinesis. Coronary artery calcification versus prior LAD stent is noted on the attenuation correction CT. Aortic atherosclerosis is present. This is an intermediate risk study.     2D Doppler echocardiogram performed July 2022: IMPRESSIONS    1. Left ventricular ejection fraction, by estimation, is 45 to 50%. The  left ventricle has mildly decreased function. The left ventricle  demonstrates regional wall motion abnormalities (see scoring  diagram/findings for description). Left ventricular  diastolic parameters are consistent with Grade I diastolic dysfunction  (impaired relaxation). There is moderate hypokinesis of the left  ventricular, mid-apical anteroseptal wall and apical segment.   2. Right ventricular systolic function is normal. The right ventricular  size is normal. Tricuspid regurgitation signal is inadequate  for assessing  PA pressure.   3. The mitral valve is normal in structure. Mild mitral valve  regurgitation. No evidence of mitral stenosis.   4. The aortic valve is normal in structure. Aortic valve regurgitation is  not visualized. No aortic stenosis is present.   5. The inferior vena cava is normal in size with greater than 50%  respiratory variability, suggesting right atrial pressure of 3 mmHg.   Abdominal aortic ultrasound/Doppler 2018: Abdominal Aorta: There is evidence of abnormal dilitation of the Proximal  Abdominal aorta. The largest aortic measurement is 2.7 cm. The largest  aortic diameter remains essentially unchanged compared to prior exam.  Previous diameter measurement was 2.4  cm obtained on 10/16.   Abdominal aortic duplex 2021: Summary:  Abdominal Aorta: No evidence of an abdominal  aortic aneurysm was  visualized. The largest aortic measurement is 2.5 cm. Unable to duplicate  proximal aortic dimensions from prior exam (2.7 cm); however, there is  abnormal tapering evident in the abdominal  aorta with the largest diameter noted of the mid segment. The mid segment  is ectatic  Lower extremity arterial Doppler 2021: Summary:  Right: Resting right ankle-brachial index is within normal range. No  evidence of significant right lower extremity arterial disease. The right  toe-brachial index is normal.   Left: Resting left ankle-brachial index is within normal range. No  evidence of significant left lower extremity arterial disease. The left  toe-brachial index is normal.   Physical Exam:    VS:  BP 122/70    Pulse 75    Ht 5\' 10"  (1.778 m)    Wt 167 lb (75.8 kg)    SpO2 95%    BMI 23.96 kg/m     Wt Readings from Last 3 Encounters:  08/06/21 167 lb (75.8 kg)  02/04/21 165 lb 9.6 oz (75.1 kg)  01/01/21 163 lb (73.9 kg)     GEN: Slender but healthy-appearing. No acute distress HEENT: Normal NECK: No JVD. LYMPHATICS: No lymphadenopathy CARDIAC: No murmur. RRR  no gallop, or edema. VASCULAR:  Normal Pulses. No bruits. RESPIRATORY:  Clear to auscultation without rales, wheezing or rhonchi  ABDOMEN: Soft, non-tender, non-distended, No pulsatile mass, MUSCULOSKELETAL: No deformity  SKIN: Warm and dry NEUROLOGIC:  Alert and oriented x 3 PSYCHIATRIC:  Normal affect   ASSESSMENT:    1. Coronary artery disease involving native coronary artery of native heart without angina pectoris   2. Chronic combined systolic and diastolic heart failure ([place])   3. Essential hypertension   4. Hyperlipidemia LDL goal <70   5. Tobacco abuse   6. Infrarenal abdominal aortic aneurysm (AAA) without rupture   7. Sleep apnea, unspecified type    PLAN:    In order of problems listed above:  Secondary prevention reviewed as noted below. No dyspnea or evidence of volume overload.  EF is stable over multiple years.  Continue low-dose metoprolol succinate and Accupril.  Consider SGLT2 therapy if develops any shortness of breath or limiting symptoms. Excellent blood pressure control on above therapy. Continue high intensity Lipitor.  Most recent LDL was 44. No longer smoking. Does not have an aneurysm as noted above.  No need to continue screening. Encouraged to follow-up prescribed therapy.   Overall education and awareness concerning secondary risk prevention was discussed in detail: LDL less than 70, hemoglobin A1c less than 7, blood pressure target less than 130/80 mmHg, >150 minutes of moderate aerobic activity per week, avoidance of smoking, weight control (via diet and exercise), and continued surveillance/management of/for obstructive sleep apnea.    Medication Adjustments/Labs and Tests Ordered: Current medicines are reviewed at length with the patient today.  Concerns regarding medicines are outlined above.  No orders of the defined types were placed in this encounter.  No orders of the defined types were placed in this encounter.   Patient Instructions   Medication Instructions:  Your physician recommends that you continue on your current medications as directed. Please refer to the Current Medication list given to you today.  *If you need a refill on your cardiac medications before your next appointment, please call your pharmacy*   Lab Work: none If you have labs (blood work) drawn today and your tests are completely normal, you will receive your results only by: [place] (if you have MyChart)  OR A paper copy in the mail If you have any lab test that is abnormal or we need to change your treatment, we will call you to review the results.   Testing/Procedures: none   Follow-Up: At [facility], you and your health needs are our priority.  As part of our continuing mission to provide you with exceptional heart care, we have created designated Provider Care Teams.  These Care Teams include your primary Cardiologist (physician) and Advanced Practice Providers (APPs -  Physician Assistants and Nurse Practitioners) who all work together to provide you with the care you need, when you need it.  We recommend signing up for the patient portal called "MyChart".  Sign up information is provided on this After Visit Summary.  MyChart is used to connect with patients for Virtual Visits (Telemedicine).  Patients are able to view lab/test results, encounter notes, upcoming appointments, etc.  Non-urgent messages can be sent to your provider as well.   To learn more about what you can do with MyChart, go to [email or web address].    Your next appointment:   1 year(s)  The format for your next appointment:   In Person  Provider:   Sinclair Grooms, MD     Other Instructions     Signed, Sinclair Grooms, MD  08/06/2021 2:46 PM    [place]

## 2021-08-06 ENCOUNTER — Other Ambulatory Visit: Payer: Self-pay

## 2021-08-06 ENCOUNTER — Ambulatory Visit: Payer: Medicare Other | Admitting: Interventional Cardiology

## 2021-08-06 ENCOUNTER — Encounter: Payer: Self-pay | Admitting: Interventional Cardiology

## 2021-08-06 VITALS — BP 122/70 | HR 75 | Ht 70.0 in | Wt 167.0 lb

## 2021-08-06 DIAGNOSIS — G4730 Sleep apnea, unspecified: Secondary | ICD-10-CM

## 2021-08-06 DIAGNOSIS — I2510 Atherosclerotic heart disease of native coronary artery without angina pectoris: Secondary | ICD-10-CM | POA: Diagnosis not present

## 2021-08-06 DIAGNOSIS — E785 Hyperlipidemia, unspecified: Secondary | ICD-10-CM | POA: Diagnosis not present

## 2021-08-06 DIAGNOSIS — I5042 Chronic combined systolic (congestive) and diastolic (congestive) heart failure: Secondary | ICD-10-CM

## 2021-08-06 DIAGNOSIS — I7143 Infrarenal abdominal aortic aneurysm, without rupture: Secondary | ICD-10-CM

## 2021-08-06 DIAGNOSIS — I10 Essential (primary) hypertension: Secondary | ICD-10-CM | POA: Diagnosis not present

## 2021-08-06 DIAGNOSIS — Z720 Tobacco use: Secondary | ICD-10-CM | POA: Diagnosis not present

## 2021-08-06 NOTE — Patient Instructions (Signed)
Medication Instructions:  Your physician recommends that you continue on your current medications as directed. Please refer to the Current Medication list given to you today.  *If you need a refill on your cardiac medications before your next appointment, please call your pharmacy*   Lab Work: none If you have labs (blood work) drawn today and your tests are completely normal, you will receive your results only by: [place] (if you have MyChart) OR A paper copy in the mail If you have any lab test that is abnormal or we need to change your treatment, we will call you to review the results.   Testing/Procedures: none   Follow-Up: At [facility], you and your health needs are our priority.  As part of our continuing mission to provide you with exceptional heart care, we have created designated Provider Care Teams.  These Care Teams include your primary Cardiologist (physician) and Advanced Practice Providers (APPs -  Physician Assistants and Nurse Practitioners) who all work together to provide you with the care you need, when you need it.  We recommend signing up for the patient portal called "MyChart".  Sign up information is provided on this After Visit Summary.  MyChart is used to connect with patients for Virtual Visits (Telemedicine).  Patients are able to view lab/test results, encounter notes, upcoming appointments, etc.  Non-urgent messages can be sent to your provider as well.   To learn more about what you can do with MyChart, go to [email or web address].    Your next appointment:   1 year(s)  The format for your next appointment:   In Person  Provider:   Sinclair Grooms, MD     Other Instructions

## 2021-08-12 DIAGNOSIS — H401491 Capsular glaucoma with pseudoexfoliation of lens, unspecified eye, mild stage: Secondary | ICD-10-CM | POA: Diagnosis not present

## 2021-08-12 DIAGNOSIS — H40003 Preglaucoma, unspecified, bilateral: Secondary | ICD-10-CM | POA: Diagnosis not present

## 2021-08-12 DIAGNOSIS — H401410 Capsular glaucoma with pseudoexfoliation of lens, right eye, stage unspecified: Secondary | ICD-10-CM | POA: Diagnosis not present

## 2021-08-26 DIAGNOSIS — H401491 Capsular glaucoma with pseudoexfoliation of lens, unspecified eye, mild stage: Secondary | ICD-10-CM | POA: Diagnosis not present

## 2021-09-24 DIAGNOSIS — G4733 Obstructive sleep apnea (adult) (pediatric): Secondary | ICD-10-CM | POA: Diagnosis not present

## 2021-09-24 DIAGNOSIS — G4730 Sleep apnea, unspecified: Secondary | ICD-10-CM | POA: Diagnosis not present

## 2021-09-30 DIAGNOSIS — H401410 Capsular glaucoma with pseudoexfoliation of lens, right eye, stage unspecified: Secondary | ICD-10-CM | POA: Diagnosis not present

## 2021-12-01 DIAGNOSIS — H401410 Capsular glaucoma with pseudoexfoliation of lens, right eye, stage unspecified: Secondary | ICD-10-CM | POA: Diagnosis not present

## 2021-12-03 DIAGNOSIS — I10 Essential (primary) hypertension: Secondary | ICD-10-CM | POA: Diagnosis not present

## 2021-12-03 DIAGNOSIS — R2681 Unsteadiness on feet: Secondary | ICD-10-CM | POA: Diagnosis not present

## 2021-12-03 DIAGNOSIS — G894 Chronic pain syndrome: Secondary | ICD-10-CM | POA: Diagnosis not present

## 2021-12-03 DIAGNOSIS — G4733 Obstructive sleep apnea (adult) (pediatric): Secondary | ICD-10-CM | POA: Diagnosis not present

## 2021-12-03 DIAGNOSIS — R232 Flushing: Secondary | ICD-10-CM | POA: Diagnosis not present

## 2021-12-23 DIAGNOSIS — G4733 Obstructive sleep apnea (adult) (pediatric): Secondary | ICD-10-CM | POA: Diagnosis not present

## 2021-12-23 DIAGNOSIS — G4730 Sleep apnea, unspecified: Secondary | ICD-10-CM | POA: Diagnosis not present

## 2022-03-03 DIAGNOSIS — F17210 Nicotine dependence, cigarettes, uncomplicated: Secondary | ICD-10-CM | POA: Diagnosis not present

## 2022-03-03 DIAGNOSIS — R232 Flushing: Secondary | ICD-10-CM | POA: Diagnosis not present

## 2022-03-03 DIAGNOSIS — I10 Essential (primary) hypertension: Secondary | ICD-10-CM | POA: Diagnosis not present

## 2022-03-03 DIAGNOSIS — R0609 Other forms of dyspnea: Secondary | ICD-10-CM | POA: Diagnosis not present

## 2022-03-04 ENCOUNTER — Other Ambulatory Visit: Payer: Self-pay | Admitting: Geriatric Medicine

## 2022-03-04 DIAGNOSIS — R0609 Other forms of dyspnea: Secondary | ICD-10-CM

## 2022-03-09 DIAGNOSIS — H40003 Preglaucoma, unspecified, bilateral: Secondary | ICD-10-CM | POA: Diagnosis not present

## 2022-03-09 DIAGNOSIS — H401410 Capsular glaucoma with pseudoexfoliation of lens, right eye, stage unspecified: Secondary | ICD-10-CM | POA: Diagnosis not present

## 2022-03-09 DIAGNOSIS — H27121 Anterior dislocation of lens, right eye: Secondary | ICD-10-CM | POA: Diagnosis not present

## 2022-03-19 ENCOUNTER — Ambulatory Visit: Payer: Medicare Other | Attending: Geriatric Medicine

## 2022-03-19 DIAGNOSIS — R0609 Other forms of dyspnea: Secondary | ICD-10-CM | POA: Diagnosis not present

## 2022-03-19 DIAGNOSIS — R0600 Dyspnea, unspecified: Secondary | ICD-10-CM | POA: Diagnosis not present

## 2022-03-19 DIAGNOSIS — F17210 Nicotine dependence, cigarettes, uncomplicated: Secondary | ICD-10-CM | POA: Insufficient documentation

## 2022-03-19 LAB — PULMONARY FUNCTION TEST ARMC ONLY
DL/VA % pred: 40 %
DL/VA: 1.58 ml/min/mmHg/L
DLCO unc % pred: 44 %
DLCO unc: 10.9 ml/min/mmHg
FEF 25-75 Pre: 1.99 L/sec
FEF2575-%Pred-Pre: 103 %
FEV1-%Pred-Pre: 99 %
FEV1-Pre: 2.83 L
FEV1FVC-%Pred-Pre: 101 %
FEV6-%Pred-Pre: 103 %
FEV6-Pre: 3.84 L
FEV6FVC-%Pred-Pre: 105 %
FVC-%Pred-Pre: 97 %
FVC-Pre: 3.9 L
Pre FEV1/FVC ratio: 73 %
Pre FEV6/FVC Ratio: 99 %
RV % pred: 100 %
RV: 2.68 L
TLC % pred: 98 %
TLC: 6.93 L

## 2022-03-19 MED ORDER — ALBUTEROL SULFATE (2.5 MG/3ML) 0.083% IN NEBU
2.5000 mg | INHALATION_SOLUTION | Freq: Once | RESPIRATORY_TRACT | Status: AC
  Filled 2022-03-19: qty 3

## 2022-03-22 ENCOUNTER — Other Ambulatory Visit: Payer: Self-pay | Admitting: Physician Assistant

## 2022-03-26 DIAGNOSIS — G4733 Obstructive sleep apnea (adult) (pediatric): Secondary | ICD-10-CM | POA: Diagnosis not present

## 2022-03-26 DIAGNOSIS — G4730 Sleep apnea, unspecified: Secondary | ICD-10-CM | POA: Diagnosis not present

## 2022-05-06 DIAGNOSIS — T8529XA Other mechanical complication of intraocular lens, initial encounter: Secondary | ICD-10-CM | POA: Diagnosis not present

## 2022-05-14 ENCOUNTER — Ambulatory Visit (INDEPENDENT_AMBULATORY_CARE_PROVIDER_SITE_OTHER): Payer: Medicare Other

## 2022-05-14 ENCOUNTER — Ambulatory Visit
Admission: EM | Admit: 2022-05-14 | Discharge: 2022-05-14 | Disposition: A | Discharge status: Home / Self Care | Payer: Medicare Other | Attending: Family Medicine | Admitting: Family Medicine

## 2022-05-14 DIAGNOSIS — R42 Dizziness and giddiness: Secondary | ICD-10-CM

## 2022-05-14 DIAGNOSIS — G319 Degenerative disease of nervous system, unspecified: Secondary | ICD-10-CM | POA: Diagnosis not present

## 2022-05-14 DIAGNOSIS — I739 Peripheral vascular disease, unspecified: Secondary | ICD-10-CM | POA: Diagnosis not present

## 2022-05-14 LAB — BASIC METABOLIC PANEL
Anion gap: 8 (ref 5–15)
BUN: 12 mg/dL (ref 8–23)
CO2: 22 mmol/L (ref 22–32)
Calcium: 9 mg/dL (ref 8.9–10.3)
Chloride: 106 mmol/L (ref 98–111)
Creatinine, Ser: 0.9 mg/dL (ref 0.61–1.24)
GFR, Estimated: >60 mL/min (ref >60)
Glucose, Bld: 97 mg/dL (ref 70–99)
Potassium: 4.2 mmol/L (ref 3.5–5.1)
Sodium: 136 mmol/L (ref 135–145)

## 2022-05-14 LAB — CBC WITH DIFFERENTIAL/PLATELET
Abs Immature Granulocytes: 0.03 10*3/uL (ref 0.00–0.07)
Basophils Absolute: 0.1 10*3/uL (ref 0.0–0.1)
Basophils Relative: 1 %
Eosinophils Absolute: 0.1 10*3/uL (ref 0.0–0.5)
Eosinophils Relative: 1 %
HCT: 43.3 % (ref 39.0–52.0)
Hemoglobin: 14.6 g/dL (ref 13.0–17.0)
Immature Granulocytes: 0 %
Lymphocytes Relative: 16 %
Lymphs Abs: 1.5 10*3/uL (ref 0.7–4.0)
MCH: 29.5 pg (ref 26.0–34.0)
MCHC: 33.7 g/dL (ref 30.0–36.0)
MCV: 87.5 fL (ref 80.0–100.0)
Monocytes Absolute: 0.9 10*3/uL (ref 0.1–1.0)
Monocytes Relative: 10 %
Neutro Abs: 6.6 10*3/uL (ref 1.7–7.7)
Neutrophils Relative %: 72 %
Platelets: 240 10*3/uL (ref 150–400)
RBC: 4.95 MIL/uL (ref 4.22–5.81)
RDW: 12.9 % (ref 11.5–15.5)
WBC: 9.1 10*3/uL (ref 4.0–10.5)
nRBC: 0 % (ref 0.0–0.2)

## 2022-05-14 LAB — TROPONIN I (HIGH SENSITIVITY): Troponin I (High Sensitivity): 8 ng/L (ref <18)

## 2022-05-14 NOTE — Discharge Instructions (Addendum)
Your blood work was normal.  Your CT head did not show any acute cause of your symptoms.  You are not anemic, your kidney function is not elevated, you have no increased reasons for infection and your electrolytes are not abnormal.  If your symptoms persist, go to the emergency department, as discussed.  Follow-up with your primary care doctor earlier next week.  Go to ED for red flag symptoms, including; fevers you cannot reduce with Tylenol/Motrin, severe headaches, vision changes, numbness/weakness in part of the body, lethargy, confusion, intractable vomiting, severe dehydration, chest pain, breathing difficulty, severe persistent abdominal or pelvic pain, signs of severe infection (increased redness, swelling of an area), feeling faint or passing out, dizziness, etc. You should especially go to the ED for sudden acute worsening of condition if you do not elect to go at this time.

## 2022-05-14 NOTE — ED Triage Notes (Addendum)
Pt states he woke up 4:30am this morning felt flushed, lightheaded, fuzzy headed. Pt denies any dizziness, pt states he checked his BP at home and was the same as in office 149/92. Pt also reports weakness in legs  as well since this morning. Pt does report previous hx of MI in 1998, pt denies any chest pains

## 2022-05-14 NOTE — ED Provider Notes (Signed)
[facility]    CSN: [id number] Arrival date & time: 05/14/22  1310      History   Chief Complaint Chief Complaint  Patient presents with   Lightheaded    HPI Brian [name] is a 81 y.o. male.   HPI  Brian [name] presents for leg weakness and lightheadedness that started around 4 30-5 this morning.  Says it feels like "fuzziness".  He does not use any walking devices.  Felt like he was weak in the legs this morning.  Denies room spinning dizziness.  There has been no headache or neck pain.  States that when he was walking sometimes he felt like he has to hold onto the furniture.  Denies any confusion, trouble finding his words, coordination issues and new trouble remembering things.  He wears a CPAP at night and then had trouble breathing.  History of intermittent anxiety and felt like he was going through this at that time.  Does not take any medication for this as he has not had an episode in quite a while.  Notes that he had a heart attack in the 90s and follows with Dr. Tamala Julian.  Takes metoprolol for his blood pressure and notes that he has not doubled up or missed any doses.  Says his blood pressures usually range in the systolic 354S.  Took his blood pressure at home and noticed it was elevated and came to the urgent care.  Denies any headache, neck pain or new back pain.  He has not had any fever, chills or myalgias.  He has otherwise been well.    He took a Percocet this morning around 6 AM as he does nearly every morning for chronic pain.  Past Medical History:  Diagnosis Date   Adenomatous polyp    Carpal tunnel syndrome on left    Cervical spine disease    Coronary artery disease    S/p anterior MI tx with PCI in 1998  //  Myoview 6/22: EF 45, ant and septal scar; no ischemia; intermediate risk // Echocardiogram 7/22: EF 45-50, ant-sept and apical HK, Gr 1 DD, normal RVSF, mild MR   Hyperlipidemia    Hypertension    Low back pain    Myocardial infarction ([place])  1998   Sleep apnea    CPAP   Wears dentures    full upper    Patient Active Problem List   Diagnosis Date Noted   Hematochezia    Polyp of ascending colon    Polyp of sigmoid colon    Prostate nodule 05/22/2019   Other chronic pain 05/22/2019   Pulmonary nodule 05/22/2019   Other long term (current) drug therapy 05/22/2019   Nicotine dependence, unspecified, uncomplicated 56/81/2751   Sleep apnea    Low back pain    Hypertension    Coronary artery disease involving native coronary artery of native heart with angina pectoris (HCC)    Cervical spine disease    Carpal tunnel syndrome on left    Adenomatous polyp    BRBPR (bright red blood per rectum) 02/14/2016   Aortic calcification ([place]) 05/08/2015   Enteritis due to Clostridium difficile 02/21/2015   Ischemic bowel disease ([place])    Hyperlipidemia 02/05/2010   TOBACCO USER 02/05/2010   HYPERSOMNIA WITH SLEEP APNEA UNSPECIFIED 02/05/2010   Myocardial infarction ([place]) 07/20/1996    Past Surgical History:  Procedure Laterality Date   APPENDECTOMY     bunions     CATARACT EXTRACTION     COLONOSCOPY  WITH PROPOFOL N/A 02/19/2015   Procedure: COLONOSCOPY WITH PROPOFOL;  Surgeon: Lucilla Lame, MD;  Location: [facility];  Service: Endoscopy;  Laterality: N/A;   COLONOSCOPY WITH PROPOFOL N/A 06/05/2019   Procedure: COLONOSCOPY WITH BIOPSY;  Surgeon: Lucilla Lame, MD;  Location: [place];  Service: Endoscopy;  Laterality: N/A;  sleep apnea   HEMORRHOID BANDING     HERNIA REPAIR     lumbar disc protrussion  03/2010   POLYPECTOMY N/A 06/05/2019   Procedure: POLYPECTOMY;  Surgeon: Lucilla Lame, MD;  Location: [place];  Service: Endoscopy;  Laterality: N/A;       Home Medications    Prior to Admission medications   Medication Sig Start Date End Date Taking? Authorizing Provider  acetaminophen (TYLENOL) 325 MG tablet Take 325 mg by mouth daily as needed.   Yes [provider]  aspirin 81 MG  tablet Take 81 mg by mouth daily.     Yes [provider]  atorvastatin (LIPITOR) 40 MG tablet Take 40 mg by mouth daily.     Yes [provider]  Coenzyme Q10 (COQ10) 100 MG CAPS Take 1 capsule by mouth daily.     Yes [provider]  metoprolol succinate (TOPROL-XL) 25 MG 24 hr tablet TAKE 1 TABLET(25 MG) BY MOUTH DAILY 03/24/22  Yes Belva Crome, MD  Multiple Vitamin (MULTIVITAMIN PO) Take 1 tablet by mouth daily.   Yes [provider]  nitroGLYCERIN (NITROSTAT) 0.4 MG SL tablet Place 0.4 mg under the tongue every 5 (five) minutes as needed for chest pain.   Yes [provider]  oxyCODONE-acetaminophen (PERCOCET) 10-325 MG tablet Take one to two tablets by mouth every 4 to 6 hours as needed for pain.   Yes [provider]  influenza vaccine adjuvanted (FLUAD QUADRIVALENT) 0.5 ML injection Inject into the muscle. 04/25/21   Carlyle Basques, MD  latanoprost (XALATAN) 0.005 % ophthalmic solution Place 1 drop into the right eye at bedtime. 03/04/22   [provider]  quinapril (ACCUPRIL) 10 MG tablet Take 10 mg by mouth daily.    [provider]    Family History Family History  Problem Relation Age of Onset   Kidney disease Father    Hypertension Father    Heart failure Mother    Coronary artery disease Brother     Social History Social History   Tobacco Use   Smoking status: Every Day    Packs/day: 0.50    Years: 60.00    Total pack years: 30.00    Types: Cigarettes   Smokeless tobacco: Never   Tobacco comments:    1/2 3/4 ppd (since age 81)  Vaping Use   Vaping Use: Never used  Substance Use Topics   Alcohol use: Not Currently    Alcohol/week: 0.0 standard drinks of alcohol    Comment: occasionally   Drug use: No     Allergies   Patient has no known allergies.   Review of Systems Review of Systems : negative unless otherwise stated in HPI.      Physical Exam Triage Vital Signs ED Triage  Vitals  Enc Vitals Group     BP 05/14/22 1323 (!) 149/92     Pulse Rate 05/14/22 1323 66     Resp --      Temp 05/14/22 1323 98.2 F (36.8 C)     Temp Source 05/14/22 1323 Oral     SpO2 05/14/22 1323 97 %     Weight 05/14/22  1320 161 lb (73 kg)     Height 05/14/22 1320 '5\' 10"'$  (1.778 m)     Head Circumference --      Peak Flow --      Pain Score 05/14/22 1320 0     Pain Loc --      Pain Edu? --      Excl. in [place]? --    Orthostatic VS for the past 24 hrs:  BP- Lying Pulse- Lying BP- Sitting Pulse- Sitting BP- Standing at 0 minutes Pulse- Standing at 0 minutes  05/14/22 1338 (!) 155/98 65 (!) 133/93 69 134/83 71    Updated Vital Signs BP (!) 149/92 (BP Location: Left Arm)   Pulse 66   Temp 98.2 F (36.8 C) (Oral)   Ht '5\' 10"'$  (1.778 m)   Wt 73 kg   SpO2 97%   BMI 23.10 kg/m   Visual Acuity Right Eye Distance:   Left Eye Distance:   Bilateral Distance:    Right Eye Near:   Left Eye Near:    Bilateral Near:     Physical Exam  GEN: alert, well appearing elderly male, in no acute distress     HENT:  mucus membranes moist, oropharyngeal without lesions or erythema,  nares patent, no nasal discharge, uvula midline EYES:   pupils equal and reactive and accommodation, EOM intact NECK:  supple, normal ROM, no C-spine tenderness RESP:  clear to auscultation bilaterally, no increased work of breathing   CVS:   regular rate and rhythm, no murmur, distal pulses intact    EXT:   normal ROM, atraumatic, no edema   NEURO:  alert, oriented x4, speech normal, CN 2-12 grossly intact, no facial droop,  sensation grossly intact, strength 5/5 bilateral UE and LLE, 4/5 RLE (not new, per pt), normal coordination, normal finger to nose,  positive Romberg  Skin:   warm and dry, no rash, normal  skin turgor Psych: Normal affect, appropriate speech and behavior    UC Treatments / Results  Labs (all labs ordered are listed, but only abnormal results are displayed) Labs Reviewed  CBC WITH  DIFFERENTIAL/PLATELET  BASIC METABOLIC PANEL  TROPONIN I (HIGH SENSITIVITY)    EKG   Radiology CT Head Wo Contrast  Result Date: 05/14/2022 CLINICAL DATA:  Dizziness, persistent/recurrent, cardiac or vascular cause suspected EXAM: CT HEAD WITHOUT CONTRAST TECHNIQUE: Contiguous axial images were obtained from the base of the skull through the vertex without intravenous contrast. RADIATION DOSE REDUCTION: This exam was performed according to the departmental dose-optimization program which includes automated exposure control, adjustment of the mA and/or kV according to patient size and/or use of iterative reconstruction technique. COMPARISON:  None Available. FINDINGS: Brain: There is atrophy and chronic small vessel disease changes. No acute intracranial abnormality. Specifically, no hemorrhage, hydrocephalus, mass lesion, acute infarction, or significant intracranial injury. Vascular: No hyperdense vessel or unexpected calcification. Skull: No acute calvarial abnormality. Sinuses/Orbits: No acute findings Other: None IMPRESSION: Atrophy, chronic microvascular disease. No acute intracranial abnormality. Electronically Signed   By: Rolm Baptise M.D.   On: 05/14/2022 14:43    Procedures Procedures (including critical care time)  Medications Ordered in UC Medications - No data to display  Initial Impression / Assessment and Plan / UC Course  I have reviewed the triage vital signs and the nursing notes.  Pertinent labs & imaging results that were available during my care of the patient were reviewed by me and considered in my medical decision making (see chart for details).  Patient is an 81 year old male with history of hyperlipidemia, CAD, anterior MI s/p angioplasty, hypertension, chronic pain and OSA who presents for persistent " fuzziness" and elevated blood pressures that started around 5 AM today.  Orthostatic vitals were positive systolic however patient was talking during initial  blood pressure.  Given his perceived neurological changes will obtain labs and CT head.  EKG with left anterior fascicular block and occasional PVC normal sinus rhythm.  There were no acute ST or T wave changes.  On chart review, he had an echo in July 2022 that showed EF 45 to 50% with grade 1 diastolic dysfunction, moderate hypokinesis of the left ventricular mid apical and anterior septal wall segments and mild mitral valve regurg.  He was last seen by cardiology in January of this year.  Follows with Dr. Tamala Julian, cardiologist.  There is a note in the chart from May of this year that noted unsteadiness on the feet.  Has history of right foot drop therefore his unsteadiness is not new.  CBC unremarkable for leukocytosis and anemia.  High-sensitivity troponin 8.  BMP without electrolyte abnormalities and kidney function is stable.  CT head with chronic vascular changes but no acute processes.  I suspect he is having some intermittent orthostasis.  He is mildly hypertensive here today but had positive orthostatic vitals.  Patient is mentating very well for his age and neuro exam is at his baseline.  Strict ED precautions given and he voiced understanding.  He is stable for discharge with follow-up with PCP early next week.    Final Clinical Impressions(s) / UC Diagnoses   Final diagnoses:  Lightheadedness     Discharge Instructions      Your blood work was normal.  Your CT head did not show any acute cause of your symptoms.  You are not anemic, your kidney function is not elevated, you have no increased reasons for infection and your electrolytes are not abnormal.  If your symptoms persist, go to the emergency department, as discussed.  Follow-up with your primary care doctor earlier next week.  Go to ED for red flag symptoms, including; fevers you cannot reduce with Tylenol/Motrin, severe headaches, vision changes, numbness/weakness in part of the body, lethargy, confusion, intractable  vomiting, severe dehydration, chest pain, breathing difficulty, severe persistent abdominal or pelvic pain, signs of severe infection (increased redness, swelling of an area), feeling faint or passing out, dizziness, etc. You should especially go to the ED for sudden acute worsening of condition if you do not elect to go at this time.       ED Prescriptions   None    PDMP not reviewed this encounter.   Lyndee Hensen, DO 05/14/22 2022

## 2022-05-15 ENCOUNTER — Telehealth: Payer: Self-pay | Admitting: Interventional Cardiology

## 2022-05-15 NOTE — Telephone Encounter (Signed)
Pt c/o Shortness Of Breath: STAT if SOB developed within the last 24 hours or pt is noticeably SOB on the phone  1. Are you currently SOB (can you hear that pt is SOB on the phone)?   No  2. How long have you been experiencing SOB?  Since yesterday morning  3. Are you SOB when sitting or when up moving around?   Both  4. Are you currently experiencing any other symptoms?   Patient stated yesterday he felt lightheaded, jittery and dry mouth for 3 hours.  Patient stated he went to Urgent Care and they did some tests.  Patient stated he felt a warm, flushed feeling coming over him which lasted about 5 minutes.  Patient stated he got a flu shot and the COVID booster last Sunday.  BP 149/103.

## 2022-05-15 NOTE — Telephone Encounter (Signed)
Returned call to patient.  Patient denies SOB, CP, headache or visual changes. He reports feeling lightheaded (he describes as "a fuzzy feeling"), jitteriness, nervousness. He reports feeling a warm, flushing sensation come over him at random intervals lasting 5-10 minutes.   Symptoms began yesterday, dissipated in the evening and began again today at 9:15am.  Patient was seen in urgent care yesterday. Troponin I, CBC, BMET all WNL. CT head clear. EKG with left anterior fascicular block and occasional PVC normal sinus rhythm.  There were no acute ST or T wave changes. Positive orthostatic VS (lying 155/98, HR 65  sitting 133/93, HR 69  standing 134/83, HR 71)  Patient reports BP today 149/103, HR 73  Patient states he had his flu and COVID booster vaccines on 05/10/22.  Discussed with DOD Dr. Angelena Form, advised patient drink plenty of fluids, send note to Dr. Tamala Julian, and if no improvement in symptoms for patient to call back next week.  Notified patient of the above, he verbalized understanding and states he will increase fluids and contact PCP.

## 2022-05-16 ENCOUNTER — Emergency Department: Payer: Medicare Other

## 2022-05-16 ENCOUNTER — Other Ambulatory Visit: Payer: Self-pay

## 2022-05-16 ENCOUNTER — Emergency Department
Admission: EM | Admit: 2022-05-16 | Discharge: 2022-05-16 | Disposition: A | Discharge status: Home / Self Care | Payer: Medicare Other | Attending: Emergency Medicine | Admitting: Emergency Medicine

## 2022-05-16 DIAGNOSIS — R2689 Other abnormalities of gait and mobility: Secondary | ICD-10-CM | POA: Diagnosis not present

## 2022-05-16 DIAGNOSIS — I10 Essential (primary) hypertension: Secondary | ICD-10-CM | POA: Diagnosis not present

## 2022-05-16 DIAGNOSIS — I2510 Atherosclerotic heart disease of native coronary artery without angina pectoris: Secondary | ICD-10-CM | POA: Diagnosis not present

## 2022-05-16 DIAGNOSIS — R42 Dizziness and giddiness: Secondary | ICD-10-CM | POA: Diagnosis not present

## 2022-05-16 DIAGNOSIS — R269 Unspecified abnormalities of gait and mobility: Secondary | ICD-10-CM

## 2022-05-16 LAB — CBC
HCT: 45 % (ref 39.0–52.0)
Hemoglobin: 15 g/dL (ref 13.0–17.0)
MCH: 29.2 pg (ref 26.0–34.0)
MCHC: 33.3 g/dL (ref 30.0–36.0)
MCV: 87.7 fL (ref 80.0–100.0)
Platelets: 250 10*3/uL (ref 150–400)
RBC: 5.13 MIL/uL (ref 4.22–5.81)
RDW: 12.7 % (ref 11.5–15.5)
WBC: 10.6 10*3/uL — ABNORMAL HIGH (ref 4.0–10.5)
nRBC: 0 % (ref 0.0–0.2)

## 2022-05-16 LAB — URINALYSIS, ROUTINE W REFLEX MICROSCOPIC
Bilirubin Urine: NEGATIVE
Glucose, UA: NEGATIVE mg/dL
Hgb urine dipstick: NEGATIVE
Ketones, ur: NEGATIVE mg/dL
Leukocytes,Ua: NEGATIVE
Nitrite: NEGATIVE
Protein, ur: NEGATIVE mg/dL
Specific Gravity, Urine: 1.012 (ref 1.005–1.030)
pH: 7 (ref 5.0–8.0)

## 2022-05-16 LAB — BASIC METABOLIC PANEL
Anion gap: 9 (ref 5–15)
BUN: 14 mg/dL (ref 8–23)
CO2: 22 mmol/L (ref 22–32)
Calcium: 9.3 mg/dL (ref 8.9–10.3)
Chloride: 106 mmol/L (ref 98–111)
Creatinine, Ser: 0.93 mg/dL (ref 0.61–1.24)
GFR, Estimated: >60 mL/min (ref >60)
Glucose, Bld: 129 mg/dL — ABNORMAL HIGH (ref 70–99)
Potassium: 4.3 mmol/L (ref 3.5–5.1)
Sodium: 137 mmol/L (ref 135–145)

## 2022-05-16 NOTE — ED Provider Notes (Signed)
[facility] Provider Note    Event Date/Time   First MD Initiated Contact with Patient 05/16/22 1535     (approximate)   History   Dizziness   HPI  Brian [name] is a 81 y.o. male here for evaluation of feeling like he is at times lightheaded and feels like his balance has been off since Thursday.  Throughout the day usually and not so much in the morning but more in the afternoon and evening he notices a feeling like he is just a little bit off balance or slightly wobbly with walking.  He also feels a sense of lightheadedness at times when he is up and moving.  No shortness of breath.  No chest pain.  He checked his oxygen levels at home today and they were normal   Reviewed note from urgent care from October 26 of this year patient was seen evaluated for a feeling of "fuzziness" and also feeling of weakness in his legs.  His work-up at the urgent care was reassuring had normal CBC normal metabolic panel normal troponin and was discharged with return precautions.  Additionally had a CT scan of his head at urgent care  Past medical history includes hypertension hyperlipidemia previous MI coronary artery disease  Physical Exam   Triage Vital Signs: ED Triage Vitals  Enc Vitals Group     BP 05/16/22 1405 (!) 141/83     Pulse Rate 05/16/22 1405 68     Resp 05/16/22 1405 18     Temp 05/16/22 1405 98.3 F (36.8 C)     Temp Source 05/16/22 1405 Oral     SpO2 05/16/22 1405 98 %     Weight --      Height --      Head Circumference --      Peak Flow --      Pain Score 05/16/22 1406 0     Pain Loc --      Pain Edu? --      Excl. in [place]? --     Most recent vital signs: Vitals:   05/16/22 1915 05/16/22 1930  BP:  (!) 143/94  Pulse: 60 60  Resp: (!) 22 (!) 25  Temp:    SpO2: 91% 98%     General: Awake, no distress.  Very pleasant.  Accompanied by his daughter. CV:  Good peripheral perfusion.  Normal rate and tones.  No murmur Resp:  Normal  effort.  Clear bilaterally.  Speaks in full clear sentences.  Normal oxygen saturation on room air. Abd:  No distention.  Soft nontender nondistended throughout Other:  Moves all extremities with 5 out of 5 strength.  He has normal intact patellar reflexes bilateral.  He demonstrates slight weakness in the right foot and right foot drop but reports this to be chronic for years time nothing new.  He has no pronator drift in any extremity.  He demonstrates no ataxia finger-nose-finger bilateral.  He does however stand and he is able to walk independently but reports that he does so that he feels just slightly off balance, but is not actually falling or listing to the side or otherwise.  He is able to ambulate back and forth across the room without assistance and get back into bed.  Cranial nerve exam normal.  Extraocular movements normal.  Normal sensation lower extremities bilateral.  5 out of 5 strength to plantar and dorsiflexion left foot left knee left hip, slightly reduced in the right knee  right hip right foot but again this is reportedly chronic.   ED Results / Procedures / Treatments   Labs (all labs ordered are listed, but only abnormal results are displayed) Labs Reviewed  BASIC METABOLIC PANEL - Abnormal; Notable for the following components:      Result Value   Glucose, Bld 129 (*)    All other components within normal limits  CBC - Abnormal; Notable for the following components:   WBC 10.6 (*)    All other components within normal limits  URINALYSIS, ROUTINE W REFLEX MICROSCOPIC - Abnormal; Notable for the following components:   Color, Urine YELLOW (*)    APPearance CLEAR (*)    All other components within normal limits  CBG MONITORING, ED     EKG  And interpreted by me at 1410 heart rate 70 QRS 90 QTc 440 Normal sinus rhythm, occasional PVC.  Mild intraventricular conduction delay, anteroseptal Q waves.  Compared with previous EKG this appears to be an old, chronic finding.   No evidence of acute ischemia   RADIOLOGY  Chest x-ray interpreted as findings of possible emphysema.  No acute infiltrate  MR BRAIN WO CONTRAST  Result Date: 05/16/2022 CLINICAL DATA:  Dizziness, gait feels off balance EXAM: MRI HEAD WITHOUT CONTRAST TECHNIQUE: Multiplanar, multiecho pulse sequences of the brain and surrounding structures were obtained without intravenous contrast. COMPARISON:  08/21/2012 MRI head, correlation is also made with CT head 05/14/2022 FINDINGS: Brain: No restricted diffusion to suggest acute or subacute infarct. No acute hemorrhage, mass, mass effect, or midline shift. No hydrocephalus or extra-axial collection. No hemosiderin deposition to suggest remote hemorrhage. Confluent T2 hyperintense signal in the periventricular white matter, likely the sequela of moderate chronic small vessel ischemic disease. Cerebral volume is within normal limits for age. Vascular: Normal arterial flow voids. Skull and upper cervical spine: Normal marrow signal. Sinuses/Orbits: Clear paranasal sinuses. Status post bilateral lens replacements. Other: Trace fluid in the mastoid air cells. IMPRESSION: No acute intracranial process. No evidence of acute or subacute infarct. Electronically Signed   By: Merilyn Baba M.D.   On: 05/16/2022 19:03   DG Chest 2 View  Result Date: 05/16/2022 CLINICAL DATA:  Intermittent dizziness. EXAM: CHEST - 2 VIEW COMPARISON:  January 07, 2021 FINDINGS: Hyperinflation of the lungs with flattening of the diaphragm. The heart, hila, mediastinum, lungs, and pleura are otherwise unremarkable. IMPRESSION: Hyperinflation of the lungs consistent with COPD or emphysema. Electronically Signed   By: Dorise Bullion III M.D.   On: 05/16/2022 16:30       PROCEDURES:  Critical Care performed: No  Procedures   MEDICATIONS ORDERED IN ED: Medications - No data to display   IMPRESSION / MDM / [place] / ED COURSE  I reviewed the triage vital signs and the  nursing notes.                              Differential diagnosis includes, but is not limited to, possible stroke, deconditioning, orthostasis, dehydration, insidious infection, anemia, cardiac etiology etc.  Based on his clinical examination and history I think at this point the most prudent thing to do is rule out central cause such as stroke.  No clinical findings suggest a sending neuropathy, polyneuropathy, demyelinating syndrome etc.  Patient's presentation is most consistent with acute complicated illness / injury requiring diagnostic workup.  The patient is on the cardiac monitor to evaluate for evidence of arrhythmia and/or significant  heart rate changes.  ----------------------------------------- 8:45 PM on 05/16/2022 ----------------------------------------- MRI normal.  Urinalysis negative for evidence of infection.  Right patient resting comfortably.  At this point I do not see evidence of acute illness that would require hospitalization.  I discussed careful return precautions with the patient, he is going to follow-up with both primary care and his cardiologist.  If he should have increasing weakness, develop numbness, difficulty walking, muscle weakness, or other new concerns such as worsening symptoms, fevers, pain, etc. he return to the ER for further work-up and evaluation.  Return precautions and treatment recommendations and follow-up discussed with the patient who is agreeable with the plan.      FINAL CLINICAL IMPRESSION(S) / ED DIAGNOSES   Final diagnoses:  Lightheadedness  Gait abnormality     Rx / DC Orders   ED Discharge Orders     None        Note:  This document was prepared using Dragon voice recognition software and may include unintentional dictation errors.   Delman Kitten, MD 05/16/22 2046

## 2022-05-16 NOTE — ED Triage Notes (Signed)
Pt to ED via POV from home. Pt was seen on UC 10/26 for light headed, CP, SOB, flushed and feeling diaphoretic. Pt states symptoms have not subsided. UC work up was negative. Pt referred to ED if symptoms do not improve.

## 2022-05-16 NOTE — ED Notes (Signed)
Pt still in MRI 

## 2022-05-16 NOTE — ED Notes (Signed)
Pt resting comfortably in bed. No complaints. Daughter at bedside.

## 2022-05-16 NOTE — ED Notes (Signed)
Pt on phone with MRI

## 2022-05-16 NOTE — ED Notes (Signed)
Pt to ED for lightheadedness and SOB with exertion since Thursday. Alert, oriented.

## 2022-05-16 NOTE — ED Notes (Signed)
Pt to xray

## 2022-05-18 ENCOUNTER — Encounter: Payer: Self-pay | Admitting: Interventional Cardiology

## 2022-05-18 NOTE — Telephone Encounter (Signed)
Follow Up:      Patient's wife is calling. She said the patient went to the ER on Saturday. Patient was told to call Dr Tamala Julian first thing on Monday morning. Wife says his Symptoms are  very intense. Iightheaded, jittery, does not feel like he that can he concentrate.Patient wants to be seen today.

## 2022-05-18 NOTE — Telephone Encounter (Signed)
Spoke with patient and his wife.  Patient reports he had another episode of lightheadedness, jitteriness and warm/flushing sensation come over him this morning standing at the kitchen sink.  Patient denies SOB or CP.  Patient states he has increased oral hydration over the weekend with no improvement, was seen in ED on 05/16/22 and told they could not figure out what was causing his symptoms.  Patient reports BP 136/85, HR 86, O2 sat 97% today.  Patient reports he is not taking Quinapril and has been taking only metoprolol since June 2022, and since that time has had episodes of sweating, warm/flushing sensation and notes BP is higher than when he was taking Quinapril.  Patient has appointment with Richardson Dopp, PA-C 05/19/22. Advised patient to keep this appointment to evaluate symptoms in office.  Patient asked what he could do in the meantime for lightheadedness. Advised patient to continue to hydrate, eat his regular meals, and when feeling lightheaded to lie down and elevate feet above heart level.  Patient verbalized understanding and expressed appreciation for call.

## 2022-05-19 ENCOUNTER — Ambulatory Visit: Payer: Medicare Other | Attending: Physician Assistant | Admitting: Physician Assistant

## 2022-05-19 ENCOUNTER — Encounter: Payer: Self-pay | Admitting: Physician Assistant

## 2022-05-19 ENCOUNTER — Other Ambulatory Visit: Payer: Self-pay | Admitting: *Deleted

## 2022-05-19 VITALS — BP 134/90 | HR 74 | Ht 70.0 in | Wt 159.4 lb

## 2022-05-19 DIAGNOSIS — E7800 Pure hypercholesterolemia, unspecified: Secondary | ICD-10-CM | POA: Diagnosis not present

## 2022-05-19 DIAGNOSIS — I255 Ischemic cardiomyopathy: Secondary | ICD-10-CM

## 2022-05-19 DIAGNOSIS — R0602 Shortness of breath: Secondary | ICD-10-CM | POA: Insufficient documentation

## 2022-05-19 DIAGNOSIS — I25119 Atherosclerotic heart disease of native coronary artery with unspecified angina pectoris: Secondary | ICD-10-CM

## 2022-05-19 DIAGNOSIS — I10 Essential (primary) hypertension: Secondary | ICD-10-CM

## 2022-05-19 DIAGNOSIS — R42 Dizziness and giddiness: Secondary | ICD-10-CM | POA: Diagnosis not present

## 2022-05-19 HISTORY — DX: Ischemic cardiomyopathy: I25.5

## 2022-05-19 MED ORDER — QUINAPRIL HCL 10 MG PO TABS: ORAL_TABLET | ORAL | 3 refills | Status: DC

## 2022-05-19 NOTE — Assessment & Plan Note (Signed)
History of anterior MI in 1998.  Myoview in June 2022 demonstrated scar but no ischemia.  He is is not had chest discomfort to suggest angina.  His electrocardiogram is unchanged.  Obtain echocardiogram as noted.  Continue aspirin 81 mg daily, Lipitor 40 mg daily, Toprol-XL 25 mg daily.

## 2022-05-19 NOTE — Assessment & Plan Note (Signed)
EF 45-50 by echocardiogram in July 2022.  Volume status stable.  NYHA II.  He would benefit from at least beta-blocker and ACE/ARB.  Continue Toprol-XL 25 mg daily.  Resume quinapril as outlined above.  Obtain follow-up echocardiogram as outlined above.

## 2022-05-19 NOTE — Progress Notes (Signed)
Cardiology Office Note:    Date:  05/19/2022   ID:  Brian [name], DOB [birth year], MRN [id number]  PCP:  [name], MD  [place] Providers Cardiologist:  Sinclair Grooms, MD    Referring MD: [name], *   Chief Complaint:  Dizziness    Patient Profile: Coronary artery disease  S/p anterior MI tx with PCI in 1998 ETT 03/2014: no ischemic changes Myoview 12/2020: Ant scar, no ischemia, EF 45 (intermediate risk) Ischemic Cardiomyopathy Echo 01/17/21: EF 45-50, ant-sept and apical HK, Gr 1 DD, RVSP 28.4, mild MR Hypertension  Hyperlipidemia  OSA on CPAP  +Cigs Cervical spine disease  Lumbar disc disease  Plantar fasciitis 2/2 bone spur ABIs 01/2020: normal  AAA U/S 09/2019: no AAA  Cardiac Studies & Procedures     STRESS TESTS  NM MYOCAR MULTI W/SPECT W 01/07/2021  Narrative  Abnormal pharmacologic myocardial perfusion stress test.  There is a large in size, severe, fixed defect involving the anterior and septal walls as well as the apex consistent with scar.  There is no significant ischemia.  The left ventricular ejection fraction is moderately decreased (~45%) with anterior/septal hypokinesis.  Coronary artery calcification versus prior LAD stent is noted on the attenuation correction CT. Aortic atherosclerosis is present.  This is an intermediate risk study.   ECHOCARDIOGRAM  ECHOCARDIOGRAM COMPLETE 01/17/2021  Narrative ECHOCARDIOGRAM REPORT    Patient Name:   Brian [name] Date of Exam: 01/17/2021 Medical Rec #:  [id number]       Height:       70.0 in Accession #:    [id number]      Weight:       163.0 lb Date of Birth:  [birth year]      BSA:          1.914 m Patient Age:    81 years        BP:           140/78 mmHg Patient Gender: M               HR:           91 bpm. Exam Location:  [facility]  Procedure: 2D Echo, Cardiac Doppler, Color Doppler and Intracardiac Opacification Agent  Indications:     Chest pain  R07.9  History:         Patient has no prior history of Echocardiogram examinations. Previous Myocardial Infarction and CAD; Risk Factors:Hypertension.  Sonographer:     Sherrie Sport RDCS (AE) Referring Phys:  [id number] Jaquarius Seder Diagnosing Phys: Kathlyn Sacramento MD  IMPRESSIONS   1. Left ventricular ejection fraction, by estimation, is 45 to 50%. The left ventricle has mildly decreased function. The left ventricle demonstrates regional wall motion abnormalities (see scoring diagram/findings for description). Left ventricular diastolic parameters are consistent with Grade I diastolic dysfunction (impaired relaxation). There is moderate hypokinesis of the left ventricular, mid-apical anteroseptal wall and apical segment. 2. Right ventricular systolic function is normal. The right ventricular size is normal. Tricuspid regurgitation signal is inadequate for assessing PA pressure. 3. The mitral valve is normal in structure. Mild mitral valve regurgitation. No evidence of mitral stenosis. 4. The aortic valve is normal in structure. Aortic valve regurgitation is not visualized. No aortic stenosis is present. 5. The inferior vena cava is normal in size with greater than 50% respiratory variability, suggesting right atrial pressure of 3 mmHg.  FINDINGS Left Ventricle: Left ventricular ejection fraction, by estimation, is  45 to 50%. The left ventricle has mildly decreased function. The left ventricle demonstrates regional wall motion abnormalities. Moderate hypokinesis of the left ventricular, mid-apical anteroseptal wall and apical segment. Definity contrast agent was given IV to delineate the left ventricular endocardial borders. The left ventricular internal cavity size was normal in size. There is no left ventricular hypertrophy. Left ventricular diastolic parameters are consistent with Grade I diastolic dysfunction (impaired relaxation).  Right Ventricle: The right ventricular size is normal. No  increase in right ventricular wall thickness. Right ventricular systolic function is normal. Tricuspid regurgitation signal is inadequate for assessing PA pressure. The tricuspid regurgitant velocity is 2.52 m/s, and with an assumed right atrial pressure of 3 mmHg, the estimated right ventricular systolic pressure is 96.7 mmHg.  Left Atrium: Left atrial size was normal in size.  Right Atrium: Right atrial size was normal in size.  Pericardium: There is no evidence of pericardial effusion.  Mitral Valve: The mitral valve is normal in structure. Mild mitral valve regurgitation. No evidence of mitral valve stenosis.  Tricuspid Valve: The tricuspid valve is normal in structure. Tricuspid valve regurgitation is not demonstrated. No evidence of tricuspid stenosis.  Aortic Valve: The aortic valve is normal in structure. Aortic valve regurgitation is not visualized. No aortic stenosis is present. Aortic valve mean gradient measures 1.5 mmHg. Aortic valve peak gradient measures 2.5 mmHg. Aortic valve area, by VTI measures 4.74 cm.  Pulmonic Valve: The pulmonic valve was normal in structure. Pulmonic valve regurgitation is not visualized. No evidence of pulmonic stenosis.  Aorta: The aortic root is normal in size and structure.  Venous: The inferior vena cava is normal in size with greater than 50% respiratory variability, suggesting right atrial pressure of 3 mmHg.  IAS/Shunts: No atrial level shunt detected by color flow Doppler.   LEFT VENTRICLE PLAX 2D LVIDd:         4.80 cm      Diastology LVIDs:         3.47 cm      LV e' medial:    4.24 cm/s LV PW:         0.86 cm      LV E/e' medial:  15.0 LV IVS:        0.82 cm      LV e' lateral:   7.29 cm/s LVOT diam:     2.30 cm      LV E/e' lateral: 8.8 LV SV:         74 LV SV Index:   38 LVOT Area:     4.15 cm  LV Volumes (MOD) LV vol d, MOD A2C: 124.0 ml LV vol d, MOD A4C: 94.3 ml LV vol s, MOD A2C: 52.9 ml LV vol s, MOD A4C: 51.7  ml LV SV MOD A2C:     71.1 ml LV SV MOD A4C:     94.3 ml LV SV MOD BP:      59.4 ml  RIGHT VENTRICLE RV Basal diam:  2.67 cm RV S prime:     12.10 cm/s TAPSE (M-mode): 3.5 cm  LEFT ATRIUM             Index       RIGHT ATRIUM          Index LA diam:        3.80 cm 1.99 cm/m  RA Area:     9.27 cm LA Vol (A2C):   28.2 ml 14.74 ml/m RA Volume:   17.10 ml  8.94 ml/m LA Vol (A4C):   25.1 ml 13.12 ml/m LA Biplane Vol: 26.6 ml 13.90 ml/m AORTIC VALVE AV Area (Vmax):    5.04 cm AV Area (Vmean):   4.41 cm AV Area (VTI):     4.74 cm AV Vmax:           79.65 cm/s AV Vmean:          56.550 cm/s AV VTI:            0.155 m AV Peak Grad:      2.5 mmHg AV Mean Grad:      1.5 mmHg LVOT Vmax:         96.60 cm/s LVOT Vmean:        60.000 cm/s LVOT VTI:          0.177 m LVOT/AV VTI ratio: 1.14  AORTA Ao Root diam: 3.60 cm  MITRAL VALVE               TRICUSPID VALVE MV Area (PHT): 4.24 cm    TR Peak grad:   25.4 mmHg MV Decel Time: 179 msec    TR Vmax:        252.00 cm/s MV E velocity: 63.80 cm/s MV A velocity: 96.00 cm/s  SHUNTS MV E/A ratio:  0.66        Systemic VTI:  0.18 m Systemic Diam: 2.30 cm  Kathlyn Sacramento MD Electronically signed by Kathlyn Sacramento MD Signature Date/Time: 01/17/2021/5:27:49 PM    Final              History of Present Illness:   RIVERS HAMRICK is a 81 y.o. male with the above problem list.  He was last seen by Dr. Tamala Julian in January 2023.      He went to urgent care on 10/26 and emergency room on 10/28 with dizziness.  Orthostatic vital signs on October 26 were somewhat positive.  His blood pressure dropped from 155/90>>133/93 from lying to sitting.  He did not have a significant increase in heart rate.  His orthostatic vital signs were normal on October 28.  His high-sensitivity troponin on October 26 was normal x1.  BMET, CBC, UA was unremarkable.  Head CT demonstrated no acute abnormality.  Chest x-ray demonstrated no acute abnormality.  There was  evidence of hyperinflation/COPD.  Brain MRI was unremarkable.  EKG demonstrated anteroseptal infarct, no acute ST-T wave changes, normal sinus rhythm, PVC (no significant change since prior tracing).  The patient notes that he had a fairly sudden onset of symptoms.  This occurred several days after getting the COVID-19 vaccine in addition to the flu vaccine.  He typically has symptoms that occur sometime after waking up in the morning.  It is worse at this time.  It gradually improves throughout the day.  He has not had a spinning sensation, near syncope or syncope.  He mainly feels lightheaded.  He is concerned about it and it seems to be causing some anxiety.  He has not had chest discomfort.  He feels that he may be breathing heavier but is not specifically short of breath with exertion.  He has not fallen.  The symptoms are fairly constant.  As noted, in the evenings it improves.  He has never had these symptoms before.  When he was seen in June 2022, we added metoprolol succinate to help cover for anginal symptoms until we got his stress test back.  He was concerned about low blood pressures at that time and  we stopped his quinapril.  This was never resumed.  His blood pressures have gradually increased since that time.  In reviewing his chart, his blood pressures in the emergency room and urgent care were significantly elevated.    Past Medical History:  Diagnosis Date   Adenomatous polyp    Carpal tunnel syndrome on left    Cervical spine disease    Coronary artery disease    S/p anterior MI tx with PCI in 1998  //  Myoview 6/22: EF 45, ant and septal scar; no ischemia; intermediate risk // Echocardiogram 7/22: EF 45-50, ant-sept and apical HK, Gr 1 DD, normal RVSF, mild MR   Hyperlipidemia    Hypertension    Low back pain    Myocardial infarction ([place]) 1998   Sleep apnea    CPAP   Wears dentures    full upper   Current Medications: Current Meds  Medication Sig   acetaminophen (TYLENOL)  325 MG tablet Take 325 mg by mouth daily as needed.   aspirin 81 MG tablet Take 81 mg by mouth daily.     atorvastatin (LIPITOR) 40 MG tablet Take 40 mg by mouth daily.     Coenzyme Q10 (COQ10) 100 MG CAPS Take 1 capsule by mouth daily.     influenza vaccine adjuvanted (FLUAD QUADRIVALENT) 0.5 ML injection Inject into the muscle.   latanoprost (XALATAN) 0.005 % ophthalmic solution Place 1 drop into the right eye at bedtime.   metoprolol succinate (TOPROL-XL) 25 MG 24 hr tablet TAKE 1 TABLET(25 MG) BY MOUTH DAILY   Multiple Vitamin (MULTIVITAMIN PO) Take 1 tablet by mouth daily.   nitroGLYCERIN (NITROSTAT) 0.4 MG SL tablet Place 0.4 mg under the tongue every 5 (five) minutes as needed for chest pain.   oxyCODONE-acetaminophen (PERCOCET) 10-325 MG tablet Take one to two tablets by mouth every 4 to 6 hours as needed for pain.   quinapril (ACCUPRIL) 10 MG tablet TAKE 1/2 TABLET BY MOUTH DAILY FOR 1 WEEK THEN INCREASE TO 1 TABLET BY MOUTH DAILY    Allergies:   Patient has no known allergies.   Social History   Tobacco Use   Smoking status: Every Day    Packs/day: 0.50    Years: 60.00    Total pack years: 30.00    Types: Cigarettes   Smokeless tobacco: Never   Tobacco comments:    1/2 3/4 ppd (since age 54)  35 Use   Vaping Use: Never used  Substance Use Topics   Alcohol use: Not Currently    Alcohol/week: 0.0 standard drinks of alcohol    Comment: occasionally   Drug use: No    Family Hx: The patient's family history includes Coronary artery disease in his brother; Heart failure in his mother; Hypertension in his father; Kidney disease in his father.  Review of Systems  Constitutional: Positive for weight loss. Negative for fever.  Gastrointestinal:  Negative for hematemesis.  Genitourinary:  Negative for hematuria.     EKGs/Labs/Other Test Reviewed:    EKG:  EKG is  ordered today.  The ekg ordered today demonstrates NSR, HR 62, left axis deviation, anteroseptal Q waves, no  ST-T wave changes, QTc 432  Recent Labs: 05/16/2022: BUN 14; Creatinine, Ser 0.93; Hemoglobin 15.0; Platelets 250; Potassium 4.3; Sodium 137   Recent Lipid Panel No results for input(s): "CHOL", "TRIG", "HDL", "VLDL", "LDLCALC", "LDLDIRECT" in the last 8760 hours.   Risk Assessment/Calculations/Metrics:         HYPERTENSION CONTROL Vitals:  05/19/22 1409 05/19/22 1736  BP: (!) 150/80 (!) 134/90    The patient's blood pressure is elevated above target today.  In order to address the patient's elevated BP: A new medication was prescribed today.       Physical Exam:    VS:  BP (!) 134/90   Pulse 74   Ht '5\' 10"'$  (1.778 m)   Wt 159 lb 6.4 oz (72.3 kg)   SpO2 94%   BMI 22.87 kg/m     Wt Readings from Last 3 Encounters:  05/19/22 159 lb 6.4 oz (72.3 kg)  05/14/22 161 lb (73 kg)  08/06/21 167 lb (75.8 kg)    Constitutional:      Appearance: Healthy appearance. Not in distress.  Neck:     Vascular: JVD normal.  Pulmonary:     Effort: Pulmonary effort is normal.     Breath sounds: No wheezing. No rales.  Cardiovascular:     Normal rate. Irregular rhythm. Normal S1. Normal S2.      Murmurs: There is no murmur.  Edema:    Peripheral edema absent.  Abdominal:     Palpations: Abdomen is soft.  Skin:    General: Skin is warm and dry.  Neurological:     General: No focal deficit present.     Mental Status: Alert and oriented to person, place and time.         ASSESSMENT & PLAN:   Dizziness Etiology for his symptoms are not entirely clear.  However, he has had worsening blood pressures since last year.  Symptoms did start sometime after getting the COVID-19 and flu vaccine together.  However, the symptoms have continued.  Therefore, I do not think they are all caused by the vaccination.  I suspect that his symptoms of lightheadedness are mainly related to uncontrolled blood pressure.  He has had a head CT, brain MRI, EKGs and a chest x-ray that were all unremarkable.  Lab  work was also unremarkable.  He did not have any evidence of UTI.  He has a mild reduction in ejection fraction.  It would be beneficial for him to be on a beta-blocker and an ACE inhibitor/ARB.  Of note, he describes significant weight loss.  By our records he has lost about 8 pounds since November.  In any event, his symptoms may likely be noncardiac. Resume quinapril - 5 mg daily x1 week, then 10 mg daily Obtain carotid ultrasound to rule out vertebrobasilar insufficiency Follow-up with primary care soon for weight loss If symptoms do not improve with improved blood pressure, arrange event monitor and referral to neurology Follow-up 6-8 weeks  Shortness of breath He has not specifically had dyspnea with exertion.  However, he does feel like he is breathing heavier at times.  Given his uncontrolled blood pressure and symptoms of shortness of breath, I will set him up for an echocardiogram.  Coronary artery disease involving native coronary artery of native heart with angina pectoris ([place]) History of anterior MI in 1998.  Myoview in June 2022 demonstrated scar but no ischemia.  He is is not had chest discomfort to suggest angina.  His electrocardiogram is unchanged.  Obtain echocardiogram as noted.  Continue aspirin 81 mg daily, Lipitor 40 mg daily, Toprol-XL 25 mg daily.  Ischemic cardiomyopathy EF 45-50 by echocardiogram in July 2022.  Volume status stable.  NYHA II.  He would benefit from at least beta-blocker and ACE/ARB.  Continue Toprol-XL 25 mg daily.  Resume quinapril as outlined above.  Obtain follow-up echocardiogram as outlined above.  Hyperlipidemia LDL 44 November 2022.  Excellent control.  Continue Lipitor 40 mg daily.  Hypertension As noted, blood pressure uncontrolled.  I suspect that this is the main cause for his symptoms.  Continue Toprol-XL 25 mg daily.  Resume quinapril at 5 mg daily for 1 week.  Then increase back to his usual dose of 10 mg daily.  Obtain BMET in 2 weeks.   As noted, if his symptoms of dizziness do not improve with improved blood pressure control, we will need to set him up for an event monitor as well as a referral to neurology.            Dispo:  Return in about 8 weeks (around 07/14/2022) for Follow up after testing w/ Dr. Tamala Julian, or Richardson Dopp, PA-C.   Medication Adjustments/Labs and Tests Ordered: Current medicines are reviewed at length with the patient today.  Concerns regarding medicines are outlined above.  Tests Ordered: Orders Placed This Encounter  Procedures   Basic metabolic panel   EKG [id number]   ECHOCARDIOGRAM COMPLETE   VAS US CAROTID   Medication Changes: Meds ordered this encounter  Medications   quinapril (ACCUPRIL) 10 MG tablet    Sig: TAKE 1/2 TABLET BY MOUTH DAILY FOR 1 WEEK THEN INCREASE TO 1 TABLET BY MOUTH DAILY    Dispense:  90 tablet    Refill:  3   Signed, Richardson Dopp, PA-C  05/19/2022 5:47 PM    [address], [place], [place]  [address] Phone: [phone]; Fax: [phone]

## 2022-05-19 NOTE — Patient Instructions (Signed)
Medication Instructions:  Your physician has recommended you make the following change in your medication:   START Quinapril 10 mg taking 1/2 tablet daily for 1 week then increasing to 1 tablet daily after that  *If you need a refill on your cardiac medications before your next appointment, please call your pharmacy*   Lab Work: 2 WEEKS:  BMET   If you have labs (blood work) drawn today and your tests are completely normal, you will receive your results only by: [place] (if you have MyChart) OR A paper copy in the mail If you have any lab test that is abnormal or we need to change your treatment, we will call you to review the results.   Testing/Procedures: Your physician has requested that you have an echocardiogram. Echocardiography is a painless test that uses sound waves to create images of your heart. It provides your doctor with information about the size and shape of your heart and how well your heart's chambers and valves are working. This procedure takes approximately one hour. There are no restrictions for this procedure. Please do NOT wear cologne, perfume, aftershave, or lotions (deodorant is allowed). Please arrive 15 minutes prior to your appointment time.   Your physician has requested that you have a carotid duplex. This test is an ultrasound of the carotid arteries in your neck. It looks at blood flow through these arteries that supply the brain with blood. Allow one hour for this exam. There are no restrictions or special instructions.    Follow-Up: At [facility], you and your health needs are our priority.  As part of our continuing mission to provide you with exceptional heart care, we have created designated Provider Care Teams.  These Care Teams include your primary Cardiologist (physician) and Advanced Practice Providers (APPs -  Physician Assistants and Nurse Practitioners) who all work together to provide you with the care you need, when you need  it.  We recommend signing up for the patient portal called "MyChart".  Sign up information is provided on this After Visit Summary.  MyChart is used to connect with patients for Virtual Visits (Telemedicine).  Patients are able to view lab/test results, encounter notes, upcoming appointments, etc.  Non-urgent messages can be sent to your provider as well.   To learn more about what you can do with MyChart, go to [email or web address].    Your next appointment:   6-8 week(s)  The format for your next appointment:   In Person  Provider:   Sinclair Grooms, MD     Other Instructions See your Primary Care Physician about your weight loss.  Call if your symptoms aren't better after 1 week   Your physician has requested that you regularly monitor and record your blood pressure readings at home. Please use the same machine at the same time of day to check your readings and record them to bring to your follow-up visit.   Please monitor blood pressures and keep a log of your readings for 2 weeks then send them via mychart.    Make sure to check 2 hours after your medications.    AVOID these things for 30 minutes before checking your blood pressure: No Drinking caffeine. No Drinking alcohol. No Eating. No Smoking. No Exercising.   Five minutes before checking your blood pressure: Pee. Sit in a dining chair. Avoid sitting in a soft couch or armchair. Be quiet. Do not talk   Important Information About Sugar

## 2022-05-19 NOTE — Assessment & Plan Note (Signed)
As noted, blood pressure uncontrolled.  I suspect that this is the main cause for his symptoms.  Continue Toprol-XL 25 mg daily.  Resume quinapril at 5 mg daily for 1 week.  Then increase back to his usual dose of 10 mg daily.  Obtain BMET in 2 weeks.  As noted, if his symptoms of dizziness do not improve with improved blood pressure control, we will need to set him up for an event monitor as well as a referral to neurology.

## 2022-05-19 NOTE — Assessment & Plan Note (Signed)
He has not specifically had dyspnea with exertion.  However, he does feel like he is breathing heavier at times.  Given his uncontrolled blood pressure and symptoms of shortness of breath, I will set him up for an echocardiogram.

## 2022-05-19 NOTE — Assessment & Plan Note (Signed)
Etiology for his symptoms are not entirely clear.  However, he has had worsening blood pressures since last year.  Symptoms did start sometime after getting the COVID-19 and flu vaccine together.  However, the symptoms have continued.  Therefore, I do not think they are all caused by the vaccination.  I suspect that his symptoms of lightheadedness are mainly related to uncontrolled blood pressure.  He has had a head CT, brain MRI, EKGs and a chest x-ray that were all unremarkable.  Lab work was also unremarkable.  He did not have any evidence of UTI.  He has a mild reduction in ejection fraction.  It would be beneficial for him to be on a beta-blocker and an ACE inhibitor/ARB.  Of note, he describes significant weight loss.  By our records he has lost about 8 pounds since November.  In any event, his symptoms may likely be noncardiac. Resume quinapril - 5 mg daily x1 week, then 10 mg daily Obtain carotid ultrasound to rule out vertebrobasilar insufficiency Follow-up with primary care soon for weight loss If symptoms do not improve with improved blood pressure, arrange event monitor and referral to neurology Follow-up 6-8 weeks

## 2022-05-19 NOTE — Assessment & Plan Note (Signed)
LDL 44 November 2022.  Excellent control.  Continue Lipitor 40 mg daily.

## 2022-05-20 DIAGNOSIS — R634 Abnormal weight loss: Secondary | ICD-10-CM | POA: Diagnosis not present

## 2022-05-20 DIAGNOSIS — J439 Emphysema, unspecified: Secondary | ICD-10-CM | POA: Diagnosis not present

## 2022-05-21 ENCOUNTER — Encounter: Payer: Self-pay | Admitting: Physician Assistant

## 2022-05-21 ENCOUNTER — Other Ambulatory Visit: Payer: Self-pay | Admitting: *Deleted

## 2022-05-21 DIAGNOSIS — R42 Dizziness and giddiness: Secondary | ICD-10-CM

## 2022-05-21 NOTE — Progress Notes (Signed)
New order for carotid US placed so that patient may have the study at [facility].

## 2022-05-27 DIAGNOSIS — E559 Vitamin D deficiency, unspecified: Secondary | ICD-10-CM | POA: Diagnosis not present

## 2022-05-27 DIAGNOSIS — M5416 Radiculopathy, lumbar region: Secondary | ICD-10-CM | POA: Diagnosis not present

## 2022-05-27 DIAGNOSIS — G629 Polyneuropathy, unspecified: Secondary | ICD-10-CM | POA: Diagnosis not present

## 2022-05-27 DIAGNOSIS — F17210 Nicotine dependence, cigarettes, uncomplicated: Secondary | ICD-10-CM | POA: Diagnosis not present

## 2022-05-27 DIAGNOSIS — J449 Chronic obstructive pulmonary disease, unspecified: Secondary | ICD-10-CM | POA: Diagnosis not present

## 2022-05-27 DIAGNOSIS — R42 Dizziness and giddiness: Secondary | ICD-10-CM | POA: Diagnosis not present

## 2022-05-28 ENCOUNTER — Telehealth: Payer: Self-pay | Admitting: Physician Assistant

## 2022-05-28 MED ORDER — LISINOPRIL 10 MG PO TABS: 10.0000 mg | ORAL_TABLET | Freq: Every day | ORAL | 3 refills | Status: DC

## 2022-05-28 NOTE — Telephone Encounter (Signed)
  Patient had been using quinapril 5 mg for last 7 days from previously filled rx and ready to switch to 10 mg daily dose as per the original rx ordered by Richardson Dopp PA-C but now can't get Quinapril filled due to supply issue. So switch it to Lisinopril 10 mg daily   Lisinopril 10 mg 3 months RX3 sent to the patient's preferred pharmacy

## 2022-05-28 NOTE — Telephone Encounter (Signed)
Pt c/o medication issue:  1. Name of Medication:   quinapril (ACCUPRIL) 10 MG tablet   2. How are you currently taking this medication (dosage and times per day)?   3. Are you having a reaction (difficulty breathing--STAT)?   4. What is your medication issue? Pt has sent two mychart messages on this matter. Pt calling back concerned because this medication is discontinued everywhere and he is unsure on whether he should keep taking what he has or not.

## 2022-05-28 NOTE — Telephone Encounter (Signed)
Called pt in regards to Brian [name] ordered by Richardson Dopp, PA reports can not find medication was told it was discontinued.  Advised pt we have received  my chart messages however Nicki Reaper has not responded yet.  Advised pt will ask pharmacist if they have an alternative medication pt can take.

## 2022-05-28 NOTE — Telephone Encounter (Signed)
These BPs look pretty good. Confirm with patient if he has been taking quinapril 10 mg once daily. If so we can change it to Lisinopril 5 mg once daily. This should be fairly equivalent. Continue to monitor BPs and send some readings in 2 weeks. Richardson Dopp, PA-C    05/28/2022 5:13 PM

## 2022-06-02 ENCOUNTER — Ambulatory Visit
Admission: RE | Admit: 2022-06-02 | Discharge: 2022-06-02 | Disposition: A | Discharge status: Home / Self Care | Payer: Medicare Other | Source: Ambulatory Visit | Attending: Physician Assistant | Admitting: Physician Assistant

## 2022-06-02 ENCOUNTER — Ambulatory Visit (HOSPITAL_BASED_OUTPATIENT_CLINIC_OR_DEPARTMENT_OTHER)
Admission: RE | Admit: 2022-06-02 | Discharge: 2022-06-02 | Disposition: A | Discharge status: Home / Self Care | Payer: Medicare Other | Source: Ambulatory Visit | Attending: Physician Assistant | Admitting: Physician Assistant

## 2022-06-02 DIAGNOSIS — I25119 Atherosclerotic heart disease of native coronary artery with unspecified angina pectoris: Secondary | ICD-10-CM | POA: Insufficient documentation

## 2022-06-02 DIAGNOSIS — R0602 Shortness of breath: Secondary | ICD-10-CM | POA: Insufficient documentation

## 2022-06-02 DIAGNOSIS — I255 Ischemic cardiomyopathy: Secondary | ICD-10-CM | POA: Insufficient documentation

## 2022-06-02 DIAGNOSIS — I10 Essential (primary) hypertension: Secondary | ICD-10-CM | POA: Insufficient documentation

## 2022-06-02 DIAGNOSIS — E785 Hyperlipidemia, unspecified: Secondary | ICD-10-CM | POA: Diagnosis not present

## 2022-06-02 DIAGNOSIS — R42 Dizziness and giddiness: Secondary | ICD-10-CM | POA: Insufficient documentation

## 2022-06-02 DIAGNOSIS — I252 Old myocardial infarction: Secondary | ICD-10-CM | POA: Insufficient documentation

## 2022-06-02 DIAGNOSIS — I6523 Occlusion and stenosis of bilateral carotid arteries: Secondary | ICD-10-CM | POA: Diagnosis not present

## 2022-06-02 LAB — ECHOCARDIOGRAM COMPLETE
AR max vel: 3.09 cm2
AV Area VTI: 3.5 cm2
AV Area mean vel: 3.18 cm2
AV Mean grad: 3 mmHg
AV Peak grad: 5 mmHg
Ao pk vel: 1.12 m/s
Area-P 1/2: 2.25 cm2
S' Lateral: 3.7 cm

## 2022-06-02 MED ORDER — PERFLUTREN LIPID MICROSPHERE
1.0000 mL | INTRAVENOUS | Status: AC | PRN
  Administered 2022-06-02: 2 mL via INTRAVENOUS

## 2022-06-02 NOTE — Progress Notes (Signed)
*  PRELIMINARY RESULTS* Echocardiogram 2D Echocardiogram has been performed.  Sherrie Sport 06/02/2022, 9:50 AM

## 2022-06-03 ENCOUNTER — Other Ambulatory Visit
Admission: RE | Admit: 2022-06-03 | Discharge: 2022-06-03 | Disposition: A | Discharge status: Home / Self Care | Payer: Medicare Other | Source: Ambulatory Visit | Attending: Physician Assistant | Admitting: Physician Assistant

## 2022-06-03 ENCOUNTER — Encounter: Payer: Self-pay | Admitting: Physician Assistant

## 2022-06-03 DIAGNOSIS — R42 Dizziness and giddiness: Secondary | ICD-10-CM | POA: Diagnosis not present

## 2022-06-03 DIAGNOSIS — I25119 Atherosclerotic heart disease of native coronary artery with unspecified angina pectoris: Secondary | ICD-10-CM | POA: Diagnosis not present

## 2022-06-03 DIAGNOSIS — H401410 Capsular glaucoma with pseudoexfoliation of lens, right eye, stage unspecified: Secondary | ICD-10-CM | POA: Diagnosis not present

## 2022-06-03 LAB — BASIC METABOLIC PANEL
Anion gap: 8 (ref 5–15)
BUN: 14 mg/dL (ref 8–23)
CO2: 22 mmol/L (ref 22–32)
Calcium: 9 mg/dL (ref 8.9–10.3)
Chloride: 106 mmol/L (ref 98–111)
Creatinine, Ser: 0.86 mg/dL (ref 0.61–1.24)
GFR, Estimated: >60 mL/min (ref >60)
Glucose, Bld: 86 mg/dL (ref 70–99)
Potassium: 4.1 mmol/L (ref 3.5–5.1)
Sodium: 136 mmol/L (ref 135–145)

## 2022-06-05 DIAGNOSIS — I10 Essential (primary) hypertension: Secondary | ICD-10-CM | POA: Diagnosis not present

## 2022-06-05 DIAGNOSIS — I712 Thoracic aortic aneurysm, without rupture, unspecified: Secondary | ICD-10-CM | POA: Diagnosis not present

## 2022-06-05 DIAGNOSIS — I2510 Atherosclerotic heart disease of native coronary artery without angina pectoris: Secondary | ICD-10-CM | POA: Diagnosis not present

## 2022-06-05 DIAGNOSIS — Z79899 Other long term (current) drug therapy: Secondary | ICD-10-CM | POA: Diagnosis not present

## 2022-06-05 DIAGNOSIS — R911 Solitary pulmonary nodule: Secondary | ICD-10-CM | POA: Diagnosis not present

## 2022-06-05 DIAGNOSIS — Z0000 Encounter for general adult medical examination without abnormal findings: Secondary | ICD-10-CM | POA: Diagnosis not present

## 2022-06-05 DIAGNOSIS — E7800 Pure hypercholesterolemia, unspecified: Secondary | ICD-10-CM | POA: Diagnosis not present

## 2022-06-05 DIAGNOSIS — G8929 Other chronic pain: Secondary | ICD-10-CM | POA: Diagnosis not present

## 2022-06-05 DIAGNOSIS — M545 Low back pain, unspecified: Secondary | ICD-10-CM | POA: Diagnosis not present

## 2022-06-05 DIAGNOSIS — F17210 Nicotine dependence, cigarettes, uncomplicated: Secondary | ICD-10-CM | POA: Diagnosis not present

## 2022-06-05 DIAGNOSIS — G4730 Sleep apnea, unspecified: Secondary | ICD-10-CM | POA: Diagnosis not present

## 2022-06-09 ENCOUNTER — Other Ambulatory Visit: Payer: Self-pay | Admitting: Internal Medicine

## 2022-06-09 DIAGNOSIS — I712 Thoracic aortic aneurysm, without rupture, unspecified: Secondary | ICD-10-CM

## 2022-06-17 ENCOUNTER — Ambulatory Visit
Admission: RE | Admit: 2022-06-17 | Discharge: 2022-06-17 | Disposition: A | Discharge status: Home / Self Care | Payer: Medicare Other | Source: Ambulatory Visit | Attending: Internal Medicine | Admitting: Internal Medicine

## 2022-06-17 ENCOUNTER — Other Ambulatory Visit: Payer: Self-pay | Admitting: Internal Medicine

## 2022-06-17 DIAGNOSIS — I7121 Aneurysm of the ascending aorta, without rupture: Secondary | ICD-10-CM | POA: Diagnosis not present

## 2022-06-17 DIAGNOSIS — M545 Low back pain, unspecified: Secondary | ICD-10-CM | POA: Insufficient documentation

## 2022-06-17 DIAGNOSIS — M47816 Spondylosis without myelopathy or radiculopathy, lumbar region: Secondary | ICD-10-CM | POA: Diagnosis not present

## 2022-06-17 DIAGNOSIS — I712 Thoracic aortic aneurysm, without rupture, unspecified: Secondary | ICD-10-CM | POA: Insufficient documentation

## 2022-06-17 DIAGNOSIS — M4126 Other idiopathic scoliosis, lumbar region: Secondary | ICD-10-CM | POA: Diagnosis not present

## 2022-06-17 DIAGNOSIS — I2510 Atherosclerotic heart disease of native coronary artery without angina pectoris: Secondary | ICD-10-CM | POA: Diagnosis not present

## 2022-06-17 MED ORDER — IOHEXOL 350 MG/ML SOLN
75.0000 mL | Freq: Once | INTRAVENOUS | Status: AC | PRN
  Administered 2022-06-17: 75 mL via INTRAVENOUS

## 2022-06-24 ENCOUNTER — Encounter: Payer: Self-pay | Admitting: Gastroenterology

## 2022-06-24 ENCOUNTER — Ambulatory Visit: Payer: Medicare Other | Admitting: Gastroenterology

## 2022-06-24 VITALS — BP 123/83 | HR 75 | Temp 97.6°F | Ht 69.0 in | Wt 154.0 lb

## 2022-06-24 DIAGNOSIS — R1032 Left lower quadrant pain: Secondary | ICD-10-CM

## 2022-06-24 MED ORDER — METRONIDAZOLE 500 MG PO TABS: 500.0000 mg | ORAL_TABLET | Freq: Two times a day (BID) | ORAL | 0 refills | Status: DC

## 2022-06-24 MED ORDER — CIPROFLOXACIN HCL 500 MG PO TABS: 500.0000 mg | ORAL_TABLET | Freq: Two times a day (BID) | ORAL | 0 refills | Status: DC

## 2022-06-24 NOTE — Progress Notes (Unsigned)
Gastroenterology Consultation  Referring Provider:     [name], * Primary Care Physician:  [name], MD Primary Gastroenterologist:  Dr. Allen Norris     Reason for Consultation:     Abdominal pain        HPI:   Brian [name] is a 81 y.o. y/o male referred for consultation & management of abdominal pain by Dr. Koleen Nimrod, Walker Shadow, MD. This patient comes to see me after having a colonoscopy back in November 2020 by me with polyps found at that time.  The patient had a history of adenomatous polyps prior to that colonoscopy.  The patient also had been diagnosed in the past with ischemic colitis and had been to the emergency department with rectal bleeding precipitating that last colonoscopy.  The patient was in the emergency department recently and had a CT scan that showed:  IMPRESSION: 1. Stable uncomplicated fusiform aneurysmal dilatation of the ascending thoracic aorta measuring 42 mm in diameter, unchanged since the 07/2015 examination. 2. Suspected hemodynamically significant narrowing involving the origin and proximal aspect of the left subclavian artery. Correlation for symptoms of subclavian steal syndrome is advised (asymmetry between the bilateral upper extremity blood pressure measurements), though preserved antegrade flow was demonstrated within the left vertebral artery on recent carotid Doppler ultrasound performed 06/02/2022. 3. Apparent dilatation of the common bile duct, the etiology which is not depicted on this examination. Additionally, there is progressive nodularity of the hepatic contour as can be seen in the setting of cirrhotic change. Correlation with LFTs is advised. Further evaluation with abdominal ultrasound and/or CT could be performed as indicated. 4. Extensive coronary artery calcifications. 5. Aortic Atherosclerosis  The patient's most recent liver enzymes were back in 2022 and were normal.  The patient reports that he had  a prescription for Flagyl and ciprofloxacin due to his history of diverticulitis and he started having some pain the other day so he went to have the prescription filled but the prescriptions were outdated.  He then states that the pain completely resolved.  The patient has changed his diet somewhat but feels that he is losing weight more than he would expect to have lost.  Past Medical History:  Diagnosis Date   Adenomatous polyp    Carpal tunnel syndrome on left    Cervical spine disease    Coronary artery disease    S/p anterior MI tx with PCI in 1998  //  Myoview 6/22: EF 45, ant and septal scar; no ischemia; intermediate risk // Echocardiogram 7/22: EF 45-50, ant-sept and apical HK, Gr 1 DD, normal RVSF, mild MR   Hyperlipidemia    Hypertension    Ischemic cardiomyopathy 05/19/2022   Echocardiogram 06/02/22: EF 45-50, global HK, Gr 1 DD, NL RVSF, NL PASP (RVSP 26.4), trivial MR, RAP 3   Low back pain    Myocardial infarction ([place]) 1998   Sleep apnea    CPAP   Wears dentures    full upper    Past Surgical History:  Procedure Laterality Date   APPENDECTOMY     bunions     CATARACT EXTRACTION     COLONOSCOPY WITH PROPOFOL N/A 02/19/2015   Procedure: COLONOSCOPY WITH PROPOFOL;  Surgeon: Lucilla Lame, MD;  Location: [facility];  Service: Endoscopy;  Laterality: N/A;   COLONOSCOPY WITH PROPOFOL N/A 06/05/2019   Procedure: COLONOSCOPY WITH BIOPSY;  Surgeon: Lucilla Lame, MD;  Location: [facility];  Service: Endoscopy;  Laterality: N/A;  sleep apnea  HEMORRHOID BANDING     HERNIA REPAIR     lumbar disc protrussion  03/2010   POLYPECTOMY N/A 06/05/2019   Procedure: POLYPECTOMY;  Surgeon: Lucilla Lame, MD;  Location: [facility];  Service: Endoscopy;  Laterality: N/A;    Prior to Admission medications   Medication Sig Start Date End Date Taking? Authorizing Provider  acetaminophen (TYLENOL) 325 MG tablet Take 325 mg by mouth daily as needed.    [provider]  aspirin 81 MG tablet Take 81 mg by mouth daily.      [provider]  atorvastatin (LIPITOR) 40 MG tablet Take 40 mg by mouth daily.      [provider]  Coenzyme Q10 (COQ10) 100 MG CAPS Take 1 capsule by mouth daily.      [provider]  influenza vaccine adjuvanted (FLUAD QUADRIVALENT) 0.5 ML injection Inject into the muscle. 04/25/21   Carlyle Basques, MD  latanoprost (XALATAN) 0.005 % ophthalmic solution Place 1 drop into the right eye at bedtime. 03/04/22   [provider]  lisinopril (ZESTRIL) 10 MG tablet Take 1 tablet (10 mg total) by mouth daily. 05/28/22   Belva Crome, MD  metoprolol succinate (TOPROL-XL) 25 MG 24 hr tablet TAKE 1 TABLET(25 MG) BY MOUTH DAILY 03/24/22   Belva Crome, MD  Multiple Vitamin (MULTIVITAMIN PO) Take 1 tablet by mouth daily.    [provider]  nitroGLYCERIN (NITROSTAT) 0.4 MG SL tablet Place 0.4 mg under the tongue every 5 (five) minutes as needed for chest pain.    [provider]  oxyCODONE-acetaminophen (PERCOCET) 10-325 MG tablet Take one to two tablets by mouth every 4 to 6 hours as needed for pain.    [provider]    Family History  Problem Relation Age of Onset   Kidney disease Father    Hypertension Father    Heart failure Mother    Coronary artery disease Brother      Social History   Tobacco Use   Smoking status: Every Day    Packs/day: 0.50    Years: 60.00    Total pack years: 30.00    Types: Cigarettes   Smokeless tobacco: Never   Tobacco comments:    1/2 3/4 ppd (since age 73)  Vaping Use   Vaping Use: Never used  Substance Use Topics   Alcohol use: Not Currently    Alcohol/week: 0.0 standard drinks of alcohol    Comment: occasionally   Drug use: No    Allergies as of 06/24/2022   (No Known Allergies)    Review of Systems:    All systems reviewed and negative except where noted in HPI.   Physical Exam:  There were no vitals taken for  this visit. No LMP for male patient. General:   Alert,  Well-developed, well-nourished, pleasant and cooperative in NAD Head:  Normocephalic and atraumatic. Eyes:  Sclera clear, no icterus.   Conjunctiva pink. Ears:  Normal auditory acuity. Skin:  Intact without significant lesions or rashes.  No jaundice. Lymph Nodes:  No significant cervical adenopathy. Psych:  Alert and cooperative. Normal mood and affect.  Imaging Studies: DG Lumbar Spine 2-3 Views  Result Date: 06/18/2022 CLINICAL DATA:  Pain across lower back for 8 years gradually worsening recently EXAM: LUMBAR SPINE - 2-3 VIEW COMPARISON:  MRI lumbar spine 03/23/2015 FINDINGS: Right convex lumbar scoliosis. Vertebral body height is grossly maintained though demineralization limits evaluation for subtle fractures. Multilevel advanced spondylosis and disc space height  loss greatest at the apex of the lumbar curvature at L2-L3 and L3-L4. There is near-complete loss of disc space on the left at these levels. Moderate to advanced lower lumbar facet arthropathy. Aortic athero sclerotic calcification. IMPRESSION: Scoliosis with advanced disc disease at L2-L3 and L3-L4 greater on the left. Electronically Signed   By: Placido Sou M.D.   On: 06/18/2022 21:38   CT ANGIO CHEST AORTA W/CM & OR WO/CM  Result Date: 06/17/2022 CLINICAL DATA:  Anxiety symptoms. Evaluate thoracic aortic aneurysm. EXAM: CT ANGIOGRAPHY CHEST WITH CONTRAST TECHNIQUE: Multidetector CT imaging of the chest was performed using the standard protocol during bolus administration of intravenous contrast. Multiplanar CT image reconstructions and MIPs were obtained to evaluate the vascular anatomy. RADIATION DOSE REDUCTION: This exam was performed according to the departmental dose-optimization program which includes automated exposure control, adjustment of the mA and/or kV according to patient size and/or use of iterative reconstruction technique. CONTRAST:  68m OMNIPAQUE  IOHEXOL 350 MG/ML SOLN COMPARISON:  06/27/2021; 07/05/2020; 06/09/2019 CT abdomen and pelvis-02/15/2016 Carotid Doppler ultrasound - 06/02/2022 FINDINGS: Vascular Findings: Stable fusiform aneurysmal dilatation of the ascending thoracic aorta with measurements as follows. The thoracic aorta tapers to a normal caliber at the level of the aortic arch. No evidence of thoracic aortic dissection or perivascular stranding on this non gated examination. Atherosclerotic plaque within the aortic arch and descending thoracic aorta, not resulting in a hemodynamically significant stenosis. Conventional configuration of the aortic arch. There is a large amount of eccentric calcified atherosclerotic plaque involving the origin and proximal aspect of the left subclavian artery resulting in at least 50% luminal narrowing (image 41, series 4). Normal heart size. Extensive coronary artery calcifications. No pericardial effusion. Although this examination was not tailored for the evaluation the pulmonary arteries, there are no discrete filling defects within the central pulmonary arterial tree to suggest central pulmonary embolism. Normal caliber of the main pulmonary artery. ------------------------------------------------------------- Thoracic aortic measurements: SINOTUBULAR JUNCTION: 35 mm as measured in greatest oblique short axis coronal dimension. PROXIMAL ASCENDING THORACIC AORTA: 42 mm as measured in greatest oblique short axis axial dimension (axial image 79, series 4) at the level of the main pulmonary artery and approximately 41 mm as measured in greatest oblique short axis coronal dimension (coronal image 60, series 7), similar to the 07/2015 examination. AORTIC ARCH: 27 mm as measured in greatest oblique short axis sagittal dimension. PROXIMAL DESCENDING THORACIC AORTA: 29 mm as measured in greatest oblique short axis axial dimension at the level of the main pulmonary artery. DISTAL DESCENDING THORACIC AORTA: 28 mm as  measured in greatest oblique short axis axial dimension at the level of the diaphragmatic hiatus. Review of the MIP images confirms the above findings. ------------------------------------------------------------- Non-Vascular Findings: Mediastinum/Lymph Nodes: Mildly prominent AP window lymph node measures 0.9 cm in diameter (image 60, series 4, similar to at least the 05/2019 examination and thus favored to be reactive in etiology. No bulky mediastinal, hilar or axillary lymphadenopathy. Lungs/Pleura: Minimal dependent subpleural ground-glass atelectasis, right-greater-than-left. Minimal grossly symmetric biapical pleural-parenchymal thickening. No discrete focal airspace opacities. Moderate apical predominant centrilobular emphysematous change. The central airways appear widely patent. No pleural effusion or pneumothorax. No discrete pulmonary nodules. Upper abdomen: Limited early arterial phase evaluation of the upper abdomen demonstrates dilatation of the common bile duct, measuring approximately 1.2 cm in greatest oblique short axis coronal diameter (coronal image 68, series 7), the etiology of which is not depicted on this examination. Suspected mild centralized intrahepatic biliary ductal dilatation. Mild nodularity of  the hepatic contour with potential atrophy of the medial segment of left lobe liver, progressed compared to remote abdominal CT performed in 2017. Musculoskeletal: Mild scoliotic curvature of the thoracolumbar spine. Moderate to severe multilevel lumbar spine DDD, incompletely evaluated. Regional soft tissues appear normal. IMPRESSION: 1. Stable uncomplicated fusiform aneurysmal dilatation of the ascending thoracic aorta measuring 42 mm in diameter, unchanged since the 07/2015 examination. 2. Suspected hemodynamically significant narrowing involving the origin and proximal aspect of the left subclavian artery. Correlation for symptoms of subclavian steal syndrome is advised (asymmetry between  the bilateral upper extremity blood pressure measurements), though preserved antegrade flow was demonstrated within the left vertebral artery on recent carotid Doppler ultrasound performed 06/02/2022. 3. Apparent dilatation of the common bile duct, the etiology which is not depicted on this examination. Additionally, there is progressive nodularity of the hepatic contour as can be seen in the setting of cirrhotic change. Correlation with LFTs is advised. Further evaluation with abdominal ultrasound and/or CT could be performed as indicated. 4. Extensive coronary artery calcifications. 5. Aortic Atherosclerosis (ICD10-I70.0) and Emphysema (ICD10-J43.9). Electronically Signed   By: Sandi Mariscal M.D.   On: 06/17/2022 15:35   ECHOCARDIOGRAM COMPLETE  Result Date: 06/02/2022    ECHOCARDIOGRAM REPORT   Patient Name:   Brian [name] Date of Exam: 06/02/2022 Medical Rec #:  [id number]       Height:       70.0 in Accession #:    [id number]      Weight:       159.4 lb Date of Birth:  1940-09-27      BSA:          1.895 m Patient Age:    89 years        BP:           134/90 mmHg Patient Gender: M               HR:           68 bpm. Exam Location:  [facility] Procedure: 2D Echo, Color Doppler, Cardiac Doppler and Intracardiac            Opacification Agent Indications:     Shortness of breath                  Ischemic cardiomyopathy  History:         Patient has prior history of Echocardiogram examinations, most                  recent 01/17/2021. Previous Myocardial Infarction; Risk                  Factors:Hypertension.  Sonographer:     Sherrie Sport Referring Phys:  [id number] WEAVER Diagnosing Phys: Nelva Bush MD  Sonographer Comments: Suboptimal apical window. IMPRESSIONS  1. Left ventricular ejection fraction, by estimation, is 45 to 50%. The left ventricle has mildly decreased function. The left ventricle demonstrates global hypokinesis. Left ventricular diastolic parameters are consistent with Grade I diastolic  dysfunction (impaired relaxation).  2. Right ventricular systolic function is normal. The right ventricular size is normal. There is normal pulmonary artery systolic pressure.  3. The mitral valve is normal in structure. Trivial mitral valve regurgitation. No evidence of mitral stenosis.  4. The aortic valve is tricuspid. Aortic valve regurgitation is not visualized. No aortic stenosis is present.  5. The inferior vena cava is normal in size with greater than 50% respiratory variability, suggesting right atrial pressure of  3 mmHg. FINDINGS  Left Ventricle: Left ventricular ejection fraction, by estimation, is 45 to 50%. The left ventricle has mildly decreased function. The left ventricle demonstrates global hypokinesis. Definity contrast agent was given IV to delineate the left ventricular  endocardial borders. The left ventricular internal cavity size was normal in size. There is no left ventricular hypertrophy. Left ventricular diastolic parameters are consistent with Grade I diastolic dysfunction (impaired relaxation). Right Ventricle: The right ventricular size is normal. No increase in right ventricular wall thickness. Right ventricular systolic function is normal. There is normal pulmonary artery systolic pressure. The tricuspid regurgitant velocity is 2.42 m/s, and  with an assumed right atrial pressure of 3 mmHg, the estimated right ventricular systolic pressure is 65.9 mmHg. Left Atrium: Left atrial size was normal in size. Right Atrium: Right atrial size was normal in size. Pericardium: There is no evidence of pericardial effusion. Mitral Valve: The mitral valve is normal in structure. Trivial mitral valve regurgitation. No evidence of mitral valve stenosis. Tricuspid Valve: The tricuspid valve is grossly normal. Tricuspid valve regurgitation is mild. Aortic Valve: The aortic valve is tricuspid. Aortic valve regurgitation is not visualized. No aortic stenosis is present. Aortic valve mean gradient measures  3.0 mmHg. Aortic valve peak gradient measures 5.0 mmHg. Aortic valve area, by VTI measures 3.50 cm. Pulmonic Valve: The pulmonic valve was normal in structure. Pulmonic valve regurgitation is mild. No evidence of pulmonic stenosis. Aorta: The aortic root is normal in size and structure. Pulmonary Artery: The pulmonary artery is of normal size. Venous: The inferior vena cava is normal in size with greater than 50% respiratory variability, suggesting right atrial pressure of 3 mmHg. IAS/Shunts: No atrial level shunt detected by color flow Doppler.  LEFT VENTRICLE PLAX 2D LVIDd:         5.20 cm   Diastology LVIDs:         3.70 cm   LV e' medial:    7.40 cm/s LV PW:         0.80 cm   LV E/e' medial:  6.9 LV IVS:        0.70 cm   LV e' lateral:   7.94 cm/s LVOT diam:     2.20 cm   LV E/e' lateral: 6.5 LV SV:         71 LV SV Index:   37 LVOT Area:     3.80 cm  RIGHT VENTRICLE RV Basal diam:  2.40 cm RV Mid diam:    2.10 cm RV S prime:     13.10 cm/s TAPSE (M-mode): 2.7 cm LEFT ATRIUM           Index        RIGHT ATRIUM           Index LA diam:      2.90 cm 1.53 cm/m   RA Area:     15.90 cm LA Vol (A2C): 43.2 ml 22.79 ml/m  RA Volume:   39.40 ml  20.79 ml/m LA Vol (A4C): 19.1 ml 10.08 ml/m  AORTIC VALVE AV Area (Vmax):    3.09 cm AV Area (Vmean):   3.18 cm AV Area (VTI):     3.50 cm AV Vmax:           112.00 cm/s AV Vmean:          77.300 cm/s AV VTI:            0.202 m AV Peak Grad:      5.0  mmHg AV Mean Grad:      3.0 mmHg LVOT Vmax:         91.00 cm/s LVOT Vmean:        64.600 cm/s LVOT VTI:          0.186 m LVOT/AV VTI ratio: 0.92  AORTA Ao Root diam: 3.50 cm MITRAL VALVE               TRICUSPID VALVE MV Area (PHT): 2.25 cm    TR Peak grad:   23.4 mmHg MV Decel Time: 337 msec    TR Vmax:        242.00 cm/s MV E velocity: 51.40 cm/s MV A velocity: 94.90 cm/s  SHUNTS MV E/A ratio:  0.54        Systemic VTI:  0.19 m                            Systemic Diam: 2.20 cm Nelva Bush MD Electronically signed by  Nelva Bush MD Signature Date/Time: 06/02/2022/6:53:22 PM    Final    US Carotid Duplex Bilateral  Result Date: 06/02/2022 CLINICAL DATA:  Dizziness. History of CAD (post myocardial infarction), hypertension, hyperlipidemia and smoking. EXAM: BILATERAL CAROTID DUPLEX ULTRASOUND TECHNIQUE: Pearline Cables scale imaging, color Doppler and duplex ultrasound were performed of bilateral carotid and vertebral arteries in the neck. COMPARISON:  None Available. FINDINGS: Criteria: Quantification of carotid stenosis is based on velocity parameters that correlate the residual internal carotid diameter with NASCET-based stenosis levels, using the diameter of the distal internal carotid lumen as the denominator for stenosis measurement. The following velocity measurements were obtained: RIGHT ICA: 57/19 cm/sec CCA: 59/56 cm/sec SYSTOLIC ICA/CCA RATIO:  1.1 ECA: 81 cm/sec LEFT ICA: 73/29 cm/sec CCA: 38/75 cm/sec SYSTOLIC ICA/CCA RATIO:  1.3 ECA: 45 cm/sec RIGHT CAROTID ARTERY: There is a moderate amount of eccentric echogenic plaque within the right carotid bulb (image 5 and 17), extending to involve the origin and proximal aspects of the right internal carotid artery (image 22), not resulting in elevated peak systolic velocities within the interrogated course of the right internal carotid artery to suggest a hemodynamically significant stenosis. RIGHT VERTEBRAL ARTERY:  Antegrade flow LEFT CAROTID ARTERY: There is a moderate amount of eccentric echogenic plaque within the left carotid bulb (images 48 and 49), not resulting in elevated peak systolic velocities within the interrogated course of the left internal carotid artery to suggest a hemodynamically significant stenosis. LEFT VERTEBRAL ARTERY:  Antegrade flow IMPRESSION: Moderate amount of bilateral atherosclerotic plaque, not resulting in a hemodynamically significant stenosis within either internal carotid artery. Electronically Signed   By: Sandi Mariscal M.D.   On:  06/02/2022 12:38    Assessment and Plan:   Brian [name] is a 81 y.o. y/o male who comes with a history of abdominal discomfort which she thought was a recurrence of his diverticulitis but it resolved on its own within a day.  The patient would like to have a prescription for Cipro and Flagyl so if he gets an attack again he can start the medication.  The patient has a follow-up with cardiology.  The patient's weight loss does not appear to be related to his GI tract since he had no colon pathology to explain this and has no upper GI symptoms.  Patient has been explained the plan and agrees with it    Lucilla Lame, MD. Marval Regal    Note: This dictation was prepared with Dragon dictation  along with smaller phrase technology. Any transcriptional errors that result from this process are unintentional.

## 2022-06-25 ENCOUNTER — Other Ambulatory Visit: Payer: Self-pay | Admitting: Internal Medicine

## 2022-06-25 DIAGNOSIS — R932 Abnormal findings on diagnostic imaging of liver and biliary tract: Secondary | ICD-10-CM

## 2022-06-29 ENCOUNTER — Encounter: Payer: Self-pay | Admitting: Surgery

## 2022-06-29 ENCOUNTER — Ambulatory Visit: Payer: Medicare Other | Admitting: Surgery

## 2022-06-29 VITALS — BP 162/103 | HR 70 | Temp 98.2°F | Resp 20 | Ht 69.0 in | Wt 157.0 lb

## 2022-06-29 DIAGNOSIS — I771 Stricture of artery: Secondary | ICD-10-CM | POA: Diagnosis not present

## 2022-06-29 NOTE — Progress Notes (Signed)
[facility]  Patient name: Brian [name] MRN: [id number] DOB: 25-Jul-1940 Sex: male   REASON FOR VISIT:    Follow up  HISOTRY OF PRESENT ILLNESS:    Brian [name] is a 81 y.o. male who is here today for further evaluation of subclavian artery stenosis.  He has recently been in the emergency department for severe dizziness and near passing out.  His workup has been essentially unremarkable except for some subclavian stenosis.  He has known asymmetric blood pressures in his arms.  He denies any heaviness or weakness when he uses his left arm.  In fact his left arm he states is stronger than the right.  The patient has a history of coronary artery disease, status post MI treated with PCI in 1998. He is medically managed for hypertension. He takes a statin for hypercholesterolemia. He suffers from obstructive sleep apnea for which he uses CPAP. He is a current smoker.  PAST MEDICAL HISTORY:   Past Medical History:  Diagnosis Date   Adenomatous polyp    Carpal tunnel syndrome on left    Cervical spine disease    Coronary artery disease    S/p anterior MI tx with PCI in 1998  //  Myoview 6/22: EF 51, ant and septal scar; no ischemia; intermediate risk // Echocardiogram 7/22: EF 45-50, ant-sept and apical HK, Gr 1 DD, normal RVSF, mild MR   Hyperlipidemia    Hypertension    Ischemic cardiomyopathy 05/19/2022   Echocardiogram 06/02/22: EF 45-50, global HK, Gr 1 DD, NL RVSF, NL PASP (RVSP 26.4), trivial MR, RAP 3   Low back pain    Myocardial infarction ([place]) 1998   Sleep apnea    CPAP   Wears dentures    full upper     FAMILY HISTORY:   Family History  Problem Relation Age of Onset   Kidney disease Father    Hypertension Father    Heart failure Mother    Coronary artery disease Brother     SOCIAL HISTORY:   Social History   Tobacco Use   Smoking status: Every Day    Packs/day: 0.50    Years: 60.00     Total pack years: 30.00    Types: Cigarettes   Smokeless tobacco: Never   Tobacco comments:    1/2 3/4 ppd (since age 35)  Substance Use Topics   Alcohol use: Not Currently    Alcohol/week: 0.0 standard drinks of alcohol    Comment: occasionally     ALLERGIES:   No Known Allergies   CURRENT MEDICATIONS:   Current Outpatient Medications  Medication Sig Dispense Refill   acetaminophen (TYLENOL) 325 MG tablet Take 325 mg by mouth daily as needed.     aspirin 81 MG tablet Take 81 mg by mouth daily.       atorvastatin (LIPITOR) 40 MG tablet Take 40 mg by mouth daily.       ciprofloxacin (CIPRO) 500 MG tablet Take 1 tablet (500 mg total) by mouth 2 (two) times daily. 20 tablet 0   Coenzyme Q10 (COQ10) 100 MG CAPS Take 1 capsule by mouth daily.       latanoprost (XALATAN) 0.005 % ophthalmic solution Place 1 drop into the right eye at bedtime.     lisinopril (ZESTRIL) 10 MG tablet Take 1 tablet (10 mg total) by mouth daily. 90 tablet 3   metoprolol succinate (TOPROL-XL) 25 MG 24 hr tablet TAKE 1 TABLET(25 MG) BY MOUTH DAILY  30 tablet 4   metroNIDAZOLE (FLAGYL) 500 MG tablet Take 1 tablet (500 mg total) by mouth 2 (two) times daily. 20 tablet 0   Multiple Vitamin (MULTIVITAMIN PO) Take 1 tablet by mouth daily.     nitroGLYCERIN (NITROSTAT) 0.4 MG SL tablet Place 0.4 mg under the tongue every 5 (five) minutes as needed for chest pain.     Oxycodone HCl 10 MG TABS Take 10-20 mg by mouth every 6 (six) hours as needed.     No current facility-administered medications for this visit.   Facility-Administered Medications Ordered in Other Visits  Medication Dose Route Frequency Provider Last Rate Last Admin   albuterol (PROVENTIL) (2.5 MG/3ML) 0.083% nebulizer solution 2.5 mg  2.5 mg Nebulization Once Stoneking, Hal, MD        REVIEW OF SYSTEMS:   '[X]'$  denotes positive finding, '[ ]'$  denotes negative finding Cardiac  Comments:  Chest pain or chest pressure:    Shortness of breath upon  exertion:    Short of breath when lying flat:    Irregular heart rhythm:        Vascular    Pain in calf, thigh, or hip brought on by ambulation:    Pain in feet at night that wakes you up from your sleep:     Blood clot in your veins:    Leg swelling:         Pulmonary    Oxygen at home:    Productive cough:     Wheezing:         Neurologic    Sudden weakness in arms or legs:     Sudden numbness in arms or legs:     Sudden onset of difficulty speaking or slurred speech:    Temporary loss of vision in one eye:     Problems with dizziness:  x       Gastrointestinal    Blood in stool:     Vomited blood:         Genitourinary    Burning when urinating:     Blood in urine:        Psychiatric    Major depression:         Hematologic    Bleeding problems:    Problems with blood clotting too easily:        Skin    Rashes or ulcers:        Constitutional    Fever or chills:      PHYSICAL EXAM:   Vitals:   06/29/22 1038 06/29/22 1040  BP: (!) 154/94 (!) 162/103  Pulse: 70   Resp: 20   Temp: 98.2 F (36.8 C)   SpO2: 96%   Weight: 157 lb (71.2 kg)   Height: '5\' 9"'$  (1.753 m)     GENERAL: The patient is a well-nourished male, in no acute distress. The vital signs are documented above. CARDIAC: There is a regular rate and rhythm.  VASCULAR: Full radial pulses bilaterally.  The right is slightly stronger PULMONARY: Non-labored respirations ABDOMEN: Soft and non-tender with normal pitched bowel sounds.  MUSCULOSKELETAL: There are no major deformities or cyanosis. NEUROLOGIC: No focal weakness or paresthesias are detected. SKIN: There are no ulcers or rashes noted. PSYCHIATRIC: The patient has a normal affect.  STUDIES:   I have reviewed the following CT scan:  1. Stable uncomplicated fusiform aneurysmal dilatation of the ascending thoracic aorta measuring 42 mm in diameter, unchanged since the 07/2015 examination. 2. Suspected hemodynamically significant  narrowing involving the origin and proximal aspect of the left subclavian artery. Correlation for symptoms of subclavian steal syndrome is advised (asymmetry between the bilateral upper extremity blood pressure measurements), though preserved antegrade flow was demonstrated within the left vertebral artery on recent carotid Doppler ultrasound performed 06/02/2022. 3. Apparent dilatation of the common bile duct, the etiology which is not depicted on this examination. Additionally, there is progressive nodularity of the hepatic contour as can be seen in the setting of cirrhotic change. Correlation with LFTs is advised. Further evaluation with abdominal ultrasound and/or CT could be performed as indicated. 4. Extensive coronary artery calcifications. 5. Aortic Atherosclerosis (ICD10-I70.0) and Emphysema (ICD10-J43.9).   Carotid ultrasound: RIGHT CAROTID ARTERY: There is a moderate amount of eccentric echogenic plaque within the right carotid bulb (image 5 and 17), extending to involve the origin and proximal aspects of the right internal carotid artery (image 22), not resulting in elevated peak systolic velocities within the interrogated course of the right internal carotid artery to suggest a hemodynamically significant stenosis.   RIGHT VERTEBRAL ARTERY:  Antegrade flow   LEFT CAROTID ARTERY: There is a moderate amount of eccentric echogenic plaque within the left carotid bulb (images 48 and 49), not resulting in elevated peak systolic velocities within the interrogated course of the left internal carotid artery to suggest a hemodynamically significant stenosis.   LEFT VERTEBRAL ARTERY:  Antegrade flow   IMPRESSION: Moderate amount of bilateral atherosclerotic plaque, not resulting in a hemodynamically significant stenosis within either internal carotid artery.    MEDICAL ISSUES:   Left subclavian artery stenosis: The patient has approximate 50% stenosis in his proximal left  subclavian artery on CT scan.  Ultrasound shows that vertebral arteries are antegrade bilaterally.  The patient does not have signs of vertebrobasilar insufficiency.  I reviewed his prior CT scans which actually go back to 2015 as a noncontrasted lung cancer screening study and the plaque in his left subclavian artery looks essentially the same.  Therefore I do not think there is been any significant changes in the stenosis within his left subclavian artery for at least the past 8 years.  I do not think this explains his dizziness.  His vertebral arteries remain antegrade.  I would search for other possible etiologies.  I do not think he would benefit from stenting at this time.  I have him scheduled for follow-up in 1 year with repeat ultrasound studies.    Leia Alf, MD, FACS [facility] [phone] Pager [phone]

## 2022-07-01 ENCOUNTER — Ambulatory Visit
Admission: RE | Admit: 2022-07-01 | Discharge: 2022-07-01 | Disposition: A | Discharge status: Home / Self Care | Payer: Medicare Other | Source: Ambulatory Visit | Attending: Internal Medicine | Admitting: Internal Medicine

## 2022-07-01 DIAGNOSIS — I7121 Aneurysm of the ascending aorta, without rupture: Secondary | ICD-10-CM | POA: Insufficient documentation

## 2022-07-01 DIAGNOSIS — K838 Other specified diseases of biliary tract: Secondary | ICD-10-CM | POA: Diagnosis not present

## 2022-07-01 DIAGNOSIS — K8020 Calculus of gallbladder without cholecystitis without obstruction: Secondary | ICD-10-CM | POA: Diagnosis not present

## 2022-07-01 DIAGNOSIS — R932 Abnormal findings on diagnostic imaging of liver and biliary tract: Secondary | ICD-10-CM | POA: Diagnosis not present

## 2022-07-01 DIAGNOSIS — I771 Stricture of artery: Secondary | ICD-10-CM | POA: Insufficient documentation

## 2022-07-01 NOTE — Progress Notes (Unsigned)
Cardiology Office Note:    Date:  07/02/2022   ID:  Brian [name], DOB 1941-01-30, MRN [id number]  PCP:  [name], MD  Cardiologist:  Sinclair Grooms, MD   Referring MD: [name], *   Chief Complaint  Patient presents with   Coronary Artery Disease   Follow-up    Aortic aneurysm, ascending   Hypertension    History of Present Illness:    Brian [name] is a 81 y.o. male with a hx of CAD, previous anterior myocardial infarction treated with angioplasty, hypertension, hyperlipidemia, OSA on CPAP and cigarette smoking.   No angina or specific cardiac complaints.  Left subclavian stenosis.  Right arm blood pressures should drive therapy.  He denies angina.  No orthopnea PND.  No nitroglycerin use.  He is now undergoing evaluation for a sensation of dizziness that is poorly characterized.  Past Medical History:  Diagnosis Date   Adenomatous polyp    Carpal tunnel syndrome on left    Cervical spine disease    Coronary artery disease    S/p anterior MI tx with PCI in 1998  //  Myoview 6/22: EF 45, ant and septal scar; no ischemia; intermediate risk // Echocardiogram 7/22: EF 45-50, ant-sept and apical HK, Gr 1 DD, normal RVSF, mild MR   Hyperlipidemia    Hypertension    Ischemic cardiomyopathy 05/19/2022   Echocardiogram 06/02/22: EF 45-50, global HK, Gr 1 DD, NL RVSF, NL PASP (RVSP 26.4), trivial MR, RAP 3   Low back pain    Myocardial infarction ([place]) 1998   Sleep apnea    CPAP   Wears dentures    full upper    Past Surgical History:  Procedure Laterality Date   APPENDECTOMY     bunions     CATARACT EXTRACTION     COLONOSCOPY WITH PROPOFOL N/A 02/19/2015   Procedure: COLONOSCOPY WITH PROPOFOL;  Surgeon: Lucilla Lame, MD;  Location: [facility] ENDOSCOPY;  Service: Endoscopy;  Laterality: N/A;   COLONOSCOPY WITH PROPOFOL N/A 06/05/2019   Procedure: COLONOSCOPY WITH BIOPSY;  Surgeon: Lucilla Lame, MD;  Location: [facility];  Service:  Endoscopy;  Laterality: N/A;  sleep apnea   HEMORRHOID BANDING     HERNIA REPAIR     lumbar disc protrussion  03/2010   POLYPECTOMY N/A 06/05/2019   Procedure: POLYPECTOMY;  Surgeon: Lucilla Lame, MD;  Location: [facility];  Service: Endoscopy;  Laterality: N/A;    Current Medications: Current Meds  Medication Sig   acetaminophen (TYLENOL) 325 MG tablet Take 325 mg by mouth daily as needed.   aspirin 81 MG tablet Take 81 mg by mouth daily.     atorvastatin (LIPITOR) 40 MG tablet Take 40 mg by mouth daily.     Cholecalciferol (VITAMIN D3) 1.25 MG (50000 UT) CAPS Take 1 capsule by mouth once a week. Pt directed to stop taking after 3 weeks.   ciprofloxacin (CIPRO) 500 MG tablet Take 1 tablet (500 mg total) by mouth 2 (two) times daily.   Coenzyme Q10 (COQ10) 100 MG CAPS Take 1 capsule by mouth daily.     latanoprost (XALATAN) 0.005 % ophthalmic solution Place 1 drop into the right eye at bedtime.   lisinopril (ZESTRIL) 10 MG tablet Take 1 tablet (10 mg total) by mouth daily.   metoprolol succinate (TOPROL-XL) 25 MG 24 hr tablet TAKE 1 TABLET(25 MG) BY MOUTH DAILY   metroNIDAZOLE (FLAGYL) 500 MG tablet Take 1 tablet (500 mg total) by mouth  2 (two) times daily.   Multiple Vitamin (MULTIVITAMIN PO) Take 1 tablet by mouth daily.   nitroGLYCERIN (NITROSTAT) 0.4 MG SL tablet Place 0.4 mg under the tongue every 5 (five) minutes as needed for chest pain.   Oxycodone HCl 10 MG TABS Take 10-20 mg by mouth every 6 (six) hours as needed.     Allergies:   Patient has no known allergies.   Social History   Socioeconomic History   Marital status: Married    Spouse name: Brian [name]   Number of children: 1   Years of education: Not on file   Highest education level: Not on file  Occupational History   Occupation: retired    Comment: Chemical engineer  Tobacco Use   Smoking status: Every Day    Packs/day: 0.50    Years: 60.00    Total pack years: 30.00    Types:  Cigarettes   Smokeless tobacco: Never   Tobacco comments:    1/2 3/4 ppd (since age 81)  22 Use   Vaping Use: Never used  Substance and Sexual Activity   Alcohol use: Not Currently    Alcohol/week: 0.0 standard drinks of alcohol    Comment: occasionally   Drug use: No   Sexual activity: Not on file  Other Topics Concern   Not on file  Social History Narrative   Not on file   Social Determinants of Health   Financial Resource Strain: Not on file  Food Insecurity: Not on file  Transportation Needs: Not on file  Physical Activity: Not on file  Stress: Not on file  Social Connections: Not on file     Family History: The patient's family history includes Coronary artery disease in his brother; Heart failure in his mother; Hypertension in his father; Kidney disease in his father.  ROS:   Please see the history of present illness.    Dizziness associated with a funny feeling in his chest.  A feeling of being nervous.  During this time he feels dizzy but is not having vertigo.  All other systems reviewed and are negative.  EKGs/Labs/Other Studies Reviewed:    The following studies were reviewed today:  2D Doppler echocardiogram 06/02/2022: IMPRESSIONS   1. Left ventricular ejection fraction, by estimation, is 45 to 50%. The  left ventricle has mildly decreased function. The left ventricle  demonstrates global hypokinesis. Left ventricular diastolic parameters are  consistent with Grade I diastolic  dysfunction (impaired relaxation).   2. Right ventricular systolic function is normal. The right ventricular  size is normal. There is normal pulmonary artery systolic pressure.   3. The mitral valve is normal in structure. Trivial mitral valve  regurgitation. No evidence of mitral stenosis.   4. The aortic valve is tricuspid. Aortic valve regurgitation is not  visualized. No aortic stenosis is present.   5. The inferior vena cava is normal in size with greater than 50%   respiratory variability, suggesting right atrial pressure of 3 mmHg.   CT angiogram of the aorta 06/17/2022: IMPRESSION: 1. Stable uncomplicated fusiform aneurysmal dilatation of the ascending thoracic aorta measuring 42 mm in diameter, unchanged since the 07/2015 examination. 2. Suspected hemodynamically significant narrowing involving the origin and proximal aspect of the left subclavian artery. Correlation for symptoms of subclavian steal syndrome is advised (asymmetry between the bilateral upper extremity blood pressure measurements), though preserved antegrade flow was demonstrated within the left vertebral artery on recent carotid Doppler ultrasound performed 06/02/2022. 3. Apparent dilatation of the  common bile duct, the etiology which is not depicted on this examination. Additionally, there is progressive nodularity of the hepatic contour as can be seen in the setting of cirrhotic change. Correlation with LFTs is advised. Further evaluation with abdominal ultrasound and/or CT could be performed as indicated. 4. Extensive coronary artery calcifications. 5. Aortic Atherosclerosis (ICD10-I70.0) and Emphysema (ICD10-J43.9).   EKG:  EKG not repeated  Recent Labs: 05/16/2022: Hemoglobin 15.0; Platelets 250 06/03/2022: BUN 14; Creatinine, Ser 0.86; Potassium 4.1; Sodium 136  Recent Lipid Panel No results found for: "CHOL", "TRIG", "HDL", "CHOLHDL", "VLDL", "LDLCALC", "LDLDIRECT"  Physical Exam:    VS:  BP 113/69   Pulse 61   Ht '5\' 9"'$  (1.753 m)   Wt 156 lb (70.8 kg)   SpO2 96%   BMI 23.04 kg/m     Wt Readings from Last 3 Encounters:  07/02/22 156 lb (70.8 kg)  06/29/22 157 lb (71.2 kg)  06/24/22 154 lb (69.9 kg)     GEN: Slender and still relatively young appearing. No acute distress HEENT: Normal NECK: No JVD. LYMPHATICS: No lymphadenopathy CARDIAC: No murmur. RRR no forced gallop, or edema. VASCULAR: Very Normal Pulses. No bruits. RESPIRATORY:  Clear to  auscultation without rales, wheezing or rhonchi  ABDOMEN: Soft, non-tender, non-distended, No pulsatile mass, MUSCULOSKELETAL: No deformity  SKIN: Warm and dry NEUROLOGIC:  Alert and oriented x 3 PSYCHIATRIC:  Normal affect   ASSESSMENT:    1. Coronary artery disease involving native coronary artery of native heart with angina pectoris ([place])   2. Primary hypertension   3. Chronic combined systolic and diastolic heart failure ([place])   4. Hyperlipidemia LDL goal <70   5. Infrarenal abdominal aortic aneurysm (AAA) without rupture ([place])   6. Tobacco abuse   7. Sleep apnea, unspecified type   8. Stenosis of left subclavian artery (H[facility])   9. Aneurysm of ascending aorta without rupture ([facility])    PLAN:    In order of problems listed above:  Secondary prevention reviewed.  Encouraged to not smoke cigarettes. Right arm blood pressure should be used as there is left subclavian stenosis.  Blood pressure is adequately controlled at 140/80 mmHg in the right arm on today's date.  Increase lisinopril to 20 mg/day.  A new prescription will be written if he can tolerate doubling his dose using 10 mg tablets. Continue Zestril, Toprol, and consider switching to SGLT2 +/-  Entresto if develops symptoms of failure.  Currently not having dyspnea and will not change medication regimen. Continue Lipitor 40 Monitor Smoking cessation requested Encourage CPAP Educated the patient that the reliable blood pressure in his case would be right arm since there is left subclavian stenosis. Ascending aortic aneurysm, fusiform, relatively small at 4.2 cm.  She will probably have a repeat study in 1 year.  Lisinopril dose was increased to better control systolic blood pressure.  Recommended Dr. Drucie Ip.   Medication Adjustments/Labs and Tests Ordered: Current medicines are reviewed at length with the patient today.  Concerns regarding medicines are outlined above.  No orders of the defined types  were placed in this encounter.  No orders of the defined types were placed in this encounter.   Patient Instructions  Medication Instructions:  Your physician recommends that you continue on your current medications as directed. Please refer to the Current Medication list given to you today.  *If you need a refill on your cardiac medications before your next appointment, please call your pharmacy*  Follow-Up: At [facility], you and  your health needs are our priority.  As part of our continuing mission to provide you with exceptional heart care, we have created designated Provider Care Teams.  These Care Teams include your primary Cardiologist (physician) and Advanced Practice Providers (APPs -  Physician Assistants and Nurse Practitioners) who all work together to provide you with the care you need, when you need it.  Your next appointment:   6-9 month(s)  The format for your next appointment:   In Person  Provider:   Candee Furbish, MD or Rudean Haskell, MD or Gwyndolyn Kaufman, MD  Important Information About Sugar         Signed, Sinclair Grooms, MD  07/02/2022 5:59 PM    [place]

## 2022-07-02 ENCOUNTER — Encounter: Payer: Self-pay | Admitting: Interventional Cardiology

## 2022-07-02 ENCOUNTER — Ambulatory Visit: Payer: Medicare Other | Attending: Interventional Cardiology | Admitting: Interventional Cardiology

## 2022-07-02 VITALS — BP 140/80 | HR 61 | Ht 69.0 in | Wt 156.0 lb

## 2022-07-02 DIAGNOSIS — G4730 Sleep apnea, unspecified: Secondary | ICD-10-CM

## 2022-07-02 DIAGNOSIS — Z720 Tobacco use: Secondary | ICD-10-CM

## 2022-07-02 DIAGNOSIS — I5042 Chronic combined systolic (congestive) and diastolic (congestive) heart failure: Secondary | ICD-10-CM | POA: Diagnosis not present

## 2022-07-02 DIAGNOSIS — I10 Essential (primary) hypertension: Secondary | ICD-10-CM

## 2022-07-02 DIAGNOSIS — I7121 Aneurysm of the ascending aorta, without rupture: Secondary | ICD-10-CM | POA: Diagnosis not present

## 2022-07-02 DIAGNOSIS — K838 Other specified diseases of biliary tract: Secondary | ICD-10-CM | POA: Diagnosis not present

## 2022-07-02 DIAGNOSIS — E785 Hyperlipidemia, unspecified: Secondary | ICD-10-CM

## 2022-07-02 DIAGNOSIS — I25119 Atherosclerotic heart disease of native coronary artery with unspecified angina pectoris: Secondary | ICD-10-CM | POA: Diagnosis not present

## 2022-07-02 DIAGNOSIS — I7143 Infrarenal abdominal aortic aneurysm, without rupture: Secondary | ICD-10-CM

## 2022-07-02 DIAGNOSIS — I771 Stricture of artery: Secondary | ICD-10-CM | POA: Diagnosis not present

## 2022-07-02 NOTE — Patient Instructions (Signed)
Medication Instructions:  Your physician recommends that you continue on your current medications as directed. Please refer to the Current Medication list given to you today.  *If you need a refill on your cardiac medications before your next appointment, please call your pharmacy*  Follow-Up: At [facility], you and your health needs are our priority.  As part of our continuing mission to provide you with exceptional heart care, we have created designated Provider Care Teams.  These Care Teams include your primary Cardiologist (physician) and Advanced Practice Providers (APPs -  Physician Assistants and Nurse Practitioners) who all work together to provide you with the care you need, when you need it.  Your next appointment:   6-9 month(s)  The format for your next appointment:   In Person  Provider:   Candee Furbish, MD or Rudean Haskell, MD or Gwyndolyn Kaufman, MD  Important Information About Sugar

## 2022-07-03 DIAGNOSIS — G4730 Sleep apnea, unspecified: Secondary | ICD-10-CM | POA: Diagnosis not present

## 2022-07-03 DIAGNOSIS — G4733 Obstructive sleep apnea (adult) (pediatric): Secondary | ICD-10-CM | POA: Diagnosis not present

## 2022-07-06 ENCOUNTER — Encounter: Payer: Self-pay | Admitting: Interventional Cardiology

## 2022-07-06 ENCOUNTER — Other Ambulatory Visit: Payer: Self-pay | Admitting: Internal Medicine

## 2022-07-06 DIAGNOSIS — K838 Other specified diseases of biliary tract: Secondary | ICD-10-CM

## 2022-07-09 DIAGNOSIS — T8522XA Displacement of intraocular lens, initial encounter: Secondary | ICD-10-CM | POA: Diagnosis not present

## 2022-07-09 DIAGNOSIS — I10 Essential (primary) hypertension: Secondary | ICD-10-CM | POA: Diagnosis not present

## 2022-07-09 DIAGNOSIS — I252 Old myocardial infarction: Secondary | ICD-10-CM | POA: Diagnosis not present

## 2022-07-09 DIAGNOSIS — F17210 Nicotine dependence, cigarettes, uncomplicated: Secondary | ICD-10-CM | POA: Diagnosis not present

## 2022-07-09 DIAGNOSIS — T8529XA Other mechanical complication of intraocular lens, initial encounter: Secondary | ICD-10-CM | POA: Diagnosis not present

## 2022-07-09 DIAGNOSIS — G4730 Sleep apnea, unspecified: Secondary | ICD-10-CM | POA: Diagnosis not present

## 2022-07-09 DIAGNOSIS — Z955 Presence of coronary angioplasty implant and graft: Secondary | ICD-10-CM | POA: Diagnosis not present

## 2022-07-09 DIAGNOSIS — Z7982 Long term (current) use of aspirin: Secondary | ICD-10-CM | POA: Diagnosis not present

## 2022-07-09 DIAGNOSIS — Z79899 Other long term (current) drug therapy: Secondary | ICD-10-CM | POA: Diagnosis not present

## 2022-07-09 DIAGNOSIS — I2510 Atherosclerotic heart disease of native coronary artery without angina pectoris: Secondary | ICD-10-CM | POA: Diagnosis not present

## 2022-07-15 ENCOUNTER — Encounter: Payer: Self-pay | Admitting: Interventional Cardiology

## 2022-07-17 ENCOUNTER — Other Ambulatory Visit: Payer: Self-pay | Admitting: Internal Medicine

## 2022-07-17 ENCOUNTER — Ambulatory Visit
Admission: RE | Admit: 2022-07-17 | Discharge: 2022-07-17 | Disposition: A | Discharge status: Home / Self Care | Payer: Medicare Other | Source: Ambulatory Visit | Attending: Internal Medicine | Admitting: Internal Medicine

## 2022-07-17 DIAGNOSIS — K8689 Other specified diseases of pancreas: Secondary | ICD-10-CM | POA: Diagnosis not present

## 2022-07-17 DIAGNOSIS — G4730 Sleep apnea, unspecified: Secondary | ICD-10-CM | POA: Diagnosis not present

## 2022-07-17 DIAGNOSIS — K838 Other specified diseases of biliary tract: Secondary | ICD-10-CM | POA: Insufficient documentation

## 2022-07-17 DIAGNOSIS — G4733 Obstructive sleep apnea (adult) (pediatric): Secondary | ICD-10-CM | POA: Diagnosis not present

## 2022-07-17 DIAGNOSIS — K8020 Calculus of gallbladder without cholecystitis without obstruction: Secondary | ICD-10-CM | POA: Diagnosis not present

## 2022-07-17 DIAGNOSIS — R935 Abnormal findings on diagnostic imaging of other abdominal regions, including retroperitoneum: Secondary | ICD-10-CM | POA: Diagnosis not present

## 2022-07-17 MED ORDER — GADOBUTROL 1 MMOL/ML IV SOLN
7.0000 mL | Freq: Once | INTRAVENOUS | Status: AC | PRN
  Administered 2022-07-17: 7 mL via INTRAVENOUS

## 2022-07-22 MED ORDER — LISINOPRIL 20 MG PO TABS: 20.0000 mg | ORAL_TABLET | Freq: Every day | ORAL | 3 refills | Status: DC

## 2022-07-22 NOTE — Addendum Note (Signed)
Addended by: Molli Barrows on: 07/22/2022 03:10 PM   Modules accepted: Orders

## 2022-07-27 DIAGNOSIS — M5441 Lumbago with sciatica, right side: Secondary | ICD-10-CM | POA: Diagnosis not present

## 2022-07-27 DIAGNOSIS — M6281 Muscle weakness (generalized): Secondary | ICD-10-CM | POA: Diagnosis not present

## 2022-07-27 DIAGNOSIS — R2681 Unsteadiness on feet: Secondary | ICD-10-CM | POA: Diagnosis not present

## 2022-07-27 DIAGNOSIS — R2689 Other abnormalities of gait and mobility: Secondary | ICD-10-CM | POA: Diagnosis not present

## 2022-07-29 ENCOUNTER — Telehealth: Payer: Self-pay

## 2022-07-29 DIAGNOSIS — R1032 Left lower quadrant pain: Secondary | ICD-10-CM

## 2022-07-29 NOTE — Telephone Encounter (Signed)
Pt states he had an MRI recently that showed a dilated bile duct with stone and his PCP stated that he needed to f/u with GI regarding this....  Pt is not currently experiencing any symptoms at this time  Please advise if this pt would need an OV to discuss, or just schedule procedure.Marland KitchenMarland Kitchen

## 2022-07-31 ENCOUNTER — Other Ambulatory Visit: Payer: Self-pay

## 2022-07-31 DIAGNOSIS — M6281 Muscle weakness (generalized): Secondary | ICD-10-CM | POA: Diagnosis not present

## 2022-07-31 DIAGNOSIS — M5441 Lumbago with sciatica, right side: Secondary | ICD-10-CM | POA: Diagnosis not present

## 2022-07-31 DIAGNOSIS — K8050 Calculus of bile duct without cholangitis or cholecystitis without obstruction: Secondary | ICD-10-CM

## 2022-07-31 DIAGNOSIS — R2681 Unsteadiness on feet: Secondary | ICD-10-CM | POA: Diagnosis not present

## 2022-07-31 DIAGNOSIS — R2689 Other abnormalities of gait and mobility: Secondary | ICD-10-CM | POA: Diagnosis not present

## 2022-08-03 DIAGNOSIS — G4733 Obstructive sleep apnea (adult) (pediatric): Secondary | ICD-10-CM | POA: Diagnosis not present

## 2022-08-04 DIAGNOSIS — R2681 Unsteadiness on feet: Secondary | ICD-10-CM | POA: Diagnosis not present

## 2022-08-04 DIAGNOSIS — M5441 Lumbago with sciatica, right side: Secondary | ICD-10-CM | POA: Diagnosis not present

## 2022-08-04 DIAGNOSIS — R2689 Other abnormalities of gait and mobility: Secondary | ICD-10-CM | POA: Diagnosis not present

## 2022-08-04 DIAGNOSIS — M6281 Muscle weakness (generalized): Secondary | ICD-10-CM | POA: Diagnosis not present

## 2022-08-04 NOTE — Addendum Note (Signed)
Addended by: Lurlean Nanny on: 08/04/2022 05:02 PM   Modules accepted: Orders

## 2022-08-04 NOTE — Telephone Encounter (Signed)
ERCP scheduled and INR ordered... instructions released to Mychart and mailed... Pt states he will go to [facility] to have lab drawn

## 2022-08-07 DIAGNOSIS — M5441 Lumbago with sciatica, right side: Secondary | ICD-10-CM | POA: Diagnosis not present

## 2022-08-07 DIAGNOSIS — M6281 Muscle weakness (generalized): Secondary | ICD-10-CM | POA: Diagnosis not present

## 2022-08-07 DIAGNOSIS — R2681 Unsteadiness on feet: Secondary | ICD-10-CM | POA: Diagnosis not present

## 2022-08-07 DIAGNOSIS — R2689 Other abnormalities of gait and mobility: Secondary | ICD-10-CM | POA: Diagnosis not present

## 2022-08-11 DIAGNOSIS — R2681 Unsteadiness on feet: Secondary | ICD-10-CM | POA: Diagnosis not present

## 2022-08-11 DIAGNOSIS — M6281 Muscle weakness (generalized): Secondary | ICD-10-CM | POA: Diagnosis not present

## 2022-08-11 DIAGNOSIS — R2689 Other abnormalities of gait and mobility: Secondary | ICD-10-CM | POA: Diagnosis not present

## 2022-08-11 DIAGNOSIS — M5441 Lumbago with sciatica, right side: Secondary | ICD-10-CM | POA: Diagnosis not present

## 2022-08-12 ENCOUNTER — Other Ambulatory Visit
Admission: RE | Admit: 2022-08-12 | Discharge: 2022-08-12 | Disposition: A | Discharge status: Home / Self Care | Payer: Medicare Other | Source: Ambulatory Visit | Attending: Gastroenterology | Admitting: Gastroenterology

## 2022-08-12 DIAGNOSIS — R1032 Left lower quadrant pain: Secondary | ICD-10-CM | POA: Diagnosis not present

## 2022-08-12 LAB — PROTIME-INR
INR: 1 (ref 0.8–1.2)
Prothrombin Time: 12.6 seconds (ref 11.4–15.2)

## 2022-08-14 DIAGNOSIS — R2689 Other abnormalities of gait and mobility: Secondary | ICD-10-CM | POA: Diagnosis not present

## 2022-08-14 DIAGNOSIS — M5441 Lumbago with sciatica, right side: Secondary | ICD-10-CM | POA: Diagnosis not present

## 2022-08-14 DIAGNOSIS — R2681 Unsteadiness on feet: Secondary | ICD-10-CM | POA: Diagnosis not present

## 2022-08-14 DIAGNOSIS — M6281 Muscle weakness (generalized): Secondary | ICD-10-CM | POA: Diagnosis not present

## 2022-08-17 DIAGNOSIS — R2681 Unsteadiness on feet: Secondary | ICD-10-CM | POA: Diagnosis not present

## 2022-08-17 DIAGNOSIS — M6281 Muscle weakness (generalized): Secondary | ICD-10-CM | POA: Diagnosis not present

## 2022-08-17 DIAGNOSIS — M5441 Lumbago with sciatica, right side: Secondary | ICD-10-CM | POA: Diagnosis not present

## 2022-08-17 DIAGNOSIS — R2689 Other abnormalities of gait and mobility: Secondary | ICD-10-CM | POA: Diagnosis not present

## 2022-08-18 ENCOUNTER — Other Ambulatory Visit: Payer: Self-pay

## 2022-08-18 MED ORDER — METOPROLOL SUCCINATE ER 25 MG PO TB24: ORAL_TABLET | ORAL | 11 refills | Status: DC

## 2022-08-24 ENCOUNTER — Encounter: Payer: Self-pay | Admitting: Gastroenterology

## 2022-08-25 ENCOUNTER — Ambulatory Visit: Payer: Medicare Other | Admitting: Certified Registered"

## 2022-08-25 ENCOUNTER — Encounter
Admission: RE | Disposition: A | Discharge status: Home / Self Care | Payer: Self-pay | Source: Ambulatory Visit | Attending: Gastroenterology

## 2022-08-25 ENCOUNTER — Ambulatory Visit (HOSPITAL_BASED_OUTPATIENT_CLINIC_OR_DEPARTMENT_OTHER)
Admission: RE | Admit: 2022-08-25 | Discharge: 2022-08-25 | Disposition: A | Discharge status: Home / Self Care | Payer: Medicare Other | Source: Ambulatory Visit | Attending: Gastroenterology | Admitting: Gastroenterology

## 2022-08-25 ENCOUNTER — Ambulatory Visit: Payer: Medicare Other

## 2022-08-25 ENCOUNTER — Encounter: Payer: Self-pay | Admitting: Gastroenterology

## 2022-08-25 DIAGNOSIS — K8050 Calculus of bile duct without cholangitis or cholecystitis without obstruction: Secondary | ICD-10-CM | POA: Diagnosis not present

## 2022-08-25 DIAGNOSIS — F17210 Nicotine dependence, cigarettes, uncomplicated: Secondary | ICD-10-CM | POA: Insufficient documentation

## 2022-08-25 DIAGNOSIS — R0602 Shortness of breath: Secondary | ICD-10-CM | POA: Diagnosis not present

## 2022-08-25 DIAGNOSIS — Z7982 Long term (current) use of aspirin: Secondary | ICD-10-CM | POA: Diagnosis not present

## 2022-08-25 DIAGNOSIS — Z955 Presence of coronary angioplasty implant and graft: Secondary | ICD-10-CM | POA: Insufficient documentation

## 2022-08-25 DIAGNOSIS — H409 Unspecified glaucoma: Secondary | ICD-10-CM | POA: Diagnosis not present

## 2022-08-25 DIAGNOSIS — Z8249 Family history of ischemic heart disease and other diseases of the circulatory system: Secondary | ICD-10-CM | POA: Diagnosis not present

## 2022-08-25 DIAGNOSIS — I2693 Single subsegmental pulmonary embolism without acute cor pulmonale: Secondary | ICD-10-CM | POA: Diagnosis not present

## 2022-08-25 DIAGNOSIS — R6520 Severe sepsis without septic shock: Secondary | ICD-10-CM | POA: Diagnosis not present

## 2022-08-25 DIAGNOSIS — G4733 Obstructive sleep apnea (adult) (pediatric): Secondary | ICD-10-CM | POA: Diagnosis not present

## 2022-08-25 DIAGNOSIS — I714 Abdominal aortic aneurysm, without rupture, unspecified: Secondary | ICD-10-CM | POA: Insufficient documentation

## 2022-08-25 DIAGNOSIS — I252 Old myocardial infarction: Secondary | ICD-10-CM | POA: Insufficient documentation

## 2022-08-25 DIAGNOSIS — I2699 Other pulmonary embolism without acute cor pulmonale: Secondary | ICD-10-CM | POA: Diagnosis not present

## 2022-08-25 DIAGNOSIS — G8929 Other chronic pain: Secondary | ICD-10-CM | POA: Diagnosis not present

## 2022-08-25 DIAGNOSIS — A419 Sepsis, unspecified organism: Secondary | ICD-10-CM | POA: Diagnosis not present

## 2022-08-25 DIAGNOSIS — N179 Acute kidney failure, unspecified: Secondary | ICD-10-CM | POA: Diagnosis not present

## 2022-08-25 DIAGNOSIS — I10 Essential (primary) hypertension: Secondary | ICD-10-CM | POA: Insufficient documentation

## 2022-08-25 DIAGNOSIS — G4730 Sleep apnea, unspecified: Secondary | ICD-10-CM

## 2022-08-25 DIAGNOSIS — K8309 Other cholangitis: Secondary | ICD-10-CM | POA: Diagnosis not present

## 2022-08-25 DIAGNOSIS — I255 Ischemic cardiomyopathy: Secondary | ICD-10-CM | POA: Diagnosis not present

## 2022-08-25 DIAGNOSIS — I739 Peripheral vascular disease, unspecified: Secondary | ICD-10-CM | POA: Diagnosis not present

## 2022-08-25 DIAGNOSIS — K838 Other specified diseases of biliary tract: Secondary | ICD-10-CM | POA: Insufficient documentation

## 2022-08-25 DIAGNOSIS — A4151 Sepsis due to Escherichia coli [E. coli]: Secondary | ICD-10-CM | POA: Diagnosis not present

## 2022-08-25 DIAGNOSIS — G4710 Hypersomnia, unspecified: Secondary | ICD-10-CM | POA: Diagnosis not present

## 2022-08-25 DIAGNOSIS — Z841 Family history of disorders of kidney and ureter: Secondary | ICD-10-CM | POA: Diagnosis not present

## 2022-08-25 DIAGNOSIS — K8032 Calculus of bile duct with acute cholangitis without obstruction: Secondary | ICD-10-CM | POA: Diagnosis not present

## 2022-08-25 DIAGNOSIS — R001 Bradycardia, unspecified: Secondary | ICD-10-CM | POA: Diagnosis not present

## 2022-08-25 DIAGNOSIS — R109 Unspecified abdominal pain: Secondary | ICD-10-CM | POA: Diagnosis not present

## 2022-08-25 DIAGNOSIS — Z79899 Other long term (current) drug therapy: Secondary | ICD-10-CM | POA: Diagnosis not present

## 2022-08-25 DIAGNOSIS — E871 Hypo-osmolality and hyponatremia: Secondary | ICD-10-CM | POA: Diagnosis not present

## 2022-08-25 DIAGNOSIS — I2510 Atherosclerotic heart disease of native coronary artery without angina pectoris: Secondary | ICD-10-CM | POA: Insufficient documentation

## 2022-08-25 DIAGNOSIS — E785 Hyperlipidemia, unspecified: Secondary | ICD-10-CM | POA: Diagnosis not present

## 2022-08-25 DIAGNOSIS — J439 Emphysema, unspecified: Secondary | ICD-10-CM | POA: Diagnosis not present

## 2022-08-25 DIAGNOSIS — I25119 Atherosclerotic heart disease of native coronary artery with unspecified angina pectoris: Secondary | ICD-10-CM | POA: Diagnosis not present

## 2022-08-25 DIAGNOSIS — E873 Alkalosis: Secondary | ICD-10-CM | POA: Diagnosis not present

## 2022-08-25 DIAGNOSIS — E7849 Other hyperlipidemia: Secondary | ICD-10-CM

## 2022-08-25 DIAGNOSIS — I700 Atherosclerosis of aorta: Secondary | ICD-10-CM | POA: Diagnosis not present

## 2022-08-25 HISTORY — PX: ERCP: SHX5425

## 2022-08-25 SURGERY — ERCP, WITH INTERVENTION IF INDICATED
Anesthesia: General

## 2022-08-25 MED ORDER — DICLOFENAC SUPPOSITORY 100 MG
100.0000 mg | Freq: Once | RECTAL | Status: AC
  Administered 2022-08-25: 100 mg via RECTAL

## 2022-08-25 MED ORDER — LACTATED RINGERS IV SOLN: INTRAVENOUS | Status: DC

## 2022-08-25 MED ORDER — PROPOFOL 10 MG/ML IV BOLUS
INTRAVENOUS | Status: DC | PRN
  Administered 2022-08-25: 50 mg via INTRAVENOUS
  Administered 2022-08-25: 100 mg via INTRAVENOUS
  Administered 2022-08-25: 60 mg via INTRAVENOUS

## 2022-08-25 MED ORDER — ONDANSETRON HCL 4 MG/2ML IJ SOLN
INTRAMUSCULAR | Status: DC | PRN
  Administered 2022-08-25: 4 mg via INTRAVENOUS

## 2022-08-25 MED ORDER — FENTANYL CITRATE (PF) 100 MCG/2ML IJ SOLN
INTRAMUSCULAR | Status: DC | PRN
  Administered 2022-08-25 (×2): 50 ug via INTRAVENOUS

## 2022-08-25 MED ORDER — LIDOCAINE HCL (CARDIAC) PF 100 MG/5ML IV SOSY
PREFILLED_SYRINGE | INTRAVENOUS | Status: DC | PRN
  Administered 2022-08-25: 80 mg via INTRAVENOUS

## 2022-08-25 MED ORDER — FENTANYL CITRATE (PF) 100 MCG/2ML IJ SOLN
INTRAMUSCULAR | Status: AC
  Filled 2022-08-25: qty 2

## 2022-08-25 MED ORDER — LACTATED RINGERS IV SOLN: INTRAVENOUS | Status: DC | PRN

## 2022-08-25 MED ORDER — PHENYLEPHRINE HCL (PRESSORS) 10 MG/ML IV SOLN
INTRAVENOUS | Status: DC | PRN
  Administered 2022-08-25: 160 ug via INTRAVENOUS
  Administered 2022-08-25: 320 ug via INTRAVENOUS
  Administered 2022-08-25: 160 ug via INTRAVENOUS

## 2022-08-25 MED ORDER — SUCCINYLCHOLINE CHLORIDE 200 MG/10ML IV SOSY
PREFILLED_SYRINGE | INTRAVENOUS | Status: DC | PRN
  Administered 2022-08-25: 50 mg via INTRAVENOUS

## 2022-08-25 MED ORDER — DICLOFENAC SUPPOSITORY 100 MG
RECTAL | Status: AC
  Filled 2022-08-25: qty 1

## 2022-08-25 NOTE — H&P (Signed)
Brian Lame, MD [facility] [address], [place], [place] [address] Phone:[phone] Fax : [phone]  Primary Care Physician:  Brian Frames, MD Primary Gastroenterologist:  Dr. Allen [name]  Pre-Procedure History & Physical: HPI:  Brian [name] is a 82 y.o. male is here for an ERCP.   Past Medical History:  Diagnosis Date   Adenomatous polyp    Carpal tunnel syndrome on left    Cervical spine disease    Coronary artery disease    S/p anterior MI tx with PCI in 1998  //  Myoview 6/22: EF 45, ant and septal scar; no ischemia; intermediate risk // Echocardiogram 7/22: EF 45-50, ant-sept and apical HK, Gr 1 DD, normal RVSF, mild MR   Hyperlipidemia    Hypertension    Ischemic cardiomyopathy 05/19/2022   Echocardiogram 06/02/22: EF 45-50, global HK, Gr 1 DD, NL RVSF, NL PASP (RVSP 26.4), trivial MR, RAP 3   Low back pain    Myocardial infarction ([place]) 1998   Sleep apnea    CPAP   Wears dentures    full upper    Past Surgical History:  Procedure Laterality Date   APPENDECTOMY     bunions     CATARACT EXTRACTION     COLONOSCOPY WITH PROPOFOL N/A 02/19/2015   Procedure: COLONOSCOPY WITH PROPOFOL;  Surgeon: Brian Lame, MD;  Location: [facility] ENDOSCOPY;  Service: Endoscopy;  Laterality: N/A;   COLONOSCOPY WITH PROPOFOL N/A 06/05/2019   Procedure: COLONOSCOPY WITH BIOPSY;  Surgeon: Brian Lame, MD;  Location: [place];  Service: Endoscopy;  Laterality: N/A;  sleep apnea   HEMORRHOID BANDING     HERNIA REPAIR     lumbar disc protrussion  03/2010   POLYPECTOMY N/A 06/05/2019   Procedure: POLYPECTOMY;  Surgeon: Brian Lame, MD;  Location: [place];  Service: Endoscopy;  Laterality: N/A;    Prior to Admission medications   Medication Sig Start Date End Date Taking? Authorizing Provider  atorvastatin (LIPITOR) 40 MG tablet Take 40 mg by mouth daily.     Yes [provider]  latanoprost (XALATAN) 0.005 % ophthalmic solution Place 1 drop into  the right eye at bedtime. 03/04/22  Yes [provider]  lisinopril (ZESTRIL) 20 MG tablet Take 1 tablet (20 mg total) by mouth daily. 07/22/22  Yes Belva Crome, MD  metoprolol succinate (TOPROL-XL) 25 MG 24 hr tablet TAKE 1 TABLET(25 MG) BY MOUTH DAILY 08/18/22  Yes Richardson Dopp T, PA-C  Oxycodone HCl 10 MG TABS Take 10-20 mg by mouth every 6 (six) hours as needed.   Yes [provider]  acetaminophen (TYLENOL) 325 MG tablet Take 325 mg by mouth daily as needed.    [provider]  aspirin 81 MG tablet Take 81 mg by mouth daily.      [provider]  Cholecalciferol (VITAMIN D3) 1.25 MG (50000 UT) CAPS Take 1 capsule by mouth once a week. Pt directed to stop taking after 3 weeks. 06/26/22   [provider]  ciprofloxacin (CIPRO) 500 MG tablet Take 1 tablet (500 mg total) by mouth 2 (two) times daily. 06/24/22   Brian Lame, MD  Coenzyme Q10 (COQ10) 100 MG CAPS Take 1 capsule by mouth daily.      [provider]  metroNIDAZOLE (FLAGYL) 500 MG tablet Take 1 tablet (500 mg total) by mouth 2 (two) times daily. 06/24/22   Brian Lame, MD  Multiple Vitamin (MULTIVITAMIN PO) Take 1 tablet by mouth daily.  [provider]  nitroGLYCERIN (NITROSTAT) 0.4 MG SL tablet Place 0.4 mg under the tongue every 5 (five) minutes as needed for chest pain.    [provider]    Allergies as of 07/31/2022   (No Known Allergies)    Family History  Problem Relation Age of Onset   Kidney disease Father    Hypertension Father    Heart failure Mother    Coronary artery disease Brother     Social History   Socioeconomic History   Marital status: Married    Spouse name: Brian [name]   Number of children: 1   Years of education: Not on file   Highest education level: Not on file  Occupational History   Occupation: retired    Comment: Chemical engineer  Tobacco Use   Smoking status: Every Day    Packs/day: 0.50    Years:  60.00    Total pack years: 30.00    Types: Cigarettes   Smokeless tobacco: Never   Tobacco comments:    1/2 3/4 ppd (since age 13)  67 Use   Vaping Use: Never used  Substance and Sexual Activity   Alcohol use: Not Currently    Alcohol/week: 0.0 standard drinks of alcohol    Comment: occasionally   Drug use: No   Sexual activity: Not on file  Other Topics Concern   Not on file  Social History Narrative   Not on file   Social Determinants of Health   Financial Resource Strain: Not on file  Food Insecurity: Not on file  Transportation Needs: Not on file  Physical Activity: Not on file  Stress: Not on file  Social Connections: Not on file  Intimate Partner Violence: Not on file    Review of Systems: See HPI, otherwise negative ROS  Physical Exam: BP (!) 147/93   Pulse 66   Temp 97.9 F (36.6 C) (Temporal)   Resp 16   Ht '5\' 10"'$  (1.778 m)   Wt 69.9 kg   SpO2 100%   BMI 22.10 kg/m  General:   Alert,  pleasant and cooperative in NAD Head:  Normocephalic and atraumatic. Neck:  Supple; no masses or thyromegaly. Lungs:  Clear throughout to auscultation.    Heart:  Regular rate and rhythm. Abdomen:  Soft, nontender and nondistended. Normal bowel sounds, without guarding, and without rebound.   Neurologic:  Alert and  oriented x4;  grossly normal neurologically.  Impression/Plan: Brian [name] is here for an ERCP to be performed for CBD stone  Risks, benefits, limitations, and alternatives regarding  ERCP have been reviewed with the patient.  Questions have been answered.  All parties agreeable.   Brian Lame, MD  08/25/2022, 9:40 AM

## 2022-08-25 NOTE — Anesthesia Preprocedure Evaluation (Signed)
Anesthesia Evaluation  Patient identified by MRN, date of birth, ID band Patient awake    Reviewed: Allergy & Precautions, NPO status , Patient's Chart, lab work & pertinent test results  History of Anesthesia Complications Negative for: history of anesthetic complications  Airway Mallampati: III   Neck ROM: Full    Dental  (+) Upper Dentures, Implants, Dental Advidsory Given, Caps   Pulmonary neg shortness of breath, sleep apnea and Continuous Positive Airway Pressure Ventilation , neg COPD, neg recent URI, Current SmokerPatient did not abstain from smoking.   Pulmonary exam normal breath sounds clear to auscultation       Cardiovascular Exercise Tolerance: Good hypertension, (-) angina + CAD (s/p MI 1998 and stents x2), + Past MI, + Cardiac Stents and + Peripheral Vascular Disease  Normal cardiovascular exam(-) dysrhythmias (-) Valvular Problems/Murmurs Rhythm:Regular Rate:Normal  AAA, 2.7cm in 2018   Neuro/Psych negative neurological ROS     GI/Hepatic negative GI ROS, Neg liver ROS,,,  Endo/Other  negative endocrine ROS    Renal/GU negative Renal ROS     Musculoskeletal   Abdominal   Peds  Hematology negative hematology ROS (+)   Anesthesia Other Findings Past Medical History: No date: Adenomatous polyp No date: Carpal tunnel syndrome on left No date: Cervical spine disease No date: Coronary artery disease     Comment:  S/p anterior MI tx with PCI in 1998  //  Myoview 6/22:               EF 45, ant and septal scar; no ischemia; intermediate               risk // Echocardiogram 7/22: EF 45-50, ant-sept and               apical HK, Gr 1 DD, normal RVSF, mild MR No date: Hyperlipidemia No date: Hypertension 05/19/2022: Ischemic cardiomyopathy     Comment:  Echocardiogram 06/02/22: EF 45-50, global HK, Gr 1 DD,               NL RVSF, NL PASP (RVSP 26.4), trivial MR, RAP 3 No date: Low back pain 1998:  Myocardial infarction ([place]) No date: Sleep apnea     Comment:  CPAP No date: Wears dentures     Comment:  full upper   Reproductive/Obstetrics                             Anesthesia Physical Anesthesia Plan  ASA: 3  Anesthesia Plan: General   Post-op Pain Management:    Induction: Intravenous  PONV Risk Score and Plan: 1 and Treatment may vary due to age or medical condition, Ondansetron and Dexamethasone  Airway Management Planned: Oral ETT  Additional Equipment:   Intra-op Plan:   Post-operative Plan: Extubation in OR  Informed Consent: I have reviewed the patients History and Physical, chart, labs and discussed the procedure including the risks, benefits and alternatives for the proposed anesthesia with the patient or authorized representative who has indicated his/her understanding and acceptance.       Plan Discussed with: CRNA  Anesthesia Plan Comments:         Anesthesia Quick Evaluation

## 2022-08-25 NOTE — Transfer of Care (Signed)
Immediate Anesthesia Transfer of Care Note  Patient: [name]  Procedure(s) Performed: ENDOSCOPIC RETROGRADE CHOLANGIOPANCREATOGRAPHY (ERCP)  Patient Location: PACU  Anesthesia Type:General  Level of Consciousness: awake  Airway & Oxygen Therapy: Patient Spontanous Breathing and Patient connected to face mask oxygen  Post-op Assessment: Report given to RN and Post -op Vital signs reviewed and stable  Post vital signs: Reviewed  Last Vitals:  Vitals Value Taken Time  BP 124/89   Temp 31F   Pulse 69 08/25/22 1041  Resp 10 08/25/22 1041  SpO2 100 % 08/25/22 1041  Vitals shown include unvalidated device data.  Last Pain:  Vitals:   08/25/22 0930  TempSrc: Temporal  PainSc: 0-No pain         Complications: No notable events documented.

## 2022-08-25 NOTE — Op Note (Signed)
[name] Patient Name: Brian [name] Procedure Date: 08/25/2022 9:51 AM MRN: [id number] Account #: [id number] Date of Birth: 1940-08-11 Admit Type: Outpatient Age: 82 Room: [facility] ENDO ROOM 4 Gender: Male Note Status: Finalized Instrument Name: Selinda Eon [id number] Procedure:             ERCP Indications:           Abnormal MRCP Providers:             Lucilla Lame MD, MD Referring MD:          Walker Shadow. Koleen Nimrod (Referring MD) Medicines:             General Anesthesia Complications:         No immediate complications. Procedure:             Pre-Anesthesia Assessment:                        - Prior to the procedure, a History and Physical was                         performed, and patient medications and allergies were                         reviewed. The patient's tolerance of previous                         anesthesia was also reviewed. The risks and benefits                         of the procedure and the sedation options and risks                         were discussed with the patient. All questions were                         answered, and informed consent was obtained. Prior                         Anticoagulants: The patient has taken no anticoagulant                         or antiplatelet agents. ASA Grade Assessment: II - A                         patient with mild systemic disease. After reviewing                         the risks and benefits, the patient was deemed in                         satisfactory condition to undergo the procedure.                        After obtaining informed consent, the scope was passed                         under direct vision. Throughout the procedure, the  patient's blood pressure, pulse, and oxygen                         saturations were monitored continuously. The                         Duodenoscope was introduced through the mouth, and                         used to  inject contrast into and used to inject                         contrast into the bile duct. The ERCP was accomplished                         without difficulty. The patient tolerated the                         procedure well. Findings:      The scout film was normal. The esophagus was successfully intubated       under direct vision. The scope was advanced to a normal major papilla in       the descending duodenum without detailed examination of the pharynx,       larynx and associated structures, and upper GI tract. The upper GI tract       was grossly normal. The bile duct was deeply cannulated with the       short-nosed traction sphincterotome. Contrast was injected. I personally       interpreted the bile duct images. There was brisk flow of contrast       through the ducts. Image quality was excellent. Contrast extended to the       entire biliary tree. A wire was passed into the biliary tree. An 8 mm       biliary sphincterotomy was made with a traction (standard)       sphincterotome using ERBE electrocautery. There was no       post-sphincterotomy bleeding. The biliary tree was swept with a 15 mm       balloon starting at the bifurcation. Sludge was swept from the duct. Impression:            - A biliary sphincterotomy was performed.                        - The biliary tree was swept and sludge was found. Recommendation:        - Discharge patient to home.                        - Clear liquid diet today.                        - Watch for pancreatitis, bleeding, perforation, and                         cholangitis. Procedure Code(s):     --- Professional ---                        [phone], Endoscopic retrograde cholangiopancreatography                         (  ERCP); with removal of calculi/debris from                         biliary/pancreatic duct(s)                        [phone], Endoscopic retrograde cholangiopancreatography                         (ERCP); with  sphincterotomy/papillotomy                        [phone], Endoscopic catheterization of the biliary                         ductal system, radiological supervision and                         interpretation Diagnosis Code(s):     --- Professional ---                        R93.2, Abnormal findings on diagnostic imaging of                         liver and biliary tract CPT copyright 2022 American Medical Association. All rights reserved. The codes documented in this report are preliminary and upon coder review may  be revised to meet current compliance requirements. Lucilla Lame MD, MD 08/25/2022 10:41:53 AM This report has been signed electronically. Number of Addenda: 0 Note Initiated On: 08/25/2022 9:51 AM Estimated Blood Loss:  Estimated blood loss: none.      [facility]

## 2022-08-26 ENCOUNTER — Other Ambulatory Visit: Payer: Self-pay

## 2022-08-26 ENCOUNTER — Emergency Department: Payer: Medicare Other

## 2022-08-26 ENCOUNTER — Encounter: Payer: Self-pay | Admitting: Gastroenterology

## 2022-08-26 ENCOUNTER — Telehealth: Payer: Self-pay

## 2022-08-26 ENCOUNTER — Inpatient Hospital Stay
Admission: EM | Admit: 2022-08-26 | Discharge: 2022-08-29 | DRG: 871 | Disposition: A | Discharge status: Home / Self Care | Payer: Medicare Other | Attending: Internal Medicine | Admitting: Internal Medicine

## 2022-08-26 DIAGNOSIS — R42 Dizziness and giddiness: Secondary | ICD-10-CM | POA: Diagnosis not present

## 2022-08-26 DIAGNOSIS — E873 Alkalosis: Secondary | ICD-10-CM | POA: Diagnosis present

## 2022-08-26 DIAGNOSIS — R0602 Shortness of breath: Secondary | ICD-10-CM | POA: Diagnosis not present

## 2022-08-26 DIAGNOSIS — G4710 Hypersomnia, unspecified: Secondary | ICD-10-CM | POA: Diagnosis present

## 2022-08-26 DIAGNOSIS — R6520 Severe sepsis without septic shock: Secondary | ICD-10-CM | POA: Diagnosis present

## 2022-08-26 DIAGNOSIS — F17210 Nicotine dependence, cigarettes, uncomplicated: Secondary | ICD-10-CM | POA: Diagnosis present

## 2022-08-26 DIAGNOSIS — I255 Ischemic cardiomyopathy: Secondary | ICD-10-CM | POA: Diagnosis present

## 2022-08-26 DIAGNOSIS — Z8249 Family history of ischemic heart disease and other diseases of the circulatory system: Secondary | ICD-10-CM | POA: Diagnosis not present

## 2022-08-26 DIAGNOSIS — A419 Sepsis, unspecified organism: Secondary | ICD-10-CM | POA: Diagnosis not present

## 2022-08-26 DIAGNOSIS — K769 Liver disease, unspecified: Secondary | ICD-10-CM | POA: Diagnosis not present

## 2022-08-26 DIAGNOSIS — Z7982 Long term (current) use of aspirin: Secondary | ICD-10-CM

## 2022-08-26 DIAGNOSIS — Z841 Family history of disorders of kidney and ureter: Secondary | ICD-10-CM | POA: Diagnosis not present

## 2022-08-26 DIAGNOSIS — E871 Hypo-osmolality and hyponatremia: Secondary | ICD-10-CM | POA: Diagnosis present

## 2022-08-26 DIAGNOSIS — I252 Old myocardial infarction: Secondary | ICD-10-CM | POA: Diagnosis not present

## 2022-08-26 DIAGNOSIS — Z79899 Other long term (current) drug therapy: Secondary | ICD-10-CM

## 2022-08-26 DIAGNOSIS — K8032 Calculus of bile duct with acute cholangitis without obstruction: Secondary | ICD-10-CM | POA: Diagnosis present

## 2022-08-26 DIAGNOSIS — K8309 Other cholangitis: Secondary | ICD-10-CM

## 2022-08-26 DIAGNOSIS — I25119 Atherosclerotic heart disease of native coronary artery with unspecified angina pectoris: Secondary | ICD-10-CM | POA: Diagnosis present

## 2022-08-26 DIAGNOSIS — I10 Essential (primary) hypertension: Secondary | ICD-10-CM | POA: Diagnosis present

## 2022-08-26 DIAGNOSIS — K828 Other specified diseases of gallbladder: Secondary | ICD-10-CM | POA: Diagnosis not present

## 2022-08-26 DIAGNOSIS — I2693 Single subsegmental pulmonary embolism without acute cor pulmonale: Secondary | ICD-10-CM | POA: Diagnosis present

## 2022-08-26 DIAGNOSIS — I2699 Other pulmonary embolism without acute cor pulmonale: Secondary | ICD-10-CM

## 2022-08-26 DIAGNOSIS — N179 Acute kidney failure, unspecified: Secondary | ICD-10-CM | POA: Diagnosis present

## 2022-08-26 DIAGNOSIS — A4151 Sepsis due to Escherichia coli [E. coli]: Secondary | ICD-10-CM | POA: Diagnosis present

## 2022-08-26 DIAGNOSIS — R932 Abnormal findings on diagnostic imaging of liver and biliary tract: Secondary | ICD-10-CM

## 2022-08-26 DIAGNOSIS — H409 Unspecified glaucoma: Secondary | ICD-10-CM | POA: Diagnosis present

## 2022-08-26 DIAGNOSIS — I739 Peripheral vascular disease, unspecified: Secondary | ICD-10-CM | POA: Diagnosis present

## 2022-08-26 DIAGNOSIS — G8929 Other chronic pain: Secondary | ICD-10-CM | POA: Diagnosis present

## 2022-08-26 DIAGNOSIS — G4733 Obstructive sleep apnea (adult) (pediatric): Secondary | ICD-10-CM | POA: Diagnosis present

## 2022-08-26 DIAGNOSIS — I700 Atherosclerosis of aorta: Secondary | ICD-10-CM | POA: Diagnosis not present

## 2022-08-26 DIAGNOSIS — R109 Unspecified abdominal pain: Secondary | ICD-10-CM | POA: Diagnosis not present

## 2022-08-26 DIAGNOSIS — E785 Hyperlipidemia, unspecified: Secondary | ICD-10-CM | POA: Diagnosis present

## 2022-08-26 DIAGNOSIS — J439 Emphysema, unspecified: Secondary | ICD-10-CM | POA: Diagnosis not present

## 2022-08-26 DIAGNOSIS — R001 Bradycardia, unspecified: Secondary | ICD-10-CM | POA: Diagnosis not present

## 2022-08-26 DIAGNOSIS — M545 Low back pain, unspecified: Secondary | ICD-10-CM | POA: Diagnosis present

## 2022-08-26 DIAGNOSIS — R519 Headache, unspecified: Secondary | ICD-10-CM | POA: Diagnosis not present

## 2022-08-26 HISTORY — DX: Other pulmonary embolism without acute cor pulmonale: I26.99

## 2022-08-26 LAB — URINALYSIS, ROUTINE W REFLEX MICROSCOPIC
Bacteria, UA: NONE SEEN
Bilirubin Urine: NEGATIVE
Glucose, UA: NEGATIVE mg/dL
Ketones, ur: NEGATIVE mg/dL
Leukocytes,Ua: NEGATIVE
Nitrite: NEGATIVE
Protein, ur: NEGATIVE mg/dL
Specific Gravity, Urine: 1.031 — ABNORMAL HIGH (ref 1.005–1.030)
pH: 5 (ref 5.0–8.0)

## 2022-08-26 LAB — LACTIC ACID, PLASMA
Lactic Acid, Venous: 2.2 mmol/L (ref 0.5–1.9)
Lactic Acid, Venous: 2.2 mmol/L (ref 0.5–1.9)

## 2022-08-26 LAB — COMPREHENSIVE METABOLIC PANEL
ALT: 267 U/L — ABNORMAL HIGH (ref 0–44)
AST: 211 U/L — ABNORMAL HIGH (ref 15–41)
Albumin: 3.3 g/dL — ABNORMAL LOW (ref 3.5–5.0)
Alkaline Phosphatase: 117 U/L (ref 38–126)
Anion gap: 9 (ref 5–15)
BUN: 29 mg/dL — ABNORMAL HIGH (ref 8–23)
CO2: 22 mmol/L (ref 22–32)
Calcium: 8.5 mg/dL — ABNORMAL LOW (ref 8.9–10.3)
Chloride: 96 mmol/L — ABNORMAL LOW (ref 98–111)
Creatinine, Ser: 1.74 mg/dL — ABNORMAL HIGH (ref 0.61–1.24)
GFR, Estimated: 39 mL/min — ABNORMAL LOW (ref >60)
Glucose, Bld: 112 mg/dL — ABNORMAL HIGH (ref 70–99)
Potassium: 4.8 mmol/L (ref 3.5–5.1)
Sodium: 127 mmol/L — ABNORMAL LOW (ref 135–145)
Total Bilirubin: 2.5 mg/dL — ABNORMAL HIGH (ref 0.3–1.2)
Total Protein: 6.6 g/dL (ref 6.5–8.1)

## 2022-08-26 LAB — CBC WITH DIFFERENTIAL/PLATELET
Abs Immature Granulocytes: 0.4 10*3/uL — ABNORMAL HIGH (ref 0.00–0.07)
Basophils Absolute: 0.1 10*3/uL (ref 0.0–0.1)
Basophils Relative: 0 %
Eosinophils Absolute: 0.1 10*3/uL (ref 0.0–0.5)
Eosinophils Relative: 0 %
HCT: 40.2 % (ref 39.0–52.0)
Hemoglobin: 13.5 g/dL (ref 13.0–17.0)
Immature Granulocytes: 2 %
Lymphocytes Relative: 4 %
Lymphs Abs: 1.1 10*3/uL (ref 0.7–4.0)
MCH: 30.3 pg (ref 26.0–34.0)
MCHC: 33.6 g/dL (ref 30.0–36.0)
MCV: 90.3 fL (ref 80.0–100.0)
Monocytes Absolute: 1.6 10*3/uL — ABNORMAL HIGH (ref 0.1–1.0)
Monocytes Relative: 7 %
Neutro Abs: 21.7 10*3/uL — ABNORMAL HIGH (ref 1.7–7.7)
Neutrophils Relative %: 87 %
Platelets: 152 10*3/uL (ref 150–400)
RBC: 4.45 MIL/uL (ref 4.22–5.81)
RDW: 12.8 % (ref 11.5–15.5)
Smear Review: NORMAL
WBC Morphology: INCREASED
WBC: 25 10*3/uL — ABNORMAL HIGH (ref 4.0–10.5)
nRBC: 0 % (ref 0.0–0.2)

## 2022-08-26 LAB — APTT: aPTT: 24 seconds (ref 24–36)

## 2022-08-26 LAB — MAGNESIUM: Magnesium: 1.9 mg/dL (ref 1.7–2.4)

## 2022-08-26 LAB — TROPONIN I (HIGH SENSITIVITY): Troponin I (High Sensitivity): 35 ng/L — ABNORMAL HIGH (ref <18)

## 2022-08-26 LAB — LIPASE, BLOOD: Lipase: 121 U/L — ABNORMAL HIGH (ref 11–51)

## 2022-08-26 LAB — PROTIME-INR
INR: 1.2 (ref 0.8–1.2)
Prothrombin Time: 15.2 seconds (ref 11.4–15.2)

## 2022-08-26 MED ORDER — LACTATED RINGERS IV BOLUS
1000.0000 mL | Freq: Once | INTRAVENOUS | Status: AC
  Administered 2022-08-26: 1000 mL via INTRAVENOUS

## 2022-08-26 MED ORDER — ACETAMINOPHEN 325 MG PO TABS
650.0000 mg | ORAL_TABLET | Freq: Four times a day (QID) | ORAL | Status: DC | PRN
  Administered 2022-08-27: 650 mg via ORAL
  Filled 2022-08-26: qty 2

## 2022-08-26 MED ORDER — LACTATED RINGERS IV BOLUS (SEPSIS)
1000.0000 mL | Freq: Once | INTRAVENOUS | Status: AC
  Administered 2022-08-26: 1000 mL via INTRAVENOUS

## 2022-08-26 MED ORDER — IOHEXOL 350 MG/ML SOLN
75.0000 mL | Freq: Once | INTRAVENOUS | Status: AC | PRN
  Administered 2022-08-26: 75 mL via INTRAVENOUS

## 2022-08-26 MED ORDER — LACTATED RINGERS IV SOLN: INTRAVENOUS | Status: DC

## 2022-08-26 MED ORDER — MORPHINE SULFATE (PF) 2 MG/ML IV SOLN
2.0000 mg | INTRAVENOUS | Status: DC | PRN
  Administered 2022-08-26 – 2022-08-28 (×7): 2 mg via INTRAVENOUS
  Filled 2022-08-26 (×7): qty 1

## 2022-08-26 MED ORDER — ONDANSETRON HCL 4 MG/2ML IJ SOLN
4.0000 mg | Freq: Four times a day (QID) | INTRAMUSCULAR | Status: DC | PRN
  Administered 2022-08-27: 4 mg via INTRAVENOUS
  Filled 2022-08-26: qty 2

## 2022-08-26 MED ORDER — ACETAMINOPHEN 650 MG RE SUPP: 650.0000 mg | Freq: Four times a day (QID) | RECTAL | Status: DC | PRN

## 2022-08-26 MED ORDER — LACTATED RINGERS IV BOLUS (SEPSIS)
250.0000 mL | Freq: Once | INTRAVENOUS | Status: AC
  Administered 2022-08-26: 250 mL via INTRAVENOUS

## 2022-08-26 MED ORDER — METRONIDAZOLE 500 MG/100ML IV SOLN
500.0000 mg | Freq: Once | INTRAVENOUS | Status: DC
  Administered 2022-08-26: 500 mg via INTRAVENOUS
  Filled 2022-08-26: qty 100

## 2022-08-26 MED ORDER — PIPERACILLIN-TAZOBACTAM 3.375 G IVPB
3.3750 g | Freq: Three times a day (TID) | INTRAVENOUS | Status: DC
  Administered 2022-08-27 – 2022-08-29 (×7): 3.375 g via INTRAVENOUS
  Filled 2022-08-26 (×7): qty 50

## 2022-08-26 MED ORDER — ALBUTEROL SULFATE (2.5 MG/3ML) 0.083% IN NEBU: 2.5000 mg | INHALATION_SOLUTION | RESPIRATORY_TRACT | Status: DC | PRN

## 2022-08-26 MED ORDER — HYDRALAZINE HCL 20 MG/ML IJ SOLN: 10.0000 mg | Freq: Four times a day (QID) | INTRAMUSCULAR | Status: DC | PRN

## 2022-08-26 MED ORDER — HEPARIN (PORCINE) 25000 UT/250ML-% IV SOLN
1650.0000 [IU]/h | INTRAVENOUS | Status: DC
  Administered 2022-08-26 – 2022-08-27 (×2): 1150 [IU]/h via INTRAVENOUS
  Administered 2022-08-28: 1350 [IU]/h via INTRAVENOUS
  Filled 2022-08-26 (×4): qty 250

## 2022-08-26 MED ORDER — ONDANSETRON HCL 4 MG PO TABS: 4.0000 mg | ORAL_TABLET | Freq: Four times a day (QID) | ORAL | Status: DC | PRN

## 2022-08-26 MED ORDER — HEPARIN BOLUS VIA INFUSION
4000.0000 [IU] | Freq: Once | INTRAVENOUS | Status: AC
  Administered 2022-08-26: 4000 [IU] via INTRAVENOUS
  Filled 2022-08-26: qty 4000

## 2022-08-26 MED ORDER — PIPERACILLIN-TAZOBACTAM 3.375 G IVPB
3.3750 g | Freq: Three times a day (TID) | INTRAVENOUS | Status: DC
  Administered 2022-08-26: 3.375 g via INTRAVENOUS
  Filled 2022-08-26: qty 50

## 2022-08-26 MED ORDER — OXYCODONE HCL 5 MG PO TABS
10.0000 mg | ORAL_TABLET | Freq: Four times a day (QID) | ORAL | Status: DC | PRN
  Administered 2022-08-26 – 2022-08-27 (×2): 10 mg via ORAL
  Filled 2022-08-26 (×2): qty 2

## 2022-08-26 MED ORDER — LATANOPROST 0.005 % OP SOLN
1.0000 [drp] | Freq: Every day | OPHTHALMIC | Status: DC
  Administered 2022-08-26 – 2022-08-28 (×3): 1 [drp] via OPHTHALMIC
  Filled 2022-08-26 (×2): qty 2.5

## 2022-08-26 MED ORDER — SODIUM CHLORIDE 0.9 % IV SOLN
2.0000 g | Freq: Once | INTRAVENOUS | Status: AC
  Administered 2022-08-26: 2 g via INTRAVENOUS
  Filled 2022-08-26: qty 12.5

## 2022-08-26 MED ORDER — BENZONATATE 100 MG PO CAPS: 200.0000 mg | ORAL_CAPSULE | Freq: Three times a day (TID) | ORAL | Status: DC | PRN

## 2022-08-26 MED ORDER — VANCOMYCIN HCL IN DEXTROSE 1-5 GM/200ML-% IV SOLN: 1000.0000 mg | Freq: Once | INTRAVENOUS | Status: DC

## 2022-08-26 NOTE — Progress Notes (Signed)
Patient returned ENDO follow-up call after ERCP 08-25-22 with Dr Allen Norris.  Patient states that he has developed a dry cough and experiences chest discomfort in his lower rib cage area with each cough.  Denies fever, shortness of breath, and the cough is non-productive.  He states that his daughter says he appears to have labored breathing, but he himself does not feel that he is short of breath or having difficulty getting his breath.  No abdominal discomfort/pain, tolerating prescribed diet without nausea.  I have encouraged the patient to call his medical doctor or go to an Urgent Care for evaluation of the cough and chest discomfort and to also splint his chest with each cough to decrease his discomfort.  He and his wife stated they were going to see how he feels and how he does through the early afternoon and will be in touch with medical MD or an Urgent Care if his symptoms do not improve today or if he develops a fever or his cough becomes productive.

## 2022-08-26 NOTE — Sepsis Progress Note (Signed)
Confirmed with bedside RN Caryl Pina that blood cultures were drawn before the antibiotic was given.

## 2022-08-26 NOTE — Consult Note (Signed)
Pharmacy Antibiotic Note  Brian [name] is a 82 y.o. male admitted on 08/26/2022 with  intrabdominal infection .  Pharmacy has been consulted for zosyn dosing.  Plan: Zosyn 3.375g IV q8h (4 hour infusion).     Temp (24hrs), Avg:98.1 F (36.7 C), Min:98.1 F (36.7 C), Max:98.1 F (36.7 C)  Recent Labs  Lab 08/26/22 1501 08/26/22 1508  WBC 25.0*  --   CREATININE 1.74*  --   LATICACIDVEN  --  2.2*    Estimated Creatinine Clearance: 32.4 mL/min (A) (by C-G formula based on SCr of 1.74 mg/dL (H)).    No Known Allergies  Antimicrobials this admission: 2/7 zosyn   Microbiology results: 2/7 BCx: sent  Thank you for allowing pharmacy to be a part of this patient's care.  Darrick Penna 08/26/2022 5:27 PM

## 2022-08-26 NOTE — Telephone Encounter (Signed)
Pt reports that he is feeling lightheaded and dizzy. Pt states her BP this morning was 60/43 with heart rate of 54, rechecked 1 hour ago, it was 86/48 heart rate 49  Pt denies feeling SOB of fever  Pt was advised to go to ED now and he said he is waiting for his daughter to pick him up now and should be there within 30 mins  Called ED triage to give heads up of pt coming

## 2022-08-26 NOTE — Sepsis Progress Note (Addendum)
eLink is following this Code Sepsis.  [place] Notified provider of need to order lactic acid and blood cultures.

## 2022-08-26 NOTE — ED Triage Notes (Signed)
Patient to ED for post-op problem. Patient had procedure with Dr Verl Blalock for EGD. Now having dizziness, hypotension, and lightheadedness. States SOB that started last PM. BP's of 86/48 and 60/43 at home PTA. Denies CP.

## 2022-08-26 NOTE — ED Provider Notes (Signed)
[facility] Provider Note    Event Date/Time   First MD Initiated Contact with Patient 08/26/22 1502     (approximate)   History   Chief Complaint Post-op Problem   HPI  Brian [name] is a 82 y.o. male with past medical history of hypertension, hyperlipidemia, CAD, and choledocholithiasis who presents to the ED complaining of postop problem.  Patient reports that he underwent ERCP as an outpatient yesterday with Dr. Allen Norris, was not found to have any biliary stones at that time, underwent biliary sphincterotomy.  He states he was feeling well when he left the outpatient surgical center, but overnight began to have a feeling of inability to catch his breath.  He reports some occasional coughing as well as pain in his upper abdomen and chest while taking a deep breath.  Pain has been present intermittently and he currently denies any abdominal or chest pain, does state it still feels like he cannot take a full breath.  He began feeling dizzy and lightheaded earlier today, checked his blood pressure multiple times at home and found it to be low.  He spoke with Dr. Dorothey Baseman office, who recommended he come to the ED for further evaluation.     Physical Exam   Triage Vital Signs: ED Triage Vitals  Enc Vitals Group     BP 08/26/22 1454 (!) 68/31     Pulse Rate 08/26/22 1454 (!) 52     Resp 08/26/22 1454 18     Temp 08/26/22 1454 98.1 F (36.7 C)     Temp Source 08/26/22 1454 Oral     SpO2 08/26/22 1454 99 %     Weight --      Height --      Head Circumference --      Peak Flow --      Pain Score 08/26/22 1452 0     Pain Loc --      Pain Edu? --      Excl. in [place]? --     Most recent vital signs: Vitals:   08/26/22 1630 08/26/22 1640  BP: (!) 84/37 123/67  Pulse: (!) 48 (!) 49  Resp: 17 (!) 9  Temp:    SpO2: 96% 95%    Constitutional: Alert and oriented. Eyes: Conjunctivae are normal. Head: Atraumatic. Nose: No congestion/rhinnorhea. Mouth/Throat:  Mucous membranes are moist.  Cardiovascular: Bradycardic, regular rhythm. Grossly normal heart sounds.  2+ radial pulses bilaterally. Respiratory: Normal respiratory effort.  No retractions. Lungs CTAB. Gastrointestinal: Soft and nontender. No distention. Musculoskeletal: No lower extremity tenderness nor edema.  Neurologic:  Normal speech and language. No gross focal neurologic deficits are appreciated.    ED Results / Procedures / Treatments   Labs (all labs ordered are listed, but only abnormal results are displayed) Labs Reviewed  CBC WITH DIFFERENTIAL/PLATELET - Abnormal; Notable for the following components:      Result Value   WBC 25.0 (*)    Neutro Abs 21.7 (*)    Monocytes Absolute 1.6 (*)    Abs Immature Granulocytes 0.40 (*)    All other components within normal limits  COMPREHENSIVE METABOLIC PANEL - Abnormal; Notable for the following components:   Sodium 127 (*)    Chloride 96 (*)    Glucose, Bld 112 (*)    BUN 29 (*)    Creatinine, Ser 1.74 (*)    Calcium 8.5 (*)    Albumin 3.3 (*)    AST 211 (*)  ALT 267 (*)    Total Bilirubin 2.5 (*)    GFR, Estimated 39 (*)    All other components within normal limits  LIPASE, BLOOD - Abnormal; Notable for the following components:   Lipase 121 (*)    All other components within normal limits  LACTIC ACID, PLASMA - Abnormal; Notable for the following components:   Lactic Acid, Venous 2.2 (*)    All other components within normal limits  TROPONIN I (HIGH SENSITIVITY) - Abnormal; Notable for the following components:   Troponin I (High Sensitivity) 35 (*)    All other components within normal limits  CULTURE, BLOOD (ROUTINE X 2)  CULTURE, BLOOD (ROUTINE X 2)  MAGNESIUM  URINALYSIS, ROUTINE W REFLEX MICROSCOPIC  LACTIC ACID, PLASMA  APTT  PROTIME-INR  CBC  HEPARIN LEVEL (UNFRACTIONATED)  TROPONIN I (HIGH SENSITIVITY)     EKG  ED ECG REPORT I, Blake Divine, the attending physician, personally viewed and  interpreted this ECG.   Date: 08/26/2022  EKG Time: 14:59  Rate: 53  Rhythm: Junctional rhythm  Axis: LAD  Intervals:none  ST&T Change: Lateral T wave inversions  RADIOLOGY Chest x-ray reviewed and interpreted by me with no infiltrate, edema, or effusion.  PROCEDURES:  Critical Care performed: Yes, see critical care procedure note(s)  .Critical Care  Performed by: Blake Divine, MD Authorized by: Blake Divine, MD   Critical care provider statement:    Critical care time (minutes):  30   Critical care time was exclusive of:  Separately billable procedures and treating other patients and teaching time   Critical care was necessary to treat or prevent imminent or life-threatening deterioration of the following conditions:  Sepsis and shock   Critical care was time spent personally by me on the following activities:  Development of treatment plan with patient or surrogate, discussions with consultants, evaluation of patient's response to treatment, examination of patient, ordering and review of laboratory studies, ordering and review of radiographic studies, ordering and performing treatments and interventions, pulse oximetry, re-evaluation of patient's condition and review of old charts   I assumed direction of critical care for this patient from another provider in my specialty: no     Care discussed with: admitting provider      MEDICATIONS ORDERED IN ED: Medications  lactated ringers bolus 1,000 mL (has no administration in time range)    And  lactated ringers bolus 250 mL (has no administration in time range)  metroNIDAZOLE (FLAGYL) IVPB 500 mg (500 mg Intravenous New Bag/Given 08/26/22 1647)  vancomycin (VANCOCIN) IVPB 1000 mg/200 mL premix (has no administration in time range)  heparin bolus via infusion 4,000 Units (has no administration in time range)    Followed by  heparin ADULT infusion 100 units/mL (25000 units/240m) (has no administration in time range)   lactated ringers bolus 1,000 mL (0 mLs Intravenous Stopped 08/26/22 1600)  ceFEPIme (MAXIPIME) 2 g in sodium chloride 0.9 % 100 mL IVPB (0 g Intravenous Stopped 08/26/22 1644)  lactated ringers bolus 1,000 mL (1,000 mLs Intravenous New Bag/Given 08/26/22 1601)  iohexol (OMNIPAQUE) 350 MG/ML injection 75 mL (75 mLs Intravenous Contrast Given 08/26/22 1607)     IMPRESSION / [place]  I reviewed the triage vital signs and the nursing notes.                              82y.o. male with past medical history  of hypertension, hyperlipidemia, CAD, and choledocholithiasis who presents to the ED with difficulty catching his breath, intermittent chest and abdominal discomfort following outpatient ERCP yesterday.  Patient's presentation is most consistent with acute presentation with potential threat to life or bodily function.  Differential diagnosis includes, but is not limited to, perforation, sepsis, cholangitis, aspiration, pneumonia, bradycardia, electrolyte abnormality, AKI.  Patient nontoxic-appearing and in no acute distress,, however vital signs concerning for bradycardia and hypotension.  Patient with heart rate varying from the mid 40s to the 60s, appears to be varying from junctional rhythm to sinus bradycardia, but EKG consistent with junctional rhythm.  I suspect bradycardia is a response to another process rather than the primary driver of his shock and we will hold off on atropine for now, resuscitate with IV fluids.  Initial labs show marked leukocytosis and I suspect patient developing sepsis either due to perforation or cholangitis, will start on broad-spectrum antibiotics.  We will further assess with CTA of his chest as well as CT of his abdomen/pelvis.  Chest x-ray is unremarkable with no free air under the diaphragm.  Blood pressure improved following IV fluid resuscitation, patient remains bradycardic but I doubt this is contributing to his shock.  CT imaging  shows no evidence of perforation, does show enhancing nodules throughout the liver that we will further assess with right upper quadrant ultrasound.  CTA is also positive for small PE but no evidence of right heart strain associated with this.  We will start patient on IV heparin, he has already received broad-spectrum antibiotics.  Labs with transaminitis and elevation in bilirubin consistent with cholangitis.  He also has significant AKI and hyponatremia lactic acid elevated and we will trend following IV fluids.  Case discussed with hospitalist for admission.      FINAL CLINICAL IMPRESSION(S) / ED DIAGNOSES   Final diagnoses:  Sepsis without acute organ dysfunction, due to unspecified organism [facility])  Cholangitis  Single subsegmental pulmonary embolism without acute cor pulmonale ([place])     Rx / DC Orders   ED Discharge Orders     None        Note:  This document was prepared using Dragon voice recognition software and may include unintentional dictation errors.   Blake Divine, MD 08/26/22 [phone]

## 2022-08-26 NOTE — Consult Note (Signed)
[place] for IV Heparin Indication: pulmonary embolus  Patient Measurements:   Heparin Dosing Weight: 69.9 kg  Labs: Recent Labs    08/26/22 1501  HGB 13.5  HCT 40.2  PLT 152  CREATININE 1.74*  TROPONINIHS 35*    Estimated Creatinine Clearance: 32.4 mL/min (A) (by C-G formula based on SCr of 1.74 mg/dL (H)).   Medical History: Past Medical History:  Diagnosis Date   Adenomatous polyp    Carpal tunnel syndrome on left    Cervical spine disease    Coronary artery disease    S/p anterior MI tx with PCI in 1998  //  Myoview 6/22: EF 45, ant and septal scar; no ischemia; intermediate risk // Echocardiogram 7/22: EF 45-50, ant-sept and apical HK, Gr 1 DD, normal RVSF, mild MR   Hyperlipidemia    Hypertension    Ischemic cardiomyopathy 05/19/2022   Echocardiogram 06/02/22: EF 45-50, global HK, Gr 1 DD, NL RVSF, NL PASP (RVSP 26.4), trivial MR, RAP 3   Low back pain    Myocardial infarction ([place]) 1998   Sleep apnea    CPAP   Wears dentures    full upper    Medications:  No anticoagulation prior to admission per my chart review. Medication reconciliation is pending  Assessment: 82 y/o M presenting to the ED 2/7 for dizziness, hypotension, lightheadedness. Patient had ERCP 2/6. Incidentally found to have acute PE with low clot burden. Pharmacy consulted to initiate and manage heparin infusion for acute PE.  Baseline aPTT and PT-INR are pending. Baseline H&H, platelets within normal limits.  Goal of Therapy:  Heparin level 0.3-0.7 units/ml Monitor platelets by anticoagulation protocol: Yes   Plan:  --Heparin 4000 unit IV bolus followed by continuous infusion at 1150 units/hr --HL 8 hours from initiation of infusion --Daily CBC per protocol while on IV heparin  Brian [name] 08/26/2022,5:03 PM

## 2022-08-26 NOTE — H&P (Signed)
History and Physical    Brian [name] IBB:[id number] DOB: 06-12-1941 DOA: 08/26/2022  PCP: [name], MD  Patient coming from: Home  I have personally briefly reviewed patient's old medical records in [place]  Chief Complaint: Shortness of breath  HPI: Brian [name] is a 82 y.o. male with medical history significant of hypertension, CAD, hyperlipidemia, glaucoma who presents for evaluation 1 day after outpatient ERCP.  During ERCP biliary asymptomatically was performed and biliary sludge was noted.  Biliary tree was swept with no stones and patient was discharged home.  Subsequently in the next 24 hours patient developed dry cough and progressive shortness of breath.  He contacted gastroenterology office who directed him to the emergency department.  Upon presentation to the emergency department patient has elevated LFTs with increased white blood cell count associate with hypotension.  Hypotension was treated with 2 L bolus isotonic fluids.  Good improvement blood pressure was noted.  CT abdomen performed did not demonstrate any signs of perforation.  Incidentally a CT angio chest was performed due to shortness of breath.  Found to have a small nonocclusive subsegmental pulmonary embolism and was started on heparin gtt.  Considering working diagnosis of ascending cholangitis post ERCP as well as incidental discovery of segmental pulmonary embolism admission request was made.  On my evaluation patient is resting comfortably in bed.  He is in no visible discomfort.  He answers all questions appropriately.  He denies any abdominal pain.  Shortness of breath has improved since admission.  ED Course: Patient received 2 L fluid bolus with good improvement in blood pressure.  Initial lactic acid 2.2.  Repeat pending at time of this note.  Patient was given empiric intravenous antibiotics vancomycin, cefepime, Flagyl.  Blood cultures were drawn before initiation of antibiotics.   Clinical presentation is consistent with acute cholangitis post ERCP.  Hospitalist contacted for admission.  Review of Systems: As per HPI otherwise 14 point review of systems negative.    Past Medical History:  Diagnosis Date   Adenomatous polyp    Carpal tunnel syndrome on left    Cervical spine disease    Coronary artery disease    S/p anterior MI tx with PCI in 1998  //  Myoview 6/22: EF 45, ant and septal scar; no ischemia; intermediate risk // Echocardiogram 7/22: EF 45-50, ant-sept and apical HK, Gr 1 DD, normal RVSF, mild MR   Hyperlipidemia    Hypertension    Ischemic cardiomyopathy 05/19/2022   Echocardiogram 06/02/22: EF 45-50, global HK, Gr 1 DD, NL RVSF, NL PASP (RVSP 26.4), trivial MR, RAP 3   Low back pain    Myocardial infarction ([place]) 1998   Sleep apnea    CPAP   Wears dentures    full upper    Past Surgical History:  Procedure Laterality Date   APPENDECTOMY     bunions     CATARACT EXTRACTION     COLONOSCOPY WITH PROPOFOL N/A 02/19/2015   Procedure: COLONOSCOPY WITH PROPOFOL;  Surgeon: Lucilla Lame, MD;  Location: [facility] ENDOSCOPY;  Service: Endoscopy;  Laterality: N/A;   COLONOSCOPY WITH PROPOFOL N/A 06/05/2019   Procedure: COLONOSCOPY WITH BIOPSY;  Surgeon: Lucilla Lame, MD;  Location: [facility];  Service: Endoscopy;  Laterality: N/A;  sleep apnea   ERCP N/A 08/25/2022   Procedure: ENDOSCOPIC RETROGRADE CHOLANGIOPANCREATOGRAPHY (ERCP);  Surgeon: Lucilla Lame, MD;  Location: [facility] ENDOSCOPY;  Service: Endoscopy;  Laterality: N/A;   HEMORRHOID BANDING     HERNIA REPAIR  lumbar disc protrussion  03/2010   POLYPECTOMY N/A 06/05/2019   Procedure: POLYPECTOMY;  Surgeon: Lucilla Lame, MD;  Location: [facility];  Service: Endoscopy;  Laterality: N/A;     reports that he has been smoking cigarettes. He has a 30.00 pack-year smoking history. He has never used smokeless tobacco. He reports that he does not currently use alcohol. He reports that he does  not use drugs.  No Known Allergies  Family History  Problem Relation Age of Onset   Kidney disease Father    Hypertension Father    Heart failure Mother    Coronary artery disease Brother    No family history of gallstones or ascending colon  Prior to Admission medications   Medication Sig Start Date End Date Taking? Authorizing Provider  acetaminophen (TYLENOL) 325 MG tablet Take 325 mg by mouth daily as needed.    [provider]  aspirin 81 MG tablet Take 81 mg by mouth daily.      [provider]  atorvastatin (LIPITOR) 40 MG tablet Take 40 mg by mouth daily.      [provider]  Cholecalciferol (VITAMIN D3) 1.25 MG (50000 UT) CAPS Take 1 capsule by mouth once a week. Pt directed to stop taking after 3 weeks. 06/26/22   [provider]  Coenzyme Q10 (COQ10) 100 MG CAPS Take 1 capsule by mouth daily.      [provider]  latanoprost (XALATAN) 0.005 % ophthalmic solution Place 1 drop into the right eye at bedtime. 03/04/22   [provider]  lisinopril (ZESTRIL) 20 MG tablet Take 1 tablet (20 mg total) by mouth daily. 07/22/22   Belva Crome, MD  metoprolol succinate (TOPROL-XL) 25 MG 24 hr tablet TAKE 1 TABLET(25 MG) BY MOUTH DAILY 08/18/22   Richardson Dopp T, PA-C  Multiple Vitamin (MULTIVITAMIN PO) Take 1 tablet by mouth daily.    [provider]  nitroGLYCERIN (NITROSTAT) 0.4 MG SL tablet Place 0.4 mg under the tongue every 5 (five) minutes as needed for chest pain.    [provider]  Oxycodone HCl 10 MG TABS Take 10-20 mg by mouth every 6 (six) hours as needed.    [provider]    Physical Exam: Vitals:   08/26/22 1600 08/26/22 1625 08/26/22 1630 08/26/22 1640  BP: 91/61 (!) 90/56 (!) 84/37 123/67  Pulse: (!) 46 (!) 42 (!) 48 (!) 49  Resp: '16 16 17 '$ (!) 9  Temp:      TempSrc:      SpO2: 96% 100% 96% 95%    Vitals:   08/26/22 1600 08/26/22 1625 08/26/22 1630 08/26/22 1640  BP: 91/61 (!)  90/56 (!) 84/37 123/67  Pulse: (!) 46 (!) 42 (!) 48 (!) 49  Resp: '16 16 17 '$ (!) 9  Temp:      TempSrc:      SpO2: 96% 100% 96% 95%   General: No apparent distress, patient appears well HEENT: Normocephalic, atraumatic Neck, supple, trachea midline, no tenderness Heart: Bradycardic, regular rhythm, no murmurs, no pedal edema Lungs: Clear to auscultation bilaterally, no adventitious sounds, normal work of breathing Abdomen: Soft, NT/ND, normal bowel sounds Extremities: Normal, atraumatic, no clubbing or cyanosis, normal muscle tone Skin: No rashes or lesions, normal color Neurologic: Cranial nerves grossly intact, sensation intact, alert and oriented x3 Psychiatric: Normal affect   Labs on Admission: I have personally reviewed following labs and imaging studies  CBC: Recent Labs  Lab 08/26/22 1501  WBC 25.0*  NEUTROABS 21.7*  HGB 13.5  HCT 40.2  MCV 90.3  PLT 161   Basic Metabolic Panel: Recent Labs  Lab 08/26/22 1501  NA 127*  K 4.8  CL 96*  CO2 22  GLUCOSE 112*  BUN 29*  CREATININE 1.74*  CALCIUM 8.5*  MG 1.9   GFR: Estimated Creatinine Clearance: 32.4 mL/min (A) (by C-G formula based on SCr of 1.74 mg/dL (H)). Liver Function Tests: Recent Labs  Lab 08/26/22 1501  AST 211*  ALT 267*  ALKPHOS 117  BILITOT 2.5*  PROT 6.6  ALBUMIN 3.3*   Recent Labs  Lab 08/26/22 1501  LIPASE 121*   No results for input(s): "AMMONIA" in the last 168 hours. Coagulation Profile: No results for input(s): "INR", "PROTIME" in the last 168 hours. Cardiac Enzymes: No results for input(s): "CKTOTAL", "CKMB", "CKMBINDEX", "TROPONINI" in the last 168 hours. BNP (last 3 results) No results for input(s): "PROBNP" in the last 8760 hours. HbA1C: No results for input(s): "HGBA1C" in the last 72 hours. CBG: No results for input(s): "GLUCAP" in the last 168 hours. Lipid Profile: No results for input(s): "CHOL", "HDL", "LDLCALC", "TRIG", "CHOLHDL", "LDLDIRECT" in the last 72  hours. Thyroid Function Tests: No results for input(s): "TSH", "T4TOTAL", "FREET4", "T3FREE", "THYROIDAB" in the last 72 hours. Anemia Panel: No results for input(s): "VITAMINB12", "FOLATE", "FERRITIN", "TIBC", "IRON", "RETICCTPCT" in the last 72 hours. Urine analysis:    Component Value Date/Time   COLORURINE YELLOW (A) 05/16/2022 2017   APPEARANCEUR CLEAR (A) 05/16/2022 2017   LABSPEC 1.012 05/16/2022 2017   PHURINE 7.0 05/16/2022 2017   GLUCOSEU NEGATIVE 05/16/2022 2017   HGBUR NEGATIVE 05/16/2022 2017   BILIRUBINUR NEGATIVE 05/16/2022 2017   KETONESUR NEGATIVE 05/16/2022 2017   PROTEINUR NEGATIVE 05/16/2022 2017   NITRITE NEGATIVE 05/16/2022 2017   LEUKOCYTESUR NEGATIVE 05/16/2022 2017    Radiological Exams on Admission: CT Angio Chest PE W/Cm &/Or Wo Cm  Result Date: 08/26/2022 CLINICAL DATA:  Short of breath. Concern for pulmonary embolism. Short of breath. Chest pain. Post upper endoscopy. EXAM: CT ANGIOGRAPHY CHEST CT ABDOMEN AND PELVIS WITH CONTRAST TECHNIQUE: Multidetector CT imaging of the chest was performed using the standard protocol during bolus administration of intravenous contrast. Multiplanar CT image reconstructions and MIPs were obtained to evaluate the vascular anatomy. Multidetector CT imaging of the abdomen and pelvis was performed using the standard protocol during bolus administration of intravenous contrast. RADIATION DOSE REDUCTION: This exam was performed according to the departmental dose-optimization program which includes automated exposure control, adjustment of the mA and/or kV according to patient size and/or use of iterative reconstruction technique. CONTRAST:  70m OMNIPAQUE IOHEXOL 350 MG/ML SOLN COMPARISON:  CT 06/17/2022 FINDINGS: CTA CHEST FINDINGS Cardiovascular: Tiny nonocclusive thin filling defect within segmental branch of the RIGHT lobe pulmonary artery tree (image 253/series 4) is consistent with a small pulmonary embolism. No additional filling  defects within the LEFT or RIGHT pulmonary arteries. Overall clot burden is very low unlikely symptomatic. Mediastinum/Nodes: No axillary or supraclavicular adenopathy. No mediastinal or hilar adenopathy. No pericardial fluid. Esophagus normal. Lungs/Pleura: No pulmonary infarction. No pneumonia. No pleural fluid. No pneumothorax Mild bibasilar atelectasis. Centrilobular emphysema the upper lobes. Musculoskeletal: No aggressive osseous lesion Review of the MIP images confirms the above findings. CT ABDOMEN and PELVIS FINDINGS Lower chest: Lung bases are clear. Hepatobiliary: Multiple small enhancing nodules throughout the LEFT and RIGHT hepatic lobe. There are no similar nodules on comparison contrast MRI (07/17/2022). Small amount pericholecystic fluid. Gallbladder nondistended. Common bile duct measures  8 mm similar to comparison MRI. Pancreas: Pancreatic duct is prominent but not changed from comparison MRI. No pancreatic atrophy. Spleen: Normal spleen Adrenals/urinary tract: Adrenal glands and kidneys are normal. The ureters and bladder normal. Stomach/Bowel: Stomach, small bowel, appendix, and cecum are normal. The colon and rectosigmoid colon are normal. Vascular/Lymphatic: Abdominal aorta is normal caliber with atherosclerotic calcification. There is no retroperitoneal or periportal lymphadenopathy. No pelvic lymphadenopathy. Reproductive: Prostate unremarkable Other: No free fluid. Musculoskeletal: No aggressive osseous lesion. Review of the MIP images confirms the above findings. IMPRESSION: CHEST IMPRESSION: 1. Small nonocclusive pulmonary embolism within a segmental branch of the RIGHT lower lobe pulmonary artery tree. Overall clot burden minimal/trace. 2. No pulmonary infarction or pneumonia. PELVIS IMPRESSION: 1. multiple small enhancing nodules throughout the LEFT and RIGHT hepatic lobe. No corresponding findings on recent abdominal MRI (07/17/2022). Consider hepatic ultrasound to exclude new  nodularity. 1. Small amount pericholecystic fluid without gallbladder distension. Mild biliary duct dilatation. Recommend correlation with bilirubin evaluate for cholecystitis. 2. Stable dilatation of the pancreatic duct. 3. Aortic Atherosclerosis (ICD10-I70.0) and Emphysema (ICD10-J43.9). Findings conveyed toCHARLES JESSUP on 08/26/2022  at16:48. Electronically Signed   By: Suzy Bouchard M.D.   On: 08/26/2022 16:48   CT Abdomen Pelvis W Contrast  Result Date: 08/26/2022 CLINICAL DATA:  Short of breath. Concern for pulmonary embolism. Short of breath. Chest pain. Post upper endoscopy. EXAM: CT ANGIOGRAPHY CHEST CT ABDOMEN AND PELVIS WITH CONTRAST TECHNIQUE: Multidetector CT imaging of the chest was performed using the standard protocol during bolus administration of intravenous contrast. Multiplanar CT image reconstructions and MIPs were obtained to evaluate the vascular anatomy. Multidetector CT imaging of the abdomen and pelvis was performed using the standard protocol during bolus administration of intravenous contrast. RADIATION DOSE REDUCTION: This exam was performed according to the departmental dose-optimization program which includes automated exposure control, adjustment of the mA and/or kV according to patient size and/or use of iterative reconstruction technique. CONTRAST:  64m OMNIPAQUE IOHEXOL 350 MG/ML SOLN COMPARISON:  CT 06/17/2022 FINDINGS: CTA CHEST FINDINGS Cardiovascular: Tiny nonocclusive thin filling defect within segmental branch of the RIGHT lobe pulmonary artery tree (image 253/series 4) is consistent with a small pulmonary embolism. No additional filling defects within the LEFT or RIGHT pulmonary arteries. Overall clot burden is very low unlikely symptomatic. Mediastinum/Nodes: No axillary or supraclavicular adenopathy. No mediastinal or hilar adenopathy. No pericardial fluid. Esophagus normal. Lungs/Pleura: No pulmonary infarction. No pneumonia. No pleural fluid. No pneumothorax Mild  bibasilar atelectasis. Centrilobular emphysema the upper lobes. Musculoskeletal: No aggressive osseous lesion Review of the MIP images confirms the above findings. CT ABDOMEN and PELVIS FINDINGS Lower chest: Lung bases are clear. Hepatobiliary: Multiple small enhancing nodules throughout the LEFT and RIGHT hepatic lobe. There are no similar nodules on comparison contrast MRI (07/17/2022). Small amount pericholecystic fluid. Gallbladder nondistended. Common bile duct measures 8 mm similar to comparison MRI. Pancreas: Pancreatic duct is prominent but not changed from comparison MRI. No pancreatic atrophy. Spleen: Normal spleen Adrenals/urinary tract: Adrenal glands and kidneys are normal. The ureters and bladder normal. Stomach/Bowel: Stomach, small bowel, appendix, and cecum are normal. The colon and rectosigmoid colon are normal. Vascular/Lymphatic: Abdominal aorta is normal caliber with atherosclerotic calcification. There is no retroperitoneal or periportal lymphadenopathy. No pelvic lymphadenopathy. Reproductive: Prostate unremarkable Other: No free fluid. Musculoskeletal: No aggressive osseous lesion. Review of the MIP images confirms the above findings. IMPRESSION: CHEST IMPRESSION: 1. Small nonocclusive pulmonary embolism within a segmental branch of the RIGHT lower lobe pulmonary artery tree. Overall clot  burden minimal/trace. 2. No pulmonary infarction or pneumonia. PELVIS IMPRESSION: 1. multiple small enhancing nodules throughout the LEFT and RIGHT hepatic lobe. No corresponding findings on recent abdominal MRI (07/17/2022). Consider hepatic ultrasound to exclude new nodularity. 1. Small amount pericholecystic fluid without gallbladder distension. Mild biliary duct dilatation. Recommend correlation with bilirubin evaluate for cholecystitis. 2. Stable dilatation of the pancreatic duct. 3. Aortic Atherosclerosis (ICD10-I70.0) and Emphysema (ICD10-J43.9). Findings conveyed toCHARLES JESSUP on 08/26/2022   at16:48. Electronically Signed   By: Suzy Bouchard M.D.   On: 08/26/2022 16:48   DG Chest Port 1 View  Result Date: 08/26/2022 CLINICAL DATA:  Shortness of breath EXAM: PORTABLE CHEST 1 VIEW COMPARISON:  05/16/2022, CT chest 06/17/2022 FINDINGS: No acute airspace disease or effusion. Mild emphysema. Stable cardiomediastinal silhouette with aortic atherosclerosis. No pneumothorax. IMPRESSION: No active disease. Emphysema. Electronically Signed   By: Donavan Foil M.D.   On: 08/26/2022 15:25   DG C-Arm 1-60 Min-No Report  Result Date: 08/25/2022 Fluoroscopy was utilized by the requesting physician.  No radiographic interpretation.    EKG: Independently reviewed.  Sinus bradycardia  Assessment/Plan Principal Problem:   Cholangitis Active Problems:   Hyperlipidemia   Hypersomnia with sleep apnea   Low back pain   Hypertension   Coronary artery disease involving native coronary artery of native heart with angina pectoris (HCC)  Post ERCP cholangitis Elevated LFTs Severe sepsis secondary to above Patient presenting 1 day after ERCP with symptoms consistent with ascending cholangitis.  Severe sepsis criteria met with hypotension, elevated lactic acid, AKI leukocytosis.  Presumed source intra-abdominal.  Patient did respond to IV fluid resuscitation. Plan: Admit inpatient Empiric piperacillin/tazobactam Follow blood cultures drawn on admission Aggressive IV fluid resuscitation Clear liquid diet Monitor vitals and fever curve Repeat CMP and CBC in a.m. Telemetry monitoring Low threshold for transfer to higher level of care  AKI Suspect prerenal azotemia in setting of acute infection.  Aggressive IV fluid resuscitation.  Recheck creatinine in a.m.  Subsegmental pulmonary embolism Patient did endorse shortness of breath prior to admission.  It is unlikely that the small pulmonary embolism is primary driver for patient's symptoms.  He is not tachycardic, does not have an oxygen  requirement.  I suspect this is an incidental finding.  That being said should be addressed. Plan: Heparin GTT initiated by EDP Telemetry monitoring  Sinus bradycardia Unclear etiology.  Patient is on beta-blocker chronically.  Suspect this may be related to underlying sepsis Plan: Hold beta-blocker and any AV nodal blocking agents Telemetry monitoring Vitals per unit protocol  Essential hypertension Hold all home blood pressure medications  Hyperlipidemia Hold home statin  CAD Resume home aspirin 81 mg daily  Obstructive sleep apnea CPAP nightly  Chronic back pain Oxycodone 10-20 mg restarted perhome dose  Glaucoma Ophthalmic gtt. Restarted  Hyponatremia Unclear chronicity.  Appears acute Isotonic fluid resuscitation as above Recheck in a.m.   DVT prophylaxis: Heparin GGT Code Status: Full Family Communication: Family at bedside Disposition Plan: Anticipate return Consults called: None Admission status: Inpatient, progressive   [name] Ace MD [facility]   If 7PM-7AM, please contact night-coverage   08/26/2022, 5:32 PM

## 2022-08-26 NOTE — Progress Notes (Signed)
CODE SEPSIS - PHARMACY COMMUNICATION  **Broad Spectrum Antibiotics should be administered within 1 hour of Sepsis diagnosis**  Time Code Sepsis Called/Page Received: 1527  Antibiotics Ordered: Cefepime + vancomycin + metronidazole  Time of 1st antibiotic administration: 1602  Additional action taken by pharmacy: N/A  [name] 08/26/2022  3:34 PM

## 2022-08-26 NOTE — Consult Note (Signed)
PHARMACY -  BRIEF ANTIBIOTIC NOTE   Pharmacy has received consult(s) for cefepime and vancomycin from an ED provider. Patient is also ordered metronidazole. The patient's profile has been reviewed for ht/wt/allergies/indication/available labs.    One time order(s) placed for  --Cefepime 2 g IV --Vancomycin 1 g IV  Further antibiotics/pharmacy consults should be ordered by admitting physician if indicated.                       Thank you, [name] 08/26/2022  3:27 PM

## 2022-08-27 ENCOUNTER — Encounter: Payer: Self-pay | Admitting: Internal Medicine

## 2022-08-27 ENCOUNTER — Other Ambulatory Visit (HOSPITAL_COMMUNITY): Payer: Self-pay

## 2022-08-27 ENCOUNTER — Inpatient Hospital Stay: Payer: Medicare Other

## 2022-08-27 DIAGNOSIS — K8309 Other cholangitis: Secondary | ICD-10-CM | POA: Diagnosis not present

## 2022-08-27 LAB — BLOOD GAS, VENOUS
Acid-base deficit: 0.9 mmol/L (ref 0.0–2.0)
Bicarbonate: 22 mmol/L (ref 20.0–28.0)
O2 Saturation: 85.6 %
Patient temperature: 37
pCO2, Ven: 31 mmHg — ABNORMAL LOW (ref 44–60)
pH, Ven: 7.46 — ABNORMAL HIGH (ref 7.25–7.43)
pO2, Ven: 49 mmHg — ABNORMAL HIGH (ref 32–45)

## 2022-08-27 LAB — COMPREHENSIVE METABOLIC PANEL
ALT: 164 U/L — ABNORMAL HIGH (ref 0–44)
ALT: 136 U/L — ABNORMAL HIGH (ref 0–44)
AST: 99 U/L — ABNORMAL HIGH (ref 15–41)
AST: 71 U/L — ABNORMAL HIGH (ref 15–41)
Albumin: 2.9 g/dL — ABNORMAL LOW (ref 3.5–5.0)
Albumin: 3 g/dL — ABNORMAL LOW (ref 3.5–5.0)
Alkaline Phosphatase: 106 U/L (ref 38–126)
Alkaline Phosphatase: 106 U/L (ref 38–126)
Anion gap: 7 (ref 5–15)
Anion gap: 8 (ref 5–15)
BUN: 22 mg/dL (ref 8–23)
BUN: 17 mg/dL (ref 8–23)
CO2: 22 mmol/L (ref 22–32)
CO2: 21 mmol/L — ABNORMAL LOW (ref 22–32)
Calcium: 8.2 mg/dL — ABNORMAL LOW (ref 8.9–10.3)
Calcium: 8.2 mg/dL — ABNORMAL LOW (ref 8.9–10.3)
Chloride: 104 mmol/L (ref 98–111)
Chloride: 99 mmol/L (ref 98–111)
Creatinine, Ser: 1.24 mg/dL (ref 0.61–1.24)
Creatinine, Ser: 0.98 mg/dL (ref 0.61–1.24)
GFR, Estimated: 58 mL/min — ABNORMAL LOW (ref >60)
GFR, Estimated: >60 mL/min (ref >60)
Glucose, Bld: 97 mg/dL (ref 70–99)
Glucose, Bld: 102 mg/dL — ABNORMAL HIGH (ref 70–99)
Potassium: 4.3 mmol/L (ref 3.5–5.1)
Potassium: 4 mmol/L (ref 3.5–5.1)
Sodium: 133 mmol/L — ABNORMAL LOW (ref 135–145)
Sodium: 128 mmol/L — ABNORMAL LOW (ref 135–145)
Total Bilirubin: 1.2 mg/dL (ref 0.3–1.2)
Total Bilirubin: 1.2 mg/dL (ref 0.3–1.2)
Total Protein: 5.8 g/dL — ABNORMAL LOW (ref 6.5–8.1)
Total Protein: 6.4 g/dL — ABNORMAL LOW (ref 6.5–8.1)

## 2022-08-27 LAB — HEPARIN LEVEL (UNFRACTIONATED)
Heparin Unfractionated: 0.43 IU/mL (ref 0.30–0.70)
Heparin Unfractionated: 0.36 IU/mL (ref 0.30–0.70)

## 2022-08-27 LAB — CBC
HCT: 38.7 % — ABNORMAL LOW (ref 39.0–52.0)
Hemoglobin: 12.9 g/dL — ABNORMAL LOW (ref 13.0–17.0)
MCH: 30.4 pg (ref 26.0–34.0)
MCHC: 33.3 g/dL (ref 30.0–36.0)
MCV: 91.3 fL (ref 80.0–100.0)
Platelets: 132 10*3/uL — ABNORMAL LOW (ref 150–400)
RBC: 4.24 MIL/uL (ref 4.22–5.81)
RDW: 13.1 % (ref 11.5–15.5)
WBC: 18.9 10*3/uL — ABNORMAL HIGH (ref 4.0–10.5)
nRBC: 0 % (ref 0.0–0.2)

## 2022-08-27 LAB — BLOOD CULTURE ID PANEL (REFLEXED) - BCID2

## 2022-08-27 LAB — AMMONIA: Ammonia: 16 umol/L (ref 9–35)

## 2022-08-27 LAB — GLUCOSE, CAPILLARY: Glucose-Capillary: 111 mg/dL — ABNORMAL HIGH (ref 70–99)

## 2022-08-27 LAB — LIPASE, BLOOD: Lipase: 53 U/L — ABNORMAL HIGH (ref 11–51)

## 2022-08-27 LAB — LACTIC ACID, PLASMA: Lactic Acid, Venous: 1.1 mmol/L (ref 0.5–1.9)

## 2022-08-27 MED ORDER — METOPROLOL SUCCINATE ER 25 MG PO TB24
25.0000 mg | ORAL_TABLET | Freq: Every day | ORAL | Status: DC
  Administered 2022-08-27 – 2022-08-28 (×2): 25 mg via ORAL
  Filled 2022-08-27 (×2): qty 1

## 2022-08-27 MED ORDER — BENZONATATE 100 MG PO CAPS
200.0000 mg | ORAL_CAPSULE | Freq: Three times a day (TID) | ORAL | Status: DC
  Administered 2022-08-27 – 2022-08-29 (×7): 200 mg via ORAL
  Filled 2022-08-27 (×7): qty 2

## 2022-08-27 MED ORDER — OXYCODONE HCL 5 MG PO TABS
10.0000 mg | ORAL_TABLET | Freq: Four times a day (QID) | ORAL | Status: DC | PRN
  Administered 2022-08-27: 10 mg via ORAL
  Administered 2022-08-27 – 2022-08-29 (×6): 20 mg via ORAL
  Filled 2022-08-27: qty 4
  Filled 2022-08-27: qty 2
  Filled 2022-08-27 (×5): qty 4

## 2022-08-27 MED ORDER — SODIUM CHLORIDE 0.9 % IV SOLN: INTRAVENOUS | Status: DC

## 2022-08-27 NOTE — Consult Note (Signed)
[place] for IV Heparin Indication: pulmonary embolus  Patient Measurements:   Heparin Dosing Weight: 69.9 kg  Labs: Recent Labs    08/26/22 1501 08/26/22 1832 08/27/22 0206 08/27/22 0832 08/27/22 0953  HGB 13.5  --   --  12.9*  --   HCT 40.2  --   --  38.7*  --   PLT 152  --   --  132*  --   APTT  --  24  --   --   --   LABPROT  --  15.2  --   --   --   INR  --  1.2  --   --   --   HEPARINUNFRC  --   --  0.43  --  0.36  CREATININE 1.74*  --   --  1.24  --   TROPONINIHS 35*  --   --   --   --      Estimated Creatinine Clearance: 45.4 mL/min (by C-G formula based on SCr of 1.24 mg/dL).   Medical History: Past Medical History:  Diagnosis Date   Adenomatous polyp    Carpal tunnel syndrome on left    Cervical spine disease    Coronary artery disease    S/p anterior MI tx with PCI in 1998  //  Myoview 6/22: EF 45, ant and septal scar; no ischemia; intermediate risk // Echocardiogram 7/22: EF 45-50, ant-sept and apical HK, Gr 1 DD, normal RVSF, mild MR   Hyperlipidemia    Hypertension    Ischemic cardiomyopathy 05/19/2022   Echocardiogram 06/02/22: EF 45-50, global HK, Gr 1 DD, NL RVSF, NL PASP (RVSP 26.4), trivial MR, RAP 3   Low back pain    Myocardial infarction ([place]) 1998   Sleep apnea    CPAP   Wears dentures    full upper    Medications:  No anticoagulation prior to admission per my chart review. Medication reconciliation is pending  Assessment: 82 y/o M presenting to the ED 2/7 for dizziness, hypotension, lightheadedness. Patient had ERCP 2/6. Incidentally found to have acute PE with low clot burden. Pharmacy consulted to initiate and manage heparin infusion for acute PE.  2/8 0206 HL 0.43 2/8 0953 HL 0.36.    Goal of Therapy:  Heparin level 0.3-0.7 units/ml Monitor platelets by anticoagulation protocol: Yes  02/08 0206 HL 0.43, therapeutic x 1   Plan:  Heparin level is therapeutic. Will continue heparin  infusion at 1150 units/hr. Recheck heparin level in 8 hours. CBC daily while on heparin gtt.    Eleonore Chiquito, PharmD, 08/27/2022 11:13 AM

## 2022-08-27 NOTE — TOC Benefit Eligibility Note (Signed)
Patient Advocate Encounter  Insurance verification completed.    The patient is currently admitted and upon discharge could be taking Eliquis 5 mg.  The current 30 day co-pay is $47.00.   The patient is insured through [employer] Medicare Part D   Aldean Suddeth, CPHT Pharmacy Patient Advocate Specialist [facility] Patient Advocate Team Direct Number: [phone]  Fax: [phone]       

## 2022-08-27 NOTE — Plan of Care (Signed)

## 2022-08-27 NOTE — Progress Notes (Signed)
Patient arrived to floor from ED.  Oriented to room and call bell, family at bedside.

## 2022-08-27 NOTE — Consult Note (Signed)
[place] for IV Heparin Indication: pulmonary embolus  Patient Measurements:   Heparin Dosing Weight: 69.9 kg  Labs: Recent Labs    08/26/22 1501 08/26/22 1832 08/27/22 0206  HGB 13.5  --   --   HCT 40.2  --   --   PLT 152  --   --   APTT  --  24  --   LABPROT  --  15.2  --   INR  --  1.2  --   HEPARINUNFRC  --   --  0.43  CREATININE 1.74*  --   --   TROPONINIHS 35*  --   --      Estimated Creatinine Clearance: 32.4 mL/min (A) (by C-G formula based on SCr of 1.74 mg/dL (H)).   Medical History: Past Medical History:  Diagnosis Date   Adenomatous polyp    Carpal tunnel syndrome on left    Cervical spine disease    Coronary artery disease    S/p anterior MI tx with PCI in 1998  //  Myoview 6/22: EF 45, ant and septal scar; no ischemia; intermediate risk // Echocardiogram 7/22: EF 45-50, ant-sept and apical HK, Gr 1 DD, normal RVSF, mild MR   Hyperlipidemia    Hypertension    Ischemic cardiomyopathy 05/19/2022   Echocardiogram 06/02/22: EF 45-50, global HK, Gr 1 DD, NL RVSF, NL PASP (RVSP 26.4), trivial MR, RAP 3   Low back pain    Myocardial infarction ([place]) 1998   Sleep apnea    CPAP   Wears dentures    full upper    Medications:  No anticoagulation prior to admission per my chart review. Medication reconciliation is pending  Assessment: 82 y/o M presenting to the ED 2/7 for dizziness, hypotension, lightheadedness. Patient had ERCP 2/6. Incidentally found to have acute PE with low clot burden. Pharmacy consulted to initiate and manage heparin infusion for acute PE.  Baseline aPTT and PT-INR are pending. Baseline H&H, platelets within normal limits.  Goal of Therapy:  Heparin level 0.3-0.7 units/ml Monitor platelets by anticoagulation protocol: Yes  02/08 0206 HL 0.43, therapeutic x 1   Plan:  --Continue heparin infusion at 1150 units/hr --Recheck HL 8 hours to confirm --Daily CBC per protocol while on IV  heparin  Renda Rolls, PharmD, [facility] 08/27/2022 3:13 AM

## 2022-08-27 NOTE — Progress Notes (Signed)
PROGRESS NOTE    Brian [name]  VZD:[id number] DOB: Sep 02, 1940 DOA: 08/26/2022 PCP: [name], MD    Brief Narrative:  82 y.o. male with medical history significant of hypertension, CAD, hyperlipidemia, glaucoma who presents for evaluation 1 day after outpatient ERCP.  During ERCP biliary asymptomatically was performed and biliary sludge was noted.  Biliary tree was swept with no stones and patient was discharged home.  Subsequently in the next 24 hours patient developed dry cough and progressive shortness of breath.  He contacted gastroenterology office who directed him to the emergency department.   Upon presentation to the emergency department patient has elevated LFTs with increased white blood cell count associate with hypotension.  Hypotension was treated with 2 L bolus isotonic fluids.  Good improvement blood pressure was noted.  CT abdomen performed did not demonstrate any signs of perforation.   Incidentally a CT angio chest was performed due to shortness of breath.  Found to have a small nonocclusive subsegmental pulmonary embolism and was started on heparin gtt.  Considering working diagnosis of ascending cholangitis post ERCP as well as incidental discovery of segmental pulmonary embolism admission request was made.  2/8: Blood culture positive for E. coli.  LFTs and white blood cell count trending down  Assessment & Plan:   Principal Problem:   Cholangitis Active Problems:   Hyperlipidemia   Hypersomnia with sleep apnea   Low back pain   Hypertension   Coronary artery disease involving native coronary artery of native heart with angina pectoris (HCC)  Post ERCP cholangitis E. coli bacteremia Elevated LFTs Severe sepsis secondary to above Patient presenting 1 day after ERCP with symptoms consistent with ascending cholangitis.  Severe sepsis criteria met with hypotension, elevated lactic acid, AKI leukocytosis.  Presumed source intra-abdominal.  Blood culture  positive for E. coli, patient intra-abdominal source patient did respond to IV fluid resuscitation. Plan: Continue Zosyn Continue with blood cultures Aggressive IV fluid resuscitation, IV fluid rate reduced to 100 cc/h Full liquid diet Monitor vitals and fever curve Repeat CMP and CBC in a.m. Telemetry monitoring If patient remained stable anticipate discharge home 2/9   AKI Suspect prerenal azotemia in setting of acute infection.  Improving over interval aggressive IV fluid resuscitation.  Recheck creatinine in a.m.   Subsegmental pulmonary embolism Patient did endorse shortness of breath prior to admission.  It is unlikely that the small pulmonary embolism is primary driver for patient's symptoms.  He is not tachycardic, does not have an oxygen requirement.  I suspect this is an incidental finding.  That being said should be addressed. Plan: Continue heparin GTT for today Anticipate transition to [place] tomorrow a.m.  Telemetry monitoring   Sinus bradycardia Unclear etiology.  Patient is on beta-blocker chronically.  Suspect this may be related to underlying sepsis.  Bradycardia improved Plan: Hold beta-blocker and any AV nodal blocking agents Telemetry monitoring Vitals per unit protocol   Essential hypertension Hold all home blood pressure medications   Hyperlipidemia Hold home statin   CAD Resume home aspirin 81 mg daily   Obstructive sleep apnea CPAP nightly   Chronic back pain Oxycodone 10-20 mg restarted per home dose   Glaucoma Ophthalmic gtt. Restarted   Hyponatremia Unclear chronicity.  Appears acute.  Improving Isotonic fluid resuscitation as above Recheck in a.m.   DVT prophylaxis: Heparin GTT Code Status: Full Family Communication: Daughter via phone 2/8 Disposition Plan: Status is: Inpatient Remains inpatient appropriate because: Sepsis, acute cholangitis, E. coli bacteremia on IV antibiotics.  Anticipate discharge on 2/9.   Level of care:  Telemetry Medical  Consultants:  None  Procedures:  None  Antimicrobials: Zosyn   Subjective: Seen and examined.  He is anxious about continued hospital stay.  Feels like he is becoming weaker when not ambulating.  Endorses acute on chronic low back pain.  Objective: Vitals:   08/26/22 2150 08/27/22 0221 08/27/22 0638 08/27/22 0900  BP:  102/62 (!) 115/59 (!) 127/90  Pulse: 73 87 89 73  Resp: (!) '23 18 16 17  '$ Temp:  98.5 F (36.9 C) 98.3 F (36.8 C)   TempSrc:  Oral Oral   SpO2:  95% 96% 96%    Intake/Output Summary (Last 24 hours) at 08/27/2022 1333 Last data filed at 08/27/2022 0900 Gross per 24 hour  Intake 1202.26 ml  Output 550 ml  Net 652.26 ml   There were no vitals filed for this visit.  Examination:  General exam: Appears calm and comfortable  Respiratory system: Clear to auscultation. Respiratory effort normal. Cardiovascular system: S1-S2, RRR, no murmurs, no pedal edema Gastrointestinal system: Soft, nondistended, tender to palpation epigastrium, normal bowel sounds Central nervous system: Alert and oriented. No focal neurological deficits. Extremities: Symmetric 5 x 5 power. Skin: No rashes, lesions or ulcers Psychiatry: Judgement and insight appear normal. Mood & affect appropriate.     Data Reviewed: I have personally reviewed following labs and imaging studies  CBC: Recent Labs  Lab 08/26/22 1501 08/27/22 0832  WBC 25.0* 18.9*  NEUTROABS 21.7*  --   HGB 13.5 12.9*  HCT 40.2 38.7*  MCV 90.3 91.3  PLT 152 272*   Basic Metabolic Panel: Recent Labs  Lab 08/26/22 1501 08/27/22 0832  NA 127* 133*  K 4.8 4.3  CL 96* 104  CO2 22 22  GLUCOSE 112* 97  BUN 29* 22  CREATININE 1.74* 1.24  CALCIUM 8.5* 8.2*  MG 1.9  --    GFR: Estimated Creatinine Clearance: 45.4 mL/min (by C-G formula based on SCr of 1.24 mg/dL). Liver Function Tests: Recent Labs  Lab 08/26/22 1501 08/27/22 0832  AST 211* 99*  ALT 267* 164*  ALKPHOS 117 106   BILITOT 2.5* 1.2  PROT 6.6 5.8*  ALBUMIN 3.3* 2.9*   Recent Labs  Lab 08/26/22 1501 08/27/22 0832  LIPASE 121* 53*   No results for input(s): "AMMONIA" in the last 168 hours. Coagulation Profile: Recent Labs  Lab 08/26/22 1832  INR 1.2   Cardiac Enzymes: No results for input(s): "CKTOTAL", "CKMB", "CKMBINDEX", "TROPONINI" in the last 168 hours. BNP (last 3 results) No results for input(s): "PROBNP" in the last 8760 hours. HbA1C: No results for input(s): "HGBA1C" in the last 72 hours. CBG: No results for input(s): "GLUCAP" in the last 168 hours. Lipid Profile: No results for input(s): "CHOL", "HDL", "LDLCALC", "TRIG", "CHOLHDL", "LDLDIRECT" in the last 72 hours. Thyroid Function Tests: No results for input(s): "TSH", "T4TOTAL", "FREET4", "T3FREE", "THYROIDAB" in the last 72 hours. Anemia Panel: No results for input(s): "VITAMINB12", "FOLATE", "FERRITIN", "TIBC", "IRON", "RETICCTPCT" in the last 72 hours. Sepsis Labs: Recent Labs  Lab 08/26/22 1508 08/26/22 1832 08/27/22 0837  LATICACIDVEN 2.2* 2.2* 1.1    Recent Results (from the past 240 hour(s))  Culture, blood (routine x 2)     Status: None (Preliminary result)   Collection Time: 08/26/22  3:08 PM   Specimen: BLOOD  Result Value Ref Range Status   Specimen Description BLOOD BLOOD LEFT ARM  Final   Special Requests BOTTLES DRAWN AEROBIC AND ANAEROBIC  BCAV  Final   Culture  Setup Time   Final    GRAM NEGATIVE RODS CRITICAL VALUE NOTED.  VALUE IS CONSISTENT WITH PREVIOUSLY REPORTED AND CALLED VALUE. Performed at [facility], [place] [place], [place] [address]    Culture GRAM NEGATIVE RODS  Final   Report Status PENDING  Incomplete  Culture, blood (routine x 2)     Status: None (Preliminary result)   Collection Time: 08/26/22  4:09 PM   Specimen: BLOOD  Result Value Ref Range Status   Specimen Description BLOOD LEFT ANTECUBITAL  Final   Special Requests   Final    BOTTLES DRAWN AEROBIC  AND ANAEROBIC Blood Culture adequate volume   Culture  Setup Time   Final    GRAM NEGATIVE RODS AEROBIC BOTTLE ONLY Organism ID to follow CRITICAL RESULT CALLED TO, READ BACK BY AND VERIFIED WITHPamelia Hoit PHARMD [id number] 08/27/22 HNM Performed at [facility], [address] [place], [place] [address]    Culture GRAM NEGATIVE RODS  Final   Report Status PENDING  Incomplete  Blood Culture ID Panel (Reflexed)     Status: Abnormal   Collection Time: 08/26/22  4:09 PM  Result Value Ref Range Status   Enterococcus faecalis NOT DETECTED NOT DETECTED Final   Enterococcus Faecium NOT DETECTED NOT DETECTED Final   Listeria monocytogenes NOT DETECTED NOT DETECTED Final   Staphylococcus species NOT DETECTED NOT DETECTED Final   Staphylococcus aureus (BCID) NOT DETECTED NOT DETECTED Final   Staphylococcus epidermidis NOT DETECTED NOT DETECTED Final   Staphylococcus lugdunensis NOT DETECTED NOT DETECTED Final   Streptococcus species NOT DETECTED NOT DETECTED Final   Streptococcus agalactiae NOT DETECTED NOT DETECTED Final   Streptococcus pneumoniae NOT DETECTED NOT DETECTED Final   Streptococcus pyogenes NOT DETECTED NOT DETECTED Final   A.calcoaceticus-baumannii NOT DETECTED NOT DETECTED Final   Bacteroides fragilis NOT DETECTED NOT DETECTED Final   Enterobacterales DETECTED (A) NOT DETECTED Final    Comment: Enterobacterales represent a large order of gram negative bacteria, not a single organism. CRITICAL RESULT CALLED TO, READ BACK BY AND VERIFIED WITH: MORGAN GOBBLE PHARMD 1015 08/27/22 HNM    Enterobacter cloacae complex NOT DETECTED NOT DETECTED Final   Escherichia coli DETECTED (A) NOT DETECTED Final    Comment: CRITICAL RESULT CALLED TO, READ BACK BY AND VERIFIED WITH: MORGAN GOBBLE PHARMD 1015 08/27/22 HNM    Klebsiella aerogenes NOT DETECTED NOT DETECTED Final   Klebsiella oxytoca NOT DETECTED NOT DETECTED Final   Klebsiella pneumoniae NOT DETECTED NOT DETECTED Final    Proteus species NOT DETECTED NOT DETECTED Final   Salmonella species NOT DETECTED NOT DETECTED Final   Serratia marcescens NOT DETECTED NOT DETECTED Final   Haemophilus influenzae NOT DETECTED NOT DETECTED Final   Neisseria meningitidis NOT DETECTED NOT DETECTED Final   Pseudomonas aeruginosa NOT DETECTED NOT DETECTED Final   Stenotrophomonas maltophilia NOT DETECTED NOT DETECTED Final   Candida albicans NOT DETECTED NOT DETECTED Final   Candida auris NOT DETECTED NOT DETECTED Final   Candida glabrata NOT DETECTED NOT DETECTED Final   Candida krusei NOT DETECTED NOT DETECTED Final   Candida parapsilosis NOT DETECTED NOT DETECTED Final   Candida tropicalis NOT DETECTED NOT DETECTED Final   Cryptococcus neoformans/gattii NOT DETECTED NOT DETECTED Final   CTX-M ESBL NOT DETECTED NOT DETECTED Final   Carbapenem resistance IMP NOT DETECTED NOT DETECTED Final   Carbapenem resistance KPC NOT DETECTED NOT DETECTED Final   Carbapenem resistance NDM NOT  DETECTED NOT DETECTED Final   Carbapenem resist OXA 48 LIKE NOT DETECTED NOT DETECTED Final   Carbapenem resistance VIM NOT DETECTED NOT DETECTED Final    Comment: Performed at [facility], [address] [place], [place] [address]         Radiology Studies: US ABDOMEN LIMITED RUQ (LIVER/GB)  Result Date: 08/26/2022 CLINICAL DATA:  Liver disease, abnormal CT, history of cholelithiasis EXAM: ULTRASOUND ABDOMEN LIMITED RIGHT UPPER QUADRANT COMPARISON:  07/17/2022, 08/26/2022, 06/17/2022 FINDINGS: Gallbladder: Gallbladder is moderately distended with only minimal dependent sludge identified. No shadowing gallstones. No gallbladder wall thickening. Trace pericholecystic fluid is unchanged since earlier CT. Negative sonographic Murphy sign. Common bile duct: Diameter: 8 mm Liver: Liver demonstrates normal echotexture without focal parenchymal abnormality. Specifically, there are no sonographic findings to correspond to the diffuse nodularity  seen on the early relatively arterial phase of the preceding CT. No intrahepatic biliary duct dilation. Portal vein is patent on color Doppler imaging with normal direction of blood flow towards the liver. Other: None. IMPRESSION: 1. Minimal gallbladder sludge. No evidence of cholelithiasis or cholecystitis. 2. Grossly normal sonographic appearance of the liver. Specifically, no findings to correspond to the enhancing nodularity seen on preceding CT. 3. Trace pericholecystic fluid unchanged. Electronically Signed   By: Randa Ngo M.D.   On: 08/26/2022 17:58   CT Angio Chest PE W/Cm &/Or Wo Cm  Result Date: 08/26/2022 CLINICAL DATA:  Short of breath. Concern for pulmonary embolism. Short of breath. Chest pain. Post upper endoscopy. EXAM: CT ANGIOGRAPHY CHEST CT ABDOMEN AND PELVIS WITH CONTRAST TECHNIQUE: Multidetector CT imaging of the chest was performed using the standard protocol during bolus administration of intravenous contrast. Multiplanar CT image reconstructions and MIPs were obtained to evaluate the vascular anatomy. Multidetector CT imaging of the abdomen and pelvis was performed using the standard protocol during bolus administration of intravenous contrast. RADIATION DOSE REDUCTION: This exam was performed according to the departmental dose-optimization program which includes automated exposure control, adjustment of the mA and/or kV according to patient size and/or use of iterative reconstruction technique. CONTRAST:  19m OMNIPAQUE IOHEXOL 350 MG/ML SOLN COMPARISON:  CT 06/17/2022 FINDINGS: CTA CHEST FINDINGS Cardiovascular: Tiny nonocclusive thin filling defect within segmental branch of the RIGHT lobe pulmonary artery tree (image 253/series 4) is consistent with a small pulmonary embolism. No additional filling defects within the LEFT or RIGHT pulmonary arteries. Overall clot burden is very low unlikely symptomatic. Mediastinum/Nodes: No axillary or supraclavicular adenopathy. No mediastinal  or hilar adenopathy. No pericardial fluid. Esophagus normal. Lungs/Pleura: No pulmonary infarction. No pneumonia. No pleural fluid. No pneumothorax Mild bibasilar atelectasis. Centrilobular emphysema the upper lobes. Musculoskeletal: No aggressive osseous lesion Review of the MIP images confirms the above findings. CT ABDOMEN and PELVIS FINDINGS Lower chest: Lung bases are clear. Hepatobiliary: Multiple small enhancing nodules throughout the LEFT and RIGHT hepatic lobe. There are no similar nodules on comparison contrast MRI (07/17/2022). Small amount pericholecystic fluid. Gallbladder nondistended. Common bile duct measures 8 mm similar to comparison MRI. Pancreas: Pancreatic duct is prominent but not changed from comparison MRI. No pancreatic atrophy. Spleen: Normal spleen Adrenals/urinary tract: Adrenal glands and kidneys are normal. The ureters and bladder normal. Stomach/Bowel: Stomach, small bowel, appendix, and cecum are normal. The colon and rectosigmoid colon are normal. Vascular/Lymphatic: Abdominal aorta is normal caliber with atherosclerotic calcification. There is no retroperitoneal or periportal lymphadenopathy. No pelvic lymphadenopathy. Reproductive: Prostate unremarkable Other: No free fluid. Musculoskeletal: No aggressive osseous lesion. Review of the MIP images confirms the  above findings. IMPRESSION: CHEST IMPRESSION: 1. Small nonocclusive pulmonary embolism within a segmental branch of the RIGHT lower lobe pulmonary artery tree. Overall clot burden minimal/trace. 2. No pulmonary infarction or pneumonia. PELVIS IMPRESSION: 1. multiple small enhancing nodules throughout the LEFT and RIGHT hepatic lobe. No corresponding findings on recent abdominal MRI (07/17/2022). Consider hepatic ultrasound to exclude new nodularity. 1. Small amount pericholecystic fluid without gallbladder distension. Mild biliary duct dilatation. Recommend correlation with bilirubin evaluate for cholecystitis. 2. Stable  dilatation of the pancreatic duct. 3. Aortic Atherosclerosis (ICD10-I70.0) and Emphysema (ICD10-J43.9). Findings conveyed toCHARLES JESSUP on 08/26/2022  at16:48. Electronically Signed   By: Suzy Bouchard M.D.   On: 08/26/2022 16:48   CT Abdomen Pelvis W Contrast  Result Date: 08/26/2022 CLINICAL DATA:  Short of breath. Concern for pulmonary embolism. Short of breath. Chest pain. Post upper endoscopy. EXAM: CT ANGIOGRAPHY CHEST CT ABDOMEN AND PELVIS WITH CONTRAST TECHNIQUE: Multidetector CT imaging of the chest was performed using the standard protocol during bolus administration of intravenous contrast. Multiplanar CT image reconstructions and MIPs were obtained to evaluate the vascular anatomy. Multidetector CT imaging of the abdomen and pelvis was performed using the standard protocol during bolus administration of intravenous contrast. RADIATION DOSE REDUCTION: This exam was performed according to the departmental dose-optimization program which includes automated exposure control, adjustment of the mA and/or kV according to patient size and/or use of iterative reconstruction technique. CONTRAST:  30m OMNIPAQUE IOHEXOL 350 MG/ML SOLN COMPARISON:  CT 06/17/2022 FINDINGS: CTA CHEST FINDINGS Cardiovascular: Tiny nonocclusive thin filling defect within segmental branch of the RIGHT lobe pulmonary artery tree (image 253/series 4) is consistent with a small pulmonary embolism. No additional filling defects within the LEFT or RIGHT pulmonary arteries. Overall clot burden is very low unlikely symptomatic. Mediastinum/Nodes: No axillary or supraclavicular adenopathy. No mediastinal or hilar adenopathy. No pericardial fluid. Esophagus normal. Lungs/Pleura: No pulmonary infarction. No pneumonia. No pleural fluid. No pneumothorax Mild bibasilar atelectasis. Centrilobular emphysema the upper lobes. Musculoskeletal: No aggressive osseous lesion Review of the MIP images confirms the above findings. CT ABDOMEN and PELVIS  FINDINGS Lower chest: Lung bases are clear. Hepatobiliary: Multiple small enhancing nodules throughout the LEFT and RIGHT hepatic lobe. There are no similar nodules on comparison contrast MRI (07/17/2022). Small amount pericholecystic fluid. Gallbladder nondistended. Common bile duct measures 8 mm similar to comparison MRI. Pancreas: Pancreatic duct is prominent but not changed from comparison MRI. No pancreatic atrophy. Spleen: Normal spleen Adrenals/urinary tract: Adrenal glands and kidneys are normal. The ureters and bladder normal. Stomach/Bowel: Stomach, small bowel, appendix, and cecum are normal. The colon and rectosigmoid colon are normal. Vascular/Lymphatic: Abdominal aorta is normal caliber with atherosclerotic calcification. There is no retroperitoneal or periportal lymphadenopathy. No pelvic lymphadenopathy. Reproductive: Prostate unremarkable Other: No free fluid. Musculoskeletal: No aggressive osseous lesion. Review of the MIP images confirms the above findings. IMPRESSION: CHEST IMPRESSION: 1. Small nonocclusive pulmonary embolism within a segmental branch of the RIGHT lower lobe pulmonary artery tree. Overall clot burden minimal/trace. 2. No pulmonary infarction or pneumonia. PELVIS IMPRESSION: 1. multiple small enhancing nodules throughout the LEFT and RIGHT hepatic lobe. No corresponding findings on recent abdominal MRI (07/17/2022). Consider hepatic ultrasound to exclude new nodularity. 1. Small amount pericholecystic fluid without gallbladder distension. Mild biliary duct dilatation. Recommend correlation with bilirubin evaluate for cholecystitis. 2. Stable dilatation of the pancreatic duct. 3. Aortic Atherosclerosis (ICD10-I70.0) and Emphysema (ICD10-J43.9). Findings conveyed toCHARLES JESSUP on 08/26/2022  at16:48. Electronically Signed   By: SSuzy BouchardM.D.   On:  08/26/2022 16:48   DG Chest Port 1 View  Result Date: 08/26/2022 CLINICAL DATA:  Shortness of breath EXAM: PORTABLE CHEST 1  VIEW COMPARISON:  05/16/2022, CT chest 06/17/2022 FINDINGS: No acute airspace disease or effusion. Mild emphysema. Stable cardiomediastinal silhouette with aortic atherosclerosis. No pneumothorax. IMPRESSION: No active disease. Emphysema. Electronically Signed   By: Donavan Foil M.D.   On: 08/26/2022 15:25        Scheduled Meds:  benzonatate  200 mg Oral TID   latanoprost  1 drop Right Eye QHS   Continuous Infusions:  heparin 1,150 Units/hr (08/27/22 0438)   lactated ringers 100 mL/hr at 08/27/22 0809   piperacillin-tazobactam (ZOSYN)  IV Stopped (08/27/22 0981)     LOS: 1 day      [name] Ace, MD [facility]   If 7PM-7AM, please contact night-coverage  08/27/2022, 1:33 PM

## 2022-08-27 NOTE — Progress Notes (Signed)
PHARMACY - PHYSICIAN COMMUNICATION CRITICAL VALUE ALERT - BLOOD CULTURE IDENTIFICATION (BCID)  Brian [name] is an 82 y.o. male who presented to [facility] on 08/26/2022 with a chief complaint of dizzy, low BP.    Assessment:  Blood cultures from 2/7 with GNR, BCID detects E coli.  He is s/p outpatient ERCP 2/6 - sludge swept with sphincterotomy. Concer for cholangitis w/ increasing LFTs.  No evidence of post-ERCP pancreatitis.    Name of physician (or Provider) Contacted: Dr Priscella Mann  Current antibiotics: piperacillin/tazobactam  Changes to prescribed antibiotics recommended:  Patient is on recommended antibiotics - No changes needed  Results for orders placed or performed during the hospital encounter of 08/26/22  Blood Culture ID Panel (Reflexed) (Collected: 08/26/2022  4:09 PM)  Result Value Ref Range   Enterococcus faecalis NOT DETECTED NOT DETECTED   Enterococcus Faecium NOT DETECTED NOT DETECTED   Listeria monocytogenes NOT DETECTED NOT DETECTED   Staphylococcus species NOT DETECTED NOT DETECTED   Staphylococcus aureus (BCID) NOT DETECTED NOT DETECTED   Staphylococcus epidermidis NOT DETECTED NOT DETECTED   Staphylococcus lugdunensis NOT DETECTED NOT DETECTED   Streptococcus species NOT DETECTED NOT DETECTED   Streptococcus agalactiae NOT DETECTED NOT DETECTED   Streptococcus pneumoniae NOT DETECTED NOT DETECTED   Streptococcus pyogenes NOT DETECTED NOT DETECTED   A.calcoaceticus-baumannii NOT DETECTED NOT DETECTED   Bacteroides fragilis NOT DETECTED NOT DETECTED   Enterobacterales DETECTED (A) NOT DETECTED   Enterobacter cloacae complex NOT DETECTED NOT DETECTED   Escherichia coli DETECTED (A) NOT DETECTED   Klebsiella aerogenes NOT DETECTED NOT DETECTED   Klebsiella oxytoca NOT DETECTED NOT DETECTED   Klebsiella pneumoniae NOT DETECTED NOT DETECTED   Proteus species NOT DETECTED NOT DETECTED   Salmonella species NOT DETECTED NOT DETECTED   Serratia marcescens NOT  DETECTED NOT DETECTED   Haemophilus influenzae NOT DETECTED NOT DETECTED   Neisseria meningitidis NOT DETECTED NOT DETECTED   Pseudomonas aeruginosa NOT DETECTED NOT DETECTED   Stenotrophomonas maltophilia NOT DETECTED NOT DETECTED   Candida albicans NOT DETECTED NOT DETECTED   Candida auris NOT DETECTED NOT DETECTED   Candida glabrata NOT DETECTED NOT DETECTED   Candida krusei NOT DETECTED NOT DETECTED   Candida parapsilosis NOT DETECTED NOT DETECTED   Candida tropicalis NOT DETECTED NOT DETECTED   Cryptococcus neoformans/gattii NOT DETECTED NOT DETECTED   CTX-M ESBL NOT DETECTED NOT DETECTED   Carbapenem resistance IMP NOT DETECTED NOT DETECTED   Carbapenem resistance KPC NOT DETECTED NOT DETECTED   Carbapenem resistance NDM NOT DETECTED NOT DETECTED   Carbapenem resist OXA 48 LIKE NOT DETECTED NOT DETECTED   Carbapenem resistance VIM NOT DETECTED NOT DETECTED    Doreene Eland, PharmD, BCPS, BCIDP Work Cell: [phone] 08/27/2022 10:45 AM

## 2022-08-27 NOTE — ED Notes (Signed)
Patient ambulates around unit with assistance of Akia, EDT. Patient denies any complications.

## 2022-08-27 NOTE — Progress Notes (Signed)
Patient was assessed by RT for CPAP use this evening. Patient stated that he was willing to wear it but would like to hold off until the pain in his chest and abdomen subside. Patient advised to notify his nurse when he begins to feel better pain management, currently stable on room air.

## 2022-08-28 DIAGNOSIS — K8309 Other cholangitis: Secondary | ICD-10-CM | POA: Diagnosis not present

## 2022-08-28 LAB — CBC
HCT: 35.1 % — ABNORMAL LOW (ref 39.0–52.0)
Hemoglobin: 11.9 g/dL — ABNORMAL LOW (ref 13.0–17.0)
MCH: 30.2 pg (ref 26.0–34.0)
MCHC: 33.9 g/dL (ref 30.0–36.0)
MCV: 89.1 fL (ref 80.0–100.0)
Platelets: 103 10*3/uL — ABNORMAL LOW (ref 150–400)
RBC: 3.94 MIL/uL — ABNORMAL LOW (ref 4.22–5.81)
RDW: 12.9 % (ref 11.5–15.5)
WBC: 8.9 10*3/uL (ref 4.0–10.5)
nRBC: 0 % (ref 0.0–0.2)

## 2022-08-28 LAB — COMPREHENSIVE METABOLIC PANEL
ALT: 109 U/L — ABNORMAL HIGH (ref 0–44)
AST: 49 U/L — ABNORMAL HIGH (ref 15–41)
Albumin: 2.8 g/dL — ABNORMAL LOW (ref 3.5–5.0)
Alkaline Phosphatase: 96 U/L (ref 38–126)
Anion gap: 7 (ref 5–15)
BUN: 16 mg/dL (ref 8–23)
CO2: 20 mmol/L — ABNORMAL LOW (ref 22–32)
Calcium: 7.7 mg/dL — ABNORMAL LOW (ref 8.9–10.3)
Chloride: 103 mmol/L (ref 98–111)
Creatinine, Ser: 1.01 mg/dL (ref 0.61–1.24)
GFR, Estimated: >60 mL/min (ref >60)
Glucose, Bld: 90 mg/dL (ref 70–99)
Potassium: 3.9 mmol/L (ref 3.5–5.1)
Sodium: 130 mmol/L — ABNORMAL LOW (ref 135–145)
Total Bilirubin: 1.1 mg/dL (ref 0.3–1.2)
Total Protein: 5.9 g/dL — ABNORMAL LOW (ref 6.5–8.1)

## 2022-08-28 LAB — HEPARIN LEVEL (UNFRACTIONATED)
Heparin Unfractionated: 0.11 IU/mL — ABNORMAL LOW (ref 0.30–0.70)
Heparin Unfractionated: 0.2 IU/mL — ABNORMAL LOW (ref 0.30–0.70)

## 2022-08-28 LAB — GLUCOSE, CAPILLARY: Glucose-Capillary: 97 mg/dL (ref 70–99)

## 2022-08-28 MED ORDER — HEPARIN BOLUS VIA INFUSION
1050.0000 [IU] | Freq: Once | INTRAVENOUS | Status: AC
  Administered 2022-08-28: 1050 [IU] via INTRAVENOUS
  Filled 2022-08-28: qty 1050

## 2022-08-28 MED ORDER — HEPARIN BOLUS VIA INFUSION
2000.0000 [IU] | Freq: Once | INTRAVENOUS | Status: AC
  Administered 2022-08-28: 2000 [IU] via INTRAVENOUS
  Filled 2022-08-28: qty 2000

## 2022-08-28 NOTE — Progress Notes (Signed)
PROGRESS NOTE    Brian [name]  [id number] DOB: 1940-08-01 DOA: 08/26/2022 PCP: [name], MD    Brief Narrative:  82 y.o. male with medical history significant of hypertension, CAD, hyperlipidemia, glaucoma who presents for evaluation 1 day after outpatient ERCP.  During ERCP biliary asymptomatically was performed and biliary sludge was noted.  Biliary tree was swept with no stones and patient was discharged home.  Subsequently in the next 24 hours patient developed dry cough and progressive shortness of breath.  He contacted gastroenterology office who directed him to the emergency department.   Upon presentation to the emergency department patient has elevated LFTs with increased white blood cell count associate with hypotension.  Hypotension was treated with 2 L bolus isotonic fluids.  Good improvement blood pressure was noted.  CT abdomen performed did not demonstrate any signs of perforation.   Incidentally a CT angio chest was performed due to shortness of breath.  Found to have a small nonocclusive subsegmental pulmonary embolism and was started on heparin gtt.  Considering working diagnosis of ascending cholangitis post ERCP as well as incidental discovery of segmental pulmonary embolism admission request was made.  2/8: Blood culture positive for E. coli.  LFTs and white blood cell count trending down  Assessment & Plan:   Principal Problem:   Cholangitis Active Problems:   Hyperlipidemia   Hypersomnia with sleep apnea   Low back pain   Hypertension   Coronary artery disease involving native coronary artery of native heart with angina pectoris (HCC)  Post ERCP cholangitis E. coli bacteremia Elevated LFTs Severe sepsis secondary to above Patient presenting 1 day after ERCP with symptoms consistent with ascending cholangitis.  Severe sepsis criteria met with hypotension, elevated lactic acid, AKI leukocytosis.  Presumed source intra-abdominal.  Blood culture  positive for E. coli, patient intra-abdominal source patient did respond to IV fluid resuscitation. Plan: Continue Zosyn Follow blood cultures for sensitivities DC IVF Soft diet Monitor vitals and fever curve Repeat CMP and CBC in a.m. Telemetry monitoring If patient remained stable anticipate discharge home 2/10   AKI Suspect prerenal azotemia in setting of acute infection.   Improving over interval aggressive IV fluid resuscitation.   Recheck creatinine in a.m.   Subsegmental pulmonary embolism Patient did endorse shortness of breath prior to admission.  It is unlikely that the small pulmonary embolism is primary driver for patient's symptoms.  He is not tachycardic, does not have an oxygen requirement.  I suspect this is an incidental finding.  That being said should be addressed. Plan: Continue heparin GTT for today Will transition to p.o. Eliquis tomorrow preparation for discharge  Telemetry monitoring   Sinus bradycardia Unclear etiology.  Patient is on beta-blocker chronically.  Suspect this may be related to underlying sepsis.  Bradycardia improved Plan: Hold beta-blocker and any AV nodal blocking agents Telemetry monitoring Vitals per unit protocol   Essential hypertension Hold all home blood pressure medications   Hyperlipidemia Hold home statin   CAD Resume home aspirin 81 mg daily   Obstructive sleep apnea CPAP nightly   Chronic back pain Oxycodone 10-20 mg restarted per home dose   Glaucoma Ophthalmic gtt. Restarted   Hyponatremia Unclear chronicity.  Appears acute.  Improving Isotonic fluid resuscitation as above Recheck in a.m.   DVT prophylaxis: Heparin GTT Code Status: Full Family Communication: Daughter via phone 2/8 Disposition Plan: Status is: Inpatient Remains inpatient appropriate because: Sepsis, acute cholangitis, E. coli bacteremia on IV antibiotics.  Anticipate discharge on 2/10  Level of care: Telemetry Medical  Consultants:   None  Procedures:  None  Antimicrobials: Zosyn   Subjective: Seen and examined.  Feels better this morning.  Was confused and somewhat "out of it "last night  Objective: Vitals:   08/27/22 2118 08/27/22 2156 08/28/22 0443 08/28/22 0743  BP:   126/72 118/71  Pulse:   93 79  Resp:   18 18  Temp: 99.6 F (37.6 C)  98.8 F (37.1 C) 98.5 F (36.9 C)  TempSrc: Oral  Oral Oral  SpO2:  97% 97% 92%  Weight:      Height:        Intake/Output Summary (Last 24 hours) at 08/28/2022 1410 Last data filed at 08/28/2022 1000 Gross per 24 hour  Intake 1534.46 ml  Output 1550 ml  Net -15.54 ml   Filed Weights   08/27/22 1432  Weight: 73.2 kg    Examination:  General exam: No acute distress Respiratory system: Clear to auscultation. Respiratory effort normal. Cardiovascular system: S1-S2, RRR, no murmurs, no pedal edema Gastrointestinal system: Soft, nondistended, TTP epigastrium, normal bowel sounds Central nervous system: Alert and oriented. No focal neurological deficits. Extremities: Symmetric 5 x 5 power. Skin: No rashes, lesions or ulcers Psychiatry: Judgement and insight appear normal. Mood & affect appropriate.     Data Reviewed: I have personally reviewed following labs and imaging studies  CBC: Recent Labs  Lab 08/26/22 1501 08/27/22 0832 08/28/22 0719  WBC 25.0* 18.9* 8.9  NEUTROABS 21.7*  --   --   HGB 13.5 12.9* 11.9*  HCT 40.2 38.7* 35.1*  MCV 90.3 91.3 89.1  PLT 152 132* [id number]*   Basic Metabolic Panel: Recent Labs  Lab 08/26/22 1501 08/27/22 0832 08/27/22 2122 08/28/22 0719  NA 127* 133* 128* 130*  K 4.8 4.3 4.0 3.9  CL 96* 104 99 103  CO2 22 22 21* 20*  GLUCOSE 112* 97 102* 90  BUN 29* 22 17 16  $ CREATININE 1.74* 1.24 0.98 1.01  CALCIUM 8.5* 8.2* 8.2* 7.7*  MG 1.9  --   --   --    GFR: Estimated Creatinine Clearance: 58.2 mL/min (by C-G formula based on SCr of 1.01 mg/dL). Liver Function Tests: Recent Labs  Lab 08/26/22 1501  08/27/22 0832 08/27/22 2122 08/28/22 0719  AST 211* 99* 71* 49*  ALT 267* 164* 136* 109*  ALKPHOS 117 106 106 96  BILITOT 2.5* 1.2 1.2 1.1  PROT 6.6 5.8* 6.4* 5.9*  ALBUMIN 3.3* 2.9* 3.0* 2.8*   Recent Labs  Lab 08/26/22 1501 08/27/22 0832  LIPASE 121* 53*   Recent Labs  Lab 08/27/22 2122  AMMONIA 16   Coagulation Profile: Recent Labs  Lab 08/26/22 1832  INR 1.2   Cardiac Enzymes: No results for input(s): "CKTOTAL", "CKMB", "CKMBINDEX", "TROPONINI" in the last 168 hours. BNP (last 3 results) No results for input(s): "PROBNP" in the last 8760 hours. HbA1C: No results for input(s): "HGBA1C" in the last 72 hours. CBG: Recent Labs  Lab 08/27/22 2035 08/28/22 0813  GLUCAP 111* 97   Lipid Profile: No results for input(s): "CHOL", "HDL", "LDLCALC", "TRIG", "CHOLHDL", "LDLDIRECT" in the last 72 hours. Thyroid Function Tests: No results for input(s): "TSH", "T4TOTAL", "FREET4", "T3FREE", "THYROIDAB" in the last 72 hours. Anemia Panel: No results for input(s): "VITAMINB12", "FOLATE", "FERRITIN", "TIBC", "IRON", "RETICCTPCT" in the last 72 hours. Sepsis Labs: Recent Labs  Lab 08/26/22 1508 08/26/22 1832 08/27/22 0837  LATICACIDVEN 2.2* 2.2* 1.1    Recent Results (from the past 240  hour(s))  Culture, blood (routine x 2)     Status: None (Preliminary result)   Collection Time: 08/26/22  3:08 PM   Specimen: BLOOD  Result Value Ref Range Status   Specimen Description BLOOD BLOOD LEFT ARM  Final   Special Requests BOTTLES DRAWN AEROBIC AND ANAEROBIC BCAV  Final   Culture  Setup Time   Final    GRAM NEGATIVE RODS IN BOTH AEROBIC AND ANAEROBIC BOTTLES CRITICAL VALUE NOTED.  VALUE IS CONSISTENT WITH PREVIOUSLY REPORTED AND CALLED VALUE. Performed at [facility], [address], [place], [place] [address]    Culture GRAM NEGATIVE RODS  Final   Report Status PENDING  Incomplete  Culture, blood (routine x 2)     Status: Abnormal (Preliminary result)    Collection Time: 08/26/22  4:09 PM   Specimen: BLOOD  Result Value Ref Range Status   Specimen Description   Final    BLOOD LEFT ANTECUBITAL Performed at [facility], [address] [place], [place] [address]    Special Requests   Final    BOTTLES DRAWN AEROBIC AND ANAEROBIC Blood Culture adequate volume Performed at [facility], [address], [place], [place] [address]    Culture  Setup Time   Final    GRAM NEGATIVE RODS IN BOTH AEROBIC AND ANAEROBIC BOTTLES Organism ID to follow CRITICAL RESULT CALLED TO, READ BACK BY AND VERIFIED WITHPamelia Hoit PHARMD 1015 08/27/22 HNM Performed at [facility], [address] [place], [place] [address]    Culture (A)  Final    ESCHERICHIA COLI SUSCEPTIBILITIES TO FOLLOW Performed at [facility], [place] [address] [place],  [address]    Report Status PENDING  Incomplete  Blood Culture ID Panel (Reflexed)     Status: Abnormal   Collection Time: 08/26/22  4:09 PM  Result Value Ref Range Status   Enterococcus faecalis NOT DETECTED NOT DETECTED Final   Enterococcus Faecium NOT DETECTED NOT DETECTED Final   Listeria monocytogenes NOT DETECTED NOT DETECTED Final   Staphylococcus species NOT DETECTED NOT DETECTED Final   Staphylococcus aureus (BCID) NOT DETECTED NOT DETECTED Final   Staphylococcus epidermidis NOT DETECTED NOT DETECTED Final   Staphylococcus lugdunensis NOT DETECTED NOT DETECTED Final   Streptococcus species NOT DETECTED NOT DETECTED Final   Streptococcus agalactiae NOT DETECTED NOT DETECTED Final   Streptococcus pneumoniae NOT DETECTED NOT DETECTED Final   Streptococcus pyogenes NOT DETECTED NOT DETECTED Final   A.calcoaceticus-baumannii NOT DETECTED NOT DETECTED Final   Bacteroides fragilis NOT DETECTED NOT DETECTED Final   Enterobacterales DETECTED (A) NOT DETECTED Final    Comment: Enterobacterales represent a large order of gram negative bacteria, not a single  organism. CRITICAL RESULT CALLED TO, READ BACK BY AND VERIFIED WITH: MORGAN GOBBLE PHARMD 1015 08/27/22 HNM    Enterobacter cloacae complex NOT DETECTED NOT DETECTED Final   Escherichia coli DETECTED (A) NOT DETECTED Final    Comment: CRITICAL RESULT CALLED TO, READ BACK BY AND VERIFIED WITH: MORGAN GOBBLE PHARMD 1015 08/27/22 HNM    Klebsiella aerogenes NOT DETECTED NOT DETECTED Final   Klebsiella oxytoca NOT DETECTED NOT DETECTED Final   Klebsiella pneumoniae NOT DETECTED NOT DETECTED Final   Proteus species NOT DETECTED NOT DETECTED Final   Salmonella species NOT DETECTED NOT DETECTED Final   Serratia marcescens NOT DETECTED NOT DETECTED Final   Haemophilus influenzae NOT DETECTED NOT DETECTED Final   Neisseria meningitidis NOT DETECTED NOT DETECTED Final   Pseudomonas aeruginosa NOT  DETECTED NOT DETECTED Final   Stenotrophomonas maltophilia NOT DETECTED NOT DETECTED Final   Candida albicans NOT DETECTED NOT DETECTED Final   Candida auris NOT DETECTED NOT DETECTED Final   Candida glabrata NOT DETECTED NOT DETECTED Final   Candida krusei NOT DETECTED NOT DETECTED Final   Candida parapsilosis NOT DETECTED NOT DETECTED Final   Candida tropicalis NOT DETECTED NOT DETECTED Final   Cryptococcus neoformans/gattii NOT DETECTED NOT DETECTED Final   CTX-M ESBL NOT DETECTED NOT DETECTED Final   Carbapenem resistance IMP NOT DETECTED NOT DETECTED Final   Carbapenem resistance KPC NOT DETECTED NOT DETECTED Final   Carbapenem resistance NDM NOT DETECTED NOT DETECTED Final   Carbapenem resist OXA 48 LIKE NOT DETECTED NOT DETECTED Final   Carbapenem resistance VIM NOT DETECTED NOT DETECTED Final    Comment: Performed at [facility], [address], [place], [place] [address]         Radiology Studies: DG Chest Port 1 View  Result Date: 08/27/2022 CLINICAL DATA:  Dyspnea EXAM: PORTABLE CHEST 1 VIEW COMPARISON:  08/26/2022 FINDINGS: 2 frontal views of the chest demonstrate an  unremarkable cardiac silhouette. Increasing consolidation at the lung bases felt to be hypoventilatory. No effusion or pneumothorax. Stable emphysema. No acute bony abnormalities. IMPRESSION: 1. Increasing consolidation at the lung bases felt to be hypoventilatory. 2. Background emphysema. Electronically Signed   By: Randa Ngo M.D.   On: 08/27/2022 20:36   US ABDOMEN LIMITED RUQ (LIVER/GB)  Result Date: 08/26/2022 CLINICAL DATA:  Liver disease, abnormal CT, history of cholelithiasis EXAM: ULTRASOUND ABDOMEN LIMITED RIGHT UPPER QUADRANT COMPARISON:  07/17/2022, 08/26/2022, 06/17/2022 FINDINGS: Gallbladder: Gallbladder is moderately distended with only minimal dependent sludge identified. No shadowing gallstones. No gallbladder wall thickening. Trace pericholecystic fluid is unchanged since earlier CT. Negative sonographic Murphy sign. Common bile duct: Diameter: 8 mm Liver: Liver demonstrates normal echotexture without focal parenchymal abnormality. Specifically, there are no sonographic findings to correspond to the diffuse nodularity seen on the early relatively arterial phase of the preceding CT. No intrahepatic biliary duct dilation. Portal vein is patent on color Doppler imaging with normal direction of blood flow towards the liver. Other: None. IMPRESSION: 1. Minimal gallbladder sludge. No evidence of cholelithiasis or cholecystitis. 2. Grossly normal sonographic appearance of the liver. Specifically, no findings to correspond to the enhancing nodularity seen on preceding CT. 3. Trace pericholecystic fluid unchanged. Electronically Signed   By: Randa Ngo M.D.   On: 08/26/2022 17:58   CT Angio Chest PE W/Cm &/Or Wo Cm  Result Date: 08/26/2022 CLINICAL DATA:  Short of breath. Concern for pulmonary embolism. Short of breath. Chest pain. Post upper endoscopy. EXAM: CT ANGIOGRAPHY CHEST CT ABDOMEN AND PELVIS WITH CONTRAST TECHNIQUE: Multidetector CT imaging of the chest was performed using the  standard protocol during bolus administration of intravenous contrast. Multiplanar CT image reconstructions and MIPs were obtained to evaluate the vascular anatomy. Multidetector CT imaging of the abdomen and pelvis was performed using the standard protocol during bolus administration of intravenous contrast. RADIATION DOSE REDUCTION: This exam was performed according to the departmental dose-optimization program which includes automated exposure control, adjustment of the mA and/or kV according to patient size and/or use of iterative reconstruction technique. CONTRAST:  25m OMNIPAQUE IOHEXOL 350 MG/ML SOLN COMPARISON:  CT 06/17/2022 FINDINGS: CTA CHEST FINDINGS Cardiovascular: Tiny nonocclusive thin filling defect within segmental branch of the RIGHT lobe pulmonary artery tree (image 253/series 4) is consistent with a small pulmonary embolism. No additional filling defects within the  LEFT or RIGHT pulmonary arteries. Overall clot burden is very low unlikely symptomatic. Mediastinum/Nodes: No axillary or supraclavicular adenopathy. No mediastinal or hilar adenopathy. No pericardial fluid. Esophagus normal. Lungs/Pleura: No pulmonary infarction. No pneumonia. No pleural fluid. No pneumothorax Mild bibasilar atelectasis. Centrilobular emphysema the upper lobes. Musculoskeletal: No aggressive osseous lesion Review of the MIP images confirms the above findings. CT ABDOMEN and PELVIS FINDINGS Lower chest: Lung bases are clear. Hepatobiliary: Multiple small enhancing nodules throughout the LEFT and RIGHT hepatic lobe. There are no similar nodules on comparison contrast MRI (07/17/2022). Small amount pericholecystic fluid. Gallbladder nondistended. Common bile duct measures 8 mm similar to comparison MRI. Pancreas: Pancreatic duct is prominent but not changed from comparison MRI. No pancreatic atrophy. Spleen: Normal spleen Adrenals/urinary tract: Adrenal glands and kidneys are normal. The ureters and bladder normal.  Stomach/Bowel: Stomach, small bowel, appendix, and cecum are normal. The colon and rectosigmoid colon are normal. Vascular/Lymphatic: Abdominal aorta is normal caliber with atherosclerotic calcification. There is no retroperitoneal or periportal lymphadenopathy. No pelvic lymphadenopathy. Reproductive: Prostate unremarkable Other: No free fluid. Musculoskeletal: No aggressive osseous lesion. Review of the MIP images confirms the above findings. IMPRESSION: CHEST IMPRESSION: 1. Small nonocclusive pulmonary embolism within a segmental branch of the RIGHT lower lobe pulmonary artery tree. Overall clot burden minimal/trace. 2. No pulmonary infarction or pneumonia. PELVIS IMPRESSION: 1. multiple small enhancing nodules throughout the LEFT and RIGHT hepatic lobe. No corresponding findings on recent abdominal MRI (07/17/2022). Consider hepatic ultrasound to exclude new nodularity. 1. Small amount pericholecystic fluid without gallbladder distension. Mild biliary duct dilatation. Recommend correlation with bilirubin evaluate for cholecystitis. 2. Stable dilatation of the pancreatic duct. 3. Aortic Atherosclerosis (ICD10-I70.0) and Emphysema (ICD10-J43.9). Findings conveyed toCHARLES JESSUP on 08/26/2022  at16:48. Electronically Signed   By: Suzy Bouchard M.D.   On: 08/26/2022 16:48   CT Abdomen Pelvis W Contrast  Result Date: 08/26/2022 CLINICAL DATA:  Short of breath. Concern for pulmonary embolism. Short of breath. Chest pain. Post upper endoscopy. EXAM: CT ANGIOGRAPHY CHEST CT ABDOMEN AND PELVIS WITH CONTRAST TECHNIQUE: Multidetector CT imaging of the chest was performed using the standard protocol during bolus administration of intravenous contrast. Multiplanar CT image reconstructions and MIPs were obtained to evaluate the vascular anatomy. Multidetector CT imaging of the abdomen and pelvis was performed using the standard protocol during bolus administration of intravenous contrast. RADIATION DOSE REDUCTION: This  exam was performed according to the departmental dose-optimization program which includes automated exposure control, adjustment of the mA and/or kV according to patient size and/or use of iterative reconstruction technique. CONTRAST:  14m OMNIPAQUE IOHEXOL 350 MG/ML SOLN COMPARISON:  CT 06/17/2022 FINDINGS: CTA CHEST FINDINGS Cardiovascular: Tiny nonocclusive thin filling defect within segmental branch of the RIGHT lobe pulmonary artery tree (image 253/series 4) is consistent with a small pulmonary embolism. No additional filling defects within the LEFT or RIGHT pulmonary arteries. Overall clot burden is very low unlikely symptomatic. Mediastinum/Nodes: No axillary or supraclavicular adenopathy. No mediastinal or hilar adenopathy. No pericardial fluid. Esophagus normal. Lungs/Pleura: No pulmonary infarction. No pneumonia. No pleural fluid. No pneumothorax Mild bibasilar atelectasis. Centrilobular emphysema the upper lobes. Musculoskeletal: No aggressive osseous lesion Review of the MIP images confirms the above findings. CT ABDOMEN and PELVIS FINDINGS Lower chest: Lung bases are clear. Hepatobiliary: Multiple small enhancing nodules throughout the LEFT and RIGHT hepatic lobe. There are no similar nodules on comparison contrast MRI (07/17/2022). Small amount pericholecystic fluid. Gallbladder nondistended. Common bile duct measures 8 mm similar to comparison MRI. Pancreas: Pancreatic duct  is prominent but not changed from comparison MRI. No pancreatic atrophy. Spleen: Normal spleen Adrenals/urinary tract: Adrenal glands and kidneys are normal. The ureters and bladder normal. Stomach/Bowel: Stomach, small bowel, appendix, and cecum are normal. The colon and rectosigmoid colon are normal. Vascular/Lymphatic: Abdominal aorta is normal caliber with atherosclerotic calcification. There is no retroperitoneal or periportal lymphadenopathy. No pelvic lymphadenopathy. Reproductive: Prostate unremarkable Other: No free  fluid. Musculoskeletal: No aggressive osseous lesion. Review of the MIP images confirms the above findings. IMPRESSION: CHEST IMPRESSION: 1. Small nonocclusive pulmonary embolism within a segmental branch of the RIGHT lower lobe pulmonary artery tree. Overall clot burden minimal/trace. 2. No pulmonary infarction or pneumonia. PELVIS IMPRESSION: 1. multiple small enhancing nodules throughout the LEFT and RIGHT hepatic lobe. No corresponding findings on recent abdominal MRI (07/17/2022). Consider hepatic ultrasound to exclude new nodularity. 1. Small amount pericholecystic fluid without gallbladder distension. Mild biliary duct dilatation. Recommend correlation with bilirubin evaluate for cholecystitis. 2. Stable dilatation of the pancreatic duct. 3. Aortic Atherosclerosis (ICD10-I70.0) and Emphysema (ICD10-J43.9). Findings conveyed toCHARLES JESSUP on 08/26/2022  at16:48. Electronically Signed   By: Suzy Bouchard M.D.   On: 08/26/2022 16:48   DG Chest Port 1 View  Result Date: 08/26/2022 CLINICAL DATA:  Shortness of breath EXAM: PORTABLE CHEST 1 VIEW COMPARISON:  05/16/2022, CT chest 06/17/2022 FINDINGS: No acute airspace disease or effusion. Mild emphysema. Stable cardiomediastinal silhouette with aortic atherosclerosis. No pneumothorax. IMPRESSION: No active disease. Emphysema. Electronically Signed   By: Donavan Foil M.D.   On: 08/26/2022 15:25        Scheduled Meds:  benzonatate  200 mg Oral TID   latanoprost  1 drop Right Eye QHS   metoprolol succinate  25 mg Oral QHS   Continuous Infusions:  heparin 1,350 Units/hr (08/28/22 1045)   piperacillin-tazobactam (ZOSYN)  IV 3.375 g (08/28/22 0507)     LOS: 2 days      [name] Ace, MD [facility]   If 7PM-7AM, please contact night-coverage  08/28/2022, 2:10 PM

## 2022-08-28 NOTE — Consult Note (Signed)
[place] for IV Heparin Indication: pulmonary embolus  Patient Measurements: Height: 5' 10"$  (177.8 cm) Weight: 73.2 kg (161 lb 6 oz) IBW/kg (Calculated) : 73 Heparin Dosing Weight: 69.9 kg  Labs: Recent Labs    08/26/22 1501 08/26/22 1832 08/27/22 0206 08/27/22 0832 08/27/22 0953 08/27/22 2122 08/28/22 0719  HGB 13.5  --   --  12.9*  --   --  11.9*  HCT 40.2  --   --  38.7*  --   --  35.1*  PLT 152  --   --  132*  --   --  103*  APTT  --  24  --   --   --   --   --   LABPROT  --  15.2  --   --   --   --   --   INR  --  1.2  --   --   --   --   --   HEPARINUNFRC  --   --  0.43  --  0.36  --  0.11*  CREATININE 1.74*  --   --  1.24  --  0.98 1.01  TROPONINIHS 35*  --   --   --   --   --   --      Estimated Creatinine Clearance: 58.2 mL/min (by C-G formula based on SCr of 1.01 mg/dL).   Medical History: Past Medical History:  Diagnosis Date   Adenomatous polyp    Carpal tunnel syndrome on left    Cervical spine disease    Coronary artery disease    S/p anterior MI tx with PCI in 1998  //  Myoview 6/22: EF 45, ant and septal scar; no ischemia; intermediate risk // Echocardiogram 7/22: EF 45-50, ant-sept and apical HK, Gr 1 DD, normal RVSF, mild MR   Hyperlipidemia    Hypertension    Ischemic cardiomyopathy 05/19/2022   Echocardiogram 06/02/22: EF 45-50, global HK, Gr 1 DD, NL RVSF, NL PASP (RVSP 26.4), trivial MR, RAP 3   Low back pain    Myocardial infarction ([place]) 1998   Sleep apnea    CPAP   Wears dentures    full upper    Medications:  No anticoagulation prior to admission per my chart review. Medication reconciliation is pending  Assessment: 82 y/o M presenting to the ED 2/7 for dizziness, hypotension, lightheadedness. Patient had ERCP 2/6. Incidentally found to have acute PE with low clot burden. Pharmacy consulted to initiate and manage heparin infusion for acute PE.  2/8 0206 HL 0.43 2/8 0953 HL 0.36 2/9 0719 HL  0.11 2/9 1707 HL 0.20  Goal of Therapy:  Heparin level 0.3-0.7 units/ml Monitor platelets by anticoagulation protocol: Yes   Plan: Heparin level is subtherapeutic Order heparin 1050 units IV x 1 Increase heparin drip rate to 1500 units/hr Recheck heparin level 8 hours after rate change Daily CBC while on heparin  Will M. Ouida Sills, PharmD PGY-1 Pharmacy Resident 08/28/2022 5:41 PM

## 2022-08-28 NOTE — Progress Notes (Signed)
       CROSS COVER NOTE  NAME: Brian [name] MRN: [id number] DOB : [birth year]    HPI/Events of Note   Notified by nurse patient's daughter at bedside with concern regarding patients mental status and confusion  Assessment and  Interventions   Assessment: General - no apparent distress, febrile 99.6 Neuro - lethargic, oriented but awake only for brief periods Resp - shallow breathing, crackles LLL CV  - BP stable  Plan: CMP sodium 128 CO2 21 ; albumin  3.0 NH3 normal at 16, Creat 0.98 down from 1.24, AST/ALT improving VBG 7.46/31/49/22 - resp alkalosis Chest xray - per report LL base consolidation from hypoventilation and decreased lung volumes  Ensure CPAP is placed tonight Consider hypoactive delirium/metabolic encephalopathy      Kathlene Cote NP [facility]

## 2022-08-28 NOTE — Care Management Important Message (Signed)
Important Message  Patient Details  Name: Brian [name] MRN: [id number] Date of Birth: [birth year]   Medicare Important Message Given:  Yes     Dannette Barbara 08/28/2022, 1:01 PM

## 2022-08-28 NOTE — TOC Initial Note (Signed)
Transition of Care [facility]) - Initial/Assessment Note    Patient Details  Name: Brian [name] MRN: [id number] Date of Birth: 1941/07/01  Transition of Care [facility]) CM/SW Contact:    Beverly Sessions, RN Phone Number: 08/28/2022, 1:21 PM  Clinical Narrative:                   Transition of Care [facility]) Screening Note   Patient Details  Name: Brian [name] Date of Birth: [birth year]   Transition of Care [facility]) CM/SW Contact:    Beverly Sessions, RN Phone Number: 08/28/2022, 1:21 PM    Transition of Care Department [facility]) has reviewed patient and no TOC needs have been identified at this time. We will continue to monitor patient advancement through interdisciplinary progression rounds. If new patient transition needs arise, please place a TOC consult.          Patient Goals and CMS Choice            Expected Discharge Plan and Services                                              Prior Living Arrangements/Services                       Activities of Daily Living Home Assistive Devices/Equipment: Dentures (specify type), Eyeglasses, Hearing aid ADL Screening (condition at time of admission) Patient's cognitive ability adequate to safely complete daily activities?: Yes Is the patient deaf or have difficulty hearing?: No Does the patient have difficulty seeing, even when wearing glasses/contacts?: No Does the patient have difficulty concentrating, remembering, or making decisions?: No Patient able to express need for assistance with ADLs?: Yes Does the patient have difficulty dressing or bathing?: No Independently performs ADLs?: Yes (appropriate for developmental age) Does the patient have difficulty walking or climbing stairs?: No Weakness of Legs: None Weakness of Arms/Hands: None  Permission Sought/Granted                  Emotional Assessment              Admission diagnosis:  Cholangitis [K83.09] Abnormal CT of liver  [R93.2] Sepsis without acute organ dysfunction, due to unspecified organism [facility]) [A41.9] Single subsegmental pulmonary embolism without acute cor pulmonale (HCC) [I26.93] Patient Active Problem List   Diagnosis Date Noted   Cholangitis 08/26/2022   Choledocholithiasis 08/25/2022   Stenosis of left subclavian artery ([place]) 07/01/2022   Ascending aortic aneurysm ([place]) 07/01/2022   Dizziness 05/19/2022   Shortness of breath 05/19/2022   Ischemic cardiomyopathy 05/19/2022   Hematochezia    Polyp of ascending colon    Polyp of sigmoid colon    Prostate nodule 05/22/2019   Other chronic pain 05/22/2019   Pulmonary nodule 05/22/2019   Other long term (current) drug therapy 05/22/2019   Nicotine dependence, unspecified, uncomplicated [id number]   Sleep apnea    Low back pain    Hypertension    Coronary artery disease involving native coronary artery of native heart with angina pectoris ([place])    Cervical spine disease    Carpal tunnel syndrome on left    Adenomatous polyp    BRBPR (bright red blood per rectum) 02/14/2016   Aortic calcification ([place]) 05/08/2015   Enteritis due to Clostridium difficile 02/21/2015   Ischemic bowel disease ([place])    Hyperlipidemia  02/05/2010   TOBACCO USER 02/05/2010   Hypersomnia with sleep apnea 02/05/2010   Myocardial infarction ([place]) 07/20/1996   PCP:  [name], MD Pharmacy:   [facility] [place], [place] [facility] [address] AT [place] [facility] [place] [address] Phone: [phone] Fax: [phone]     Social Determinants of Health (SDOH) Social History: SDOH Screenings   Food Insecurity: No Food Insecurity (08/27/2022)  Housing: Low Risk  (08/27/2022)  Transportation Needs: No Transportation Needs (08/27/2022)  Utilities: Not At Risk (08/27/2022)  Tobacco Use: High Risk (08/27/2022)   SDOH Interventions:     Readmission Risk Interventions     No data to display

## 2022-08-28 NOTE — Consult Note (Signed)
[place] for IV Heparin Indication: pulmonary embolus  Patient Measurements: Height: 5' 10"$  (177.8 cm) Weight: 73.2 kg (161 lb 6 oz) IBW/kg (Calculated) : 73 Heparin Dosing Weight: 69.9 kg  Labs: Recent Labs    08/26/22 1501 08/26/22 1832 08/27/22 0206 08/27/22 0832 08/27/22 0953 08/27/22 2122 08/28/22 0719  HGB 13.5  --   --  12.9*  --   --  11.9*  HCT 40.2  --   --  38.7*  --   --  35.1*  PLT 152  --   --  132*  --   --  103*  APTT  --  24  --   --   --   --   --   LABPROT  --  15.2  --   --   --   --   --   INR  --  1.2  --   --   --   --   --   HEPARINUNFRC  --   --  0.43  --  0.36  --  0.11*  CREATININE 1.74*  --   --  1.24  --  0.98 1.01  TROPONINIHS 35*  --   --   --   --   --   --      Estimated Creatinine Clearance: 58.2 mL/min (by C-G formula based on SCr of 1.01 mg/dL).   Medical History: Past Medical History:  Diagnosis Date   Adenomatous polyp    Carpal tunnel syndrome on left    Cervical spine disease    Coronary artery disease    S/p anterior MI tx with PCI in 1998  //  Myoview 6/22: EF 45, ant and septal scar; no ischemia; intermediate risk // Echocardiogram 7/22: EF 45-50, ant-sept and apical HK, Gr 1 DD, normal RVSF, mild MR   Hyperlipidemia    Hypertension    Ischemic cardiomyopathy 05/19/2022   Echocardiogram 06/02/22: EF 45-50, global HK, Gr 1 DD, NL RVSF, NL PASP (RVSP 26.4), trivial MR, RAP 3   Low back pain    Myocardial infarction ([place]) 1998   Sleep apnea    CPAP   Wears dentures    full upper    Medications:  No anticoagulation prior to admission per my chart review. Medication reconciliation is pending  Assessment: 82 y/o M presenting to the ED 2/7 for dizziness, hypotension, lightheadedness. Patient had ERCP 2/6. Incidentally found to have acute PE with low clot burden. Pharmacy consulted to initiate and manage heparin infusion for acute PE.  2/8 0206 HL 0.43 2/8 0953 HL 0.36.  2/9 0719  HL 0.11  Goal of Therapy:  Heparin level 0.3-0.7 units/ml Monitor platelets by anticoagulation protocol: Yes     Plan: Heparin level is subtherapeutic Order heparin 2000 units IV x 1 Increase heparin drip rate to 1350 units/hr Recheck heparin level 8 hours after rate change Daily CBC while on heparin  Tollie Eth III, PharmD 08/28/2022 8:44 AM

## 2022-08-29 ENCOUNTER — Encounter: Payer: Self-pay | Admitting: Internal Medicine

## 2022-08-29 ENCOUNTER — Inpatient Hospital Stay: Payer: Medicare Other

## 2022-08-29 DIAGNOSIS — K8309 Other cholangitis: Secondary | ICD-10-CM | POA: Diagnosis not present

## 2022-08-29 LAB — COMPREHENSIVE METABOLIC PANEL
ALT: 78 U/L — ABNORMAL HIGH (ref 0–44)
AST: 32 U/L (ref 15–41)
Albumin: 2.7 g/dL — ABNORMAL LOW (ref 3.5–5.0)
Alkaline Phosphatase: 99 U/L (ref 38–126)
Anion gap: 7 (ref 5–15)
BUN: 16 mg/dL (ref 8–23)
CO2: 22 mmol/L (ref 22–32)
Calcium: 8.1 mg/dL — ABNORMAL LOW (ref 8.9–10.3)
Chloride: 98 mmol/L (ref 98–111)
Creatinine, Ser: 0.98 mg/dL (ref 0.61–1.24)
GFR, Estimated: >60 mL/min (ref >60)
Glucose, Bld: 91 mg/dL (ref 70–99)
Potassium: 3.9 mmol/L (ref 3.5–5.1)
Sodium: 127 mmol/L — ABNORMAL LOW (ref 135–145)
Total Bilirubin: 0.9 mg/dL (ref 0.3–1.2)
Total Protein: 6.1 g/dL — ABNORMAL LOW (ref 6.5–8.1)

## 2022-08-29 LAB — CBC
HCT: 34 % — ABNORMAL LOW (ref 39.0–52.0)
Hemoglobin: 11.7 g/dL — ABNORMAL LOW (ref 13.0–17.0)
MCH: 29.8 pg (ref 26.0–34.0)
MCHC: 34.4 g/dL (ref 30.0–36.0)
MCV: 86.7 fL (ref 80.0–100.0)
Platelets: 99 10*3/uL — ABNORMAL LOW (ref 150–400)
RBC: 3.92 MIL/uL — ABNORMAL LOW (ref 4.22–5.81)
RDW: 12.8 % (ref 11.5–15.5)
WBC: 6.7 10*3/uL (ref 4.0–10.5)
nRBC: 0 % (ref 0.0–0.2)

## 2022-08-29 LAB — CULTURE, BLOOD (ROUTINE X 2): Special Requests: ADEQUATE

## 2022-08-29 LAB — HEPARIN LEVEL (UNFRACTIONATED): Heparin Unfractionated: 0.22 IU/mL — ABNORMAL LOW (ref 0.30–0.70)

## 2022-08-29 MED ORDER — CIPROFLOXACIN HCL 500 MG PO TABS: 500.0000 mg | ORAL_TABLET | Freq: Two times a day (BID) | ORAL | 0 refills | Status: AC

## 2022-08-29 MED ORDER — CIPROFLOXACIN HCL 500 MG PO TABS
500.0000 mg | ORAL_TABLET | Freq: Two times a day (BID) | ORAL | Status: DC
  Administered 2022-08-29: 500 mg via ORAL
  Filled 2022-08-29: qty 1

## 2022-08-29 MED ORDER — MECLIZINE HCL 25 MG PO TABS
25.0000 mg | ORAL_TABLET | Freq: Two times a day (BID) | ORAL | Status: DC | PRN
  Filled 2022-08-29: qty 1

## 2022-08-29 MED ORDER — BENZONATATE 200 MG PO CAPS: 200.0000 mg | ORAL_CAPSULE | Freq: Three times a day (TID) | ORAL | 0 refills | Status: DC | PRN

## 2022-08-29 MED ORDER — APIXABAN 5 MG PO TABS
10.0000 mg | ORAL_TABLET | Freq: Two times a day (BID) | ORAL | Status: DC
  Administered 2022-08-29: 10 mg via ORAL
  Filled 2022-08-29: qty 2

## 2022-08-29 MED ORDER — MECLIZINE HCL 25 MG PO TABS: 25.0000 mg | ORAL_TABLET | Freq: Two times a day (BID) | ORAL | 0 refills | Status: DC | PRN

## 2022-08-29 MED ORDER — HEPARIN BOLUS VIA INFUSION
1050.0000 [IU] | Freq: Once | INTRAVENOUS | Status: AC
  Administered 2022-08-29: 1050 [IU] via INTRAVENOUS
  Filled 2022-08-29: qty 1050

## 2022-08-29 MED ORDER — APIXABAN 5 MG PO TABS: ORAL_TABLET | ORAL | 0 refills | Status: DC

## 2022-08-29 MED ORDER — APIXABAN 5 MG PO TABS: 5.0000 mg | ORAL_TABLET | Freq: Two times a day (BID) | ORAL | Status: DC

## 2022-08-29 NOTE — Consult Note (Signed)
[place] for IV Heparin Indication: pulmonary embolus  Patient Measurements: Height: 5' 10"$  (177.8 cm) Weight: 73.2 kg (161 lb 6 oz) IBW/kg (Calculated) : 73 Heparin Dosing Weight: 69.9 kg  Labs: Recent Labs    08/26/22 1501 08/26/22 1832 08/27/22 0206 08/27/22 0832 08/27/22 0953 08/27/22 2122 08/28/22 0719 08/28/22 1707 08/29/22 0333  HGB 13.5  --   --  12.9*  --   --  11.9*  --  11.7*  HCT 40.2  --   --  38.7*  --   --  35.1*  --  34.0*  PLT 152  --   --  132*  --   --  103*  --  99*  APTT  --  24  --   --   --   --   --   --   --   LABPROT  --  15.2  --   --   --   --   --   --   --   INR  --  1.2  --   --   --   --   --   --   --   HEPARINUNFRC  --   --    < >  --    < >  --  0.11* 0.20* 0.22*  CREATININE 1.74*  --   --  1.24  --  0.98 1.01  --  0.98  TROPONINIHS 35*  --   --   --   --   --   --   --   --    < > = values in this interval not displayed.     Estimated Creatinine Clearance: 60 mL/min (by C-G formula based on SCr of 0.98 mg/dL).   Medical History: Past Medical History:  Diagnosis Date   Adenomatous polyp    Carpal tunnel syndrome on left    Cervical spine disease    Coronary artery disease    S/p anterior MI tx with PCI in 1998  //  Myoview 6/22: EF 45, ant and septal scar; no ischemia; intermediate risk // Echocardiogram 7/22: EF 45-50, ant-sept and apical HK, Gr 1 DD, normal RVSF, mild MR   Hyperlipidemia    Hypertension    Ischemic cardiomyopathy 05/19/2022   Echocardiogram 06/02/22: EF 45-50, global HK, Gr 1 DD, NL RVSF, NL PASP (RVSP 26.4), trivial MR, RAP 3   Low back pain    Myocardial infarction ([place]) 1998   Sleep apnea    CPAP   Wears dentures    full upper    Medications:  No anticoagulation prior to admission per my chart review. Medication reconciliation is pending  Assessment: 82 y/o M presenting to the ED 2/7 for dizziness, hypotension, lightheadedness. Patient had ERCP 2/6.  Incidentally found to have acute PE with low clot burden. Pharmacy consulted to initiate and manage heparin infusion for acute PE.  2/8 0206 HL 0.43 2/8 0953 HL 0.36 2/9 0719 HL 0.11 2/9 1707 HL 0.20 2/10 0333 HL 0.22  Goal of Therapy:  Heparin level 0.3-0.7 units/ml Monitor platelets by anticoagulation protocol: Yes   Plan: Heparin level is subtherapeutic Order heparin 1050 units IV x 1 Increase heparin drip rate to 1650 units/hr Recheck heparin level 8 hours after rate change Daily CBC while on heparin  Renda Rolls, PharmD, [facility] 08/29/2022 4:10 AM

## 2022-08-29 NOTE — Progress Notes (Signed)
Daughter notified of discharge and will be picking up patient.  Discharge instructions reviewed by phone with her who verbalized understanding and acceptance.  Packet reviewed and given to patient as well.  Awaiting transport.

## 2022-08-29 NOTE — Plan of Care (Signed)

## 2022-08-29 NOTE — Progress Notes (Signed)
Discharge instructions were reviewed with the patient. Pt denies pain at this time. IV was taken out. Questions were encouraged and answered. Pt's daughter was notified about DC instructions. Belongings were collected by the patient.

## 2022-08-29 NOTE — Consult Note (Signed)
[place] for IV Heparin >> apixaban Indication: pulmonary embolus  Patient Measurements: Height: 5' 10"$  (177.8 cm) Weight: 73.2 kg (161 lb 6 oz) IBW/kg (Calculated) : 73  Labs: Recent Labs    08/26/22 1501 08/26/22 1832 08/27/22 0206 08/27/22 0832 08/27/22 0953 08/27/22 2122 08/28/22 0719 08/28/22 1707 08/29/22 0333  HGB 13.5  --   --  12.9*  --   --  11.9*  --  11.7*  HCT 40.2  --   --  38.7*  --   --  35.1*  --  34.0*  PLT 152  --   --  132*  --   --  103*  --  99*  APTT  --  24  --   --   --   --   --   --   --   LABPROT  --  15.2  --   --   --   --   --   --   --   INR  --  1.2  --   --   --   --   --   --   --   HEPARINUNFRC  --   --    < >  --    < >  --  0.11* 0.20* 0.22*  CREATININE 1.74*  --   --  1.24  --  0.98 1.01  --  0.98  TROPONINIHS 35*  --   --   --   --   --   --   --   --    < > = values in this interval not displayed.     Estimated Creatinine Clearance: 60 mL/min (by C-G formula based on SCr of 0.98 mg/dL).   Medical History: Past Medical History:  Diagnosis Date   Adenomatous polyp    Carpal tunnel syndrome on left    Cervical spine disease    Coronary artery disease    S/p anterior MI tx with PCI in 1998  //  Myoview 6/22: EF 45, ant and septal scar; no ischemia; intermediate risk // Echocardiogram 7/22: EF 45-50, ant-sept and apical HK, Gr 1 DD, normal RVSF, mild MR   Hyperlipidemia    Hypertension    Ischemic cardiomyopathy 05/19/2022   Echocardiogram 06/02/22: EF 45-50, global HK, Gr 1 DD, NL RVSF, NL PASP (RVSP 26.4), trivial MR, RAP 3   Low back pain    Myocardial infarction ([place]) 1998   Sleep apnea    CPAP   Wears dentures    full upper    Medications:  No anticoagulation prior to admission per my chart review. Medication reconciliation is pending  Assessment: 82 y/o M presenting to the ED 2/7 for dizziness, hypotension, lightheadedness. Patient had ERCP 2/6. Incidentally found to have  acute PE with low clot burden. Pharmacy consulted to transition heparin infusion to apixaban for acute PE.  Plan:  --Discontinue IV heparin --Start apixaban 10 mg BID x 7 days followed by apixaban 5 mg BID for remaining duration of therapy --CBC per protocol  [name]  08/29/2022 7:25 AM

## 2022-08-29 NOTE — Discharge Summary (Signed)
Physician Discharge Summary  Brian [name] [id number] DOB: 24-Jun-1941 DOA: 08/26/2022  PCP: [name], MD  Admit date: 08/26/2022 Discharge date: 08/29/2022  Admitted From: Home Disposition:  Home  Recommendations for Outpatient Follow-up:  Follow up with PCP in 1-2 weeks   Home Health:No Equipment/Devices:None   Discharge Condition:Stable  CODE STATUS:Full  Diet recommendation: reg  Brief/Interim Summary: 82 y.o. male with medical history significant of hypertension, CAD, hyperlipidemia, glaucoma who presents for evaluation 1 day after outpatient ERCP.  During ERCP biliary asymptomatically was performed and biliary sludge was noted.  Biliary tree was swept with no stones and patient was discharged home.  Subsequently in the next 24 hours patient developed dry cough and progressive shortness of breath.  He contacted gastroenterology office who directed him to the emergency department.   Upon presentation to the emergency department patient has elevated LFTs with increased white blood cell count associate with hypotension.  Hypotension was treated with 2 L bolus isotonic fluids.  Good improvement blood pressure was noted.  CT abdomen performed did not demonstrate any signs of perforation.   Incidentally a CT angio chest was performed due to shortness of breath.  Found to have a small nonocclusive subsegmental pulmonary embolism and was started on heparin gtt.  Considering working diagnosis of ascending cholangitis post ERCP as well as incidental discovery of segmental pulmonary embolism admission request was made.   2/8: Blood culture positive for E. coli.  LFTs and white blood cell count trending down 2/10: Blood culture with pansensitive E. coli.  Antibiotics de-escalated to ciprofloxacin.  Will discharge on 500 mg p.o. twice daily for total course of 14 days.  For pulmonary embolism will transition to p.o. Eliquis.  10 mg twice daily x 7 days followed by 5 mg twice daily  for 3 to 6 months.  CT head performed for persistent dizziness.  No acute findings.  As needed meclizine provided.  Discharge home.  Follow-up outpatient PCP.    Discharge Diagnoses:  Principal Problem:   Cholangitis Active Problems:   Hyperlipidemia   Hypersomnia with sleep apnea   Low back pain   Hypertension   Coronary artery disease involving native coronary artery of native heart with angina pectoris (HCC)  Post ERCP cholangitis E. coli bacteremia Elevated LFTs Severe sepsis secondary to above Patient presenting 1 day after ERCP with symptoms consistent with ascending cholangitis.  Severe sepsis criteria met with hypotension, elevated lactic acid, AKI leukocytosis.  Presumed source intra-abdominal.  Blood culture positive for E. coli, patient intra-abdominal source patient did respond to IV fluid resuscitation. Plan: Discontinue IV antibiotics.  Transition to p.o. ciprofloxacin per sensitivities.  Complete total 14-day antibiotic course.  Stable for discharge at this time.   AKI Suspect prerenal azotemia in setting of acute infection.   Improving over interval aggressive IV fluid resuscitation.   Resolved at time of discharge   Subsegmental pulmonary embolism Patient did endorse shortness of breath prior to admission.  It is unlikely that the small pulmonary embolism is primary driver for patient's symptoms.  He is not tachycardic, does not have an oxygen requirement.  I suspect this is an incidental finding.  That being said should be addressed. Plan: Discontinue heparin GTT.  Initiate Eliquis 10 mg p.o. twice daily x 7 days followed by 5 mg p.o. twice daily for 3 to 6 months.  Follow-up outpatient PCP.     Sinus bradycardia Unclear etiology.  Patient is on beta-blocker chronically.  Suspect this may be related to underlying  sepsis.  Bradycardia improved Plan: Can resume home AV nodal blocking agents at time of discharge   Essential hypertension Can resume home blood  pressure regimen at time of discharge   Hyperlipidemia Hold home statin   CAD Resume home aspirin 81 mg daily   Obstructive sleep apnea CPAP nightly   Chronic back pain Oxycodone 10-20 mg restarted per home dose   Glaucoma Ophthalmic gtt.    Hyponatremia Unclear etiology.  Mild.  Relatively asymptomatic.  Follow-up outpatient PCP  Discharge Instructions  Discharge Instructions     Diet - low sodium heart healthy   Complete by: As directed    Increase activity slowly   Complete by: As directed       Allergies as of 08/29/2022   No Known Allergies      Medication List     TAKE these medications    apixaban 5 MG Tabs tablet Commonly known as: ELIQUIS Take 2 tablets (10 mg total) by mouth 2 (two) times daily for 7 days, THEN 1 tablet (5 mg total) 2 (two) times daily. Start taking on: August 29, 2022   aspirin 81 MG tablet Take 81 mg by mouth at bedtime.   atorvastatin 40 MG tablet Commonly known as: LIPITOR Take 40 mg by mouth at bedtime.   benzonatate 200 MG capsule Commonly known as: TESSALON Take 1 capsule (200 mg total) by mouth 3 (three) times daily as needed for cough.   ciprofloxacin 500 MG tablet Commonly known as: CIPRO Take 1 tablet (500 mg total) by mouth 2 (two) times daily for 11 days.   CoQ10 100 MG Caps Take 1 capsule by mouth daily.   latanoprost 0.005 % ophthalmic solution Commonly known as: XALATAN Place 1 drop into the right eye at bedtime.   lisinopril 20 MG tablet Commonly known as: ZESTRIL Take 1 tablet (20 mg total) by mouth daily. What changed: when to take this   meclizine 25 MG tablet Commonly known as: ANTIVERT Take 1 tablet (25 mg total) by mouth 2 (two) times daily as needed for dizziness.   metoprolol succinate 25 MG 24 hr tablet Commonly known as: TOPROL-XL TAKE 1 TABLET(25 MG) BY MOUTH DAILY What changed:  how much to take how to take this when to take this additional instructions   MULTIVITAMIN  PO Take 1 tablet by mouth daily.   nitroGLYCERIN 0.4 MG SL tablet Commonly known as: NITROSTAT Place 0.4 mg under the tongue every 5 (five) minutes as needed for chest pain.   Oxycodone HCl 10 MG Tabs Take 10-20 mg by mouth every 6 (six) hours as needed (moderate to severe pain).        No Known Allergies  Consultations: None   Procedures/Studies: CT HEAD WO CONTRAST (5MM)  Result Date: 08/29/2022 CLINICAL DATA:  82 year old male with headache and dizziness. EXAM: CT HEAD WITHOUT CONTRAST TECHNIQUE: Contiguous axial images were obtained from the base of the skull through the vertex without intravenous contrast. RADIATION DOSE REDUCTION: This exam was performed according to the departmental dose-optimization program which includes automated exposure control, adjustment of the mA and/or kV according to patient size and/or use of iterative reconstruction technique. COMPARISON:  Head CT 05/14/2022.  Brain MRI 05/16/2022. FINDINGS: Brain: Stable cerebral volume. No midline shift, ventriculomegaly, mass effect, evidence of mass lesion, intracranial hemorrhage or evidence of cortically based acute infarction. Stable gray-white matter differentiation throughout the brain. Chronic confluent white matter disease. Vascular: Calcified atherosclerosis at the skull base. No suspicious intracranial vascular hyperdensity. Skull:  No acute osseous abnormality identified. Sinuses/Orbits: Tympanic cavities, Visualized paranasal sinuses and mastoids are stable and well aerated. Other: No acute orbit or scalp soft tissue finding. IMPRESSION: Stable chronic white matter disease. No acute intracranial abnormality. Electronically Signed   By: Genevie Ann M.D.   On: 08/29/2022 09:30   DG Chest Port 1 View  Result Date: 08/27/2022 CLINICAL DATA:  Dyspnea EXAM: PORTABLE CHEST 1 VIEW COMPARISON:  08/26/2022 FINDINGS: 2 frontal views of the chest demonstrate an unremarkable cardiac silhouette. Increasing consolidation at  the lung bases felt to be hypoventilatory. No effusion or pneumothorax. Stable emphysema. No acute bony abnormalities. IMPRESSION: 1. Increasing consolidation at the lung bases felt to be hypoventilatory. 2. Background emphysema. Electronically Signed   By: Randa Ngo M.D.   On: 08/27/2022 20:36   US ABDOMEN LIMITED RUQ (LIVER/GB)  Result Date: 08/26/2022 CLINICAL DATA:  Liver disease, abnormal CT, history of cholelithiasis EXAM: ULTRASOUND ABDOMEN LIMITED RIGHT UPPER QUADRANT COMPARISON:  07/17/2022, 08/26/2022, 06/17/2022 FINDINGS: Gallbladder: Gallbladder is moderately distended with only minimal dependent sludge identified. No shadowing gallstones. No gallbladder wall thickening. Trace pericholecystic fluid is unchanged since earlier CT. Negative sonographic Murphy sign. Common bile duct: Diameter: 8 mm Liver: Liver demonstrates normal echotexture without focal parenchymal abnormality. Specifically, there are no sonographic findings to correspond to the diffuse nodularity seen on the early relatively arterial phase of the preceding CT. No intrahepatic biliary duct dilation. Portal vein is patent on color Doppler imaging with normal direction of blood flow towards the liver. Other: None. IMPRESSION: 1. Minimal gallbladder sludge. No evidence of cholelithiasis or cholecystitis. 2. Grossly normal sonographic appearance of the liver. Specifically, no findings to correspond to the enhancing nodularity seen on preceding CT. 3. Trace pericholecystic fluid unchanged. Electronically Signed   By: Randa Ngo M.D.   On: 08/26/2022 17:58   CT Angio Chest PE W/Cm &/Or Wo Cm  Result Date: 08/26/2022 CLINICAL DATA:  Short of breath. Concern for pulmonary embolism. Short of breath. Chest pain. Post upper endoscopy. EXAM: CT ANGIOGRAPHY CHEST CT ABDOMEN AND PELVIS WITH CONTRAST TECHNIQUE: Multidetector CT imaging of the chest was performed using the standard protocol during bolus administration of intravenous  contrast. Multiplanar CT image reconstructions and MIPs were obtained to evaluate the vascular anatomy. Multidetector CT imaging of the abdomen and pelvis was performed using the standard protocol during bolus administration of intravenous contrast. RADIATION DOSE REDUCTION: This exam was performed according to the departmental dose-optimization program which includes automated exposure control, adjustment of the mA and/or kV according to patient size and/or use of iterative reconstruction technique. CONTRAST:  56m OMNIPAQUE IOHEXOL 350 MG/ML SOLN COMPARISON:  CT 06/17/2022 FINDINGS: CTA CHEST FINDINGS Cardiovascular: Tiny nonocclusive thin filling defect within segmental branch of the RIGHT lobe pulmonary artery tree (image 253/series 4) is consistent with a small pulmonary embolism. No additional filling defects within the LEFT or RIGHT pulmonary arteries. Overall clot burden is very low unlikely symptomatic. Mediastinum/Nodes: No axillary or supraclavicular adenopathy. No mediastinal or hilar adenopathy. No pericardial fluid. Esophagus normal. Lungs/Pleura: No pulmonary infarction. No pneumonia. No pleural fluid. No pneumothorax Mild bibasilar atelectasis. Centrilobular emphysema the upper lobes. Musculoskeletal: No aggressive osseous lesion Review of the MIP images confirms the above findings. CT ABDOMEN and PELVIS FINDINGS Lower chest: Lung bases are clear. Hepatobiliary: Multiple small enhancing nodules throughout the LEFT and RIGHT hepatic lobe. There are no similar nodules on comparison contrast MRI (07/17/2022). Small amount pericholecystic fluid. Gallbladder nondistended. Common bile duct measures 8 mm similar to comparison  MRI. Pancreas: Pancreatic duct is prominent but not changed from comparison MRI. No pancreatic atrophy. Spleen: Normal spleen Adrenals/urinary tract: Adrenal glands and kidneys are normal. The ureters and bladder normal. Stomach/Bowel: Stomach, small bowel, appendix, and cecum are  normal. The colon and rectosigmoid colon are normal. Vascular/Lymphatic: Abdominal aorta is normal caliber with atherosclerotic calcification. There is no retroperitoneal or periportal lymphadenopathy. No pelvic lymphadenopathy. Reproductive: Prostate unremarkable Other: No free fluid. Musculoskeletal: No aggressive osseous lesion. Review of the MIP images confirms the above findings. IMPRESSION: CHEST IMPRESSION: 1. Small nonocclusive pulmonary embolism within a segmental branch of the RIGHT lower lobe pulmonary artery tree. Overall clot burden minimal/trace. 2. No pulmonary infarction or pneumonia. PELVIS IMPRESSION: 1. multiple small enhancing nodules throughout the LEFT and RIGHT hepatic lobe. No corresponding findings on recent abdominal MRI (07/17/2022). Consider hepatic ultrasound to exclude new nodularity. 1. Small amount pericholecystic fluid without gallbladder distension. Mild biliary duct dilatation. Recommend correlation with bilirubin evaluate for cholecystitis. 2. Stable dilatation of the pancreatic duct. 3. Aortic Atherosclerosis (ICD10-I70.0) and Emphysema (ICD10-J43.9). Findings conveyed toCHARLES JESSUP on 08/26/2022  at16:48. Electronically Signed   By: Suzy Bouchard M.D.   On: 08/26/2022 16:48   CT Abdomen Pelvis W Contrast  Result Date: 08/26/2022 CLINICAL DATA:  Short of breath. Concern for pulmonary embolism. Short of breath. Chest pain. Post upper endoscopy. EXAM: CT ANGIOGRAPHY CHEST CT ABDOMEN AND PELVIS WITH CONTRAST TECHNIQUE: Multidetector CT imaging of the chest was performed using the standard protocol during bolus administration of intravenous contrast. Multiplanar CT image reconstructions and MIPs were obtained to evaluate the vascular anatomy. Multidetector CT imaging of the abdomen and pelvis was performed using the standard protocol during bolus administration of intravenous contrast. RADIATION DOSE REDUCTION: This exam was performed according to the departmental  dose-optimization program which includes automated exposure control, adjustment of the mA and/or kV according to patient size and/or use of iterative reconstruction technique. CONTRAST:  22m OMNIPAQUE IOHEXOL 350 MG/ML SOLN COMPARISON:  CT 06/17/2022 FINDINGS: CTA CHEST FINDINGS Cardiovascular: Tiny nonocclusive thin filling defect within segmental branch of the RIGHT lobe pulmonary artery tree (image 253/series 4) is consistent with a small pulmonary embolism. No additional filling defects within the LEFT or RIGHT pulmonary arteries. Overall clot burden is very low unlikely symptomatic. Mediastinum/Nodes: No axillary or supraclavicular adenopathy. No mediastinal or hilar adenopathy. No pericardial fluid. Esophagus normal. Lungs/Pleura: No pulmonary infarction. No pneumonia. No pleural fluid. No pneumothorax Mild bibasilar atelectasis. Centrilobular emphysema the upper lobes. Musculoskeletal: No aggressive osseous lesion Review of the MIP images confirms the above findings. CT ABDOMEN and PELVIS FINDINGS Lower chest: Lung bases are clear. Hepatobiliary: Multiple small enhancing nodules throughout the LEFT and RIGHT hepatic lobe. There are no similar nodules on comparison contrast MRI (07/17/2022). Small amount pericholecystic fluid. Gallbladder nondistended. Common bile duct measures 8 mm similar to comparison MRI. Pancreas: Pancreatic duct is prominent but not changed from comparison MRI. No pancreatic atrophy. Spleen: Normal spleen Adrenals/urinary tract: Adrenal glands and kidneys are normal. The ureters and bladder normal. Stomach/Bowel: Stomach, small bowel, appendix, and cecum are normal. The colon and rectosigmoid colon are normal. Vascular/Lymphatic: Abdominal aorta is normal caliber with atherosclerotic calcification. There is no retroperitoneal or periportal lymphadenopathy. No pelvic lymphadenopathy. Reproductive: Prostate unremarkable Other: No free fluid. Musculoskeletal: No aggressive osseous lesion.  Review of the MIP images confirms the above findings. IMPRESSION: CHEST IMPRESSION: 1. Small nonocclusive pulmonary embolism within a segmental branch of the RIGHT lower lobe pulmonary artery tree. Overall clot burden minimal/trace. 2. No  pulmonary infarction or pneumonia. PELVIS IMPRESSION: 1. multiple small enhancing nodules throughout the LEFT and RIGHT hepatic lobe. No corresponding findings on recent abdominal MRI (07/17/2022). Consider hepatic ultrasound to exclude new nodularity. 1. Small amount pericholecystic fluid without gallbladder distension. Mild biliary duct dilatation. Recommend correlation with bilirubin evaluate for cholecystitis. 2. Stable dilatation of the pancreatic duct. 3. Aortic Atherosclerosis (ICD10-I70.0) and Emphysema (ICD10-J43.9). Findings conveyed toCHARLES JESSUP on 08/26/2022  at16:48. Electronically Signed   By: Suzy Bouchard M.D.   On: 08/26/2022 16:48   DG Chest Port 1 View  Result Date: 08/26/2022 CLINICAL DATA:  Shortness of breath EXAM: PORTABLE CHEST 1 VIEW COMPARISON:  05/16/2022, CT chest 06/17/2022 FINDINGS: No acute airspace disease or effusion. Mild emphysema. Stable cardiomediastinal silhouette with aortic atherosclerosis. No pneumothorax. IMPRESSION: No active disease. Emphysema. Electronically Signed   By: Donavan Foil M.D.   On: 08/26/2022 15:25   DG C-Arm 1-60 Min-No Report  Result Date: 08/25/2022 Fluoroscopy was utilized by the requesting physician.  No radiographic interpretation.      Subjective: Seen and examined on the day of discharge.  Stable no distress.  Appropriate for discharge home.  Discharge Exam: Vitals:   08/29/22 0413 08/29/22 0735  BP: 132/80 130/86  Pulse: 63 63  Resp: 18 18  Temp: 99.2 F (37.3 C) 98.2 F (36.8 C)  SpO2: 93% 94%   Vitals:   08/28/22 1520 08/28/22 1954 08/29/22 0413 08/29/22 0735  BP: 138/86 125/62 132/80 130/86  Pulse: 78 68 63 63  Resp: 18 18 18 18  $ Temp: 98.7 F (37.1 C) 98.6 F (37 C) 99.2 F  (37.3 C) 98.2 F (36.8 C)  TempSrc:  Oral  Oral  SpO2: 94% 96% 93% 94%  Weight:      Height:        General: Pt is alert, awake, not in acute distress Cardiovascular: RRR, S1/S2 +, no rubs, no gallops Respiratory: CTA bilaterally, no wheezing, no rhonchi Abdominal: Soft, NT, ND, bowel sounds + Extremities: no edema, no cyanosis    The results of significant diagnostics from this hospitalization (including imaging, microbiology, ancillary and laboratory) are listed below for reference.     Microbiology: Recent Results (from the past 240 hour(s))  Culture, blood (routine x 2)     Status: Abnormal   Collection Time: 08/26/22  3:08 PM   Specimen: BLOOD  Result Value Ref Range Status   Specimen Description   Final    BLOOD BLOOD LEFT ARM Performed at [facility], [address], [place], [place] [address]    Special Requests   Final    BOTTLES DRAWN AEROBIC AND ANAEROBIC BCAV Performed at [facility], [address] [place], [place] [address]    Culture  Setup Time   Final    GRAM NEGATIVE RODS IN BOTH AEROBIC AND ANAEROBIC BOTTLES CRITICAL VALUE NOTED.  VALUE IS CONSISTENT WITH PREVIOUSLY REPORTED AND CALLED VALUE. Performed at [facility], [address] [place], [place] [address]    Culture (A)  Final    ESCHERICHIA COLI SUSCEPTIBILITIES PERFORMED ON PREVIOUS CULTURE WITHIN THE LAST 5 DAYS. Performed at [facility], [place] [address] [place], [place] [address]    Report Status 08/29/2022 FINAL  Final  Culture, blood (routine x 2)     Status: Abnormal   Collection Time: 08/26/22  4:09 PM   Specimen: BLOOD  Result Value Ref Range Status   Specimen Description   Final    BLOOD LEFT ANTECUBITAL Performed  at [facility], [address], [place], [place] [address]    Special Requests   Final    BOTTLES DRAWN AEROBIC AND ANAEROBIC Blood Culture adequate volume Performed at [facility], [address]  [place], [place] [address]    Culture  Setup Time   Final    GRAM NEGATIVE RODS IN BOTH AEROBIC AND ANAEROBIC BOTTLES Organism ID to follow CRITICAL RESULT CALLED TO, READ BACK BY AND VERIFIED WITHPamelia Hoit PHARMD 1015 08/27/22 HNM Performed at [facility], [address] [place], [place] [address]    Culture ESCHERICHIA COLI (A)  Final   Report Status 08/29/2022 FINAL  Final   Organism ID, Bacteria ESCHERICHIA COLI  Final      Susceptibility   Escherichia coli - MIC*    AMPICILLIN <=2 SENSITIVE Sensitive     CEFAZOLIN <=4 SENSITIVE Sensitive     CEFEPIME <=0.12 SENSITIVE Sensitive     CEFTAZIDIME <=1 SENSITIVE Sensitive     CEFTRIAXONE <=0.25 SENSITIVE Sensitive     CIPROFLOXACIN <=0.25 SENSITIVE Sensitive     GENTAMICIN <=1 SENSITIVE Sensitive     IMIPENEM <=0.25 SENSITIVE Sensitive     TRIMETH/SULFA <=20 SENSITIVE Sensitive     AMPICILLIN/SULBACTAM <=2 SENSITIVE Sensitive     PIP/TAZO <=4 SENSITIVE Sensitive     * ESCHERICHIA COLI  Blood Culture ID Panel (Reflexed)     Status: Abnormal   Collection Time: 08/26/22  4:09 PM  Result Value Ref Range Status   Enterococcus faecalis NOT DETECTED NOT DETECTED Final   Enterococcus Faecium NOT DETECTED NOT DETECTED Final   Listeria monocytogenes NOT DETECTED NOT DETECTED Final   Staphylococcus species NOT DETECTED NOT DETECTED Final   Staphylococcus aureus (BCID) NOT DETECTED NOT DETECTED Final   Staphylococcus epidermidis NOT DETECTED NOT DETECTED Final   Staphylococcus lugdunensis NOT DETECTED NOT DETECTED Final   Streptococcus species NOT DETECTED NOT DETECTED Final   Streptococcus agalactiae NOT DETECTED NOT DETECTED Final   Streptococcus pneumoniae NOT DETECTED NOT DETECTED Final   Streptococcus pyogenes NOT DETECTED NOT DETECTED Final   A.calcoaceticus-baumannii NOT DETECTED NOT DETECTED Final   Bacteroides fragilis NOT DETECTED NOT DETECTED Final   Enterobacterales DETECTED (A) NOT DETECTED Final    Comment:  Enterobacterales represent a large order of gram negative bacteria, not a single organism. CRITICAL RESULT CALLED TO, READ BACK BY AND VERIFIED WITH: MORGAN GOBBLE PHARMD 1015 08/27/22 HNM    Enterobacter cloacae complex NOT DETECTED NOT DETECTED Final   Escherichia coli DETECTED (A) NOT DETECTED Final    Comment: CRITICAL RESULT CALLED TO, READ BACK BY AND VERIFIED WITH: MORGAN GOBBLE PHARMD 1015 08/27/22 HNM    Klebsiella aerogenes NOT DETECTED NOT DETECTED Final   Klebsiella oxytoca NOT DETECTED NOT DETECTED Final   Klebsiella pneumoniae NOT DETECTED NOT DETECTED Final   Proteus species NOT DETECTED NOT DETECTED Final   Salmonella species NOT DETECTED NOT DETECTED Final   Serratia marcescens NOT DETECTED NOT DETECTED Final   Haemophilus influenzae NOT DETECTED NOT DETECTED Final   Neisseria meningitidis NOT DETECTED NOT DETECTED Final   Pseudomonas aeruginosa NOT DETECTED NOT DETECTED Final   Stenotrophomonas maltophilia NOT DETECTED NOT DETECTED Final   Candida albicans NOT DETECTED NOT DETECTED Final   Candida auris NOT DETECTED NOT DETECTED Final   Candida glabrata NOT DETECTED NOT DETECTED Final   Candida krusei NOT DETECTED NOT DETECTED Final   Candida parapsilosis NOT DETECTED NOT DETECTED Final   Candida tropicalis NOT DETECTED NOT DETECTED Final  Cryptococcus neoformans/gattii NOT DETECTED NOT DETECTED Final   CTX-M ESBL NOT DETECTED NOT DETECTED Final   Carbapenem resistance IMP NOT DETECTED NOT DETECTED Final   Carbapenem resistance KPC NOT DETECTED NOT DETECTED Final   Carbapenem resistance NDM NOT DETECTED NOT DETECTED Final   Carbapenem resist OXA 48 LIKE NOT DETECTED NOT DETECTED Final   Carbapenem resistance VIM NOT DETECTED NOT DETECTED Final    Comment: Performed at [facility], [address] [place], [place] [address]     Labs: BNP (last 3 results) No results for input(s): "BNP" in the last 8760 hours. Basic Metabolic Panel: Recent Labs  Lab  08/26/22 1501 08/27/22 0832 08/27/22 2122 08/28/22 0719 08/29/22 0333  NA 127* 133* 128* 130* 127*  K 4.8 4.3 4.0 3.9 3.9  CL 96* 104 99 103 98  CO2 22 22 21* 20* 22  GLUCOSE 112* 97 102* 90 91  BUN 29* 22 17 16 16  $ CREATININE 1.74* 1.24 0.98 1.01 0.98  CALCIUM 8.5* 8.2* 8.2* 7.7* 8.1*  MG 1.9  --   --   --   --    Liver Function Tests: Recent Labs  Lab 08/26/22 1501 08/27/22 0832 08/27/22 2122 08/28/22 0719 08/29/22 0333  AST 211* 99* 71* 49* 32  ALT 267* 164* 136* 109* 78*  ALKPHOS 117 106 106 96 99  BILITOT 2.5* 1.2 1.2 1.1 0.9  PROT 6.6 5.8* 6.4* 5.9* 6.1*  ALBUMIN 3.3* 2.9* 3.0* 2.8* 2.7*   Recent Labs  Lab 08/26/22 1501 08/27/22 0832  LIPASE 121* 53*   Recent Labs  Lab 08/27/22 2122  AMMONIA 16   CBC: Recent Labs  Lab 08/26/22 1501 08/27/22 0832 08/28/22 0719 08/29/22 0333  WBC 25.0* 18.9* 8.9 6.7  NEUTROABS 21.7*  --   --   --   HGB 13.5 12.9* 11.9* 11.7*  HCT 40.2 38.7* 35.1* 34.0*  MCV 90.3 91.3 89.1 86.7  PLT 152 132* 103* 99*   Cardiac Enzymes: No results for input(s): "CKTOTAL", "CKMB", "CKMBINDEX", "TROPONINI" in the last 168 hours. BNP: Invalid input(s): "POCBNP" CBG: Recent Labs  Lab 08/27/22 2035 08/28/22 0813  GLUCAP 111* 97   D-Dimer No results for input(s): "DDIMER" in the last 72 hours. Hgb A1c No results for input(s): "HGBA1C" in the last 72 hours. Lipid Profile No results for input(s): "CHOL", "HDL", "LDLCALC", "TRIG", "CHOLHDL", "LDLDIRECT" in the last 72 hours. Thyroid function studies No results for input(s): "TSH", "T4TOTAL", "T3FREE", "THYROIDAB" in the last 72 hours.  Invalid input(s): "FREET3" Anemia work up No results for input(s): "VITAMINB12", "FOLATE", "FERRITIN", "TIBC", "IRON", "RETICCTPCT" in the last 72 hours. Urinalysis    Component Value Date/Time   COLORURINE AMBER (A) 08/26/2022 1928   APPEARANCEUR HAZY (A) 08/26/2022 1928   LABSPEC 1.031 (H) 08/26/2022 1928   PHURINE 5.0 08/26/2022 1928    GLUCOSEU NEGATIVE 08/26/2022 1928   HGBUR SMALL (A) 08/26/2022 1928   BILIRUBINUR NEGATIVE 08/26/2022 1928   KETONESUR NEGATIVE 08/26/2022 1928   PROTEINUR NEGATIVE 08/26/2022 1928   NITRITE NEGATIVE 08/26/2022 1928   LEUKOCYTESUR NEGATIVE 08/26/2022 1928   Sepsis Labs Recent Labs  Lab 08/26/22 1501 08/27/22 0832 08/28/22 0719 08/29/22 0333  WBC 25.0* 18.9* 8.9 6.7   Microbiology Recent Results (from the past 240 hour(s))  Culture, blood (routine x 2)     Status: Abnormal   Collection Time: 08/26/22  3:08 PM   Specimen: BLOOD  Result Value Ref Range Status   Specimen Description   Final    BLOOD  BLOOD LEFT ARM Performed at [facility], [address], [place], [place] [address]    Special Requests   Final    BOTTLES DRAWN AEROBIC AND ANAEROBIC BCAV Performed at [facility], [address] [place], [place] [address]    Culture  Setup Time   Final    GRAM NEGATIVE RODS IN BOTH AEROBIC AND ANAEROBIC BOTTLES CRITICAL VALUE NOTED.  VALUE IS CONSISTENT WITH PREVIOUSLY REPORTED AND CALLED VALUE. Performed at [facility], [address] [place], [place] [address]    Culture (A)  Final    ESCHERICHIA COLI SUSCEPTIBILITIES PERFORMED ON PREVIOUS CULTURE WITHIN THE LAST 5 DAYS. Performed at [facility], [place] [address], [place], [place] [address]    Report Status 08/29/2022 FINAL  Final  Culture, blood (routine x 2)     Status: Abnormal   Collection Time: 08/26/22  4:09 PM   Specimen: BLOOD  Result Value Ref Range Status   Specimen Description   Final    BLOOD LEFT ANTECUBITAL Performed at [facility], [address] [place], [place] [address]    Special Requests   Final    BOTTLES DRAWN AEROBIC AND ANAEROBIC Blood Culture adequate volume Performed at [facility], [address], [place], [place] [address]    Culture  Setup Time   Final    GRAM NEGATIVE RODS IN BOTH AEROBIC AND ANAEROBIC BOTTLES Organism ID  to follow CRITICAL RESULT CALLED TO, READ BACK BY AND VERIFIED WITHPamelia Hoit PHARMD 1015 08/27/22 HNM Performed at [facility], [address] [place], [place] [address]    Culture ESCHERICHIA COLI (A)  Final   Report Status 08/29/2022 FINAL  Final   Organism ID, Bacteria ESCHERICHIA COLI  Final      Susceptibility   Escherichia coli - MIC*    AMPICILLIN <=2 SENSITIVE Sensitive     CEFAZOLIN <=4 SENSITIVE Sensitive     CEFEPIME <=0.12 SENSITIVE Sensitive     CEFTAZIDIME <=1 SENSITIVE Sensitive     CEFTRIAXONE <=0.25 SENSITIVE Sensitive     CIPROFLOXACIN <=0.25 SENSITIVE Sensitive     GENTAMICIN <=1 SENSITIVE Sensitive     IMIPENEM <=0.25 SENSITIVE Sensitive     TRIMETH/SULFA <=20 SENSITIVE Sensitive     AMPICILLIN/SULBACTAM <=2 SENSITIVE Sensitive     PIP/TAZO <=4 SENSITIVE Sensitive     * ESCHERICHIA COLI  Blood Culture ID Panel (Reflexed)     Status: Abnormal   Collection Time: 08/26/22  4:09 PM  Result Value Ref Range Status   Enterococcus faecalis NOT DETECTED NOT DETECTED Final   Enterococcus Faecium NOT DETECTED NOT DETECTED Final   Listeria monocytogenes NOT DETECTED NOT DETECTED Final   Staphylococcus species NOT DETECTED NOT DETECTED Final   Staphylococcus aureus (BCID) NOT DETECTED NOT DETECTED Final   Staphylococcus epidermidis NOT DETECTED NOT DETECTED Final   Staphylococcus lugdunensis NOT DETECTED NOT DETECTED Final   Streptococcus species NOT DETECTED NOT DETECTED Final   Streptococcus agalactiae NOT DETECTED NOT DETECTED Final   Streptococcus pneumoniae NOT DETECTED NOT DETECTED Final   Streptococcus pyogenes NOT DETECTED NOT DETECTED Final   A.calcoaceticus-baumannii NOT DETECTED NOT DETECTED Final   Bacteroides fragilis NOT DETECTED NOT DETECTED Final   Enterobacterales DETECTED (A) NOT DETECTED Final    Comment: Enterobacterales represent a large order of gram negative bacteria, not a single organism. CRITICAL RESULT CALLED TO, READ BACK BY  AND VERIFIED WITH: Pamelia Hoit PHARMD 1015 08/27/22 HNM    Enterobacter cloacae complex  NOT DETECTED NOT DETECTED Final   Escherichia coli DETECTED (A) NOT DETECTED Final    Comment: CRITICAL RESULT CALLED TO, READ BACK BY AND VERIFIED WITH: MORGAN GOBBLE PHARMD 1015 08/27/22 HNM    Klebsiella aerogenes NOT DETECTED NOT DETECTED Final   Klebsiella oxytoca NOT DETECTED NOT DETECTED Final   Klebsiella pneumoniae NOT DETECTED NOT DETECTED Final   Proteus species NOT DETECTED NOT DETECTED Final   Salmonella species NOT DETECTED NOT DETECTED Final   Serratia marcescens NOT DETECTED NOT DETECTED Final   Haemophilus influenzae NOT DETECTED NOT DETECTED Final   Neisseria meningitidis NOT DETECTED NOT DETECTED Final   Pseudomonas aeruginosa NOT DETECTED NOT DETECTED Final   Stenotrophomonas maltophilia NOT DETECTED NOT DETECTED Final   Candida albicans NOT DETECTED NOT DETECTED Final   Candida auris NOT DETECTED NOT DETECTED Final   Candida glabrata NOT DETECTED NOT DETECTED Final   Candida krusei NOT DETECTED NOT DETECTED Final   Candida parapsilosis NOT DETECTED NOT DETECTED Final   Candida tropicalis NOT DETECTED NOT DETECTED Final   Cryptococcus neoformans/gattii NOT DETECTED NOT DETECTED Final   CTX-M ESBL NOT DETECTED NOT DETECTED Final   Carbapenem resistance IMP NOT DETECTED NOT DETECTED Final   Carbapenem resistance KPC NOT DETECTED NOT DETECTED Final   Carbapenem resistance NDM NOT DETECTED NOT DETECTED Final   Carbapenem resist OXA 48 LIKE NOT DETECTED NOT DETECTED Final   Carbapenem resistance VIM NOT DETECTED NOT DETECTED Final    Comment: Performed at [facility], [address] [place], [place] [address]     Time coordinating discharge: Over 30 minutes  SIGNED:   [name] Ace, MD  [facility] 08/29/2022, 10:49 AM Pager   If 7PM-7AM, please contact night-coverage

## 2022-09-01 ENCOUNTER — Telehealth: Payer: Self-pay

## 2022-09-01 NOTE — Telephone Encounter (Signed)
Pt's wife, Guerry Minors, called stating that the pt was d/c'd from [facility] on Saturday. During his hospitalization, there was an incidental finding of a PE and he was started on Eliquis. She called to request that Dr. Trula Slade manage the Eliquis.  Reviewed pt's chart, returned call for clarification, two identifiers used. Informed her that the pt's PCP would need to manage the Eliquis. Pt has an appt with his PCP soon. She will call to verify it's within the next 20 days so he doesn't run out of med. Confirmed understanding.

## 2022-09-02 ENCOUNTER — Ambulatory Visit (INDEPENDENT_AMBULATORY_CARE_PROVIDER_SITE_OTHER): Payer: Medicare Other | Admitting: Internal Medicine

## 2022-09-02 ENCOUNTER — Encounter: Payer: Self-pay | Admitting: Internal Medicine

## 2022-09-02 VITALS — BP 122/72 | HR 77 | Temp 97.8°F | Ht 66.0 in | Wt 156.0 lb

## 2022-09-02 DIAGNOSIS — G4733 Obstructive sleep apnea (adult) (pediatric): Secondary | ICD-10-CM | POA: Diagnosis not present

## 2022-09-02 DIAGNOSIS — R42 Dizziness and giddiness: Secondary | ICD-10-CM | POA: Diagnosis not present

## 2022-09-02 DIAGNOSIS — I2510 Atherosclerotic heart disease of native coronary artery without angina pectoris: Secondary | ICD-10-CM | POA: Diagnosis not present

## 2022-09-02 DIAGNOSIS — I2609 Other pulmonary embolism with acute cor pulmonale: Secondary | ICD-10-CM

## 2022-09-02 DIAGNOSIS — I5030 Unspecified diastolic (congestive) heart failure: Secondary | ICD-10-CM

## 2022-09-02 NOTE — Patient Instructions (Signed)
A+ keep up the great work! Continue CPAP as prescribed Referral for mask fitting assessment  Referral to Cardiology Referral to Neurology  [place] Avoid secondhand smoke Avoid SICK contacts Recommend  Masking  when appropriate Recommend Keep up-to-date with vaccinations

## 2022-09-02 NOTE — Progress Notes (Signed)
Name: Brian [name] MRN: [id number] DOB: May 24, 1941    CHIEF COMPLAINT:  EXCESSIVE DAYTIME SLEEPINESS   HISTORY OF PRESENT ILLNESS: Patient is seen today for problems and issues with OSA  Discussed sleep data and reviewed with patient.  Patient diagnosed with obstructive sleep apnea 10 years ago CPAP compliance report reviewed in detail with the patient Patient has excellent compliance 100% for days and greater than 4 hours AHI reduced to 3.7 Patient is having hard time with mask fitting and will be referred to [place] [facility] for desensitization  Patient was admitted to the ICU last week for E. coli sepsis with metabolic encephalopathy with acute liver failure patient underwent ERCP for gallbladder disease found to have sludge  Patient was discharge for 5 days ago There is residual imbalance Patient does have some shortness of breath and dyspnea on exertion Patient is a longtime smoker 1 pack a day for the last 70 years Patient quit tobacco abuse last week  Patient had a history of coronary artery disease with 2 stents placed Previously had been seeing Dr. Tamala Julian cardiology in [place]   Patient also on oral anticoagulation for diagnosis of PE  I have relayed to the patient and daughter that patient was critically ill however has recovered well at this time, education provided to patient  PAST MEDICAL HISTORY :   has a past medical history of Adenomatous polyp, Carpal tunnel syndrome on left, Cervical spine disease, Coronary artery disease, Hyperlipidemia, Hypertension, Ischemic cardiomyopathy (05/19/2022), Low back pain, Myocardial infarction ([place]) (1998), Sleep apnea, and Wears dentures.  has a past surgical history that includes Appendectomy; Hernia repair; Hemorrhoid banding; bunions; lumbar disc protrussion (03/2010); Cataract extraction; Colonoscopy with propofol (N/A, 02/19/2015); Colonoscopy with propofol (N/A, 06/05/2019); polypectomy (N/A, 06/05/2019); and  ERCP (N/A, 08/25/2022). Prior to Admission medications   Medication Sig Start Date End Date Taking? Authorizing Provider  apixaban (ELIQUIS) 5 MG TABS tablet Take 2 tablets (10 mg total) by mouth 2 (two) times daily for 7 days, THEN 1 tablet (5 mg total) 2 (two) times daily. 08/29/22 11/27/22  [name] Ace, MD  aspirin 81 MG tablet Take 81 mg by mouth at bedtime.    [provider]  atorvastatin (LIPITOR) 40 MG tablet Take 40 mg by mouth at bedtime.    [provider]  benzonatate (TESSALON) 200 MG capsule Take 1 capsule (200 mg total) by mouth 3 (three) times daily as needed for cough. 08/29/22   [name] Ace, MD  ciprofloxacin (CIPRO) 500 MG tablet Take 1 tablet (500 mg total) by mouth 2 (two) times daily for 11 days. 08/29/22 09/09/22  [name] Ace, MD  Coenzyme Q10 (COQ10) 100 MG CAPS Take 1 capsule by mouth daily.      [provider]  latanoprost (XALATAN) 0.005 % ophthalmic solution Place 1 drop into the right eye at bedtime. 03/04/22   [provider]  lisinopril (ZESTRIL) 20 MG tablet Take 1 tablet (20 mg total) by mouth daily. Patient taking differently: Take 20 mg by mouth at bedtime. 07/22/22   Belva Crome, MD  meclizine (ANTIVERT) 25 MG tablet Take 1 tablet (25 mg total) by mouth 2 (two) times daily as needed for dizziness. 08/29/22   [name] Ace, MD  metoprolol succinate (TOPROL-XL) 25 MG 24 hr tablet TAKE 1 TABLET(25 MG) BY MOUTH DAILY Patient taking differently: Take 25 mg by mouth at bedtime. 08/18/22   Richardson Dopp T, PA-C  Multiple Vitamin (MULTIVITAMIN PO) Take 1  tablet by mouth daily.    [provider]  nitroGLYCERIN (NITROSTAT) 0.4 MG SL tablet Place 0.4 mg under the tongue every 5 (five) minutes as needed for chest pain.    [provider]  Oxycodone HCl 10 MG TABS Take 10-20 mg by mouth every 6 (six) hours as needed (moderate to severe pain).    [provider]   No Known  Allergies  FAMILY HISTORY:  family history includes Coronary artery disease in his brother; Heart failure in his mother; Hypertension in his father; Kidney disease in his father. SOCIAL HISTORY:  reports that he has been smoking cigarettes. He has a 30.00 pack-year smoking history. He has never used smokeless tobacco. He reports that he does not currently use alcohol. He reports that he does not use drugs.   Review of Systems:  Gen:  Denies  fever, sweats, chills weight loss  HEENT: Denies blurred vision, double vision, ear pain, eye pain, hearing loss, nose bleeds, sore throat Cardiac:  No dizziness, chest pain or heaviness, chest tightness,edema, No JVD Resp:   No cough, -sputum production, -shortness of breath,-wheezing, -hemoptysis,  Gi: Denies swallowing difficulty, stomach pain, nausea or vomiting, diarrhea, constipation, bowel incontinence Gu:  Denies bladder incontinence, burning urine Ext:   Denies Joint pain, stiffness or swelling Skin: Denies  skin rash, easy bruising or bleeding or hives Endoc:  Denies polyuria, polydipsia , polyphagia or weight change Psych:   Denies depression, insomnia or hallucinations  Other:  All other systems negative   ALL OTHER ROS ARE NEGATIVE   BP 122/72 (BP Location: Left Arm, Cuff Size: Normal)   Pulse 77   Temp 97.8 F (36.6 C) (Temporal)   Ht 5' 6"$  (1.676 m)   Wt 156 lb (70.8 kg)   SpO2 95%   BMI 25.18 kg/m     Physical Examination:   General Appearance: No distress  EYES PERRLA, EOM intact.   NECK Supple, No JVD Pulmonary: normal breath sounds, No wheezing.  CardiovascularNormal S1,S2.  No m/r/g.   Abdomen: Benign, Soft, non-tender. Skin:   warm, no rashes, no ecchymosis  Extremities: normal, no cyanosis, clubbing. Neuro:without focal findings,  speech normal  PSYCHIATRIC: Mood, affect within normal limits.   ALL OTHER ROS ARE NEGATIVE    ASSESSMENT AND PLAN SYNOPSIS 82 year old pleasant white male seen today for  multiple medical issues including a diagnosis of obstructive sleep apnea going back 10 years ago with a history of coronary artery disease myocardial infarction with 2 stents placed in the setting of a recent ICU admission for E. coli sepsis and metabolic encephalopathy with a diagnosis of pulmonary embolism, with a longstanding history of smoking and tobacco abuse, overall medic condition has improved patient has residual side effects of shortness of breath and imbalance   Pulmonary embolism Continue anticoagulation for the next 6 months  History of coronary artery disease Recommend cardiology referral  CT of the head shows white matter disease Patient with imbalance issues Recommend referral to neurology  OSA Compliance report reviewed in detail with the patient Patient has excellent compliance CPAP of 7 cm water pressure AHI reduced 3.7 Mask fitting desensitization program referral  Deconditioned state -Recommend increased daily activity and exercise   MEDICATION ADJUSTMENTS/LABS AND TESTS ORDERED: Referral for mask fitting assessment  Referral to Cardiology Referral to Neurology  PLEASE CONTINUE TO STOP SMOKING Avoid secondhand smoke Avoid SICK contacts Recommend  Masking  when appropriate Recommend Keep up-to-date with vaccinations   CURRENT MEDICATIONS REVIEWED AT LENGTH WITH  PATIENT TODAY   Patient  satisfied with Plan of action and management. All questions answered  Follow up  6 months  Total Time Spent  45 mins   Maretta Bees Patricia Pesa, M.D.  [facility]  Medical Director [place] Director [facility] Cardio-Pulmonary Department

## 2022-09-03 DIAGNOSIS — G4733 Obstructive sleep apnea (adult) (pediatric): Secondary | ICD-10-CM | POA: Diagnosis not present

## 2022-09-04 DIAGNOSIS — H3582 Retinal ischemia: Secondary | ICD-10-CM | POA: Diagnosis not present

## 2022-09-04 DIAGNOSIS — H3561 Retinal hemorrhage, right eye: Secondary | ICD-10-CM | POA: Diagnosis not present

## 2022-09-09 DIAGNOSIS — I2699 Other pulmonary embolism without acute cor pulmonale: Secondary | ICD-10-CM | POA: Diagnosis not present

## 2022-09-09 DIAGNOSIS — H539 Unspecified visual disturbance: Secondary | ICD-10-CM | POA: Diagnosis not present

## 2022-09-09 DIAGNOSIS — A419 Sepsis, unspecified organism: Secondary | ICD-10-CM | POA: Diagnosis not present

## 2022-09-09 DIAGNOSIS — K8309 Other cholangitis: Secondary | ICD-10-CM | POA: Diagnosis not present

## 2022-09-09 DIAGNOSIS — R7881 Bacteremia: Secondary | ICD-10-CM | POA: Diagnosis not present

## 2022-09-09 LAB — HEPATIC FUNCTION PANEL
ALT: 21 U/L (ref 10–40)
AST: 22 (ref 14–40)
Alkaline Phosphatase: 117 (ref 25–125)
Bilirubin, Total: 0.5

## 2022-09-09 LAB — TSH: TSH: 2.36 (ref 0.41–5.90)

## 2022-09-09 LAB — CBC AND DIFFERENTIAL
HCT: 40 — AB (ref 41–53)
Hemoglobin: 13.4 — AB (ref 13.5–17.5)
Platelets: 446 10*3/uL — AB (ref 150–400)
WBC: 8.7

## 2022-09-09 LAB — CBC: RBC: 4.49 (ref 3.87–5.11)

## 2022-09-09 LAB — VITAMIN D 25 HYDROXY (VIT D DEFICIENCY, FRACTURES): Vit D, 25-Hydroxy: 29.7

## 2022-09-09 LAB — BASIC METABOLIC PANEL
BUN: 29 — AB (ref 4–21)
CO2: 26 — AB (ref 13–22)
Chloride: 100 (ref 99–108)
Creatinine: 1 (ref 0.6–1.3)
Glucose: 74
Potassium: 5.2 mEq/L — AB (ref 3.5–5.1)
Sodium: 134 — AB (ref 137–147)

## 2022-09-09 LAB — COMPREHENSIVE METABOLIC PANEL
Albumin: 4 (ref 3.5–5.0)
Calcium: 9.8 (ref 8.7–10.7)
eGFR: 72

## 2022-09-10 ENCOUNTER — Ambulatory Visit
Admission: EM | Admit: 2022-09-10 | Discharge: 2022-09-10 | Disposition: A | Discharge status: Home / Self Care | Payer: Medicare Other | Attending: Physician Assistant | Admitting: Physician Assistant

## 2022-09-10 ENCOUNTER — Ambulatory Visit (INDEPENDENT_AMBULATORY_CARE_PROVIDER_SITE_OTHER): Payer: Medicare Other

## 2022-09-10 ENCOUNTER — Encounter: Payer: Self-pay | Admitting: Emergency Medicine

## 2022-09-10 DIAGNOSIS — S93602A Unspecified sprain of left foot, initial encounter: Secondary | ICD-10-CM

## 2022-09-10 DIAGNOSIS — I959 Hypotension, unspecified: Secondary | ICD-10-CM | POA: Diagnosis not present

## 2022-09-10 DIAGNOSIS — M79672 Pain in left foot: Secondary | ICD-10-CM | POA: Diagnosis not present

## 2022-09-10 DIAGNOSIS — M25572 Pain in left ankle and joints of left foot: Secondary | ICD-10-CM | POA: Diagnosis not present

## 2022-09-10 DIAGNOSIS — Z79899 Other long term (current) drug therapy: Secondary | ICD-10-CM

## 2022-09-10 NOTE — ED Provider Notes (Signed)
[facility]    CSN: [id number] Arrival date & time: 09/10/22  1117      History   Chief Complaint Chief Complaint  Patient presents with   Fall    HPI Brian [name] is a 82 y.o. male presenting with his daughter for left ankle and foot pain since yesterday.  He reports that he was sitting in a chair for period of time with his left leg crossed over his right.  He says that he started to feel some numbness like his leg was going asleep.  He reports that he uncross his leg and went to stand up but ended up falling onto all fours.  He denies head injury.  He states that he thinks he twisted his foot and ankle in the process.  He reports that he is able to bear weight but states it hurts to do so.  He denies any swelling, wounds, bruising.  Of note, blood pressure is 89/64.  He was seen by PCP yesterday and reportedly had normal blood pressure and normal blood work.  I am unable to see this in care everywhere.  He was admitted recently for sepsis, cholangitis and pulmonary embolism.  He also had a surgery on his right eye due to ocular ischemia.  Surgery was about 6 days ago.  Patient says he feels well at this time other than his foot and ankle pain.  He is denying any fever, fatigue, chest pain, palpitations, dizziness, shortness of breath, abdominal pain, nausea/vomiting.  Denies any headaches, vision changes.  Past medical history is significant for coronary artery disease, hypertension, hyperlipidemia, ischemic cardiomyopathy, MI, sleep apnea.  HPI  Past Medical History:  Diagnosis Date   Adenomatous polyp    Carpal tunnel syndrome on left    Cervical spine disease    Coronary artery disease    S/p anterior MI tx with PCI in 1998  //  Myoview 6/22: EF 45, ant and septal scar; no ischemia; intermediate risk // Echocardiogram 7/22: EF 45-50, ant-sept and apical HK, Gr 1 DD, normal RVSF, mild MR   Hyperlipidemia    Hypertension    Ischemic cardiomyopathy 05/19/2022    Echocardiogram 06/02/22: EF 45-50, global HK, Gr 1 DD, NL RVSF, NL PASP (RVSP 26.4), trivial MR, RAP 3   Low back pain    Myocardial infarction ([place]) 1998   Sleep apnea    CPAP   Wears dentures    full upper    Patient Active Problem List   Diagnosis Date Noted   Cholangitis 08/26/2022   Choledocholithiasis 08/25/2022   Stenosis of left subclavian artery ([place]) 07/01/2022   Ascending aortic aneurysm ([place]) 07/01/2022   Dizziness 05/19/2022   Shortness of breath 05/19/2022   Ischemic cardiomyopathy 05/19/2022   Hematochezia    Polyp of ascending colon    Polyp of sigmoid colon    Prostate nodule 05/22/2019   Other chronic pain 05/22/2019   Pulmonary nodule 05/22/2019   Other long term (current) drug therapy 05/22/2019   Nicotine dependence, unspecified, uncomplicated [id number]   Sleep apnea    Low back pain    Hypertension    Coronary artery disease involving native coronary artery of native heart with angina pectoris (HCC)    Cervical spine disease    Carpal tunnel syndrome on left    Adenomatous polyp    BRBPR (bright red blood per rectum) 02/14/2016   Aortic calcification ([place]) 05/08/2015   Enteritis due to Clostridium difficile 02/21/2015   Ischemic bowel  disease ([place])    Hyperlipidemia 02/05/2010   TOBACCO USER 02/05/2010   Hypersomnia with sleep apnea 02/05/2010   Myocardial infarction ([place]) 07/20/1996    Past Surgical History:  Procedure Laterality Date   APPENDECTOMY     bunions     CATARACT EXTRACTION     COLONOSCOPY WITH PROPOFOL N/A 02/19/2015   Procedure: COLONOSCOPY WITH PROPOFOL;  Surgeon: Lucilla Lame, MD;  Location: [facility] ENDOSCOPY;  Service: Endoscopy;  Laterality: N/A;   COLONOSCOPY WITH PROPOFOL N/A 06/05/2019   Procedure: COLONOSCOPY WITH BIOPSY;  Surgeon: Lucilla Lame, MD;  Location: [place] [facility];  Service: Endoscopy;  Laterality: N/A;  sleep apnea   ERCP N/A 08/25/2022   Procedure: ENDOSCOPIC RETROGRADE CHOLANGIOPANCREATOGRAPHY (ERCP);   Surgeon: Lucilla Lame, MD;  Location: [facility] ENDOSCOPY;  Service: Endoscopy;  Laterality: N/A;   HEMORRHOID BANDING     HERNIA REPAIR     lumbar disc protrussion  03/2010   POLYPECTOMY N/A 06/05/2019   Procedure: POLYPECTOMY;  Surgeon: Lucilla Lame, MD;  Location: [place];  Service: Endoscopy;  Laterality: N/A;       Home Medications    Prior to Admission medications   Medication Sig Start Date End Date Taking? Authorizing Provider  apixaban (ELIQUIS) 5 MG TABS tablet Take 2 tablets (10 mg total) by mouth 2 (two) times daily for 7 days, THEN 1 tablet (5 mg total) 2 (two) times daily. 08/29/22 11/27/22  [name] Ace, MD  aspirin 81 MG tablet Take 81 mg by mouth at bedtime.    [provider]  atorvastatin (LIPITOR) 40 MG tablet Take 40 mg by mouth at bedtime.    [provider]  benzonatate (TESSALON) 200 MG capsule Take 1 capsule (200 mg total) by mouth 3 (three) times daily as needed for cough. 08/29/22   Sreenath, Trula Slade, MD  Coenzyme Q10 (COQ10) 100 MG CAPS Take 1 capsule by mouth daily.      [provider]  latanoprost (XALATAN) 0.005 % ophthalmic solution Place 1 drop into the right eye at bedtime. 03/04/22   [provider]  lisinopril (ZESTRIL) 20 MG tablet Take 1 tablet (20 mg total) by mouth daily. Patient taking differently: Take 20 mg by mouth at bedtime. 07/22/22   Belva Crome, MD  meclizine (ANTIVERT) 25 MG tablet Take 1 tablet (25 mg total) by mouth 2 (two) times daily as needed for dizziness. 08/29/22   [name] Ace, MD  metoprolol succinate (TOPROL-XL) 25 MG 24 hr tablet TAKE 1 TABLET(25 MG) BY MOUTH DAILY Patient taking differently: Take 25 mg by mouth at bedtime. 08/18/22   Richardson Dopp T, PA-C  Multiple Vitamin (MULTIVITAMIN PO) Take 1 tablet by mouth daily.    [provider]  nitroGLYCERIN (NITROSTAT) 0.4 MG SL tablet Place 0.4 mg under the tongue every 5 (five) minutes as needed for chest pain.     [provider]  Oxycodone HCl 10 MG TABS Take 10-20 mg by mouth every 6 (six) hours as needed (moderate to severe pain).    [provider]    Family History Family History  Problem Relation Age of Onset   Kidney disease Father    Hypertension Father    Heart failure Mother    Coronary artery disease Brother     Social History Social History   Tobacco Use   Smoking status: Former    Packs/day: 0.50    Years: 60.00    Total pack years: 30.00    Types: Cigarettes  Smokeless tobacco: Never   Tobacco comments:    1/2 3/4 ppd (since age 64)  Vaping Use   Vaping Use: Never used  Substance Use Topics   Alcohol use: Not Currently    Alcohol/week: 0.0 standard drinks of alcohol    Comment: occasionally   Drug use: No     Allergies   Patient has no known allergies.   Review of Systems Review of Systems  Constitutional:  Negative for fatigue and fever.  HENT:  Negative for congestion.   Eyes:  Negative for photophobia and visual disturbance.  Respiratory:  Negative for cough and shortness of breath.   Cardiovascular:  Negative for chest pain and palpitations.  Gastrointestinal:  Negative for abdominal pain, nausea and vomiting.  Musculoskeletal:  Positive for arthralgias. Negative for gait problem and joint swelling.  Skin:  Negative for color change and wound.  Neurological:  Negative for dizziness, syncope, weakness, numbness and headaches.     Physical Exam Triage Vital Signs ED Triage Vitals  Enc Vitals Group     BP 09/10/22 1155 (!) 89/64     Pulse Rate 09/10/22 1153 64     Resp 09/10/22 1153 16     Temp 09/10/22 1153 97.8 F (36.6 C)     Temp Source 09/10/22 1153 Oral     SpO2 09/10/22 1153 95 %     Weight --      Height --      Head Circumference --      Peak Flow --      Pain Score 09/10/22 1157 0     Pain Loc --      Pain Edu? --      Excl. in [place]? --    No data found.  Updated Vital Signs BP (!) 89/64 (BP Location: Right  Arm)   Pulse 64   Temp 97.8 F (36.6 C) (Oral)   Resp 16   SpO2 95%       Physical Exam Vitals and nursing note reviewed.  Constitutional:      General: He is not in acute distress.    Appearance: Normal appearance. He is well-developed. He is not ill-appearing.  HENT:     Head: Normocephalic and atraumatic.     Nose: Nose normal.     Mouth/Throat:     Mouth: Mucous membranes are moist.     Pharynx: Oropharynx is clear.  Eyes:     General: No scleral icterus.    Extraocular Movements: Extraocular movements intact.     Conjunctiva/sclera: Conjunctivae normal.     Pupils: Pupils are equal, round, and reactive to light.  Cardiovascular:     Rate and Rhythm: Normal rate and regular rhythm.     Heart sounds: Normal heart sounds.  Pulmonary:     Effort: Pulmonary effort is normal. No respiratory distress.     Breath sounds: Normal breath sounds.  Abdominal:     Palpations: Abdomen is soft.     Tenderness: There is no abdominal tenderness.  Musculoskeletal:     Cervical back: Neck supple.     Left ankle: No swelling. Tenderness present over the lateral malleolus. Normal range of motion.     Left foot: Normal range of motion. Tenderness (TTP metatarsals 3-5) present. No swelling.  Skin:    General: Skin is warm and dry.     Capillary Refill: Capillary refill takes less than 2 seconds.  Neurological:     General: No focal deficit present.  Mental Status: He is alert and oriented to person, place, and time. Mental status is at baseline.     Cranial Nerves: No cranial nerve deficit.     Motor: No weakness.     Coordination: Coordination normal.     Gait: Gait normal.  Psychiatric:        Mood and Affect: Mood normal.        Behavior: Behavior normal.      UC Treatments / Results  Labs (all labs ordered are listed, but only abnormal results are displayed) Labs Reviewed - No data to display  EKG   Radiology DG Foot Complete Left  Result Date:  09/10/2022 CLINICAL DATA:  Fall. Lateral ankle and foot pain. Dorsal foot pain. EXAM: LEFT ANKLE COMPLETE - 3+ VIEW; LEFT FOOT - COMPLETE 3+ VIEW COMPARISON:  None Available. FINDINGS: Left ankle: The ankle mortise is symmetric and intact. Minimal distal medial malleolar degenerative spurring. Joint spaces are preserved. No acute fracture or dislocation. Mild-to-moderate vascular calcifications. -- Left foot: Moderate hallux valgus. Severe great toe metatarsophalangeal joint space narrowing with diffuse subchondral sclerosis, metatarsal head subchondral cystic changes, and peripheral osteophytes greatest laterally. Postsurgical changes of the proximal phalanx of the great toe with likely prior distal shaft osteotomy and overlying cerclage wiring. Postsurgical changes of the distal second metatarsal, likely an osteotomy with overlying screw. There is arthrodesis of the second toe PIP joint. Moderate interphalangeal joint space narrowing diffusely. No acute fracture or dislocation. IMPRESSION: 1. No acute fracture. 2. Moderate hallux valgus with severe great toe metatarsophalangeal joint osteoarthritis. 3. Postsurgical changes of the proximal phalanx of the great toe and distal second metatarsal. Electronically Signed   By: Yvonne Kendall M.D.   On: 09/10/2022 13:03   DG Ankle Complete Left  Result Date: 09/10/2022 CLINICAL DATA:  Fall. Lateral ankle and foot pain. Dorsal foot pain. EXAM: LEFT ANKLE COMPLETE - 3+ VIEW; LEFT FOOT - COMPLETE 3+ VIEW COMPARISON:  None Available. FINDINGS: Left ankle: The ankle mortise is symmetric and intact. Minimal distal medial malleolar degenerative spurring. Joint spaces are preserved. No acute fracture or dislocation. Mild-to-moderate vascular calcifications. -- Left foot: Moderate hallux valgus. Severe great toe metatarsophalangeal joint space narrowing with diffuse subchondral sclerosis, metatarsal head subchondral cystic changes, and peripheral osteophytes greatest laterally.  Postsurgical changes of the proximal phalanx of the great toe with likely prior distal shaft osteotomy and overlying cerclage wiring. Postsurgical changes of the distal second metatarsal, likely an osteotomy with overlying screw. There is arthrodesis of the second toe PIP joint. Moderate interphalangeal joint space narrowing diffusely. No acute fracture or dislocation. IMPRESSION: 1. No acute fracture. 2. Moderate hallux valgus with severe great toe metatarsophalangeal joint osteoarthritis. 3. Postsurgical changes of the proximal phalanx of the great toe and distal second metatarsal. Electronically Signed   By: Yvonne Kendall M.D.   On: 09/10/2022 13:03    Procedures Procedures (including critical care time)  Medications Ordered in UC Medications - No data to display  Initial Impression / Assessment and Plan / UC Course  I have reviewed the triage vital signs and the nursing notes.  Pertinent labs & imaging results that were available during my care of the patient were reviewed by me and considered in my medical decision making (see chart for details).   82 y/o male presents for left foot and ankle pain since yesterday. Reports ground level fall onto all fours after his left leg went to sleep due to position.  He is not experiencing any numbness  or weakness in his leg at this time.  He would like imaging performed.  His blood pressure is 89/64.  He was admitted recently from the ED for sepsis, cholangitis and pulmonary embolism.  He followed up with his primary care provider yesterday and reportedly had normal blood pressure and normal lab work.  He says he did not know his blood pressure was low.  He has not had any headaches, fatigue, dizziness, palpitations, chest pain, breathing difficulty.  Denies speech or balance problems.  On exam patient has tenderness palpation of the lateral malleolus and metatarsals 3, 4 and 5.  Otherwise he has normal cranial nerve exam and full strength of lower  extremities and upper extremities.  Chest is clear to auscultation heart regular rate and rhythm.  Given his hypotension and recent admission to the ED as well as recent fall and the fact that he is on Eliquis I have advised further and immediate workup in the emergency department at this time.  I discussed that even though he did not hit his head he still could have a intracranial bleed which could lead to hypotension and worsening symptoms if not evaluated and treated.  Also discussed that he may need further workup to see why his blood pressure is suddenly low again.  Patient says he does not want to go to the ER since he is just been there a couple of times.  I discussed with him that he is taking a big risk by not going to the ED.  He agrees to sign an AMA stating he understands the risks of not going to the ED for workup of the hypotension and recent fall.  He would like imaging of his foot and ankle only at this time.  He declines further workup for the hypotension.  X-rays today do not show any acute abnormality.  Discussed with patient.  Suspect sprain of foot.  Patient given a send ankle brace.  Reviewed Tylenol and Voltaren gel for pain.  Reviewed RICE guidelines.  Reviewed return precautions.  Again I thoroughly discussed going to the emergency department for any red flag signs or symptoms associated with the hypotension.  Otherwise advised to follow-up closely with his PCP.   Final Clinical Impressions(s) / UC Diagnoses   Final diagnoses:  Sprain of left foot, initial encounter  Hypotension, unspecified hypotension type     Discharge Instructions      -No fractures on your x-rays.  You likely sprained your foot. - Ice and elevate foot.  Tylenol and Voltaren gel for pain.  We also gave you a brace to use.  If no improvement over the next 7 to 10 days or symptoms worsen follow-up with PCP or orthopedics. - We did discuss your blood pressure being low and given your recent  hospitalizations as well as a recent fall I have advised you to go to the emergency department to have a CT of your head performed.  Discussed the possibility of small intracranial bleed which I cannot rule out in urgent care.  You signed an AMA at this time and I decided to hold off on going to the ED. - You do need to go to the ER if you start to have headaches, increased dizziness or fatigue, confusion, difficulty walking or speaking, numbness/tingling or weakness in extremity or face, facial drooping, chest pain, palpitations, shortness of breath, fevers or you are generally feeling worse.  Call 911 or have someone take you immediately to the ER.  ED Prescriptions   None    PDMP not reviewed this encounter.   Danton Clap, PA-C 09/10/22 1327

## 2022-09-10 NOTE — Discharge Instructions (Addendum)
-  No fractures on your x-rays.  You likely sprained your foot. - Ice and elevate foot.  Tylenol and Voltaren gel for pain.  We also gave you a brace to use.  If no improvement over the next 7 to 10 days or symptoms worsen follow-up with PCP or orthopedics. - We did discuss your blood pressure being low and given your recent hospitalizations as well as a recent fall I have advised you to go to the emergency department to have a CT of your head performed.  Discussed the possibility of small intracranial bleed which I cannot rule out in urgent care.  You signed an AMA at this time and I decided to hold off on going to the ED. - You do need to go to the ER if you start to have headaches, increased dizziness or fatigue, confusion, difficulty walking or speaking, numbness/tingling or weakness in extremity or face, facial drooping, chest pain, palpitations, shortness of breath, fevers or you are generally feeling worse.  Call 911 or have someone take you immediately to the ER.

## 2022-09-10 NOTE — Anesthesia Postprocedure Evaluation (Signed)
Anesthesia Post Note  Patient: [name]  Procedure(s) Performed: ENDOSCOPIC RETROGRADE CHOLANGIOPANCREATOGRAPHY (ERCP)  Patient location during evaluation: Endoscopy Anesthesia Type: General Level of consciousness: awake and alert Pain management: pain level controlled Vital Signs Assessment: post-procedure vital signs reviewed and stable Respiratory status: spontaneous breathing, nonlabored ventilation, respiratory function stable and patient connected to nasal cannula oxygen Cardiovascular status: blood pressure returned to baseline and stable Postop Assessment: no apparent nausea or vomiting Anesthetic complications: no   No notable events documented.   Last Vitals:  Vitals:   08/25/22 1050 08/25/22 1100  BP: 125/74 132/83  Pulse:    Resp:    Temp:    SpO2:      Last Pain:  Vitals:   08/25/22 1100  TempSrc:   PainSc: 0-No pain                 Martha Clan

## 2022-09-10 NOTE — ED Triage Notes (Addendum)
Pt states he had his legs crossed sitting in a chair. When he uncrossed them he stood up and fell. He c/o left ankle pain. Pt denies hitting his head.

## 2022-09-11 ENCOUNTER — Telehealth: Payer: Self-pay

## 2022-09-11 NOTE — Telephone Encounter (Signed)
Patient submitted blood pressure readings via MyChart. Called the patient he is experiencing feeling lightheaded, denies any other symptoms. Pt stated he drinks very little water, his wife and daughter have order ensure and pedialyte( he hasn't tried the pedialyte).    2/21 - Dr. Delfina Redwood - [place] 100/60  2/22 - [facility] 89/64  2/22 - 7:10 pm Left Arm - 102/66 HR 88                                Right Arm - 117/74 HR 92               9:42 pm Left Arm - 98/67 HR 81                                 Right Arm - 90/61 HR 79  Took toprol - xl or lisinopril last night at bedtime.  Have had ongoing issue with low blood pressure since hospital visit 2/7.  Would appreciate guidance on where to go from this.

## 2022-09-11 NOTE — Telephone Encounter (Signed)
Spoke to the patient, Per Brian [name]  Chart reviewed. He was recently admitted with sepsis following ERCP and pulmonary embolism. He is now on Apixaban.  He can hold the Lisinopril for now given low BPs. He should have f/u with his PCP and GI. See if he has seen them yet. See if he is symptomatic with hypotension. See if he is having diarrhea, vomiting, melena, hematochezia, hematuria. If he is having significant symptoms and cannot see primary care today, he may need to go to the ED.  Patient was advised to hold Lisinopril Patient denied all the symptoms above, he is having difficulty contacting his PCP with a  follow up and he did have f/u with GI. Patient stated he is having difficulty refilling his Eliquis ,he will continue to call PCP for assistance.

## 2022-09-11 NOTE — Telephone Encounter (Signed)
Please contact patient to review his symptoms. Chart reviewed. He was recently admitted with sepsis following ERCP and pulmonary embolism. He is now on Apixaban.  He can hold the Lisinopril for now given low BPs. He should have f/u with his PCP and GI. See if he has seen them yet. See if he is symptomatic with hypotension. See if he is having diarrhea, vomiting, melena, hematochezia, hematuria. If he is having significant symptoms and cannot see primary care today, he may need to go to the ED.  Richardson Dopp, PA-C    09/11/2022 2:15 PM

## 2022-09-14 NOTE — Telephone Encounter (Signed)
Continue Toprol. Check BP once a day and send readings after 1 week. Richardson Dopp, PA-C    09/14/2022 4:34 PM

## 2022-09-14 NOTE — Telephone Encounter (Signed)
Please check with patient to see how his BP is doing now. Also ask if he was able to arrange an appointment with his PCP to follow up on his admission with sepsis. Richardson Dopp, PA-C    09/14/2022 2:46 PM

## 2022-09-14 NOTE — Telephone Encounter (Signed)
Spoke with pt, he states his blood pressure is fluctuating since he has stopped the Lisinopril.  Yesterday it was before medications, 134/83 a.m 125/79 in the evening.   Pt states he is feeling ok, but sometime around 10-12:30, he gets a light headed feeling, then it leaves.  BP jumped up to 148/89 this morning, but he didn't take his Metoprolol last night, but he took it this morning.   Per pt and wife, pt is scheduled to see his PCP 09/22/22 and is seeing his GI dr 10/19/22.

## 2022-09-15 NOTE — Telephone Encounter (Signed)
Call placed to pt and he is aware to continue the Toprol and to monitor his blood pressures for the next week, keep a log, and send a report via mychart.

## 2022-09-17 ENCOUNTER — Ambulatory Visit: Payer: Medicare Other | Admitting: Family Medicine

## 2022-09-22 ENCOUNTER — Ambulatory Visit (INDEPENDENT_AMBULATORY_CARE_PROVIDER_SITE_OTHER): Payer: Medicare Other | Admitting: Family Medicine

## 2022-09-22 ENCOUNTER — Encounter: Payer: Self-pay | Admitting: Family Medicine

## 2022-09-22 VITALS — BP 130/85 | HR 67 | Temp 98.0°F | Resp 14 | Ht 66.0 in | Wt 158.4 lb

## 2022-09-22 DIAGNOSIS — H3582 Retinal ischemia: Secondary | ICD-10-CM | POA: Diagnosis not present

## 2022-09-22 DIAGNOSIS — I255 Ischemic cardiomyopathy: Secondary | ICD-10-CM

## 2022-09-22 DIAGNOSIS — I2693 Single subsegmental pulmonary embolism without acute cor pulmonale: Secondary | ICD-10-CM

## 2022-09-22 DIAGNOSIS — M545 Low back pain, unspecified: Secondary | ICD-10-CM | POA: Diagnosis not present

## 2022-09-22 DIAGNOSIS — Z85828 Personal history of other malignant neoplasm of skin: Secondary | ICD-10-CM | POA: Diagnosis not present

## 2022-09-22 DIAGNOSIS — I10 Essential (primary) hypertension: Secondary | ICD-10-CM

## 2022-09-22 DIAGNOSIS — I25119 Atherosclerotic heart disease of native coronary artery with unspecified angina pectoris: Secondary | ICD-10-CM

## 2022-09-22 DIAGNOSIS — I7121 Aneurysm of the ascending aorta, without rupture: Secondary | ICD-10-CM | POA: Diagnosis not present

## 2022-09-22 DIAGNOSIS — E7800 Pure hypercholesterolemia, unspecified: Secondary | ICD-10-CM | POA: Diagnosis not present

## 2022-09-22 DIAGNOSIS — G894 Chronic pain syndrome: Secondary | ICD-10-CM

## 2022-09-22 DIAGNOSIS — I714 Abdominal aortic aneurysm, without rupture, unspecified: Secondary | ICD-10-CM | POA: Insufficient documentation

## 2022-09-22 HISTORY — DX: Single subsegmental thrombotic pulmonary embolism without acute cor pulmonale: I26.93

## 2022-09-22 MED ORDER — OXYCODONE HCL 10 MG PO TABS: 10.0000 mg | ORAL_TABLET | Freq: Four times a day (QID) | ORAL | 0 refills | Status: DC | PRN

## 2022-09-22 MED ORDER — APIXABAN 5 MG PO TABS: 5.0000 mg | ORAL_TABLET | Freq: Two times a day (BID) | ORAL | 0 refills | Status: DC

## 2022-09-22 NOTE — Progress Notes (Signed)
I,Sulibeya S Dimas,acting as a Education administrator for Lavon Paganini, MD.,have documented all relevant documentation on the behalf of Lavon Paganini, MD,as directed by  Lavon Paganini, MD while in the presence of Lavon Paganini, MD.   New patient visit   Patient: Brian [name]   DOB: 1940-10-30   82 y.o. Male  MRN: [id number] Visit Date: 09/22/2022  Today's healthcare provider: Lavon Paganini, MD   Chief Complaint  Patient presents with  . New Patient (Initial Visit)   Subjective    Brian [name] is a 82 y.o. male who presents today as a new patient to establish care.  HPI   Last PCP Okey Dupre at [facility]. Patient needed to get his medical records transfer from [place]. Trying to transfer all care to local [place] practices  Wants to know if we will mange low blood pressure. 50/90  H/o MI in 1988 s/p PCI, ischemic cardiomyopathy  AAA followed by Cardiology with serial scans   HTN, HLD - on ASA, atorvastatin, lisinopril (holding currently for hypotension). Gets symptomatic hypotension with lightheadedness.  Cardiology is working on hypotension. Monitoring home BPs and reporting weekly to cardiology.  OSA on CPAP - seeing Dr Mortimer Fries - recently transferred care. Using CPAP with good compliance  Occasional anxiety since 04/2022  - had somatic symptoms when it first started, not on any medications. Doing better now.  H/o skin cancer - SCC and many AKs. Sees [facility] - needs local Derm  Was seen recently by Optho for lens issue and found to have microhemorrhages in R eye - was told that he needs MRI/MRA of head and neck for ocular ischemia  H/o PE - was seen at [facility] - very small - on Eliquis Quit smoking last month after hospitalization  H/o recurrent colitis - f/b Dr Allen Norris   H/o scoliosis and DJD leading to chronic back pain. Was started by previous PCP at [facility] on Hydrocodone for a few years and then moved to oxycodone 10 mg - Percocet 10s were  difficult to find about a year ago. Now taking Oxy IR 10 mg and tylenol separately. Gets 120 per month (number per day varies that he uses). Also uses OTC lidocaine patches.  - PDMP reviewed. Was on pain contract with [facility] physicians - maintains function with this dose - has never tried gabapentin, nortriptyline, or lyrica - maybe tried some other treatments many years ago - tried RFA and ESIs, PT multiple times with Dr Mina Marble in [facility] without relief.  Past Medical History:  Diagnosis Date  . Adenomatous polyp   . Carpal tunnel syndrome on left   . Cervical spine disease   . Coronary artery disease    S/p anterior MI tx with PCI in 1998  //  Myoview 6/22: EF 45, ant and septal scar; no ischemia; intermediate risk // Echocardiogram 7/22: EF 45-50, ant-sept and apical HK, Gr 1 DD, normal RVSF, mild MR  . Hyperlipidemia   . Hypertension   . Ischemic cardiomyopathy 05/19/2022   Echocardiogram 06/02/22: EF 45-50, global HK, Gr 1 DD, NL RVSF, NL PASP (RVSP 26.4), trivial MR, RAP 3  . Low back pain   . Myocardial infarction ([place]) 1998  . Sleep apnea    CPAP  . Wears dentures    full upper   Past Surgical History:  Procedure Laterality Date  . APPENDECTOMY    . bunions    . CATARACT EXTRACTION    . COLONOSCOPY WITH PROPOFOL N/A 02/19/2015   Procedure:  COLONOSCOPY WITH PROPOFOL;  Surgeon: Lucilla Lame, MD;  Location: [facility] ENDOSCOPY;  Service: Endoscopy;  Laterality: N/A;  . COLONOSCOPY WITH PROPOFOL N/A 06/05/2019   Procedure: COLONOSCOPY WITH BIOPSY;  Surgeon: Lucilla Lame, MD;  Location: [place];  Service: Endoscopy;  Laterality: N/A;  sleep apnea  . ERCP N/A 08/25/2022   Procedure: ENDOSCOPIC RETROGRADE CHOLANGIOPANCREATOGRAPHY (ERCP);  Surgeon: Lucilla Lame, MD;  Location: [facility] ENDOSCOPY;  Service: Endoscopy;  Laterality: N/A;  . HEMORRHOID BANDING    . HERNIA REPAIR    . lumbar disc protrussion  03/2010  . POLYPECTOMY N/A 06/05/2019   Procedure: POLYPECTOMY;  Surgeon: Lucilla Lame, MD;  Location: [place];  Service: Endoscopy;  Laterality: N/A;   Family Status  Relation Name Status  . Father  Deceased  . Mother  Deceased  . Brother  Alive       cabg   Family History  Problem Relation Age of Onset  . Kidney disease Father   . Hypertension Father   . Heart failure Mother   . Coronary artery disease Brother    Social History   Socioeconomic History  . Marital status: Married    Spouse name: [name]  . Number of children: 1  . Years of education: Not on file  . Highest education level: Not on file  Occupational History  . Occupation: retired    Comment: Chemical engineer  Tobacco Use  . Smoking status: Former    Packs/day: 0.50    Years: 60.00    Total pack years: 30.00    Types: Cigarettes  . Smokeless tobacco: Never  . Tobacco comments:    1/2 3/4 ppd (since age 74)  51 Use  . Vaping Use: Never used  Substance and Sexual Activity  . Alcohol use: Not Currently    Alcohol/week: 0.0 standard drinks of alcohol    Comment: occasionally  . Drug use: No  . Sexual activity: Not on file  Other Topics Concern  . Not on file  Social History Narrative  . Not on file   Social Determinants of Health   Financial Resource Strain: Not on file  Food Insecurity: No Food Insecurity (08/27/2022)   Hunger Vital Sign   . Worried About Charity fundraiser in the Last Year: Never true   . Ran Out of Food in the Last Year: Never true  Transportation Needs: No Transportation Needs (08/27/2022)   PRAPARE - Transportation   . Lack of Transportation (Medical): No   . Lack of Transportation (Non-Medical): No  Physical Activity: Not on file  Stress: Not on file  Social Connections: Not on file   Outpatient Medications Prior to Visit  Medication Sig  . apixaban (ELIQUIS) 5 MG TABS tablet Take 2 tablets (10 mg total) by mouth 2 (two) times daily for 7 days, THEN 1 tablet (5 mg total) 2 (two) times daily.  Marland Kitchen aspirin 81 MG  tablet Take 81 mg by mouth at bedtime.  Marland Kitchen atorvastatin (LIPITOR) 40 MG tablet Take 40 mg by mouth at bedtime.  . benzonatate (TESSALON) 200 MG capsule Take 1 capsule (200 mg total) by mouth 3 (three) times daily as needed for cough.  . Coenzyme Q10 (COQ10) 100 MG CAPS Take 1 capsule by mouth daily.    Marland Kitchen latanoprost (XALATAN) 0.005 % ophthalmic solution Place 1 drop into the right eye at bedtime.  Marland Kitchen lisinopril (ZESTRIL) 20 MG tablet Take 1 tablet (20 mg total) by mouth daily. (Patient taking differently: Take  20 mg by mouth at bedtime.)  . meclizine (ANTIVERT) 25 MG tablet Take 1 tablet (25 mg total) by mouth 2 (two) times daily as needed for dizziness.  . metoprolol succinate (TOPROL-XL) 25 MG 24 hr tablet TAKE 1 TABLET(25 MG) BY MOUTH DAILY (Patient taking differently: Take 25 mg by mouth at bedtime.)  . Multiple Vitamin (MULTIVITAMIN PO) Take 1 tablet by mouth daily.  . nitroGLYCERIN (NITROSTAT) 0.4 MG SL tablet Place 0.4 mg under the tongue every 5 (five) minutes as needed for chest pain.  . Oxycodone HCl 10 MG TABS Take 10-20 mg by mouth every 6 (six) hours as needed (moderate to severe pain).   Facility-Administered Medications Prior to Visit  Medication Dose Route Frequency Provider  . albuterol (PROVENTIL) (2.5 MG/3ML) 0.083% nebulizer solution 2.5 mg  2.5 mg Nebulization Once Stoneking, Hal, MD   No Known Allergies  Immunization History  Administered Date(s) Administered  . COVID-19, mRNA, vaccine(Comirnaty)12 years and older 05/10/2022  . Fluad Quad(high Dose 65+) 03/22/2019, 04/25/2021, 05/10/2022  . Influenza Split 04/19/2012, 05/03/2013  . Influenza, High Dose Seasonal PF 04/22/2016  . Influenza-Unspecified 05/11/2017, 04/19/2018  . PFIZER Comirnaty(Gray Top)Covid-19 Tri-Sucrose Vaccine 12/25/2020  . PFIZER(Purple Top)SARS-COV-2 Vaccination 08/07/2020  . Pension scheme manager 29yr & up 04/25/2021  . Pneumococcal Conjugate-13 05/18/2013  . Pneumococcal  Polysaccharide-23 10/23/2005    Health Maintenance  Topic Date Due  . DTaP/Tdap/Td (1 - Tdap) Never done  . Zoster Vaccines- Shingrix (1 of 2) Never done  . COLONOSCOPY (Pts 45-442yrInsurance coverage will need to be confirmed)  06/04/2020  . COVID-19 Vaccine (5 - 2023-24 season) 07/05/2022  . Medicare Annual Wellness (AWV)  06/06/2023  . Pneumonia Vaccine 6512Years old  Completed  . INFLUENZA VACCINE  Completed  . HPV VACCINES  Aged Out    Patient Care Team: HeKathalene FramesMD as PCP - General (Internal Medicine) SmBelva CromeMD (Inactive) as PCP - Cardiology (Cardiology)  Review of Systems  {Labs  Heme  Chem  Endocrine  Serology  Results Review (optional):23779}   Objective    There were no vitals taken for this visit. {Show previous vital signs (optional):23777}  Physical Exam ***  Depression Screen     No data to display         No results found for any visits on 09/22/22.  Assessment & Plan      Problem List Items Addressed This Visit       Cardiovascular and Mediastinum   Hypertension - Primary   Coronary artery disease involving native coronary artery of native heart with angina pectoris (HCC)   Ischemic cardiomyopathy   Ascending aortic aneurysm (HCC)   Single subsegmental pulmonary embolism without acute cor pulmonale (HCC)     Other   Hyperlipidemia   Low back pain   Chronic pain syndrome   Ocular ischemic syndrome   History of SCC (squamous cell carcinoma) of skin   Relevant Orders   Ambulatory referral to Dermatology     No follow-ups on file.     Total time spent on today's visit was greater than 30 minutes, including both face-to-face time and nonface-to-face time personally spent on review of chart (labs and imaging), discussing labs and goals, discussing further work-up, treatment options, referrals to specialist if needed, reviewing outside records of pertinent, answering patient's questions, and coordinating care.     I, AnLavon PaganiniMD, have reviewed all documentation for this visit. The documentation on 09/22/22 for the exam, diagnosis,  procedures, and orders are all accurate and complete.   Mariacristina Aday, Dionne Bucy, MD, MPH [facility]

## 2022-09-23 NOTE — Assessment & Plan Note (Signed)
Well controlled Continue current medications Working with cardiology re recent symptomatic hypotension - currently holding lisinopril Reviewed last metabolic panel

## 2022-09-23 NOTE — Assessment & Plan Note (Signed)
H/o anterior MI in 1998 s/p PCI Denies any angina Reviewed last Echo Continue ASA, statin, BB

## 2022-09-23 NOTE — Assessment & Plan Note (Signed)
F/b Cardiology - transferring care to [facility] soon Continue BB, ASA Holding ACEi currently due to symtpomatic hypotension

## 2022-09-24 ENCOUNTER — Encounter: Payer: Self-pay | Admitting: Family Medicine

## 2022-09-24 ENCOUNTER — Other Ambulatory Visit
Admission: RE | Admit: 2022-09-24 | Discharge: 2022-09-24 | Disposition: A | Discharge status: Home / Self Care | Payer: Medicare Other | Attending: Family Medicine | Admitting: Family Medicine

## 2022-09-24 DIAGNOSIS — H3582 Retinal ischemia: Secondary | ICD-10-CM | POA: Insufficient documentation

## 2022-09-24 LAB — SEDIMENTATION RATE: Sed Rate: 41 mm/hr — ABNORMAL HIGH (ref 0–20)

## 2022-09-24 LAB — C-REACTIVE PROTEIN: CRP: <0.5 mg/dL (ref <1.0)

## 2022-09-24 MED ORDER — LISINOPRIL 2.5 MG PO TABS: ORAL_TABLET | ORAL | 3 refills | Status: DC

## 2022-09-24 NOTE — Addendum Note (Signed)
Addended byKathlen Mody, Nicki Reaper T on: 09/24/2022 07:53 AM   Modules accepted: Orders

## 2022-09-24 NOTE — Assessment & Plan Note (Signed)
Reviewed documentation from hospitalization Recommend DOAC for total of 3-6 months Continue eliquis '5mg'$  BID Reassess stopping at next visit

## 2022-09-24 NOTE — Assessment & Plan Note (Signed)
F/b Cardiology

## 2022-09-24 NOTE — Assessment & Plan Note (Signed)
F/b Dermatology annually New referral placed for local Derm

## 2022-09-24 NOTE — Assessment & Plan Note (Signed)
Back pain as above Signed pain contract Continue oxycodone at current dose

## 2022-09-24 NOTE — Telephone Encounter (Signed)
Start Lisinopril 2.5 mg once daily BMET 2 weeks Richardson Dopp, PA-C    09/24/2022 7:52 AM

## 2022-09-24 NOTE — Assessment & Plan Note (Signed)
Chronic low back pain - PDMP reviewed. Was on pain contract with [facility] physicians - maintains function with this dose of oxycodone - will continue - has never tried gabapentin, nortriptyline, or lyrica - will consider at next visit - tried RFA and ESIs, PT multiple times with Dr Mina Marble in [facility] without relief. Pain contract signed and refill sent to pharmacy Went over expectations for chronic opioids

## 2022-09-24 NOTE — Assessment & Plan Note (Signed)
Previously well controlled - reviewed recent lipid panel Continue statin Repeat FLP and CMP at least annually Goal LDL < 70

## 2022-09-24 NOTE — Assessment & Plan Note (Signed)
Reviewed Optho notes Concern for GCA vs Ocular ischemic syndrome from carotid disease Check ESR and CRP for GCA No TTP over temporal artery Check MRA head and neck as well Upcoming appt with retina specialist

## 2022-09-24 NOTE — Addendum Note (Signed)
Addended by: Gaetano Net on: 09/24/2022 08:05 AM   Modules accepted: Orders

## 2022-09-25 ENCOUNTER — Encounter: Payer: Self-pay | Admitting: Family Medicine

## 2022-09-25 ENCOUNTER — Other Ambulatory Visit: Payer: Self-pay | Admitting: Internal Medicine

## 2022-09-25 DIAGNOSIS — H539 Unspecified visual disturbance: Secondary | ICD-10-CM

## 2022-09-29 ENCOUNTER — Ambulatory Visit (HOSPITAL_BASED_OUTPATIENT_CLINIC_OR_DEPARTMENT_OTHER): Payer: Medicare Other | Attending: Internal Medicine | Admitting: Radiology

## 2022-09-29 DIAGNOSIS — G4733 Obstructive sleep apnea (adult) (pediatric): Secondary | ICD-10-CM

## 2022-10-02 DIAGNOSIS — G4733 Obstructive sleep apnea (adult) (pediatric): Secondary | ICD-10-CM | POA: Diagnosis not present

## 2022-10-03 ENCOUNTER — Encounter: Payer: Self-pay | Admitting: Family Medicine

## 2022-10-03 DIAGNOSIS — H4051X3 Glaucoma secondary to other eye disorders, right eye, severe stage: Secondary | ICD-10-CM | POA: Diagnosis not present

## 2022-10-03 DIAGNOSIS — H401410 Capsular glaucoma with pseudoexfoliation of lens, right eye, stage unspecified: Secondary | ICD-10-CM | POA: Diagnosis not present

## 2022-10-04 DIAGNOSIS — H401410 Capsular glaucoma with pseudoexfoliation of lens, right eye, stage unspecified: Secondary | ICD-10-CM | POA: Diagnosis not present

## 2022-10-04 DIAGNOSIS — H4051X3 Glaucoma secondary to other eye disorders, right eye, severe stage: Secondary | ICD-10-CM | POA: Diagnosis not present

## 2022-10-05 DIAGNOSIS — H4051X3 Glaucoma secondary to other eye disorders, right eye, severe stage: Secondary | ICD-10-CM | POA: Diagnosis not present

## 2022-10-05 DIAGNOSIS — H3582 Retinal ischemia: Secondary | ICD-10-CM | POA: Diagnosis not present

## 2022-10-05 DIAGNOSIS — H4311 Vitreous hemorrhage, right eye: Secondary | ICD-10-CM | POA: Diagnosis not present

## 2022-10-06 ENCOUNTER — Ambulatory Visit: Payer: Medicare Other

## 2022-10-06 ENCOUNTER — Ambulatory Visit
Admission: RE | Admit: 2022-10-06 | Discharge: 2022-10-06 | Disposition: A | Discharge status: Home / Self Care | Payer: Medicare Other | Source: Ambulatory Visit | Attending: Family Medicine | Admitting: Family Medicine

## 2022-10-06 DIAGNOSIS — H3582 Retinal ischemia: Secondary | ICD-10-CM | POA: Diagnosis not present

## 2022-10-06 DIAGNOSIS — I708 Atherosclerosis of other arteries: Secondary | ICD-10-CM | POA: Insufficient documentation

## 2022-10-06 DIAGNOSIS — I6523 Occlusion and stenosis of bilateral carotid arteries: Secondary | ICD-10-CM | POA: Insufficient documentation

## 2022-10-06 DIAGNOSIS — H5460 Unqualified visual loss, one eye, unspecified: Secondary | ICD-10-CM | POA: Insufficient documentation

## 2022-10-06 MED ORDER — GADOBUTROL 1 MMOL/ML IV SOLN
7.0000 mL | Freq: Once | INTRAVENOUS | Status: AC | PRN
  Administered 2022-10-06: 7 mL via INTRAVENOUS

## 2022-10-09 ENCOUNTER — Other Ambulatory Visit: Payer: Medicare Other

## 2022-10-12 DIAGNOSIS — H4311 Vitreous hemorrhage, right eye: Secondary | ICD-10-CM | POA: Diagnosis not present

## 2022-10-14 DIAGNOSIS — H4051X3 Glaucoma secondary to other eye disorders, right eye, severe stage: Secondary | ICD-10-CM | POA: Diagnosis not present

## 2022-10-14 DIAGNOSIS — H4311 Vitreous hemorrhage, right eye: Secondary | ICD-10-CM | POA: Diagnosis not present

## 2022-10-16 ENCOUNTER — Other Ambulatory Visit
Admission: RE | Admit: 2022-10-16 | Discharge: 2022-10-16 | Disposition: A | Discharge status: Home / Self Care | Payer: Medicare Other | Attending: Physician Assistant | Admitting: Physician Assistant

## 2022-10-16 DIAGNOSIS — Z79899 Other long term (current) drug therapy: Secondary | ICD-10-CM | POA: Diagnosis not present

## 2022-10-16 LAB — BASIC METABOLIC PANEL
Anion gap: 9 (ref 5–15)
BUN: 24 mg/dL — ABNORMAL HIGH (ref 8–23)
CO2: 18 mmol/L — ABNORMAL LOW (ref 22–32)
Calcium: 8.8 mg/dL — ABNORMAL LOW (ref 8.9–10.3)
Chloride: 105 mmol/L (ref 98–111)
Creatinine, Ser: 0.95 mg/dL (ref 0.61–1.24)
GFR, Estimated: >60 mL/min (ref >60)
Glucose, Bld: 87 mg/dL (ref 70–99)
Potassium: 3.8 mmol/L (ref 3.5–5.1)
Sodium: 132 mmol/L — ABNORMAL LOW (ref 135–145)

## 2022-10-16 NOTE — Addendum Note (Signed)
Addended by: Memory Argue H on: 10/16/2022 01:08 PM   Modules accepted: Orders

## 2022-10-19 ENCOUNTER — Encounter: Payer: Self-pay | Admitting: Gastroenterology

## 2022-10-19 ENCOUNTER — Ambulatory Visit: Payer: Medicare Other | Admitting: Gastroenterology

## 2022-10-19 ENCOUNTER — Telehealth: Payer: Self-pay

## 2022-10-19 VITALS — BP 144/75 | HR 72 | Temp 97.7°F | Wt 156.0 lb

## 2022-10-19 DIAGNOSIS — I729 Aneurysm of unspecified site: Secondary | ICD-10-CM

## 2022-10-19 DIAGNOSIS — I10 Essential (primary) hypertension: Secondary | ICD-10-CM

## 2022-10-19 DIAGNOSIS — Z79899 Other long term (current) drug therapy: Secondary | ICD-10-CM

## 2022-10-19 DIAGNOSIS — K8050 Calculus of bile duct without cholangitis or cholecystitis without obstruction: Secondary | ICD-10-CM | POA: Diagnosis not present

## 2022-10-19 NOTE — Progress Notes (Signed)
Primary Care Physician: Virginia Crews, MD  Primary Gastroenterologist:  Dr. Lucilla Lame  No chief complaint on file.   HPI: Brian [name] is a 82 y.o. male here for follow-up after undergoing an outpatient ERCP with a sphincterotomy and subsequent increased liver enzymes with shortness of breath and admission to the hospital with the findings of a pulmonary embolus.  The patient was started on anticoagulation at that time.  The patient's liver enzymes came down significantly during his hospital stay.  The patient was recommended to follow-up with me as an outpatient.  Past Medical History:  Diagnosis Date   AAA (abdominal aortic aneurysm) ([place])    Adenomatous polyp    Anxiety    Carpal tunnel syndrome on left    Cervical spine disease    Colitis    Coronary artery disease    S/p anterior MI tx with PCI in 1998  //  Myoview 6/22: EF 45, ant and septal scar; no ischemia; intermediate risk // Echocardiogram 7/22: EF 45-50, ant-sept and apical HK, Gr 1 DD, normal RVSF, mild MR   Deviated septum    Hyperlipidemia    Hypertension    Ischemic cardiomyopathy 05/19/2022   Echocardiogram 06/02/22: EF 45-50, global HK, Gr 1 DD, NL RVSF, NL PASP (RVSP 26.4), trivial MR, RAP 3   Low back pain    Myocardial infarction ([place]) 1998   Personal history of pulmonary embolism    Sleep apnea    CPAP   Wears dentures    full upper    Current Outpatient Medications  Medication Sig Dispense Refill   apixaban (ELIQUIS) 5 MG TABS tablet Take 1 tablet (5 mg total) by mouth 2 (two) times daily. 180 tablet 0   aspirin 81 MG tablet Take 81 mg by mouth at bedtime.     atorvastatin (LIPITOR) 40 MG tablet Take 40 mg by mouth at bedtime.     Coenzyme Q10 (COQ10) 100 MG CAPS Take 1 capsule by mouth daily.       latanoprost (XALATAN) 0.005 % ophthalmic solution Place 1 drop into the right eye at bedtime.     lisinopril (ZESTRIL) 2.5 MG tablet Take 1 tablet by mouth daily 12 hours apart from  Metoprolol 90 tablet 3   meclizine (ANTIVERT) 25 MG tablet Take 1 tablet (25 mg total) by mouth 2 (two) times daily as needed for dizziness. 30 tablet 0   metoprolol succinate (TOPROL-XL) 25 MG 24 hr tablet TAKE 1 TABLET(25 MG) BY MOUTH DAILY (Patient taking differently: Take 25 mg by mouth at bedtime.) 30 tablet 11   Multiple Vitamin (MULTIVITAMIN PO) Take 1 tablet by mouth daily.     nitroGLYCERIN (NITROSTAT) 0.4 MG SL tablet Place 0.4 mg under the tongue every 5 (five) minutes as needed for chest pain.     Oxycodone HCl 10 MG TABS Take 1-2 tablets (10-20 mg total) by mouth every 6 (six) hours as needed (moderate to severe pain). 120 tablet 0   saccharomyces boulardii (FLORASTOR) 250 MG capsule Take 250 mg by mouth 2 (two) times daily.     No current facility-administered medications for this visit.   Facility-Administered Medications Ordered in Other Visits  Medication Dose Route Frequency Provider Last Rate Last Admin   albuterol (PROVENTIL) (2.5 MG/3ML) 0.083% nebulizer solution 2.5 mg  2.5 mg Nebulization Once Lajean Manes, MD        Allergies as of 10/19/2022   (No Known Allergies)    ROS:  General: Negative for  anorexia, weight loss, fever, chills, fatigue, weakness. ENT: Negative for hoarseness, difficulty swallowing , nasal congestion. CV: Negative for chest pain, angina, palpitations, dyspnea on exertion, peripheral edema.  Respiratory: Negative for dyspnea at rest, dyspnea on exertion, cough, sputum, wheezing.  GI: See history of present illness. GU:  Negative for dysuria, hematuria, urinary incontinence, urinary frequency, nocturnal urination.  Endo: Negative for unusual weight change.    Physical Examination:   There were no vitals taken for this visit.  General: Well-nourished, well-developed in no acute distress.  Eyes: No icterus. Conjunctivae pink. Neuro: Alert and oriented x 3.  Grossly intact. Skin: Warm and dry, no jaundice.   Psych: Alert and cooperative,  normal mood and affect.  Labs:    Imaging Studies: MR Angiogram Neck W Wo Contrast  Result Date: 10/09/2022 CLINICAL DATA:  Initial evaluation for monocular visual loss, concern for giant cell arteritis. EXAM: MRA NECK WITHOUT AND WITH CONTRAST MRA HEAD WITHOUT CONTRAST TECHNIQUE: Multiplanar and multiecho pulse sequences of the neck were obtained without and with intravenous contrast. Angiographic images of the neck were obtained using MRA technique without and with intravenous contrast; Angiographic images of the Circle of Willis were obtained using MRA technique without intravenous contrast. CONTRAST:  65mL GADAVIST GADOBUTROL 1 MMOL/ML IV SOLN COMPARISON:  Prior ultrasound from 06/02/2022. FINDINGS: MRA NECK FINDINGS Aortic arch: Visualized aortic arch normal in caliber with standard 3 vessel morphology. No visible intimal irregularity, stenosis, or other abnormality about the origin of the great vessels. Right carotid system: Right common and internal carotid arteries are patent without dissection. No findings of vasculopathy. Atheromatous change about the right carotid bulb with up to 40% stenosis by NASCET criteria. Left carotid system: Left common and internal carotid arteries are patent without dissection. No findings of vasculopathy. Mild atheromatous change about the left carotid bulb with mild 30% stenosis by NASCET criteria. Vertebral arteries: Both vertebral arteries arise from the subclavian arteries. Vertebral arteries are largely code dominant. Short-segment 40% stenosis involving the proximal left subclavian artery noted (series 1061, image 7). Vertebral arteries M cells are widely patent without stenosis, dissection, or evidence for vasculopathy. Other: None. MRA HEAD FINDINGS Anterior circulation: Both internal carotid arteries are patent to the termini without hemodynamically significant stenosis. 4 mm posteriorly directed outpouching extending from the proximal cavernous right ICA  consistent with a small aneurysm (series 5, image 83). A1 segments patent bilaterally. Normal anterior communicating artery complex. Both ACAs widely patent without stenosis or other abnormality. No M1 stenosis or occlusion. No proximal high-grade stenosis or occlusion. Distal MCA branches well perfused and symmetric. No beading or irregularity. Posterior circulation: Visualized V4 segments widely patent. Left vertebral artery slightly dominant. Partially visualized PICA are patent. Basilar patent without stenosis or other abnormality. Superior cerebral arteries patent bilaterally. Left PCA supplied via the basilar. Fetal type origin of the right PCA. Both PCAs patent to their distal aspects without stenosis. No beading or irregularity. Anatomic variants: Fetal type right PCA. Other: None. IMPRESSION: MRA HEAD IMPRESSION: 1. Negative intracranial MRA for large vessel occlusion. No hemodynamically significant or correctable stenosis. No significant beading or irregularity to suggest underlying vasculopathy by MRA. 2. 4 mm cavernous right ICA aneurysm. MRA NECK IMPRESSION: 1. Atheromatous change about the carotid bifurcations with up to 40% stenosis bilaterally. 2. Short-segment 40% stenosis involving the proximal left subclavian artery. 3. Otherwise wide patency of both vertebral arteries within the neck. No evidence for dissection or vasculopathy by MRA. Electronically Signed   By: Pincus Badder.D.  On: 10/09/2022 22:21   MR Angiogram Head Wo Contrast  Result Date: 10/09/2022 CLINICAL DATA:  Initial evaluation for monocular visual loss, concern for giant cell arteritis. EXAM: MRA NECK WITHOUT AND WITH CONTRAST MRA HEAD WITHOUT CONTRAST TECHNIQUE: Multiplanar and multiecho pulse sequences of the neck were obtained without and with intravenous contrast. Angiographic images of the neck were obtained using MRA technique without and with intravenous contrast; Angiographic images of the Circle of Willis were  obtained using MRA technique without intravenous contrast. CONTRAST:  102mL GADAVIST GADOBUTROL 1 MMOL/ML IV SOLN COMPARISON:  Prior ultrasound from 06/02/2022. FINDINGS: MRA NECK FINDINGS Aortic arch: Visualized aortic arch normal in caliber with standard 3 vessel morphology. No visible intimal irregularity, stenosis, or other abnormality about the origin of the great vessels. Right carotid system: Right common and internal carotid arteries are patent without dissection. No findings of vasculopathy. Atheromatous change about the right carotid bulb with up to 40% stenosis by NASCET criteria. Left carotid system: Left common and internal carotid arteries are patent without dissection. No findings of vasculopathy. Mild atheromatous change about the left carotid bulb with mild 30% stenosis by NASCET criteria. Vertebral arteries: Both vertebral arteries arise from the subclavian arteries. Vertebral arteries are largely code dominant. Short-segment 40% stenosis involving the proximal left subclavian artery noted (series 1061, image 7). Vertebral arteries M cells are widely patent without stenosis, dissection, or evidence for vasculopathy. Other: None. MRA HEAD FINDINGS Anterior circulation: Both internal carotid arteries are patent to the termini without hemodynamically significant stenosis. 4 mm posteriorly directed outpouching extending from the proximal cavernous right ICA consistent with a small aneurysm (series 5, image 83). A1 segments patent bilaterally. Normal anterior communicating artery complex. Both ACAs widely patent without stenosis or other abnormality. No M1 stenosis or occlusion. No proximal high-grade stenosis or occlusion. Distal MCA branches well perfused and symmetric. No beading or irregularity. Posterior circulation: Visualized V4 segments widely patent. Left vertebral artery slightly dominant. Partially visualized PICA are patent. Basilar patent without stenosis or other abnormality. Superior  cerebral arteries patent bilaterally. Left PCA supplied via the basilar. Fetal type origin of the right PCA. Both PCAs patent to their distal aspects without stenosis. No beading or irregularity. Anatomic variants: Fetal type right PCA. Other: None. IMPRESSION: MRA HEAD IMPRESSION: 1. Negative intracranial MRA for large vessel occlusion. No hemodynamically significant or correctable stenosis. No significant beading or irregularity to suggest underlying vasculopathy by MRA. 2. 4 mm cavernous right ICA aneurysm. MRA NECK IMPRESSION: 1. Atheromatous change about the carotid bifurcations with up to 40% stenosis bilaterally. 2. Short-segment 40% stenosis involving the proximal left subclavian artery. 3. Otherwise wide patency of both vertebral arteries within the neck. No evidence for dissection or vasculopathy by MRA. Electronically Signed   By: Jeannine Boga M.D.   On: 10/09/2022 22:21    Assessment and Plan:   Brian [name] is a 82 y.o. y/o male who comes in after hospital stay with post ERCP sepsis from an unknown cause.  The patient had a sphincterotomy due to a dilated common bile duct with sludge removed from common bile duct.  The patient then had a pulmonary embolus noted and was started on anticoagulation.  The patient states he is doing well now without any issues related to his GI tract.  The patient has been told to follow-up as needed.     Lucilla Lame, MD. Marval Regal    Note: This dictation was prepared with Dragon dictation along with smaller phrase technology. Any transcriptional errors  that result from this process are unintentional.

## 2022-10-19 NOTE — Telephone Encounter (Signed)
Increase Lisinopril to 2.5 mg twice daily. Continue on Toprol XL 25 mg once daily. BMET in 1 month. Richardson Dopp, PA-C    10/19/2022 6:33 PM

## 2022-10-19 NOTE — Telephone Encounter (Signed)
-----   Message from Virginia Crews, MD sent at 10/16/2022 10:24 AM EDT ----- MRA of head and neck reviewed.  Please be sure to relay these results to the Ophthalmologist that recommended them as well.    There is a very small (28mm) aneurysm in one of the vessels of the brain. This is likely too small for intervention, but we can refer to neurosurgery for recommendations if you would like their input.    There is also about 40% stenosis (narrowing) of the carotid arteries on both sides of the neck. There is no need for intervention at less than 50%.  We recommend monitoring annually with an [place] and medical management of risk factors (not smoking, taking aspirin and cholesterol medications as prescribed).

## 2022-10-19 NOTE — Telephone Encounter (Signed)
Spoke to the patient he has concerns pertaining to his morning blood pressure readings. Patient stated his blood pressure readings are elevated prior to taking his lisinopril however, he does not recheck his blood pressure reading after taking his morning medication. His blood pressure does decrease to normal range in the evenings. Patients right ankle and foot have been swollen for years however, the swelling has not increased . Patient will like to know if there are any additional medications the provider can prescribe. Will forward to APP.         3/28 - Thursday 156/91 HR 76 7:15 am                                   121/74 HR 71 10:30 pm 3/29 - Friday 163/101 HR 81 7:10 am                            114/70 HR 67 10:16 pm 3/30 - Saturday 175/101 HR 68 9:10 am                                   131/75 HR 71 9:30 pm 3/31 - Sunday 175/104 HR 83 8:15 am                               11 8/66 HR 65 9:00 pm

## 2022-10-19 NOTE — Telephone Encounter (Signed)
I have not been involved in patient's care before or have any upcoming appts with patient so will defer to [facility] on this one. Thanks.

## 2022-10-20 ENCOUNTER — Other Ambulatory Visit: Payer: Self-pay | Admitting: *Deleted

## 2022-10-20 ENCOUNTER — Telehealth: Payer: Self-pay | Admitting: Physician Assistant

## 2022-10-20 DIAGNOSIS — H4051X3 Glaucoma secondary to other eye disorders, right eye, severe stage: Secondary | ICD-10-CM | POA: Diagnosis not present

## 2022-10-20 DIAGNOSIS — Z79899 Other long term (current) drug therapy: Secondary | ICD-10-CM

## 2022-10-20 NOTE — Telephone Encounter (Signed)
   Pre-operative Risk Assessment    Patient Name: Brian [name]  DOB: 04-Nov-1940 MRN: [id number]      Request for Surgical Clearance    Procedure:   Ahmed Tube Shunt with Tutoplast of the Right Eye  Date of Surgery:  Clearance 10/26/22                                 Surgeon:  Dr. Benay Pillow Surgeon's Group or Practice Name:  [facility]  Phone number:  [phone]  Fax number:  [phone]   Type of Clearance Requested:   - Pharmacy:  Hold Apixaban (Eliquis)     Type of Anesthesia:  Local    Additional requests/questions:   Caller stated clearance can be sent to her attention.  Caller stated patient is having emergency surgery due to glaucoma.    Caller stated they will need to know how long to hold patient's Eliquis .  Signed, Heloise Beecham   10/20/2022, 3:51 PM

## 2022-10-20 NOTE — Telephone Encounter (Signed)
Call placed to pt.  He understands to continue on Lisinopril 2.5 in the morning, Toprol XL 25 in the evening, along with the Diamox twice a day, prescribed by his Ophthalmologist.  Pt aware that the order for the BMET has been placed.  Pt was very complimentary about the care he has received and what a fine job we have done and are doing.

## 2022-10-20 NOTE — Telephone Encounter (Signed)
Ophthalmologist placed him back on Diamox. Remain on Lisinopril 2.5 in AM and Toprol XL 25 in PM (instead of increasing to Lisinopril 2.5 twice daily). Richardson Dopp, PA-C    10/20/2022 2:05 PM

## 2022-10-20 NOTE — Telephone Encounter (Signed)
   Patient Name: Brian [name]  DOB: 05/10/1941 MRN: [id number]  Primary Cardiologist: Sinclair Grooms, MD (Inactive)  Chart reviewed as part of pre-operative protocol coverage.   Patient recently prescribed Eliquis due to pulmonary embolism.  This medication is currently not managed by his cardiologist and guidance on holding medicine should come from patient's PCP.   Mable Fill, Marissa Nestle, NP 10/20/2022, 4:04 PM

## 2022-10-21 ENCOUNTER — Encounter (INDEPENDENT_AMBULATORY_CARE_PROVIDER_SITE_OTHER): Payer: Medicare Other | Admitting: Family Medicine

## 2022-10-21 ENCOUNTER — Telehealth: Payer: Self-pay

## 2022-10-21 DIAGNOSIS — I2693 Single subsegmental pulmonary embolism without acute cor pulmonale: Secondary | ICD-10-CM | POA: Diagnosis not present

## 2022-10-21 NOTE — Telephone Encounter (Signed)
Copied from [place] [id number]. Topic: General - Other >> Oct 21, 2022  3:17 PM Everette C wrote: Reason for CRM: Nevin Bloodgood with [facility] has called to confirm receipt of a clearance form submitted to the practice via fax today 10/21/22 at 3:15 PM   The form is related to the patient's apixaban (ELIQUIS) 5 MG TABS tablet [id number] prescription   Please contact Nevin Bloodgood at [phone] to confirm when possible  The urgency of this request has been stressed

## 2022-10-22 ENCOUNTER — Encounter: Payer: Self-pay | Admitting: Ophthalmology

## 2022-10-22 ENCOUNTER — Ambulatory Visit: Payer: Self-pay | Admitting: *Deleted

## 2022-10-22 MED ORDER — ENOXAPARIN SODIUM 100 MG/ML IJ SOSY: 1.0000 mg/kg | PREFILLED_SYRINGE | Freq: Two times a day (BID) | INTRAMUSCULAR | 0 refills | Status: DC

## 2022-10-22 NOTE — Addendum Note (Signed)
Addended by: Virginia Crews on: 10/22/2022 12:51 PM   Modules accepted: Orders

## 2022-10-22 NOTE — Telephone Encounter (Signed)
[name] was going to [employer].  I have messaged patient to give instructions.  Form completed and will be faxed this AM.

## 2022-10-22 NOTE — Telephone Encounter (Signed)

## 2022-10-22 NOTE — Anesthesia Preprocedure Evaluation (Addendum)
Anesthesia Evaluation  Patient identified by MRN, date of birth, ID band Patient awake    Reviewed: reviewed documented beta blocker date and time   Airway Mallampati: III  TM Distance: <3 FB Neck ROM: Full    Dental no notable dental hx. (+) Upper Dentures   Pulmonary sleep apnea and Continuous Positive Airway Pressure Ventilation , former smoker Hx PE Aug 26, 2022 on Eliquis and ASA, plans to stop Eliquis plan to bridge to Lovenox per primary care physician  Started smoking aged 15 years, has quit off and on, quit again in February.    Pulmonary exam normal breath sounds clear to auscultation       Cardiovascular hypertension, Pt. on home beta blockers and Pt. on medications + CAD, + Past MI, + Cardiac Stents and + Peripheral Vascular Disease  Normal cardiovascular exam Rhythm:Regular Rate:Normal  AMI at aged 82 years, heart stent x 2 1998.  Has 4.1 cm aortic aneurysm in 2021 and stable in 2022 but no update since then that I can find   Neuro/Psych   Anxiety     Reports very understandable anxiety today Hx carpal tunnel syndrome Hx vertigo    GI/Hepatic   Endo/Other    Renal/GU      Musculoskeletal   Abdominal   Peds  Hematology   Anesthesia Other Findings Ocular ischemic syndrome  Myocardial infarction  Coronary artery disease Hyperlipidemia  Carpal tunnel syndrome on left Cervical spine disease  Hypertension Adenomatous polyp  Low back pain Medical (General) History History ICD Code Coronary artery disease, treated with angioplasty/PTC RA, 1998   Tobacco use   obstructive sleep apnea- sleep apnea CPAP   Hyperlipidemia   carpal tunnel ? left   cervical spine disc disease   lumbar disc protrussion Dr Jerl Santosalldorf epidurals 03/2010   [facility]   ultrasound negative for AAA 09/2011   hypertension   tubular adenomatous polyp 04/2012-Dr. Laural BenesJohnson [facility]   ortho Dr   low back  pain, Dr Jerl Santosalldorf, Dr Regino SchultzeWang, [facility]   possible ruptured disc left L3/L4 with for a minimal narrowing however symptoms are on the right 07/2012 proceed with MRI of brain normal 08/2012   [facility] Dr Maple [facility]   urology Dr Retta DionesahLstedt   bilateral spermatocele   aortic aneurysm 4.1 centimeters CT scan chest 06/2019 stable and 06/2020, stable 06/2021   screening Ct chest 10/2-15 and 07/2015 small pulmonary nodules stable 07/2016 and 08/2017, and -05/2019, stable 06/2020, stable 06/2021   ischemic colitis/C. difficile 02/2015   09/2018 admission [facility] C Diff   atherosclerosis aorta seen on CT scan chest February 2019 and 06/2020   possible emphysema on CT scan 05/2019 consider pulmonary function tests   Nicotine dependence, unspecified, Sleep apnea  Wears dentures Ischemic cardiomyopathy  Anxiety Personal history of pulmonary embolism  Deviated septum Colitis  AAA (abdominal aortic aneurysm)   Reproductive/Obstetrics                             Anesthesia Physical Anesthesia Plan  ASA: 3  Anesthesia Plan: MAC   Post-op Pain Management:    Induction: Intravenous  PONV Risk Score and Plan:   Airway Management Planned: Natural Airway and Nasal Cannula  Additional Equipment:   Intra-op Plan:   Post-operative Plan:   Informed Consent: I have reviewed the patients History and Physical, chart, labs and discussed the procedure including the risks,  benefits and alternatives for the proposed anesthesia with the patient or authorized representative who has indicated his/her understanding and acceptance.     Dental Advisory Given  Plan Discussed with: Anesthesiologist, CRNA and Surgeon  Anesthesia Plan Comments: (Patient consented for risks of anesthesia including but not limited to:  - adverse reactions to medications - damage to eyes, teeth, lips or other oral mucosa - nerve damage due to positioning  - sore throat or  hoarseness - Damage to heart, brain, nerves, lungs, other parts of body or loss of life  Patient voiced understanding.)       Anesthesia Quick Evaluation

## 2022-10-22 NOTE — Telephone Encounter (Signed)
New order sent. The dispense amount was automatically calculated by Epic, but I corrected it. Thanks!

## 2022-10-22 NOTE — Discharge Instructions (Signed)

## 2022-10-22 NOTE — Telephone Encounter (Signed)
  Summary: directions   Kicko from [place] called in needing directions for refill of enoxaparin (LOVENOX) 100 MG/ML injection          Called pharmacy and pharmacists requesting to reorder or give verbal order to allow dispense of 8 mls instead of 6 mls for lovenox due to dose of .7 mls to be given every 12 hours  will not be enough x 4 days only dispensing 6 mls. Please advise and call pharmacy or reorder medication.     Reason for Disposition  [1] Caller has URGENT medicine question about med that PCP or specialist prescribed AND [2] triager unable to answer question  Answer Assessment - Initial Assessment Questions 1. NAME of MEDICINE: "What medicine(s) are you calling about?"     lovenox 2. QUESTION: "What is your question?" (e.g., double dose of medicine, side effect)     Pharmacy calling to request if can dispense 8 ml due to how medication comes in vial for 100 mg and order is for .7 ml and will not be enough to dispense 6 ml. 3. PRESCRIBER: "Who prescribed the medicine?" Reason: if prescribed by specialist, call should be referred to that group.     PCP 4. SYMPTOMS: "Do you have any symptoms?" If Yes, ask: "What symptoms are you having?"  "How bad are the symptoms (e.g., mild, moderate, severe)     na 5. PREGNANCY:  "Is there any chance that you are pregnant?" "When was your last menstrual period?"     na  Protocols used: Medication Question Call-A-AH

## 2022-10-26 ENCOUNTER — Ambulatory Visit: Payer: Medicare Other | Admitting: Anesthesiology

## 2022-10-26 ENCOUNTER — Encounter
Admission: RE | Disposition: A | Discharge status: Home / Self Care | Payer: Self-pay | Source: Home / Self Care | Attending: Ophthalmology

## 2022-10-26 ENCOUNTER — Other Ambulatory Visit: Payer: Self-pay

## 2022-10-26 ENCOUNTER — Encounter: Payer: Self-pay | Admitting: Ophthalmology

## 2022-10-26 ENCOUNTER — Ambulatory Visit
Admission: RE | Admit: 2022-10-26 | Discharge: 2022-10-26 | Disposition: A | Discharge status: Home / Self Care | Payer: Medicare Other | Attending: Ophthalmology | Admitting: Ophthalmology

## 2022-10-26 DIAGNOSIS — I2510 Atherosclerotic heart disease of native coronary artery without angina pectoris: Secondary | ICD-10-CM | POA: Insufficient documentation

## 2022-10-26 DIAGNOSIS — Z86711 Personal history of pulmonary embolism: Secondary | ICD-10-CM | POA: Diagnosis not present

## 2022-10-26 DIAGNOSIS — G4730 Sleep apnea, unspecified: Secondary | ICD-10-CM | POA: Insufficient documentation

## 2022-10-26 DIAGNOSIS — Z87891 Personal history of nicotine dependence: Secondary | ICD-10-CM | POA: Insufficient documentation

## 2022-10-26 DIAGNOSIS — H4051X0 Glaucoma secondary to other eye disorders, right eye, stage unspecified: Secondary | ICD-10-CM | POA: Insufficient documentation

## 2022-10-26 DIAGNOSIS — Z8249 Family history of ischemic heart disease and other diseases of the circulatory system: Secondary | ICD-10-CM | POA: Insufficient documentation

## 2022-10-26 DIAGNOSIS — I25119 Atherosclerotic heart disease of native coronary artery with unspecified angina pectoris: Secondary | ICD-10-CM | POA: Diagnosis not present

## 2022-10-26 DIAGNOSIS — H4051X3 Glaucoma secondary to other eye disorders, right eye, severe stage: Secondary | ICD-10-CM | POA: Diagnosis not present

## 2022-10-26 DIAGNOSIS — I714 Abdominal aortic aneurysm, without rupture, unspecified: Secondary | ICD-10-CM | POA: Insufficient documentation

## 2022-10-26 DIAGNOSIS — I10 Essential (primary) hypertension: Secondary | ICD-10-CM | POA: Diagnosis not present

## 2022-10-26 DIAGNOSIS — I255 Ischemic cardiomyopathy: Secondary | ICD-10-CM | POA: Diagnosis not present

## 2022-10-26 DIAGNOSIS — I739 Peripheral vascular disease, unspecified: Secondary | ICD-10-CM | POA: Insufficient documentation

## 2022-10-26 DIAGNOSIS — I252 Old myocardial infarction: Secondary | ICD-10-CM | POA: Insufficient documentation

## 2022-10-26 DIAGNOSIS — E785 Hyperlipidemia, unspecified: Secondary | ICD-10-CM | POA: Insufficient documentation

## 2022-10-26 DIAGNOSIS — Z955 Presence of coronary angioplasty implant and graft: Secondary | ICD-10-CM | POA: Insufficient documentation

## 2022-10-26 HISTORY — PX: AQUEOUS SHUNT: SHX6305

## 2022-10-26 SURGERY — INSERTION, AQUEOUS SHUNT, EYE
Anesthesia: Monitor Anesthesia Care | Site: Eye | Laterality: Right

## 2022-10-26 MED ORDER — FENTANYL CITRATE (PF) 100 MCG/2ML IJ SOLN
INTRAMUSCULAR | Status: DC | PRN
  Administered 2022-10-26: 25 ug via INTRAVENOUS
  Administered 2022-10-26: 50 ug via INTRAVENOUS
  Administered 2022-10-26: 25 ug via INTRAVENOUS

## 2022-10-26 MED ORDER — MIDAZOLAM HCL 2 MG/2ML IJ SOLN
INTRAMUSCULAR | Status: DC | PRN
  Administered 2022-10-26: 2 mg via INTRAVENOUS

## 2022-10-26 MED ORDER — PROPOFOL 10 MG/ML IV BOLUS
INTRAVENOUS | Status: DC | PRN
  Administered 2022-10-26: 30 mg via INTRAVENOUS
  Administered 2022-10-26 (×4): 10 mg via INTRAVENOUS

## 2022-10-26 MED ORDER — LACTATED RINGERS IV SOLN: INTRAVENOUS | Status: DC

## 2022-10-26 MED ORDER — LIDOCAINE HCL (PF) 4 % IJ SOLN
INTRAMUSCULAR | Status: DC | PRN
  Administered 2022-10-26: 4 mL via OPHTHALMIC

## 2022-10-26 MED ORDER — NEOMYCIN-POLYMYXIN-DEXAMETH 0.1 % OP OINT
TOPICAL_OINTMENT | OPHTHALMIC | Status: DC | PRN
  Administered 2022-10-26: 1 via OPHTHALMIC

## 2022-10-26 MED ORDER — ARMC OPHTHALMIC DILATING DROPS: 1.0000 | OPHTHALMIC | Status: DC | PRN

## 2022-10-26 MED ORDER — MOXIFLOXACIN HCL 0.5 % OP SOLN
OPHTHALMIC | Status: DC | PRN
  Administered 2022-10-26: .2 mL via OPHTHALMIC

## 2022-10-26 MED ORDER — TETRACAINE HCL 0.5 % OP SOLN
1.0000 [drp] | OPHTHALMIC | Status: AC | PRN
  Administered 2022-10-26 (×3): 1 [drp] via OPHTHALMIC

## 2022-10-26 MED ORDER — SIGHTPATH DOSE#1 BSS IO SOLN
INTRAOCULAR | Status: DC | PRN
  Administered 2022-10-26: 15 mL

## 2022-10-26 SURGICAL SUPPLY — 34 items
ALLOGRAFT TUTOPLST SCER0.5X1.0 (Tissue) IMPLANT
BLADE SURG MINI STRL (BLADE) ×1 IMPLANT
CANNULA ANT/CHMB 27G (MISCELLANEOUS) ×1 IMPLANT
CANNULA ANT/CHMB 27GA (MISCELLANEOUS) ×1 IMPLANT
CUP MEDICINE 2OZ PLAST GRAD ST (MISCELLANEOUS) ×2 IMPLANT
ERASER HMR WETFIELD 18G (MISCELLANEOUS) ×1 IMPLANT
GLOVE SURG GAMMEX PI TX LF 7.5 (GLOVE) ×1 IMPLANT
GLOVE SURG SYN 8.5  E (GLOVE) ×1
GLOVE SURG SYN 8.5 E (GLOVE) ×1 IMPLANT
GLOVE SURG SYN 8.5 PF PI (GLOVE) ×1 IMPLANT
GOWN STRL REUS W/ TWL LRG LVL3 (GOWN DISPOSABLE) ×2 IMPLANT
GOWN STRL REUS W/TWL LRG LVL3 (GOWN DISPOSABLE) ×2
KNIFE SIDECUT EYE (MISCELLANEOUS) IMPLANT
MARKER SKIN DUAL TIP RULER LAB (MISCELLANEOUS) ×1 IMPLANT
NDL FILTER BLUNT 18X1 1/2 (NEEDLE) ×1 IMPLANT
NDL HYPO 27GX1-1/4 (NEEDLE) ×1 IMPLANT
NDL RETROBULBAR 25GX1.5 STRL (NEEDLE) ×1 IMPLANT
NEEDLE FILTER BLUNT 18X1 1/2 (NEEDLE) ×1 IMPLANT
NEEDLE HYPO 27GX1-1/4 (NEEDLE) ×1 IMPLANT
PROTECTOR LASIK FLAP (MISCELLANEOUS) ×1 IMPLANT
SPONGE SURG I SPEAR (MISCELLANEOUS) ×1 IMPLANT
SUT ETHILON 5-0 FS-2 18 BLK (SUTURE) IMPLANT
SUT ETHILON 9-0 (SUTURE) ×1 IMPLANT
SUT VIC AB 4-0 RB1 27 (SUTURE) ×1
SUT VIC AB 4-0 RB1 27X BRD (SUTURE) IMPLANT
SUT VICRYL  9 0 (SUTURE) ×1
SUT VICRYL 8 0 BV 130 5 (SUTURE) IMPLANT
SUT VICRYL 9 0 (SUTURE) ×1 IMPLANT
SYR 10ML LL (SYRINGE) ×1 IMPLANT
SYR 3ML LL SCALE MARK (SYRINGE) ×1 IMPLANT
SYR TB 1ML 27GX1/2 LL (SYRINGE) ×1 IMPLANT
TUTOPLAST SCIERA 0.5X1.0 (Tissue) ×1 IMPLANT
VALVE GLAUCOMA AHMED (Prosthesis & Implant Heart) IMPLANT
WIPE NON LINTING 3.25X3.25 (MISCELLANEOUS) ×1 IMPLANT

## 2022-10-26 NOTE — Anesthesia Postprocedure Evaluation (Signed)
Anesthesia Post Note  Patient: Brian [name]  Procedure(s) Performed: AHMED TUBE SHUNT WITH TUTOPLAST RIGHT (Right: Eye)  Patient location during evaluation: PACU Anesthesia Type: MAC Level of consciousness: awake and alert Pain management: pain level controlled Vital Signs Assessment: post-procedure vital signs reviewed and stable Respiratory status: spontaneous breathing, nonlabored ventilation, respiratory function stable and patient connected to nasal cannula oxygen Cardiovascular status: stable and blood pressure returned to baseline Postop Assessment: no apparent nausea or vomiting Anesthetic complications: no   No notable events documented.   Last Vitals:  Vitals:   10/26/22 1428 10/26/22 1431  BP: 115/76 121/83  Pulse: (!) 55 (!) 59  Resp: 14 15  Temp: (!) 36.4 C   SpO2: 94% 93%    Last Pain:  Vitals:   10/26/22 1428  TempSrc:   PainSc: 0-No pain                 Starlett Pehrson C Nataniel Gasper

## 2022-10-26 NOTE — H&P (Addendum)
[facility]   Primary Care Physician:  Erasmo Downer, MD Ophthalmologist: Dr. Willey Blade  Pre-Procedure History & Physical: HPI:  Brian [name] is a 82 y.o. male here for ahmed tube (glaucoma) surgery.   Past Medical History:  Diagnosis Date   AAA (abdominal aortic aneurysm)    Adenomatous polyp    Anxiety    Carpal tunnel syndrome on left    Cervical spine disease    Colitis    Coronary artery disease    S/p anterior MI tx with PCI in 1998  //  Myoview 6/22: EF 45, ant and septal scar; no ischemia; intermediate risk // Echocardiogram 7/22: EF 45-50, ant-sept and apical HK, Gr 1 DD, normal RVSF, mild MR   Deviated septum    Hyperlipidemia    Hypertension    Ischemic cardiomyopathy 05/19/2022   Echocardiogram 06/02/22: EF 45-50, global HK, Gr 1 DD, NL RVSF, NL PASP (RVSP 26.4), trivial MR, RAP 3   Low back pain    Myocardial infarction 1998   Personal history of pulmonary embolism    Pulmonary embolism 08/26/2022   Sleep apnea    CPAP   Wears dentures    full upper    Past Surgical History:  Procedure Laterality Date   APPENDECTOMY     bunions     CATARACT EXTRACTION     COLONOSCOPY WITH PROPOFOL N/A 02/19/2015   Procedure: COLONOSCOPY WITH PROPOFOL;  Surgeon: Midge Minium, MD;  Location: [facility] ENDOSCOPY;  Service: Endoscopy;  Laterality: N/A;   COLONOSCOPY WITH PROPOFOL N/A 06/05/2019   Procedure: COLONOSCOPY WITH BIOPSY;  Surgeon: Midge Minium, MD;  Location: [facility];  Service: Endoscopy;  Laterality: N/A;  sleep apnea   ERCP N/A 08/25/2022   Procedure: ENDOSCOPIC RETROGRADE CHOLANGIOPANCREATOGRAPHY (ERCP);  Surgeon: Midge Minium, MD;  Location: [facility] ENDOSCOPY;  Service: Endoscopy;  Laterality: N/A;   HEMORRHOID BANDING     HERNIA REPAIR     x3   OLECRANON BURSECTOMY Right    POLYPECTOMY N/A 06/05/2019   Procedure: POLYPECTOMY;  Surgeon: Midge Minium, MD;  Location: [facility];  Service: Endoscopy;  Laterality: N/A;    Prior  to Admission medications   Medication Sig Start Date End Date Taking? Authorizing Provider  apixaban (ELIQUIS) 5 MG TABS tablet Take 1 tablet (5 mg total) by mouth 2 (two) times daily. 09/22/22  Yes Bacigalupo, Marzella Schlein, MD  aspirin 81 MG tablet Take 81 mg by mouth at bedtime.   Yes [provider]  atorvastatin (LIPITOR) 40 MG tablet Take 40 mg by mouth at bedtime.   Yes [provider]  Cholecalciferol (VITAMIN D3) 50 MCG (2000 UT) CAPS Take by mouth daily.   Yes [provider]  Coenzyme Q10 (COQ10) 100 MG CAPS Take 1 capsule by mouth daily.     Yes [provider]  enoxaparin (LOVENOX) 100 MG/ML injection Inject 0.7 mLs (70 mg total) into the skin every 12 (twelve) hours for 4 days. 10/22/22 10/26/22 Yes Bacigalupo, Marzella Schlein, MD  latanoprost (XALATAN) 0.005 % ophthalmic solution Place 1 drop into the right eye at bedtime. 03/04/22  Yes [provider]  lisinopril (ZESTRIL) 2.5 MG tablet Take 1 tablet by mouth daily 12 hours apart from Metoprolol 09/24/22  Yes Weaver, Scott T, PA-C  meclizine (ANTIVERT) 25 MG tablet Take 1 tablet (25 mg total) by mouth 2 (two) times daily as needed for dizziness. 08/29/22  Yes Sreenath, Sudheer B, MD  metoprolol succinate (TOPROL-XL) 25 MG 24 hr  tablet TAKE 1 TABLET(25 MG) BY MOUTH DAILY Patient taking differently: Take 25 mg by mouth at bedtime. 08/18/22  Yes Weaver, Scott T, PA-C  Multiple Vitamin (MULTIVITAMIN PO) Take 1 tablet by mouth daily.   Yes [provider]  nitroGLYCERIN (NITROSTAT) 0.4 MG SL tablet Place 0.4 mg under the tongue every 5 (five) minutes as needed for chest pain.   Yes [provider]  Oxycodone HCl 10 MG TABS Take 1-2 tablets (10-20 mg total) by mouth every 6 (six) hours as needed (moderate to severe pain). 09/22/22  Yes Bacigalupo, Marzella SchleinAngela M, MD  saccharomyces boulardii (FLORASTOR) 250 MG capsule Take 250 mg by mouth 2 (two) times daily.   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  Tobacco Use   Smoking status: Former    Packs/day: 0.50    Years: 60.00    Additional pack years: 0.00    Total pack years: 30.00    Types: Cigarettes    Quit date: 08/26/2022    Years since quitting: 0.1   Smokeless tobacco: Never   Tobacco comments:    1/2 3/4 ppd (since age 82)  Vaping Use   Vaping Use: Never used  Substance and Sexual Activity   Alcohol use: Not Currently    Comment: occasionally, "none   Drug use: No   Sexual activity: Yes    Partners: Female  Other Topics Concern   Not on file  Social History Narrative   Not on file   Social Determinants of Health   Financial Resource Strain: Not on file  Food Insecurity: No Food Insecurity (08/27/2022)   Hunger Vital Sign    Worried About Running Out of Food in the Last Year: Never true    Ran Out of Food in the Last Year: Never true  Transportation Needs: No Transportation Needs (08/27/2022)   PRAPARE - Administrator, Civil ServiceTransportation    Lack of Transportation (Medical): No    Lack of Transportation (Non-Medical): No  Physical Activity: Not on file  Stress: Not on file  Social Connections: Not on file  Intimate Partner Violence: Not At Risk (08/27/2022)   Humiliation, Afraid, Rape, and Kick questionnaire    Fear of Current or Ex-Partner: No    Emotionally Abused: No    Physically Abused: No    Sexually Abused: No    Review of Systems: See HPI, otherwise negative  ROS  Physical Exam: BP (!) 154/96   Pulse (!) 52   Temp 97.9 F (36.6 C) (Temporal)   Resp 20   Ht 5\' 9"  (1.753 m)   Wt 64 kg   SpO2 97%   BMI 20.82 kg/m  General:   Alert, cooperative in NAD Head:  Normocephalic and atraumatic. Respiratory:  Normal work of breathing. Cardiovascular:  RRR  Impression/Plan: Brian [name] is here for cataract surgery.  Risks, benefits, limitations, and alternatives regarding tube shunt surgery have been reviewed with the patient.  Questions have been answered.  All parties agreeable.   Willey Blade, MD  10/26/2022, 1:01 PM

## 2022-10-26 NOTE — Op Note (Signed)
  OPERATIVE NOTE  DEMERIUS GALIOTO [id number] 10/26/2022   PREOPERATIVE DIAGNOSIS:  Neovascular glaucoma, right eye.  H40.51X3   POSTOPERATIVE DIAGNOSIS:    same   PROCEDURE:  CPT 66180 Ahmed tube shunt and placement of tutoplast, right eye.    IMPLANT: Implant Name Type Inv. Item Serial No. Manufacturer Lot No. LRB No. Used Action  VALVE GLAUCOMA AHMED - [id number] Prosthesis & Implant Heart VALVE GLAUCOMA AHMED [id number] [employer] [id number] Right 1 Implanted  TUTOPLAST SCIERA 0.5X1.0 - [id number] Tissue TUTOPLAST SCIERA 0.5X1.0 [id number] [place] PRODUCTS [id number] Right 1 Implanted       Procedure(s): AHMED TUBE SHUNT WITH TUTOPLAST RIGHT (Right)  SURGEON:  Willey Blade, MD, MPH  ANESTHESIOLOGIST: Anesthesiologist: Marisue Humble, MD CRNA: [place], [place], CRNA   ANESTHESIA:  MAC and retrobulbar block with 50%/50% mix of 0.75% bupivicaine and 2% preservative free lidocaine with a small amount of vitrase.  ESTIMATED BLOOD LOSS: <1 mL   COMPLICATIONS:  None.   DESCRIPTION OF PROCEDURE:  The patient was identified in the holding room and transported to the operating room and placed in the supine position.  A time out was called identifiying the right eye as the operative eye and a retrobulbar block was administered in the standard fashion.  It was prepped and draped in the usual sterile ophthalmic fashion.  A marking pen was used to mark the 12:00 and 9:00 positions at the limbus.   A 5-0 vicryl traction suture was placed through the cornea and the eye rotated to expose the superotemporal quadrant.  A corneal sponge was placed to keep the cornea moist.  The conj was dissected from the limbus and posteriorly.  Radial relaxing incisions were made in the conjunctiva.  A 19 gauge pencil cautery was used to obtain hemostasis for the exposed conjunctiva.  The calipers were set to 8.5 mm and the sclera was marked posterior to the limbus. The Ahmed tube shunt was brought onto the field  and was primed with BSS on a 27 gauge cannula.  Visualization of the fluid from the plate was confirmed. The plate was secured with two 9-0 nylon on a spatulated needle with partial thickness bites throught the sclera at the premarked sites and the sutures were buried in the eyelets of the plate.  The tube was trimmed to the appropriate length with an anterior bevel with westcott scissors.  A 23 gauge needle was used to create a short scleral tunnel and enter into the anterior chamber parallel to the iris.  The tube was inserted with tube inserter forceps and was in good position, not contacting the cornea or iris.  The tutoplast was brought onto the field and trimmed and secured using 8-0 vicryl sutures.  The conjunctiva was reapproximated to the limbus with an 8-0 vicyrl suture running to the reapproximate the relaxing incisions at both 9:00 and 12:00 positions.   The lid speculum was removed, the face was cleaned with a wet and dry.  Maxitrol ointment, a patch and shield were placed over the left eye.   The patient was taken to the recovery room in stable condition without complications of anesthesia or surgery.  The patient is to leave the patch and shield in place and we will see them in the clinic for followup tomorrow.

## 2022-10-26 NOTE — Transfer of Care (Signed)
Immediate Anesthesia Transfer of Care Note  Patient: Brian [name]  Procedure(s) Performed: AHMED TUBE SHUNT WITH TUTOPLAST RIGHT (Right: Eye)  Patient Location: PACU  Anesthesia Type: MAC  Level of Consciousness: awake, alert  and patient cooperative  Airway and Oxygen Therapy: Patient Spontanous Breathing and Patient connected to supplemental oxygen  Post-op Assessment: Post-op Vital signs reviewed, Patient's Cardiovascular Status Stable, Respiratory Function Stable, Patent Airway and No signs of Nausea or vomiting  Post-op Vital Signs: Reviewed and stable  Complications: No notable events documented.

## 2022-10-28 ENCOUNTER — Encounter: Payer: Self-pay | Admitting: Ophthalmology

## 2022-11-01 ENCOUNTER — Other Ambulatory Visit: Payer: Self-pay | Admitting: Family Medicine

## 2022-11-02 DIAGNOSIS — G4733 Obstructive sleep apnea (adult) (pediatric): Secondary | ICD-10-CM | POA: Diagnosis not present

## 2022-11-02 MED ORDER — OXYCODONE HCL 10 MG PO TABS: 10.0000 mg | ORAL_TABLET | Freq: Four times a day (QID) | ORAL | 0 refills | Status: DC | PRN

## 2022-11-03 ENCOUNTER — Encounter: Payer: Self-pay | Admitting: Ophthalmology

## 2022-11-04 DIAGNOSIS — Z79899 Other long term (current) drug therapy: Secondary | ICD-10-CM

## 2022-11-04 MED ORDER — LISINOPRIL 2.5 MG PO TABS: 2.5000 mg | ORAL_TABLET | Freq: Two times a day (BID) | ORAL | 3 refills | Status: DC

## 2022-11-04 NOTE — Telephone Encounter (Signed)
Increase Lisinopril to 2.5 mg twice daily  BMET 1 month. Tereso Newcomer, PA-C    11/04/2022 3:46 PM

## 2022-11-09 ENCOUNTER — Telehealth: Payer: Self-pay | Admitting: Family Medicine

## 2022-11-09 MED ORDER — ATORVASTATIN CALCIUM 40 MG PO TABS: 40.0000 mg | ORAL_TABLET | Freq: Every day | ORAL | 1 refills | Status: DC

## 2022-11-09 NOTE — Telephone Encounter (Signed)
[facility] pharmacy faxed refill request for the following medications:   atorvastatin (LIPITOR) 40 MG tablet    Please advise

## 2022-11-12 ENCOUNTER — Ambulatory Visit: Payer: Medicare Other | Admitting: Neurosurgery

## 2022-11-16 DIAGNOSIS — H4051X3 Glaucoma secondary to other eye disorders, right eye, severe stage: Secondary | ICD-10-CM | POA: Diagnosis not present

## 2022-11-16 DIAGNOSIS — G4733 Obstructive sleep apnea (adult) (pediatric): Secondary | ICD-10-CM | POA: Diagnosis not present

## 2022-11-16 DIAGNOSIS — G4730 Sleep apnea, unspecified: Secondary | ICD-10-CM | POA: Diagnosis not present

## 2022-11-16 DIAGNOSIS — H4311 Vitreous hemorrhage, right eye: Secondary | ICD-10-CM | POA: Diagnosis not present

## 2022-11-16 NOTE — Progress Notes (Unsigned)
Referring Physician:  Erasmo Downer, MD [address] [place],  [place] [address]  Primary Physician:  Erasmo Downer, MD  History of Present Illness: 11/16/2022 [name] is here today with a chief complaint of an incidentally found aneurysm.  He began having lightheadedness approximately 6 months ago.  This is still occurring.  He had some trouble managing his blood pressure.  He has no history worrisome for aneurysmal disease.  He has no family history.  He quit smoking in February 2024.   Past Surgery: denies   The symptoms are causing a significant impact on the patient's life.   I have utilized the care everywhere function in epic to review the outside records available from external health systems.  Review of Systems:  A 10 point review of systems is negative, except for the pertinent positives and negatives detailed in the HPI.  Past Medical History: Past Medical History:  Diagnosis Date   AAA (abdominal aortic aneurysm) (HCC)    Adenomatous polyp    Anxiety    Carpal tunnel syndrome on left    Cervical spine disease    Colitis    Coronary artery disease    S/p anterior MI tx with PCI in 1998  //  Myoview 6/22: EF 45, ant and septal scar; no ischemia; intermediate risk // Echocardiogram 7/22: EF 45-50, ant-sept and apical HK, Gr 1 DD, normal RVSF, mild MR   Deviated septum    Hyperlipidemia    Hypertension    Ischemic cardiomyopathy 05/19/2022   Echocardiogram 06/02/22: EF 45-50, global HK, Gr 1 DD, NL RVSF, NL PASP (RVSP 26.4), trivial MR, RAP 3   Low back pain    Myocardial infarction (HCC) 1998   Personal history of pulmonary embolism    Pulmonary embolism (HCC) 08/26/2022   Sleep apnea    CPAP   Wears dentures    full upper    Past Surgical History: Past Surgical History:  Procedure Laterality Date   APPENDECTOMY     AQUEOUS SHUNT Right 10/26/2022   Procedure: AHMED TUBE SHUNT WITH TUTOPLAST RIGHT;  Surgeon: Nevada Crane, MD;  Location: [facility];  Service: Ophthalmology;  Laterality: Right;   bunions     CATARACT EXTRACTION     COLONOSCOPY WITH PROPOFOL N/A 02/19/2015   Procedure: COLONOSCOPY WITH PROPOFOL;  Surgeon: Midge Minium, MD;  Location: [facility] ENDOSCOPY;  Service: Endoscopy;  Laterality: N/A;   COLONOSCOPY WITH PROPOFOL N/A 06/05/2019   Procedure: COLONOSCOPY WITH BIOPSY;  Surgeon: Midge Minium, MD;  Location: [facility];  Service: Endoscopy;  Laterality: N/A;  sleep apnea   ERCP N/A 08/25/2022   Procedure: ENDOSCOPIC RETROGRADE CHOLANGIOPANCREATOGRAPHY (ERCP);  Surgeon: Midge Minium, MD;  Location: [facility] ENDOSCOPY;  Service: Endoscopy;  Laterality: N/A;   HEMORRHOID BANDING     HERNIA REPAIR     x3   OLECRANON BURSECTOMY Right    POLYPECTOMY N/A 06/05/2019   Procedure: POLYPECTOMY;  Surgeon: Midge Minium, MD;  Location: [facility];  Service: Endoscopy;  Laterality: N/A;    Allergies: Allergies as of 11/17/2022   (No Known Allergies)    Medications:  Current Outpatient Medications:    apixaban (ELIQUIS) 5 MG TABS tablet, Take 1 tablet (5 mg total) by mouth 2 (two) times daily., Disp: 180 tablet, Rfl: 0   aspirin 81 MG tablet, Take 81 mg by mouth at bedtime., Disp: , Rfl:    atorvastatin (LIPITOR) 40 MG tablet, Take 1 tablet (40 mg  total) by mouth at bedtime. Take 40 mg by mouth at bedtime., Disp: 90 tablet, Rfl: 1   Cholecalciferol (VITAMIN D3) 50 MCG (2000 UT) CAPS, Take by mouth daily., Disp: , Rfl:    Coenzyme Q10 (COQ10) 100 MG CAPS, Take 1 capsule by mouth daily.  , Disp: , Rfl:    enoxaparin (LOVENOX) 100 MG/ML injection, Inject 0.7 mLs (70 mg total) into the skin every 12 (twelve) hours for 4 days., Disp: 8 mL, Rfl: 0   latanoprost (XALATAN) 0.005 % ophthalmic solution, Place 1 drop into the right eye at bedtime., Disp: , Rfl:    lisinopril (ZESTRIL) 2.5 MG tablet, Take 1 tablet (2.5 mg total) by mouth in the morning and at bedtime., Disp: 180  tablet, Rfl: 3   meclizine (ANTIVERT) 25 MG tablet, Take 1 tablet (25 mg total) by mouth 2 (two) times daily as needed for dizziness., Disp: 30 tablet, Rfl: 0   metoprolol succinate (TOPROL-XL) 25 MG 24 hr tablet, TAKE 1 TABLET(25 MG) BY MOUTH DAILY (Patient taking differently: Take 25 mg by mouth at bedtime.), Disp: 30 tablet, Rfl: 11   Multiple Vitamin (MULTIVITAMIN PO), Take 1 tablet by mouth daily., Disp: , Rfl:    nitroGLYCERIN (NITROSTAT) 0.4 MG SL tablet, Place 0.4 mg under the tongue every 5 (five) minutes as needed for chest pain., Disp: , Rfl:    Oxycodone HCl 10 MG TABS, Take 1-2 tablets (10-20 mg total) by mouth every 6 (six) hours as needed (moderate to severe pain)., Disp: 120 tablet, Rfl: 0   saccharomyces boulardii (FLORASTOR) 250 MG capsule, Take 250 mg by mouth 2 (two) times daily., Disp: , Rfl:  No current facility-administered medications for this visit.  Facility-Administered Medications Ordered in Other Visits:    albuterol (PROVENTIL) (2.5 MG/3ML) 0.083% nebulizer solution 2.5 mg, 2.5 mg, Nebulization, Once, Stoneking, Hal, MD  Social History: Social History   Tobacco Use   Smoking status: Former    Packs/day: 0.50    Years: 60.00    Additional pack years: 0.00    Total pack years: 30.00    Types: Cigarettes    Quit date: 08/26/2022    Years since quitting: 0.2   Smokeless tobacco: Never   Tobacco comments:    1/2 3/4 ppd (since age 55)  Vaping Use   Vaping Use: Never used  Substance Use Topics   Alcohol use: Not Currently    Comment: occasionally, "none   Drug use: No    Family Medical History: Family History  Problem Relation Age of Onset   Heart failure Mother    Kidney disease Father    Hypertension Father    Coronary artery disease Brother    Alzheimer's disease Maternal Grandmother    Colon cancer Maternal Grandfather     Physical Examination: There were no vitals filed for this visit.  General: Patient is well developed, well nourished,  calm, collected, and in no apparent distress. Attention to examination is appropriate.  Neck:   Supple.  Full range of motion.  Respiratory: Patient is breathing without any difficulty.   NEUROLOGICAL:     Awake, alert, oriented to person, place, and time.  Speech is clear and fluent.   Cranial Nerves: Pupils equal round and reactive to light.  Facial tone is symmetric.  Facial sensation is symmetric. Shoulder shrug is symmetric. Tongue protrusion is midline.  There is no pronator drift.  ROM of spine: full.    Strength: Side Biceps Triceps Deltoid Interossei Grip Wrist Ext. Wrist  Flex.  R 5 5 5 5 5 5 5   L 5 5 5 5 5 5 5    Side Iliopsoas Quads Hamstring PF DF EHL  R 5 5 5 5 5 5   L 5 5 5 5 5 5    Reflexes are 1+ and symmetric at the biceps, triceps, brachioradialis, patella and achilles.   Hoffman's is absent.   Bilateral upper and lower extremity sensation is intact to light touch.    No evidence of dysmetria noted.  Gait is normal.     Medical Decision Making  Imaging: MRIA Head 10/06/2022 IMPRESSION: MRA HEAD IMPRESSION:   1. Negative intracranial MRA for large vessel occlusion. No hemodynamically significant or correctable stenosis. No significant beading or irregularity to suggest underlying vasculopathy by MRA. 2. 4 mm cavernous right ICA aneurysm.   MRA NECK IMPRESSION:   1. Atheromatous change about the carotid bifurcations with up to 40% stenosis bilaterally. 2. Short-segment 40% stenosis involving the proximal left subclavian artery. 3. Otherwise wide patency of both vertebral arteries within the neck. No evidence for dissection or vasculopathy by MRA.     Electronically Signed   By: Rise Mu M.D.   On: 10/09/2022 22:21    I have personally reviewed the images and agree with the above interpretation.  Assessment and Plan: [name] is a pleasant 82 y.o. 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  He has no signs of  this impacting him currently.  We discussed that this aneurysm is not within the subarachnoid space, so cannot cause subarachnoid hemorrhage.  If it did rupture, it would likely cause double vision due to abnormal eye movements of the right eye.  That is very unlikely to happen.  He should keep away from tobacco utilization, as this has been shown to make aneurysms worse.  Will rescan and confirm stability of this aneurysm and in 3 to 6 months.  If it is stable at that time, we will likely move to expectant management.  He will see Dr. Sherryll Tengan tomorrow.  I deferred further management and decision making regarding the white matter changes on his MRI scan to Dr. Sherryll Glaude.  I spent a total of 30 minutes in this patient's care today. This time was spent reviewing pertinent records including imaging studies, obtaining and confirming history, performing a directed evaluation, formulating and discussing my recommendations, and documenting the visit within the medical record.      Thank you for involving me in the care of this patient.      Gala Padovano K. Myer Haff MD, [facility] Neurosurgery

## 2022-11-17 ENCOUNTER — Ambulatory Visit: Payer: Medicare Other | Admitting: Neurosurgery

## 2022-11-17 ENCOUNTER — Encounter: Payer: Self-pay | Admitting: Neurosurgery

## 2022-11-17 VITALS — BP 139/87 | HR 63 | Ht 69.0 in | Wt 161.2 lb

## 2022-11-17 DIAGNOSIS — I671 Cerebral aneurysm, nonruptured: Secondary | ICD-10-CM

## 2022-11-18 ENCOUNTER — Encounter: Payer: Self-pay | Admitting: Family Medicine

## 2022-11-18 ENCOUNTER — Other Ambulatory Visit: Payer: Self-pay | Admitting: *Deleted

## 2022-11-18 DIAGNOSIS — I10 Essential (primary) hypertension: Secondary | ICD-10-CM

## 2022-11-18 DIAGNOSIS — R42 Dizziness and giddiness: Secondary | ICD-10-CM | POA: Diagnosis not present

## 2022-11-18 DIAGNOSIS — G629 Polyneuropathy, unspecified: Secondary | ICD-10-CM | POA: Diagnosis not present

## 2022-11-18 NOTE — Telephone Encounter (Signed)
HTN clinic 5/6 at 1:30 pm.

## 2022-11-23 ENCOUNTER — Ambulatory Visit: Payer: Medicare Other | Attending: Internal Medicine | Admitting: Pharmacist

## 2022-11-23 VITALS — BP 130/82 | HR 64

## 2022-11-23 DIAGNOSIS — I10 Essential (primary) hypertension: Secondary | ICD-10-CM | POA: Diagnosis not present

## 2022-11-23 NOTE — Progress Notes (Signed)
Patient ID: Brian [name]                 DOB: 07/04/1941                      MRN: [id number]     HPI: Brian [name] is a 82 y.o. male referred by Brian [name] to HTN clinic, formerly a pt of Brian [name]. 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 Last saw Brian Brian [name] in December 2023. Noted that with pt's left subclavian stenosis, right arm blood pressures should drive therapy. Was also undergoing evaluation for dizziness at that time. Hospitalized in February with sepsis following ERCP and PE, had issues with low BP after and lisinopril was held. He was started on Diamox in March for vision loss by his optometrist and had low BP readings after. Diamox was then stopped 3/27. Pt sent in elevated BP readings at the end of March up to 170s/100s. His lisinopril was increased to 2.5mg  BID. His optometrist then resumed Diamox 10/20/22 so his lisinopril was decreased back to once daily dosing. Diamox then stopped 4/10 after eye surgery. Lisinopril increased to 2.5mg  BID on 4/17 due to elevated BP. BP remained elevated 140-160s however was also experiencing lows with SBP in the 80s-90s. Brian [name] has followed pt's BP very closely. Pt was referred to Brian [name] to make sure his home cuff is measuring accurately. He was advised to take lisinopril 5mg  at 7am and Toprol at 7pm, and to check his BP only in his right arm 2-3 hours after morning meds.   Pt presents today for follow up. He quit smoking in February when he was hospitalized. Has a faint headache when his BP runs higher. Still experiencing dizziness that is unrelated to his BP, saw neurology last week, no clear cause identified. Has not had any falls. 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 No plans to resume Diamox. 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 His daughter is an Brian [name] at [employer]. Last low  BP reading was a week ago - 92/58. High on Saturday was 168/97.  Got a new [employer] bicep cuff a few months ago: 5/1: 126/82, HR 69 - AM before meds 5/2: 139/84, HR 69 - AM before meds        129/84, HR 58 - noon after meds 5/3: 154/88, HR 76 - AM before meds        116/69, HR 58 - noon after meds 5/4: 168/97, HR 80 - AM before meds        111/66, HR 68 - noon after meds 5/5: 132/84, HR 62 - noon after meds 5/6: 161/94, HR 68 - AM before meds        126/82, HR 61 - noon after meds  Clinic readings: Home cuff: 142/89, recheck 122/81, recheck 129/84 Clinic manual: 140/82, recheck 140/80, recheck 130/82  Current HTN meds: lisinopril 5mg  AM, Toprol 25mg  PM  BP goal: <130/93mmHg  Family History: Mother with HF, father with CKD and HTN, brother with CAD.  Social History: 1/2 PPD x 60 years, occasional alcohol use, no drug use.  Labs: 11/18/22: Na 135, K 4.7, SCr 1.1  Wt Readings from Last 3 Encounters:  11/17/22 161 lb 3.2 oz (73.1 kg)  10/26/22 141 lb (64 kg)  10/19/22 156 lb (70.8 kg)  BP Readings from Last 3 Encounters:  11/17/22 139/87  10/26/22 121/83  10/19/22 (!) 144/75   Pulse Readings from Last 3 Encounters:  11/17/22 63  10/26/22 (!) 59  10/19/22 72    Renal function: CrCl cannot be calculated (Patient's most recent lab result is older than the maximum 21 days allowed.).  Past Medical History:  Diagnosis Date   AAA (abdominal aortic aneurysm) (HCC)    Adenomatous polyp    Anxiety    Carpal tunnel syndrome on left    Cervical spine disease    Colitis    Coronary artery disease    S/p anterior MI tx with PCI in 1998  //  Myoview 6/22: EF 45, ant and septal scar; no ischemia; intermediate risk // Echocardiogram 7/22: EF 45-50, ant-sept and apical HK, Gr 1 DD, normal RVSF, mild MR   Deviated septum    Hyperlipidemia    Hypertension    Ischemic cardiomyopathy 05/19/2022   Echocardiogram 06/02/22: EF 45-50, global HK, Gr 1 DD, NL RVSF, NL PASP (RVSP 26.4),  trivial MR, RAP 3   Low back pain    Myocardial infarction (HCC) 1998   Personal history of pulmonary embolism    Pulmonary embolism (HCC) 08/26/2022   Sleep apnea    CPAP   Wears dentures    full upper    Current Outpatient Medications on File Prior to Visit  Medication Sig Dispense Refill   apixaban (ELIQUIS) 5 MG TABS tablet Take 1 tablet (5 mg total) by mouth 2 (two) times daily. 180 tablet 0   aspirin 81 MG tablet Take 81 mg by mouth at bedtime.     atorvastatin (LIPITOR) 40 MG tablet Take 1 tablet (40 mg total) by mouth at bedtime. Take 40 mg by mouth at bedtime. 90 tablet 1   Cholecalciferol (VITAMIN D3) 50 MCG (2000 UT) CAPS Take by mouth daily.     Coenzyme Q10 (COQ10) 100 MG CAPS Take 1 capsule by mouth daily.       lisinopril (ZESTRIL) 2.5 MG tablet Take 1 tablet (2.5 mg total) by mouth in the morning and at bedtime. 180 tablet 3   meclizine (ANTIVERT) 25 MG tablet Take 1 tablet (25 mg total) by mouth 2 (two) times daily as needed for dizziness. 30 tablet 0   metoprolol succinate (TOPROL-XL) 25 MG 24 hr tablet TAKE 1 TABLET(25 MG) BY MOUTH DAILY (Patient taking differently: Take 25 mg by mouth at bedtime.) 30 tablet 11   Multiple Vitamin (MULTIVITAMIN PO) Take 1 tablet by mouth daily.     nitroGLYCERIN (NITROSTAT) 0.4 MG SL tablet Place 0.4 mg under the tongue every 5 (five) minutes as needed for chest pain.     Oxycodone HCl 10 MG TABS Take 1-2 tablets (10-20 mg total) by mouth every 6 (six) hours as needed (moderate to severe pain). 120 tablet 0   prednisoLONE acetate (PRED FORTE) 1 % ophthalmic suspension Place 1 drop into the right eye 4 (four) times daily.     saccharomyces boulardii (FLORASTOR) 250 MG capsule Take 250 mg by mouth 2 (two) times daily.     Current Facility-Administered Medications on File Prior to Visit  Medication Dose Route Frequency Provider Last Rate Last Admin   albuterol (PROVENTIL) (2.5 MG/3ML) 0.083% nebulizer solution 2.5 mg  2.5 mg Nebulization  Once Stoneking, Hal, MD        No Known Allergies   Assessment/Plan:  1. Hypertension - Home BP cuff measuring accurately. Goal BP < 130/26mmHg especially given  his aortic aneurysm, small brain aneurysm, and right eye infarct. His BP continues to trend high in the morning right when he wakes up before taking his meds, then is well controlled a few hours after taking his meds. Currently taking lisinopril 5mg  in the AM and Toprol 25mg  in the PM. Discussed that since his BP is well controlled after taking his lisinopril, the dose seems adequate, but he isn't getting 24 hour coverage. Taking 2.5mg  BID of lisinopril didn't help either unfortunately as he noticed the same trends. Will switch timing of these - since he's waking up with elevated BP readings, will have him take lisinopril in the PM to allow med to be fully working when he wakes up in the AM. He'll move his Toprol to the AM. He will send in updated readings in a week. If this doesn't help, discussed replacing lisinopril with a low dose of telmisartan instead which has a longer half life and may provide him with better 24 hour BP coverage.  1 hr spent with pt in clinic.   Tyriq Moragne E. Katherine Syme, Brian [name], BCACP, CPP [facility] [address], [place], [place] [address] Phone: [phone]; Fax: [phone] 11/23/2022 2:28 PM

## 2022-11-23 NOTE — Patient Instructions (Addendum)
Your blood pressure goal is < 130/46mmHg   Move your lisinopril to bedtime and move your Toprol to the morning  Keep an eye on your readings over the next week or so and let us know how they're looking  We can try replacing your lisinopril with telmisartan if needed. This is as similar medication that has a longer half life so it can sometimes provide better 24 hour blood pressure coverage  Important lifestyle changes to control high blood pressure  Intervention  Effect on the BP   Weight loss Weight loss is one of the most effective lifestyle changes for controlling blood pressure. If you're overweight or obese, losing even a small amount of weight can help reduce blood pressure.    Blood pressure can decrease by 1 millimeter of mercury (mmHg) with each kilogram (about 2.2 pounds) of weight lost.   Exercise regularly As a general goal, aim for 30 minutes of moderate physical activity every day.    Regular physical activity can lower blood pressure by 5 - 8 mmHg.   Eat a healthy diet Eat a diet rich in whole grains, fruits, vegetables, lean meat, and low-fat dairy products. Limit processed foods, saturated fat, and sweets.    A heart-healthy diet can lower high blood pressure by 10 mmHg.   Reduce salt (sodium) in your diet Aim for 000mg  of sodium each day. Avoid deli meats, canned food, and frozen microwave meals which are high in sodium.     Limiting sodium can reduce blood pressure by 5 mmHg.   Limit alcohol One drink equals 12 ounces of beer, 5 ounces of wine, or 1.5 ounces of 80-proof liquor.    Limiting alcohol to < 1 drink a day for women or < 2 drinks a day for men can help lower blood pressure by about 4 mmHg.   To check your pressure at home you will need to:   Sit up in a chair, with feet flat on the floor and back supported. Do not cross your ankles or legs. Rest your left arm so that the cuff is about heart level. If the cuff goes on your upper arm, then just  relax your arm on the table, arm of the chair, or your lap. If you have a wrist cuff, hold your wrist against your chest at heart level. Place the cuff snugly around your arm, about 1 inch above the crease of your elbow. The cords should be inside the groove of your elbow.  Sit quietly, with the cuff in place, for about 5 minutes. Then press the power button to start a reading. Do not talk or move while the reading is taking place.  Record your readings on a sheet of paper. Although most cuffs have a memory, it is often easier to see a pattern developing when the numbers are all in front of you.  You can repeat the reading after 1-3 minutes if it is recommended.   Make sure your bladder is empty and you have not had caffeine or tobacco within the last 30 minutes   Always bring your blood pressure log with you to your appointments. If you have not brought your monitor in to be double checked for accuracy, please bring it to your next appointment.   You can find a list of validated (accurate) blood pressure cuffs at: validatebp.org

## 2022-11-30 ENCOUNTER — Encounter: Payer: Self-pay | Admitting: Pharmacist

## 2022-11-30 ENCOUNTER — Ambulatory Visit (INDEPENDENT_AMBULATORY_CARE_PROVIDER_SITE_OTHER): Payer: Medicare Other | Admitting: Family Medicine

## 2022-11-30 VITALS — BP 113/72 | HR 59 | Temp 97.8°F | Resp 16 | Wt 164.0 lb

## 2022-11-30 DIAGNOSIS — M545 Low back pain, unspecified: Secondary | ICD-10-CM

## 2022-11-30 DIAGNOSIS — I10 Essential (primary) hypertension: Secondary | ICD-10-CM | POA: Diagnosis not present

## 2022-11-30 DIAGNOSIS — G8929 Other chronic pain: Secondary | ICD-10-CM

## 2022-11-30 DIAGNOSIS — G894 Chronic pain syndrome: Secondary | ICD-10-CM | POA: Diagnosis not present

## 2022-11-30 DIAGNOSIS — E7800 Pure hypercholesterolemia, unspecified: Secondary | ICD-10-CM

## 2022-11-30 DIAGNOSIS — Z79899 Other long term (current) drug therapy: Secondary | ICD-10-CM

## 2022-11-30 MED ORDER — OXYCODONE HCL 10 MG PO TABS: 10.0000 mg | ORAL_TABLET | Freq: Four times a day (QID) | ORAL | 0 refills | Status: DC | PRN

## 2022-11-30 NOTE — Assessment & Plan Note (Signed)
Well-controlled today, but continues to have morning elevation in blood pressure Plans to continue to work with cardiology and pharmacy on treating medications to have more consistent control No changes to medications today Reviewed recent metabolic panel

## 2022-11-30 NOTE — Progress Notes (Signed)
I,Sulibeya S Dimas,acting as a scribe for Shirlee Latch, MD.,have documented all relevant documentation on the behalf of Shirlee Latch, MD,as directed by  Shirlee Latch, MD while in the presence of Shirlee Latch, MD.   Established patient visit   Patient: Brian [name]   DOB: February 25, 1941   82 y.o. Male  MRN: [id number] Visit Date: 11/30/2022  Today's healthcare provider: Shirlee Latch, MD   Chief Complaint  Patient presents with   Hyperlipidemia   Hypertension   Follow-up   Subjective    Hyperlipidemia Pertinent negatives include no chest pain or shortness of breath.  Hypertension Pertinent negatives include no chest pain, palpitations or shortness of breath.    Hypertension, follow-up  BP Readings from Last 3 Encounters:  11/30/22 113/72  11/23/22 130/82  11/17/22 139/87   Wt Readings from Last 3 Encounters:  11/30/22 164 lb (74.4 kg)  11/17/22 161 lb 3.2 oz (73.1 kg)  10/26/22 141 lb (64 kg)     He was last seen for hypertension 2 months ago.  BP at that visit was 130/85. Management since that visit includes no changes. Has switched his lisinopril to evening and metop to AM He reports excellent compliance with treatment. He is not having side effects.   Outside blood pressures are elevated. 175/100 this morning. BP is elevated in AM  --------------------------------------------------------------------------------------------------- Lipid/Cholesterol, follow-up  Last Lipid Panel: No results found for: "CHOL", "LDLCALC", "LDLDIRECT", "HDL", "TRIG"  He was last seen for this 2 months ago.  Management since that visit includes no changes.  He reports excellent compliance with treatment. He is not having side effects.  Last metabolic panel Lab Results  Component Value Date   GLUCOSE 87 10/16/2022   NA 132 (L) 10/16/2022   K 3.8 10/16/2022   BUN 24 (H) 10/16/2022   CREATININE 0.95 10/16/2022   EGFR 72 09/09/2022   GFRNONAA >60  10/16/2022   CALCIUM 8.8 (L) 10/16/2022   AST 22 09/09/2022   ALT 21 09/09/2022   The ASCVD Risk score (Arnett DK, et al., 2019) failed to calculate for the following reasons:   The 2019 ASCVD risk score is only valid for ages 53 to 34   The patient has a prior MI or stroke diagnosis  --------------------------------------------------------------------------------------------------- Follow up for chronic low back pain  The patient was last seen for this 2 months ago. Changes made at last visit include no changes.  He reports excellent compliance with treatment. He feels that condition is Unchanged. He is not having side effects.   -----------------------------------------------------------------------------------------   Medications: Outpatient Medications Prior to Visit  Medication Sig   apixaban (ELIQUIS) 5 MG TABS tablet Take 1 tablet (5 mg total) by mouth 2 (two) times daily.   aspirin 81 MG tablet Take 81 mg by mouth at bedtime.   atorvastatin (LIPITOR) 40 MG tablet Take 1 tablet (40 mg total) by mouth at bedtime. Take 40 mg by mouth at bedtime.   Cholecalciferol (VITAMIN D3) 50 MCG (2000 UT) CAPS Take by mouth daily.   Coenzyme Q10 (COQ10) 100 MG CAPS Take 1 capsule by mouth daily.     lisinopril (ZESTRIL) 2.5 MG tablet Take 5 mg by mouth daily.   meclizine (ANTIVERT) 25 MG tablet Take 1 tablet (25 mg total) by mouth 2 (two) times daily as needed for dizziness.   metoprolol succinate (TOPROL-XL) 25 MG 24 hr tablet TAKE 1 TABLET(25 MG) BY MOUTH DAILY (Patient taking differently: Take 25 mg by mouth at bedtime.)  Multiple Vitamin (MULTIVITAMIN PO) Take 1 tablet by mouth daily.   nitroGLYCERIN (NITROSTAT) 0.4 MG SL tablet Place 0.4 mg under the tongue every 5 (five) minutes as needed for chest pain.   prednisoLONE acetate (PRED FORTE) 1 % ophthalmic suspension Place 1 drop into the right eye 4 (four) times daily.   saccharomyces boulardii (FLORASTOR) 250 MG capsule Take 250 mg  by mouth 2 (two) times daily.   [DISCONTINUED] Oxycodone HCl 10 MG TABS Take 1-2 tablets (10-20 mg total) by mouth every 6 (six) hours as needed (moderate to severe pain).   Facility-Administered Medications Prior to Visit  Medication Dose Route Frequency Provider   albuterol (PROVENTIL) (2.5 MG/3ML) 0.083% nebulizer solution 2.5 mg  2.5 mg Nebulization Once Stoneking, Hal, MD    Review of Systems  Constitutional:  Positive for appetite change. Negative for fatigue.  Eyes:  Positive for visual disturbance.  Respiratory:  Negative for cough, chest tightness and shortness of breath.   Cardiovascular:  Positive for leg swelling. Negative for chest pain and palpitations.  Musculoskeletal:  Positive for back pain.  Neurological:  Positive for light-headedness.  Psychiatric/Behavioral:  Positive for sleep disturbance.        Objective    BP 113/72 (BP Location: Right Arm, Patient Position: Sitting, Cuff Size: Normal)   Pulse (!) 59   Temp 97.8 F (36.6 C) (Temporal)   Resp 16   Wt 164 lb (74.4 kg)   SpO2 94%   BMI 24.22 kg/m    Physical Exam Vitals reviewed.  Constitutional:      General: He is not in acute distress.    Appearance: Normal appearance. He is not diaphoretic.  HENT:     Head: Normocephalic and atraumatic.  Eyes:     General: No scleral icterus.    Conjunctiva/sclera: Conjunctivae normal.  Cardiovascular:     Rate and Rhythm: Normal rate and regular rhythm.     Pulses: Normal pulses.     Heart sounds: Normal heart sounds. No murmur heard. Pulmonary:     Effort: Pulmonary effort is normal. No respiratory distress.     Breath sounds: Normal breath sounds. No wheezing or rhonchi.  Musculoskeletal:     Cervical back: Neck supple.     Right lower leg: No edema.     Left lower leg: No edema.  Lymphadenopathy:     Cervical: No cervical adenopathy.  Skin:    General: Skin is warm and dry.     Findings: No rash.  Neurological:     Mental Status: He is alert  and oriented to person, place, and time. Mental status is at baseline.  Psychiatric:        Mood and Affect: Mood normal.        Behavior: Behavior normal.       No results found for any visits on 11/30/22.  Assessment & Plan     Problem List Items Addressed This Visit       Cardiovascular and Mediastinum   Hypertension - Primary    Well-controlled today, but continues to have morning elevation in blood pressure Plans to continue to work with cardiology and pharmacy on treating medications to have more consistent control No changes to medications today Reviewed recent metabolic panel        Other   Hyperlipidemia    Previously well controlled Continue statin Repeat FLP and CMP Goal LDL < 70      Relevant Orders   Lipid panel   Low back pain  Chronic low back pain - PDMP reviewed. Was on pain contract with [facility] physicians - maintains function with this dose of oxycodone - will continue - has never tried gabapentin, nortriptyline, or lyrica - will consider at next visit - tried RFA and ESIs, PT multiple times with Dr Regino Schultze in [facility] without relief. Pain contract reviewed and refill sent to pharmacy      Relevant Medications   Oxycodone HCl 10 MG TABS   Chronic pain syndrome    Back pain as above Signed pain contract Continue oxycodone at current dose      Relevant Medications   Oxycodone HCl 10 MG TABS     Return in about 6 months (around 06/02/2023) for CPE, AWV after 11/17 and 8m pain f/u (virtual ok).      I, Shirlee Latch, MD, have reviewed all documentation for this visit. The documentation on 11/30/22 for the exam, diagnosis, procedures, and orders are all accurate and complete.   Georgine Wiltse, Marzella Schlein, MD, MPH [facility]

## 2022-11-30 NOTE — Assessment & Plan Note (Signed)
Chronic low back pain - PDMP reviewed. Was on pain contract with [facility] physicians - maintains function with this dose of oxycodone - will continue - has never tried gabapentin, nortriptyline, or lyrica - will consider at next visit - tried RFA and ESIs, PT multiple times with Dr Regino Schultze in [facility] [place] without relief. Pain contract reviewed and refill sent to pharmacy

## 2022-11-30 NOTE — Assessment & Plan Note (Signed)
Back pain as above Signed pain contract Continue oxycodone at current dose 

## 2022-11-30 NOTE — Assessment & Plan Note (Signed)
Previously well controlled Continue statin Repeat FLP and CMP Goal LDL < 70 

## 2022-12-01 LAB — LIPID PANEL
Chol/HDL Ratio: 2.1 ratio (ref 0.0–5.0)
Cholesterol, Total: 118 mg/dL (ref 100–199)
HDL: 57 mg/dL (ref >39)
LDL Chol Calc (NIH): 41 mg/dL (ref 0–99)
Triglycerides: 110 mg/dL (ref 0–149)
VLDL Cholesterol Cal: 20 mg/dL (ref 5–40)

## 2022-12-01 MED ORDER — TELMISARTAN 20 MG PO TABS: 20.0000 mg | ORAL_TABLET | Freq: Every day | ORAL | 0 refills | Status: DC

## 2022-12-01 NOTE — Telephone Encounter (Signed)
Stop Lisinopril. Start Telmisartan 20 mg once daily  BMET 2 weeks. Send BP readings in 1 week. Tereso Newcomer, PA-C    12/01/2022 1:36 PM

## 2022-12-02 DIAGNOSIS — G4733 Obstructive sleep apnea (adult) (pediatric): Secondary | ICD-10-CM | POA: Diagnosis not present

## 2022-12-04 ENCOUNTER — Other Ambulatory Visit: Payer: Medicare Other

## 2022-12-07 DIAGNOSIS — I951 Orthostatic hypotension: Secondary | ICD-10-CM | POA: Diagnosis not present

## 2022-12-07 DIAGNOSIS — Z86711 Personal history of pulmonary embolism: Secondary | ICD-10-CM | POA: Diagnosis not present

## 2022-12-07 DIAGNOSIS — Z8679 Personal history of other diseases of the circulatory system: Secondary | ICD-10-CM | POA: Diagnosis not present

## 2022-12-10 DIAGNOSIS — H4051X3 Glaucoma secondary to other eye disorders, right eye, severe stage: Secondary | ICD-10-CM | POA: Diagnosis not present

## 2022-12-11 DIAGNOSIS — I951 Orthostatic hypotension: Secondary | ICD-10-CM | POA: Diagnosis not present

## 2022-12-11 DIAGNOSIS — Z86711 Personal history of pulmonary embolism: Secondary | ICD-10-CM | POA: Diagnosis not present

## 2022-12-11 DIAGNOSIS — Z8679 Personal history of other diseases of the circulatory system: Secondary | ICD-10-CM | POA: Diagnosis not present

## 2022-12-14 DIAGNOSIS — I10 Essential (primary) hypertension: Secondary | ICD-10-CM

## 2022-12-15 MED ORDER — TELMISARTAN 40 MG PO TABS: 40.0000 mg | ORAL_TABLET | Freq: Every day | ORAL | 11 refills | Status: DC

## 2022-12-15 NOTE — Telephone Encounter (Signed)
Increase Telmisartan to 40 mg once daily BMET 2 weeks Rx sent and Lab ordered in Epic. Tereso Newcomer, PA-C    12/15/2022 4:56 PM

## 2022-12-16 ENCOUNTER — Other Ambulatory Visit
Admission: RE | Admit: 2022-12-16 | Discharge: 2022-12-16 | Disposition: A | Discharge status: Home / Self Care | Payer: Medicare Other | Attending: Physician Assistant | Admitting: Physician Assistant

## 2022-12-16 ENCOUNTER — Ambulatory Visit: Payer: Medicare Other

## 2022-12-16 DIAGNOSIS — Z86711 Personal history of pulmonary embolism: Secondary | ICD-10-CM | POA: Diagnosis not present

## 2022-12-16 DIAGNOSIS — I951 Orthostatic hypotension: Secondary | ICD-10-CM | POA: Diagnosis not present

## 2022-12-16 DIAGNOSIS — I10 Essential (primary) hypertension: Secondary | ICD-10-CM

## 2022-12-16 DIAGNOSIS — Z8679 Personal history of other diseases of the circulatory system: Secondary | ICD-10-CM | POA: Diagnosis not present

## 2022-12-16 LAB — BASIC METABOLIC PANEL
Anion gap: 10 (ref 5–15)
BUN: 25 mg/dL — ABNORMAL HIGH (ref 8–23)
CO2: 20 mmol/L — ABNORMAL LOW (ref 22–32)
Calcium: 9.2 mg/dL (ref 8.9–10.3)
Chloride: 102 mmol/L (ref 98–111)
Creatinine, Ser: 1.44 mg/dL — ABNORMAL HIGH (ref 0.61–1.24)
GFR, Estimated: 49 mL/min — ABNORMAL LOW (ref >60)
Glucose, Bld: 105 mg/dL — ABNORMAL HIGH (ref 70–99)
Potassium: 4.2 mmol/L (ref 3.5–5.1)
Sodium: 132 mmol/L — ABNORMAL LOW (ref 135–145)

## 2022-12-17 ENCOUNTER — Telehealth: Payer: Self-pay | Admitting: *Deleted

## 2022-12-17 DIAGNOSIS — I10 Essential (primary) hypertension: Secondary | ICD-10-CM

## 2022-12-17 MED ORDER — AMLODIPINE BESYLATE 5 MG PO TABS: 5.0000 mg | ORAL_TABLET | Freq: Every day | ORAL | 3 refills | Status: DC

## 2022-12-17 NOTE — Telephone Encounter (Signed)
-----   Message from Beatrice Lecher, [place] sent at 12/17/2022  8:29 AM EDT ----- See result note. Tereso Newcomer, PA-C    12/17/2022 8:29 AM

## 2022-12-18 DIAGNOSIS — Z8679 Personal history of other diseases of the circulatory system: Secondary | ICD-10-CM | POA: Diagnosis not present

## 2022-12-18 DIAGNOSIS — I951 Orthostatic hypotension: Secondary | ICD-10-CM | POA: Diagnosis not present

## 2022-12-18 DIAGNOSIS — Z86711 Personal history of pulmonary embolism: Secondary | ICD-10-CM | POA: Diagnosis not present

## 2022-12-23 ENCOUNTER — Encounter (INDEPENDENT_AMBULATORY_CARE_PROVIDER_SITE_OTHER): Payer: Self-pay

## 2022-12-23 ENCOUNTER — Encounter: Payer: Self-pay | Admitting: Family Medicine

## 2022-12-23 DIAGNOSIS — Z961 Presence of intraocular lens: Secondary | ICD-10-CM | POA: Diagnosis not present

## 2022-12-23 DIAGNOSIS — H34811 Central retinal vein occlusion, right eye, with macular edema: Secondary | ICD-10-CM | POA: Diagnosis not present

## 2022-12-23 DIAGNOSIS — H4051X3 Glaucoma secondary to other eye disorders, right eye, severe stage: Secondary | ICD-10-CM | POA: Diagnosis not present

## 2022-12-24 ENCOUNTER — Ambulatory Visit: Payer: Medicare Other | Attending: Physician Assistant

## 2022-12-24 ENCOUNTER — Other Ambulatory Visit
Admission: RE | Admit: 2022-12-24 | Discharge: 2022-12-24 | Disposition: A | Discharge status: Home / Self Care | Payer: Medicare Other | Attending: Physician Assistant | Admitting: Physician Assistant

## 2022-12-24 ENCOUNTER — Other Ambulatory Visit: Payer: Self-pay

## 2022-12-24 DIAGNOSIS — I951 Orthostatic hypotension: Secondary | ICD-10-CM | POA: Diagnosis not present

## 2022-12-24 DIAGNOSIS — Z8679 Personal history of other diseases of the circulatory system: Secondary | ICD-10-CM | POA: Diagnosis not present

## 2022-12-24 DIAGNOSIS — Z86711 Personal history of pulmonary embolism: Secondary | ICD-10-CM | POA: Diagnosis not present

## 2022-12-24 DIAGNOSIS — Z79899 Other long term (current) drug therapy: Secondary | ICD-10-CM | POA: Insufficient documentation

## 2022-12-24 LAB — BASIC METABOLIC PANEL
Anion gap: 9 (ref 5–15)
BUN: 17 mg/dL (ref 8–23)
CO2: 22 mmol/L (ref 22–32)
Calcium: 9 mg/dL (ref 8.9–10.3)
Chloride: 99 mmol/L (ref 98–111)
Creatinine, Ser: 0.99 mg/dL (ref 0.61–1.24)
GFR, Estimated: >60 mL/min (ref >60)
Glucose, Bld: 101 mg/dL — ABNORMAL HIGH (ref 70–99)
Potassium: 4.6 mmol/L (ref 3.5–5.1)
Sodium: 130 mmol/L — ABNORMAL LOW (ref 135–145)

## 2022-12-24 MED ORDER — APIXABAN 5 MG PO TABS: 5.0000 mg | ORAL_TABLET | Freq: Two times a day (BID) | ORAL | 0 refills | Status: DC

## 2022-12-24 NOTE — Addendum Note (Signed)
Addended by: Jodean Lima H on: 12/24/2022 11:17 AM   Modules accepted: Orders

## 2022-12-25 DIAGNOSIS — Z961 Presence of intraocular lens: Secondary | ICD-10-CM | POA: Diagnosis not present

## 2022-12-28 DIAGNOSIS — I951 Orthostatic hypotension: Secondary | ICD-10-CM | POA: Diagnosis not present

## 2022-12-28 DIAGNOSIS — Z86711 Personal history of pulmonary embolism: Secondary | ICD-10-CM | POA: Diagnosis not present

## 2022-12-28 DIAGNOSIS — Z8679 Personal history of other diseases of the circulatory system: Secondary | ICD-10-CM | POA: Diagnosis not present

## 2022-12-29 ENCOUNTER — Ambulatory Visit (INDEPENDENT_AMBULATORY_CARE_PROVIDER_SITE_OTHER): Payer: Medicare Other

## 2022-12-29 DIAGNOSIS — I10 Essential (primary) hypertension: Secondary | ICD-10-CM

## 2022-12-30 ENCOUNTER — Ambulatory Visit: Payer: Medicare Other

## 2022-12-30 MED ORDER — AMLODIPINE BESYLATE 10 MG PO TABS: 10.0000 mg | ORAL_TABLET | Freq: Every day | ORAL | 3 refills | Status: DC

## 2022-12-31 ENCOUNTER — Encounter: Payer: Self-pay | Admitting: Physician Assistant

## 2022-12-31 DIAGNOSIS — I951 Orthostatic hypotension: Secondary | ICD-10-CM | POA: Diagnosis not present

## 2022-12-31 DIAGNOSIS — Z8679 Personal history of other diseases of the circulatory system: Secondary | ICD-10-CM | POA: Diagnosis not present

## 2022-12-31 DIAGNOSIS — Z86711 Personal history of pulmonary embolism: Secondary | ICD-10-CM | POA: Diagnosis not present

## 2023-01-02 DIAGNOSIS — G4733 Obstructive sleep apnea (adult) (pediatric): Secondary | ICD-10-CM | POA: Diagnosis not present

## 2023-01-04 DIAGNOSIS — I951 Orthostatic hypotension: Secondary | ICD-10-CM | POA: Diagnosis not present

## 2023-01-04 DIAGNOSIS — Z8679 Personal history of other diseases of the circulatory system: Secondary | ICD-10-CM | POA: Diagnosis not present

## 2023-01-04 DIAGNOSIS — Z86711 Personal history of pulmonary embolism: Secondary | ICD-10-CM | POA: Diagnosis not present

## 2023-01-04 NOTE — Progress Notes (Unsigned)
Cardiology Office Note  Date:  01/05/2023   ID:  Brian [name], DOB [birth year], MRN [id number]  PCP:  Erasmo Downer, MD   Chief Complaint  Patient presents with   6-9 month follow up     "Doing well." Medications reviewed by the patient verbally.     HPI:  Brian [name] is a 82 y.o. 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 Age 89 quit early 2024 left subclavian stenosis, Ejection fraction 45 to 50% November 2023 4 mm cavernous right ICA aneurysm.  Who presents to establish care in the [facility] office of his labile blood pressure, coronary disease, PAD  Prior records reviewed  Hospitalized in February 2024 with sepsis following ERCP and PE,  issues with low BP  lisinopril was held.  started on Diamox in March for vision loss by his optometrist and had low BP readings  Diamox was then stopped 3/27. Pt sent in elevated BP readings at the end of March up to 170s/100s. His lisinopril was increased to 2.5mg  BID. His optometrist then resumed Diamox 10/20/22 so his lisinopril was decreased back to once daily dosing. Diamox then stopped 4/10 after eye surgery. Lisinopril increased to 2.5mg  BID on 4/17 due to elevated BP. Labile blood pressure   quit smoking in February when he was hospitalized. Symptoms of dizziness that is unrelated to his BP, saw neurology  no clear cause identified.  Has not had any falls.  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 No plans to resume Diamox. 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 His daughter is an Charity fundraiser at [employer]. Last low BP reading was a week ago - 92/58. High on Saturday was 168/97.  BP meds reviewed In the a.m. takes metoprolol In the pm takes amlodipine 5 to 10 milligram about one week ago,  Blood pressure was higher in Am  AM pressure 120 to  150s /90s  Swelling right foot right ankle dating back several years, minimal on the left  EKG personally reviewed by myself on todays visit Nsr rate 63 bpm , LAD  PMH:   has a past medical history of AAA (abdominal aortic aneurysm) (HCC), Adenomatous polyp, Anxiety, Carpal tunnel syndrome on left, Cervical spine disease, Colitis, Coronary artery disease, Deviated septum, Hyperlipidemia, Hypertension, Ischemic cardiomyopathy (05/19/2022), Low back pain, Myocardial infarction (HCC) (1998), Personal history of pulmonary embolism, Pulmonary embolism (HCC) (08/26/2022), Sleep apnea, and Wears dentures.  PSH:    Past Surgical History:  Procedure Laterality Date   APPENDECTOMY     AQUEOUS SHUNT Right 10/26/2022   Procedure: AHMED TUBE SHUNT WITH TUTOPLAST RIGHT;  Surgeon: Nevada Crane, MD;  Location: [facility];  Service: Ophthalmology;  Laterality: Right;   bunions     CATARACT EXTRACTION     COLONOSCOPY WITH PROPOFOL N/A 02/19/2015   Procedure: COLONOSCOPY WITH PROPOFOL;  Surgeon: Midge Minium, MD;  Location: [facility];  Service: Endoscopy;  Laterality: N/A;   COLONOSCOPY WITH PROPOFOL N/A 06/05/2019   Procedure: COLONOSCOPY WITH BIOPSY;  Surgeon: Midge Minium, MD;  Location: [facility];  Service: Endoscopy;  Laterality: N/A;  sleep apnea   ERCP N/A 08/25/2022   Procedure: ENDOSCOPIC RETROGRADE CHOLANGIOPANCREATOGRAPHY (ERCP);  Surgeon: Midge Minium, MD;  Location: [facility];  Service: Endoscopy;  Laterality: N/A;   HEMORRHOID  BANDING     HERNIA REPAIR     x3   OLECRANON BURSECTOMY Right    POLYPECTOMY N/A 06/05/2019   Procedure: POLYPECTOMY;  Surgeon: Midge Minium, MD;  Location: [facility];  Service: Endoscopy;  Laterality: N/A;    Current Outpatient Medications  Medication Sig Dispense Refill   amLODipine (NORVASC) 10 MG tablet Take 1 tablet (10 mg total) by mouth daily. 90 tablet 3   apixaban (ELIQUIS) 5 MG TABS tablet Take 1 tablet (5 mg  total) by mouth 2 (two) times daily. 180 tablet 0   aspirin 81 MG tablet Take 81 mg by mouth at bedtime.     atorvastatin (LIPITOR) 40 MG tablet Take 1 tablet (40 mg total) by mouth at bedtime. Take 40 mg by mouth at bedtime. 90 tablet 1   Cholecalciferol (VITAMIN D3) 50 MCG (2000 UT) CAPS Take by mouth daily.     Coenzyme Q10 (COQ10) 100 MG CAPS Take 1 capsule by mouth daily.       meclizine (ANTIVERT) 25 MG tablet Take 1 tablet (25 mg total) by mouth 2 (two) times daily as needed for dizziness. 30 tablet 0   metoprolol succinate (TOPROL-XL) 25 MG 24 hr tablet TAKE 1 TABLET(25 MG) BY MOUTH DAILY 30 tablet 11   Multiple Vitamin (MULTIVITAMIN PO) Take 1 tablet by mouth daily.     nitroGLYCERIN (NITROSTAT) 0.4 MG SL tablet Place 0.4 mg under the tongue every 5 (five) minutes as needed for chest pain.     Oxycodone HCl 10 MG TABS Take 1-2 tablets (10-20 mg total) by mouth every 6 (six) hours as needed (moderate to severe pain). 120 tablet 0   prednisoLONE acetate (PRED FORTE) 1 % ophthalmic suspension Place 1 drop into the right eye 4 (four) times daily.     saccharomyces boulardii (FLORASTOR) 250 MG capsule Take 250 mg by mouth 2 (two) times daily.     No current facility-administered medications for this visit.   Facility-Administered Medications Ordered in Other Visits  Medication Dose Route Frequency Provider Last Rate Last Admin   albuterol (PROVENTIL) (2.5 MG/3ML) 0.083% nebulizer solution 2.5 mg  2.5 mg Nebulization Once Stoneking, Hal, MD        Allergies:   Patient has no known allergies.   Social History:  The patient  reports that he quit smoking about 4 months ago. His smoking use included cigarettes. He has a 30.00 pack-year smoking history. He has never used smokeless tobacco. He reports that he does not currently use alcohol. He reports that he does not use drugs.   Family History:   family history includes Alzheimer's disease in his maternal grandmother; Colon cancer in his  maternal grandfather; Coronary artery disease in his brother; Heart failure in his mother; Hypertension in his father; Kidney disease in his father.    Review of Systems: Review of Systems  Constitutional: Negative.   HENT: Negative.    Respiratory: Negative.    Cardiovascular: Negative.   Gastrointestinal: Negative.   Musculoskeletal: Negative.   Neurological: Negative.   Psychiatric/Behavioral: Negative.    All other systems reviewed and are negative.    PHYSICAL EXAM: VS:  BP 120/80 (BP Location: Left Arm, Patient Position: Sitting, Cuff Size: Normal)   Pulse 63   Ht 5\' 9"  (1.753 m)   Wt 163 lb 2 oz (74 kg)   SpO2 97%   BMI 24.09 kg/m  , BMI Body mass index is 24.09 kg/m. GEN: Well nourished, well developed, in no acute  distress HEENT: normal Neck: no JVD, carotid bruits, or masses Cardiac: RRR; no murmurs, rubs, or gallops, trace pitting edema right foot right ankle Respiratory:  clear to auscultation bilaterally, normal work of breathing GI: soft, nontender, nondistended, + BS MS: no deformity or atrophy Skin: warm and dry, no rash Neuro:  Strength and sensation are intact Psych: euthymic mood, full affect   Recent Labs: 08/26/2022: Magnesium 1.9 09/09/2022: ALT 21; Hemoglobin 13.4; Platelets 446; TSH 2.36 12/24/2022: BUN 17; Creatinine, Ser 0.99; Potassium 4.6; Sodium 130    Lipid Panel Lab Results  Component Value Date   CHOL 118 11/30/2022   HDL 57 11/30/2022   LDLCALC 41 11/30/2022   TRIG 110 11/30/2022      Wt Readings from Last 3 Encounters:  01/05/23 163 lb 2 oz (74 kg)  11/30/22 164 lb (74.4 kg)  11/17/22 161 lb 3.2 oz (73.1 kg)     ASSESSMENT AND PLAN:  Problem List Items Addressed This Visit       Cardiology Problems   Ascending aortic aneurysm (HCC)   Coronary artery disease involving native coronary artery of native heart with angina pectoris (HCC) - Primary   Relevant Orders   EKG 12-Lead   Stenosis of left subclavian artery (HCC)      Other   Sleep apnea   Other Visit Diagnoses     Essential hypertension       Relevant Orders   EKG 12-Lead   Chronic combined systolic and diastolic heart failure (HCC)       Relevant Orders   EKG 12-Lead   Hyperlipidemia LDL goal <70       Relevant Orders   EKG 12-Lead   Infrarenal abdominal aortic aneurysm (AAA) without rupture (HCC)       Relevant Orders   EKG 12-Lead   Tobacco abuse          Essential hypertension Taking metoprolol in the morning, amlodipine 10 at night Somewhat labile blood pressures in the morning, often will be elevated, other times well-controlled Recommended he have prescription for hydralazine 25 mg to take as needed for systolic pressure over 140 which she could take in the morning if blood pressure is elevated Potentially could move metoprolol to the evening with his amlodipine if blood pressure continues to run high in the morning  Coronary artery disease with stable angina Currently with no symptoms of angina. No further workup at this time. Continue current medication regimen.  Hyperlipidemia Cholesterol is at goal on the current lipid regimen. No changes to the medications were made.  PAD Nonobstructive carotid disease left subclavian stenosis, 4 mm cavernous right ICA aneurysm.    Total encounter time more than 40 minutes  Greater than 50% was spent in counseling and coordination of care with the patient   Signed, Dossie Arbour, M.D., Ph.D. [facility] [place], [place] [phone]

## 2023-01-05 ENCOUNTER — Encounter: Payer: Self-pay | Admitting: Cardiovascular Disease

## 2023-01-05 ENCOUNTER — Ambulatory Visit: Payer: Medicare Other | Attending: Internal Medicine | Admitting: Cardiovascular Disease

## 2023-01-05 VITALS — BP 120/80 | HR 63 | Ht 69.0 in | Wt 163.1 lb

## 2023-01-05 DIAGNOSIS — Z720 Tobacco use: Secondary | ICD-10-CM

## 2023-01-05 DIAGNOSIS — I771 Stricture of artery: Secondary | ICD-10-CM

## 2023-01-05 DIAGNOSIS — I7121 Aneurysm of the ascending aorta, without rupture: Secondary | ICD-10-CM | POA: Diagnosis not present

## 2023-01-05 DIAGNOSIS — E785 Hyperlipidemia, unspecified: Secondary | ICD-10-CM | POA: Diagnosis not present

## 2023-01-05 DIAGNOSIS — I7143 Infrarenal abdominal aortic aneurysm, without rupture: Secondary | ICD-10-CM

## 2023-01-05 DIAGNOSIS — I5042 Chronic combined systolic (congestive) and diastolic (congestive) heart failure: Secondary | ICD-10-CM

## 2023-01-05 DIAGNOSIS — G4730 Sleep apnea, unspecified: Secondary | ICD-10-CM | POA: Diagnosis not present

## 2023-01-05 DIAGNOSIS — I10 Essential (primary) hypertension: Secondary | ICD-10-CM

## 2023-01-05 DIAGNOSIS — I25119 Atherosclerotic heart disease of native coronary artery with unspecified angina pectoris: Secondary | ICD-10-CM | POA: Diagnosis not present

## 2023-01-05 MED ORDER — NITROGLYCERIN 0.4 MG SL SUBL: 0.4000 mg | SUBLINGUAL_TABLET | SUBLINGUAL | 3 refills | Status: DC | PRN

## 2023-01-05 MED ORDER — HYDRALAZINE HCL 25 MG PO TABS: 25.0000 mg | ORAL_TABLET | Freq: Three times a day (TID) | ORAL | 3 refills | Status: DC

## 2023-01-05 NOTE — Patient Instructions (Signed)
Medication Instructions:  Please take hydralazine 25 mg up to three times a day as needed for pressure >140  If you need a refill on your cardiac medications before your next appointment, please call your pharmacy.   Lab work: No new labs needed  Testing/Procedures: No new testing needed  Follow-Up: At [facility], you and your health needs are our priority.  As part of our continuing mission to provide you with exceptional heart care, we have created designated Provider Care Teams.  These Care Teams include your primary Cardiologist (physician) and Advanced Practice Providers (APPs -  Physician Assistants and Nurse Practitioners) who all work together to provide you with the care you need, when you need it.  You will need a follow up appointment in 6 months  Providers on your designated Care Team:   Nicolasa Ducking, NP Eula Listen, PA-C Cadence Fransico Michael, [place]  COVID-19 Vaccine Information can be found at: [email or web address] For questions related to vaccine distribution or appointments, please email [email or web address] or call [phone].

## 2023-01-08 DIAGNOSIS — I951 Orthostatic hypotension: Secondary | ICD-10-CM | POA: Diagnosis not present

## 2023-01-08 DIAGNOSIS — Z8679 Personal history of other diseases of the circulatory system: Secondary | ICD-10-CM | POA: Diagnosis not present

## 2023-01-08 DIAGNOSIS — Z86711 Personal history of pulmonary embolism: Secondary | ICD-10-CM | POA: Diagnosis not present

## 2023-01-09 ENCOUNTER — Other Ambulatory Visit: Payer: Self-pay | Admitting: Family Medicine

## 2023-01-11 ENCOUNTER — Ambulatory Visit: Payer: Medicare Other | Admitting: Cardiology

## 2023-01-11 ENCOUNTER — Ambulatory Visit: Payer: Medicare Other | Admitting: Internal Medicine

## 2023-01-11 DIAGNOSIS — Z8679 Personal history of other diseases of the circulatory system: Secondary | ICD-10-CM | POA: Diagnosis not present

## 2023-01-11 DIAGNOSIS — I951 Orthostatic hypotension: Secondary | ICD-10-CM | POA: Diagnosis not present

## 2023-01-11 DIAGNOSIS — Z86711 Personal history of pulmonary embolism: Secondary | ICD-10-CM | POA: Diagnosis not present

## 2023-01-12 MED ORDER — OXYCODONE HCL 10 MG PO TABS: 10.0000 mg | ORAL_TABLET | Freq: Four times a day (QID) | ORAL | 0 refills | Status: DC | PRN

## 2023-01-15 ENCOUNTER — Telehealth: Payer: Self-pay | Admitting: Internal Medicine

## 2023-01-15 ENCOUNTER — Telehealth: Payer: Self-pay | Admitting: Physician Assistant

## 2023-01-15 DIAGNOSIS — I493 Ventricular premature depolarization: Secondary | ICD-10-CM

## 2023-01-15 NOTE — Telephone Encounter (Signed)
STAT if HR is under 50 or over 120 (normal HR is 60-100 beats per minute)  What is your heart rate? In the 40's every since this morning At this time it is 77  Do you have a log of your heart rate readings (document readings)?   Do you have any other symptoms? Little lightheaded,  a little out of sort, breathing heavier than normal at times, but not at this time

## 2023-01-15 NOTE — Telephone Encounter (Signed)
Patient's call sent straight to triage. Patient is complaining about his HR dropping into the 40's. Patient's latest BP 111/66 and HR 42. Patient stated the lowest BP was  86/62  with a HR 50. Patient stated he feels a little lightheaded, a little out of sorts, and his breathing gets heavy at times. Patient takes amlodipine 10 mg at night and takes metoprolol 25 mg daily in the AM. Patient does not take his hydralazine any more. Encouraged patient to hold his amlodipine and metoprolol on his next dose. Patient is a new patient of Dr. Windell Hummingbird. Patient has seen Tereso Newcomer PA in the past. Will forward to provider for advisement.

## 2023-01-15 NOTE — Telephone Encounter (Signed)
Called HR is 48 and BP 100/60s  Feeling at baseline. Amlodipine recently started 10. Also takes metoprolol  No symptoms. Instructed to stop amlodipine and metoprolol for now. See if it helps improve HR. Then follow up with cardiologist + get EKG.   If it does not improve, or has any symptoms, instructed to come to the ED.

## 2023-01-18 ENCOUNTER — Encounter: Payer: Self-pay | Admitting: Emergency Medicine

## 2023-01-18 NOTE — Telephone Encounter (Signed)
Dr. Mariah Milling requesting a 2 week Zio monitor. Patient notified of Dr. Windell Hummingbird recommendations and given instructions on monitor. Patient verbalizes understanding.

## 2023-01-18 NOTE — Telephone Encounter (Signed)
Patient is requesting call back. He states that his wife has appt with Dr. Mariah Milling today and he would like to know if he can also talk to Dr. Mariah Milling at that time in regards to medication management and BP.  Patient's wife's appt is at 1:25 P.M.

## 2023-01-18 NOTE — Telephone Encounter (Signed)
Called and spoke with patient. Informed him of the following recommendations from Dr. Mariah Milling.  History of labile blood pressure  Stay on the amlodipine in the evening  It is possible low heart rates coming from ectopy, have seen PVCs on prior EKG  In the setting of extra beats, measurements are sometimes inaccurate  If heart rate remains low could hold the metoprolol in the morning  Thx  TG    The patient states that he Friday night he called the on call cardiologist and was told to hold his Metoprolol and Amiodarone while his heart rate was low. Patient states that he has held his Metoprolol since Saturday and Amlodipine since Friday. Patient states that Friday night when he laid down he felt short of breath. Patient states that the shortness of breath improved Saturday morning.   Patient's recent vitals Sat 144/8 hr 63 120/87 hr 65  Sun  136/88 hr 65 103/73  97/74 hr 87  Mon 144/89 hr 73

## 2023-01-19 DIAGNOSIS — Z86711 Personal history of pulmonary embolism: Secondary | ICD-10-CM | POA: Diagnosis not present

## 2023-01-19 DIAGNOSIS — I951 Orthostatic hypotension: Secondary | ICD-10-CM | POA: Diagnosis not present

## 2023-01-19 DIAGNOSIS — Z8679 Personal history of other diseases of the circulatory system: Secondary | ICD-10-CM | POA: Diagnosis not present

## 2023-01-22 DIAGNOSIS — I951 Orthostatic hypotension: Secondary | ICD-10-CM | POA: Diagnosis not present

## 2023-01-22 DIAGNOSIS — Z86711 Personal history of pulmonary embolism: Secondary | ICD-10-CM | POA: Diagnosis not present

## 2023-01-22 DIAGNOSIS — Z8679 Personal history of other diseases of the circulatory system: Secondary | ICD-10-CM | POA: Diagnosis not present

## 2023-01-25 DIAGNOSIS — Z86711 Personal history of pulmonary embolism: Secondary | ICD-10-CM | POA: Diagnosis not present

## 2023-01-25 DIAGNOSIS — I951 Orthostatic hypotension: Secondary | ICD-10-CM | POA: Diagnosis not present

## 2023-01-25 DIAGNOSIS — Z8679 Personal history of other diseases of the circulatory system: Secondary | ICD-10-CM | POA: Diagnosis not present

## 2023-01-29 DIAGNOSIS — I951 Orthostatic hypotension: Secondary | ICD-10-CM | POA: Diagnosis not present

## 2023-01-29 DIAGNOSIS — Z8679 Personal history of other diseases of the circulatory system: Secondary | ICD-10-CM | POA: Diagnosis not present

## 2023-01-29 DIAGNOSIS — Z86711 Personal history of pulmonary embolism: Secondary | ICD-10-CM | POA: Diagnosis not present

## 2023-01-31 ENCOUNTER — Encounter: Payer: Self-pay | Admitting: Cardiovascular Disease

## 2023-02-01 ENCOUNTER — Ambulatory Visit: Payer: Medicare Other | Attending: Cardiovascular Disease

## 2023-02-01 ENCOUNTER — Other Ambulatory Visit: Payer: Self-pay | Admitting: *Deleted

## 2023-02-01 ENCOUNTER — Ambulatory Visit (INDEPENDENT_AMBULATORY_CARE_PROVIDER_SITE_OTHER): Payer: Medicare Other | Admitting: Family Medicine

## 2023-02-01 VITALS — BP 112/76 | HR 63 | Temp 98.3°F | Resp 13 | Ht 69.0 in | Wt 163.7 lb

## 2023-02-01 DIAGNOSIS — R600 Localized edema: Secondary | ICD-10-CM

## 2023-02-01 DIAGNOSIS — Z125 Encounter for screening for malignant neoplasm of prostate: Secondary | ICD-10-CM | POA: Diagnosis not present

## 2023-02-01 DIAGNOSIS — I493 Ventricular premature depolarization: Secondary | ICD-10-CM

## 2023-02-01 DIAGNOSIS — R232 Flushing: Secondary | ICD-10-CM

## 2023-02-01 DIAGNOSIS — G4733 Obstructive sleep apnea (adult) (pediatric): Secondary | ICD-10-CM | POA: Diagnosis not present

## 2023-02-01 DIAGNOSIS — R61 Generalized hyperhidrosis: Secondary | ICD-10-CM | POA: Insufficient documentation

## 2023-02-01 NOTE — Assessment & Plan Note (Signed)
Along with the hot flashes, he has been having night sweats. Does not have any recent travels or reasons to be exposed to TB, so unlikely. Will assess for other causes including malignancy. HbgA1c  CMP  TSH  PSA total  CBC w/ diff

## 2023-02-01 NOTE — Progress Notes (Signed)
Established Patient Office Visit  Subjective   Patient ID: Brian [name], male    DOB: Nov 22, 1940  Age: 82 y.o. MRN: [id number]  Chief Complaint  Patient presents with   Hot Flashes    States has been feeling hot lately, will start to sweat a lot and will last about 15 or 20 minutes.   Foot Swelling    Patient states that right foot swelling sometimes does cause pain.    Patient is here today w/ concerns regarding unilateral leg swelling (R) that has been happening for years and hot flash episodes that have been happening for at least 6 months. In the past couple of months the swelling of his R leg has become more severe, more frequent, and has become painful. The pain is present with exertion and with rest. He has been using compression stockings and is unsure if these are helping or not. He was recently started on amlodipine.  The hot flash episodes are variable in frequency but have been happening consistently for at least 6 months. They have become more severe and are now occurring more frequently. These episodes happen at rest, at night, and have no identifiable causes. He says he is dripping sweat by the end of them and the only thing that helps is cool air. He has not been taking his hydralazine.   Review of Systems  Constitutional:  Positive for diaphoresis.  Cardiovascular:  Positive for leg swelling. Negative for claudication.  Skin:  Negative for itching and rash.  Neurological:  Negative for tingling and sensory change.      Objective:     There were no vitals taken for this visit. BP Readings from Last 3 Encounters:  02/01/23 112/76  01/05/23 120/80  11/30/22 113/72      Physical Exam Constitutional:      Appearance: Normal appearance.  Musculoskeletal:     Right lower leg: No tenderness.     Right ankle: Swelling present.     Left ankle: Normal.     Right foot: Normal range of motion and normal capillary refill. Swelling (+3 pitting edema of R dorsal  foot) present. No tenderness. Normal pulse.     Left foot: Normal.  Skin:    General: Skin is warm and dry.     Findings: No erythema or rash.  Neurological:     General: No focal deficit present.     Mental Status: He is alert and oriented to person, place, and time.    No results found for any visits on 02/01/23.  Last CBC Lab Results  Component Value Date   WBC 8.7 09/09/2022   HGB 13.4 (A) 09/09/2022   HCT 40 (A) 09/09/2022   MCV 86.7 08/29/2022   MCH 29.8 08/29/2022   RDW 12.8 08/29/2022   PLT 446 (A) 09/09/2022   Last metabolic panel Lab Results  Component Value Date   GLUCOSE 101 (H) 12/24/2022   NA 130 (L) 12/24/2022   K 4.6 12/24/2022   CL 99 12/24/2022   CO2 22 12/24/2022   BUN 17 12/24/2022   CREATININE 0.99 12/24/2022   GFRNONAA >60 12/24/2022   CALCIUM 9.0 12/24/2022   PROT 6.1 (L) 08/29/2022   ALBUMIN 4.0 09/09/2022   LABGLOB 3.1 01/01/2021   AGRATIO 1.4 01/01/2021   BILITOT 0.9 08/29/2022   ALKPHOS 117 09/09/2022   AST 22 09/09/2022   ALT 21 09/09/2022   ANIONGAP 9 12/24/2022   Last thyroid functions Lab Results  Component Value Date  TSH 2.36 09/09/2022    The ASCVD Risk score (Arnett DK, et al., 2019) failed to calculate for the following reasons:   The 2019 ASCVD risk score is only valid for ages 42 to 12   The patient has a prior MI or stroke diagnosis    Assessment & Plan:   Problem List Items Addressed This Visit       Cardiovascular and Mediastinum   Hot flashes    Chronic and worsening. Etiology is unclear. Likely not related to the thyroid as last TSH was normal. Is not taking any medications that could be contributing. Will look for causes involving kidneys, liver, and thyroid. Will check PSA for evaluation for prostate cancer. HbgA1c  CMP TSH  PSA total  CBC w/ differential      Relevant Orders   Hemoglobin A1c   Comprehensive metabolic panel   TSH   PSA Total (Reflex To Free)   CBC w/Diff/Platelet     Other    Lower extremity edema - Primary    Chronic and worsening. Nadine Counts is concerned as his swelling is becoming painful and it had never been before. Etiology is likely multifactorial and involves venous insufficiency and previous injury. Will work to rule out issues involving the kidneys, liver, and thyroid. HbgA1c  CMP  TSH  CBC w/ diff      Relevant Orders   Hemoglobin A1c   Comprehensive metabolic panel   TSH   CBC w/Diff/Platelet   Night sweats    Along with the hot flashes, he has been having night sweats. Does not have any recent travels or reasons to be exposed to TB, so unlikely. Will assess for other causes including malignancy. HbgA1c  CMP  TSH  PSA total  CBC w/ diff      Relevant Orders   Hemoglobin A1c   Comprehensive metabolic panel   TSH   PSA Total (Reflex To Free)   CBC w/Diff/Platelet   Other Visit Diagnoses     Prostate cancer screening       Relevant Orders   PSA Total (Reflex To Free)       Return in about 4 months (around 06/04/2023) for as scheduled.    Rometta Emery, Medical Student   Patient seen along with MS[employer] student Jodi Marble. I personally evaluated this patient along with the student, and verified all aspects of the history, physical exam, and medical decision making as documented by the student. I agree with the student's documentation and have made all necessary edits.  Kenna Seward, Marzella Schlein, MD, MPH [facility]

## 2023-02-01 NOTE — Assessment & Plan Note (Signed)
Chronic and worsening. Brian [name] is concerned as his swelling is becoming painful and it had never been before. Etiology is likely multifactorial and involves venous insufficiency and previous injury. Will work to rule out issues involving the kidneys, liver, and thyroid. HbgA1c  CMP  TSH  CBC w/ diff

## 2023-02-01 NOTE — Assessment & Plan Note (Signed)
Chronic and worsening. Etiology is unclear. Likely not related to the thyroid as last TSH was normal. Is not taking any medications that could be contributing. Will look for causes involving kidneys, liver, and thyroid. Will check PSA for evaluation for prostate cancer. HbgA1c  CMP TSH  PSA total  CBC w/ differential

## 2023-02-01 NOTE — Addendum Note (Signed)
Addended by: Erasmo Downer on: 02/01/2023 04:50 PM   Modules accepted: Level of Service

## 2023-02-02 DIAGNOSIS — Z8679 Personal history of other diseases of the circulatory system: Secondary | ICD-10-CM | POA: Diagnosis not present

## 2023-02-02 DIAGNOSIS — Z86711 Personal history of pulmonary embolism: Secondary | ICD-10-CM | POA: Diagnosis not present

## 2023-02-02 DIAGNOSIS — I951 Orthostatic hypotension: Secondary | ICD-10-CM | POA: Diagnosis not present

## 2023-02-02 LAB — CBC WITH DIFFERENTIAL/PLATELET
Basophils Absolute: 0.1 10*3/uL (ref 0.0–0.2)
Basos: 1 %
EOS (ABSOLUTE): 0.3 10*3/uL (ref 0.0–0.4)
Eos: 3 %
Hematocrit: 43.5 % (ref 37.5–51.0)
Hemoglobin: 14.9 g/dL (ref 13.0–17.7)
Immature Grans (Abs): 0 10*3/uL (ref 0.0–0.1)
Immature Granulocytes: 0 %
Lymphocytes Absolute: 2 10*3/uL (ref 0.7–3.1)
Lymphs: 20 %
MCH: 30 pg (ref 26.6–33.0)
MCHC: 34.3 g/dL (ref 31.5–35.7)
MCV: 88 fL (ref 79–97)
Monocytes Absolute: 1.3 10*3/uL — ABNORMAL HIGH (ref 0.1–0.9)
Monocytes: 13 %
Neutrophils Absolute: 6.4 10*3/uL (ref 1.4–7.0)
Neutrophils: 63 %
Platelets: 253 10*3/uL (ref 150–450)
RBC: 4.96 x10E6/uL (ref 4.14–5.80)
RDW: 12.3 % (ref 11.6–15.4)
WBC: 10 10*3/uL (ref 3.4–10.8)

## 2023-02-02 LAB — COMPREHENSIVE METABOLIC PANEL
ALT: 16 IU/L (ref 0–44)
AST: 23 IU/L (ref 0–40)
Albumin: 4.3 g/dL (ref 3.7–4.7)
Alkaline Phosphatase: 96 IU/L (ref 44–121)
BUN/Creatinine Ratio: 14 (ref 10–24)
BUN: 14 mg/dL (ref 8–27)
Bilirubin Total: 0.7 mg/dL (ref 0.0–1.2)
CO2: 22 mmol/L (ref 20–29)
Calcium: 9.6 mg/dL (ref 8.6–10.2)
Chloride: 100 mmol/L (ref 96–106)
Creatinine, Ser: 1.03 mg/dL (ref 0.76–1.27)
Globulin, Total: 2.7 g/dL (ref 1.5–4.5)
Glucose: 75 mg/dL (ref 70–99)
Potassium: 4.9 mmol/L (ref 3.5–5.2)
Sodium: 135 mmol/L (ref 134–144)
Total Protein: 7 g/dL (ref 6.0–8.5)
eGFR: 73 mL/min/{1.73_m2} (ref >59)

## 2023-02-02 LAB — PSA TOTAL (REFLEX TO FREE): Prostate Specific Ag, Serum: 0.9 ng/mL (ref 0.0–4.0)

## 2023-02-02 LAB — HEMOGLOBIN A1C
Est. average glucose Bld gHb Est-mCnc: 137 mg/dL
Hgb A1c MFr Bld: 6.4 % — ABNORMAL HIGH (ref 4.8–5.6)

## 2023-02-02 LAB — TSH: TSH: 3.16 u[IU]/mL (ref 0.450–4.500)

## 2023-02-05 DIAGNOSIS — Z86711 Personal history of pulmonary embolism: Secondary | ICD-10-CM | POA: Diagnosis not present

## 2023-02-05 DIAGNOSIS — I951 Orthostatic hypotension: Secondary | ICD-10-CM | POA: Diagnosis not present

## 2023-02-05 DIAGNOSIS — Z8679 Personal history of other diseases of the circulatory system: Secondary | ICD-10-CM | POA: Diagnosis not present

## 2023-02-08 ENCOUNTER — Ambulatory Visit
Admission: RE | Admit: 2023-02-08 | Discharge: 2023-02-08 | Disposition: A | Discharge status: Home / Self Care | Payer: Medicare Other | Source: Ambulatory Visit | Attending: Neurosurgery | Admitting: Neurosurgery

## 2023-02-08 DIAGNOSIS — I671 Cerebral aneurysm, nonruptured: Secondary | ICD-10-CM | POA: Diagnosis not present

## 2023-02-09 DIAGNOSIS — I951 Orthostatic hypotension: Secondary | ICD-10-CM | POA: Diagnosis not present

## 2023-02-09 DIAGNOSIS — Z8679 Personal history of other diseases of the circulatory system: Secondary | ICD-10-CM | POA: Diagnosis not present

## 2023-02-09 DIAGNOSIS — I493 Ventricular premature depolarization: Secondary | ICD-10-CM

## 2023-02-09 DIAGNOSIS — Z86711 Personal history of pulmonary embolism: Secondary | ICD-10-CM | POA: Diagnosis not present

## 2023-02-11 ENCOUNTER — Ambulatory Visit (INDEPENDENT_AMBULATORY_CARE_PROVIDER_SITE_OTHER): Payer: Medicare Other | Admitting: Family Medicine

## 2023-02-11 ENCOUNTER — Ambulatory Visit
Admission: RE | Admit: 2023-02-11 | Discharge: 2023-02-11 | Disposition: A | Discharge status: Home / Self Care | Payer: Medicare Other | Source: Ambulatory Visit | Attending: Family Medicine | Admitting: Family Medicine

## 2023-02-11 ENCOUNTER — Encounter: Payer: Self-pay | Admitting: Family Medicine

## 2023-02-11 ENCOUNTER — Ambulatory Visit
Admission: RE | Admit: 2023-02-11 | Discharge: 2023-02-11 | Disposition: A | Discharge status: Home / Self Care | Payer: Medicare Other | Attending: Family Medicine | Admitting: Family Medicine

## 2023-02-11 VITALS — BP 121/83 | HR 66 | Temp 98.5°F | Wt 162.1 lb

## 2023-02-11 DIAGNOSIS — M79671 Pain in right foot: Secondary | ICD-10-CM

## 2023-02-11 DIAGNOSIS — L609 Nail disorder, unspecified: Secondary | ICD-10-CM | POA: Diagnosis not present

## 2023-02-11 DIAGNOSIS — R7303 Prediabetes: Secondary | ICD-10-CM | POA: Insufficient documentation

## 2023-02-11 DIAGNOSIS — M79674 Pain in right toe(s): Secondary | ICD-10-CM | POA: Diagnosis not present

## 2023-02-11 DIAGNOSIS — M7989 Other specified soft tissue disorders: Secondary | ICD-10-CM | POA: Diagnosis not present

## 2023-02-11 MED ORDER — METFORMIN HCL 500 MG PO TABS: 500.0000 mg | ORAL_TABLET | Freq: Two times a day (BID) | ORAL | 3 refills | Status: DC

## 2023-02-11 NOTE — Assessment & Plan Note (Signed)
A1c of 6.4, a new diagnosis for the patient. Discussed the meaning of A1c and its role in monitoring blood sugar levels. -Initiate trial of metformin 500mg  BID - discussed low carb diet and lifestyle changes - recheck A1c at next visit

## 2023-02-11 NOTE — Patient Instructions (Signed)
  Diet Recommendations for Diabetes   Starchy (carb) foods include: Bread, rice, pasta, potatoes, corn, crackers, bagels, muffins, all baked goods.  (Fruits, milk, and yogurt also have carbohydrate, but most of these foods will not spike your blood sugar as the starchy foods will.)  A few fruits do cause high blood sugars; use small portions of bananas (limit to 1/2 at a time), grapes, watermelon, and most tropical fruits.    Protein foods include: Meat, fish, poultry, eggs, dairy foods, and beans such as pinto and kidney beans (beans also provide carbohydrate).   1. Limit starchy foods to TWO per meal and ONE per snack. ONE portion of a starchy  food is equal to the following:   - ONE slice of bread (or its equivalent, such as half of a hamburger bun).   - 1/2 cup of a "scoopable" starchy food such as potatoes or rice.   - 15 grams of carbohydrate as shown on food label.  2. Include at every meal: a protein food, a carb food, and vegetables and/or fruit.   - Obtain twice as many veg's as protein or carbohydrate foods for both lunch and dinner.   - Fresh or frozen veg's are best.   - Try to keep frozen veg's on hand for a quick vegetable serving.        

## 2023-02-11 NOTE — Progress Notes (Signed)
Established Patient Office Visit  Subjective   Patient ID: Brian [name], male    DOB: December 29, 1940  Age: 82 y.o. MRN: [id number]  Chief Complaint  Patient presents with   Review labwork   Foot pain    Right foot pain, has been coming and going for a while started staying about 2 to 3 weeks ago.    HPI  Discussed the use of AI scribe software for clinical note transcription with the patient, who gave verbal consent to proceed.  History of Present Illness   The patient, an 82 year old with a history of vascular disease, coronary artery disease, ischemic bowel disease, and a history of PE, presents for follow-up of right foot pain and swelling and newly diagnosed prediabetes. The right foot swelling has been a chronic issue, but the patient reports new onset of pain. He also expresses concern about his toenails causing discomfort.  In addition, the patient was recently found to have an A1c of 6.4, placing him in the prediabetic range. This is a new diagnosis for the patient, and he is seeking further discussion and management options.         ROS per HPI    Objective:     BP 121/83   Pulse 66   Temp 98.5 F (36.9 C) (Oral)   Wt 162 lb 1.6 oz (73.5 kg)   SpO2 96%   BMI 23.94 kg/m    Physical Exam Constitutional:      General: He is not in acute distress.    Appearance: Normal appearance. He is not diaphoretic.  HENT:     Head: Normocephalic.  Eyes:     Conjunctiva/sclera: Conjunctivae normal.  Pulmonary:     Effort: Pulmonary effort is normal. No respiratory distress.  Musculoskeletal:     Comments: Collapsed arch of R foot, +swelling  Neurological:     Mental Status: He is alert and oriented to person, place, and time. Mental status is at baseline.      No results found for any visits on 02/11/23.    The ASCVD Risk score (Arnett DK, et al., 2019) failed to calculate for the following reasons:   The 2019 ASCVD risk score is only valid for ages 68 to 14    The patient has a prior MI or stroke diagnosis    Assessment & Plan:   Problem List Items Addressed This Visit       Other   Prediabetes - Primary    A1c of 6.4, a new diagnosis for the patient. Discussed the meaning of A1c and its role in monitoring blood sugar levels. -Initiate trial of metformin 500mg  BID - discussed low carb diet and lifestyle changes - recheck A1c at next visit      Other Visit Diagnoses     Right foot pain       Relevant Orders   DG Foot Complete Right   Ambulatory referral to Podiatry   Nail problem       Relevant Orders   Ambulatory referral to Podiatry          Right Foot Pain and Swelling: Ongoing issue with swelling, now with added pain. Patient reports discomfort in toe and nails. Seems to have some arch collapse -Order foot x-ray. -referral to podiatry  Vascular Disease: History of aortic calcifications, coronary artery disease, ischemic bowel disease, and pulmonary embolism. No new symptoms or changes reported. -Continue current management plan.        Return in about 4 months (  around 06/14/2023) for CPE, as scheduled.    Shirlee Latch, MD

## 2023-02-16 ENCOUNTER — Other Ambulatory Visit: Payer: Self-pay | Admitting: Family Medicine

## 2023-02-16 MED ORDER — OXYCODONE HCL 10 MG PO TABS: 10.0000 mg | ORAL_TABLET | Freq: Four times a day (QID) | ORAL | 0 refills | Status: DC | PRN

## 2023-02-18 ENCOUNTER — Encounter: Payer: Self-pay | Admitting: Family Medicine

## 2023-02-18 DIAGNOSIS — Z961 Presence of intraocular lens: Secondary | ICD-10-CM | POA: Diagnosis not present

## 2023-02-18 DIAGNOSIS — G4733 Obstructive sleep apnea (adult) (pediatric): Secondary | ICD-10-CM | POA: Diagnosis not present

## 2023-02-18 DIAGNOSIS — H34811 Central retinal vein occlusion, right eye, with macular edema: Secondary | ICD-10-CM | POA: Diagnosis not present

## 2023-02-18 DIAGNOSIS — H5010 Unspecified exotropia: Secondary | ICD-10-CM | POA: Diagnosis not present

## 2023-02-18 DIAGNOSIS — H4051X3 Glaucoma secondary to other eye disorders, right eye, severe stage: Secondary | ICD-10-CM | POA: Diagnosis not present

## 2023-02-18 DIAGNOSIS — H532 Diplopia: Secondary | ICD-10-CM | POA: Diagnosis not present

## 2023-02-18 DIAGNOSIS — G4730 Sleep apnea, unspecified: Secondary | ICD-10-CM | POA: Diagnosis not present

## 2023-02-22 DIAGNOSIS — Z86711 Personal history of pulmonary embolism: Secondary | ICD-10-CM | POA: Diagnosis not present

## 2023-02-22 DIAGNOSIS — Z8679 Personal history of other diseases of the circulatory system: Secondary | ICD-10-CM | POA: Diagnosis not present

## 2023-02-22 DIAGNOSIS — I951 Orthostatic hypotension: Secondary | ICD-10-CM | POA: Diagnosis not present

## 2023-03-01 ENCOUNTER — Encounter: Payer: Self-pay | Admitting: Neurosurgery

## 2023-03-01 DIAGNOSIS — I493 Ventricular premature depolarization: Secondary | ICD-10-CM | POA: Diagnosis not present

## 2023-03-01 DIAGNOSIS — Z8679 Personal history of other diseases of the circulatory system: Secondary | ICD-10-CM | POA: Diagnosis not present

## 2023-03-01 DIAGNOSIS — I951 Orthostatic hypotension: Secondary | ICD-10-CM | POA: Diagnosis not present

## 2023-03-01 DIAGNOSIS — Z86711 Personal history of pulmonary embolism: Secondary | ICD-10-CM | POA: Diagnosis not present

## 2023-03-04 DIAGNOSIS — G4733 Obstructive sleep apnea (adult) (pediatric): Secondary | ICD-10-CM | POA: Diagnosis not present

## 2023-03-04 DIAGNOSIS — I951 Orthostatic hypotension: Secondary | ICD-10-CM | POA: Diagnosis not present

## 2023-03-04 DIAGNOSIS — Z86711 Personal history of pulmonary embolism: Secondary | ICD-10-CM | POA: Diagnosis not present

## 2023-03-04 DIAGNOSIS — Z8679 Personal history of other diseases of the circulatory system: Secondary | ICD-10-CM | POA: Diagnosis not present

## 2023-03-08 DIAGNOSIS — I951 Orthostatic hypotension: Secondary | ICD-10-CM | POA: Diagnosis not present

## 2023-03-08 DIAGNOSIS — Z8679 Personal history of other diseases of the circulatory system: Secondary | ICD-10-CM | POA: Diagnosis not present

## 2023-03-08 DIAGNOSIS — Z86711 Personal history of pulmonary embolism: Secondary | ICD-10-CM | POA: Diagnosis not present

## 2023-03-11 DIAGNOSIS — I951 Orthostatic hypotension: Secondary | ICD-10-CM | POA: Diagnosis not present

## 2023-03-11 DIAGNOSIS — Z86711 Personal history of pulmonary embolism: Secondary | ICD-10-CM | POA: Diagnosis not present

## 2023-03-11 DIAGNOSIS — Z8679 Personal history of other diseases of the circulatory system: Secondary | ICD-10-CM | POA: Diagnosis not present

## 2023-03-14 ENCOUNTER — Other Ambulatory Visit: Payer: Self-pay | Admitting: Family Medicine

## 2023-03-15 MED ORDER — OXYCODONE HCL 10 MG PO TABS: 10.0000 mg | ORAL_TABLET | Freq: Four times a day (QID) | ORAL | 0 refills | Status: DC | PRN

## 2023-03-18 ENCOUNTER — Other Ambulatory Visit: Payer: Self-pay | Admitting: Family Medicine

## 2023-03-31 ENCOUNTER — Ambulatory Visit: Payer: Medicare Other | Admitting: Student in an Organized Health Care Education/Training Program

## 2023-04-13 ENCOUNTER — Ambulatory Visit: Payer: Medicare Other | Admitting: Gastroenterology

## 2023-04-13 ENCOUNTER — Other Ambulatory Visit: Payer: Self-pay | Admitting: Family Medicine

## 2023-04-13 ENCOUNTER — Telehealth: Payer: Self-pay

## 2023-04-13 DIAGNOSIS — K909 Intestinal malabsorption, unspecified: Secondary | ICD-10-CM

## 2023-04-13 NOTE — Telephone Encounter (Signed)
Lab ordered and pt will go to [facility] to get supplies for test

## 2023-04-13 NOTE — Addendum Note (Signed)
Addended by: Roena Malady on: 04/13/2023 12:31 PM   Modules accepted: Orders

## 2023-04-13 NOTE — Telephone Encounter (Signed)
Pt called to report that he has been having oily stools x 4 days, denies any other Sx at this time... Pt wants to know if he should be seen in the office to be evaluated... Please advise

## 2023-04-15 ENCOUNTER — Telehealth: Payer: Self-pay | Admitting: Family Medicine

## 2023-04-15 ENCOUNTER — Encounter: Payer: Self-pay | Admitting: Family Medicine

## 2023-04-15 DIAGNOSIS — G894 Chronic pain syndrome: Secondary | ICD-10-CM

## 2023-04-15 MED ORDER — OXYCODONE HCL 10 MG PO TABS: 10.0000 mg | ORAL_TABLET | Freq: Four times a day (QID) | ORAL | 0 refills | Status: DC | PRN

## 2023-04-15 MED ORDER — OXYCODONE HCL 10 MG PO TABS
10.0000 mg | ORAL_TABLET | Freq: Four times a day (QID) | ORAL | 0 refills | Status: DC | PRN
Start: 2023-04-15 — End: 2023-05-17

## 2023-04-15 NOTE — Telephone Encounter (Addendum)
Medication Refill - Medication: Oxycodone HCl 10 MG TABS   Has the patient contacted their pharmacy? Yes.   (Agent: If no, request that the patient contact the pharmacy for the refill. If patient does not wish to contact the pharmacy document the reason why and proceed with request.) (Agent: If yes, when and what did the pharmacy advise?)  Preferred Pharmacy (with phone number or street name):  [id number] - [place], [place] - [address] [place] [address] AT [address] [place] [address]   Has the patient been seen for an appointment in the last year OR does the patient have an upcoming appointment? Yes.    Pt states his pain med is due today and knows Dr B is out.  Can someone else do this for him today?OK to send mychart message to let him know this has been done.

## 2023-04-19 ENCOUNTER — Telehealth: Payer: Self-pay

## 2023-04-19 NOTE — Telephone Encounter (Signed)
Patient is calling about a test procedure that is not happening like he thought it would. He would not tell me what test he said you would know what I was talking about.

## 2023-04-20 ENCOUNTER — Ambulatory Visit: Payer: Medicare Other | Admitting: Student in an Organized Health Care Education/Training Program

## 2023-04-20 ENCOUNTER — Encounter: Payer: Self-pay | Admitting: Student in an Organized Health Care Education/Training Program

## 2023-04-20 VITALS — BP 122/68 | HR 60 | Temp 98.0°F | Ht 69.0 in | Wt 164.2 lb

## 2023-04-20 DIAGNOSIS — R0602 Shortness of breath: Secondary | ICD-10-CM

## 2023-04-20 DIAGNOSIS — Z23 Encounter for immunization: Secondary | ICD-10-CM

## 2023-04-20 NOTE — Progress Notes (Unsigned)
Synopsis: Referred in for shortness of breath by Brian Downer, MD  Assessment & Plan:   1. Shortness of breath  Presenting for the evaluation of shortness of breath that is mostly exertional. There's also associated cough that is only nocturnally and is not an issue for him during the day. Reviewed pulmonary function testing performed 03/19/2022 with effectively normal spirometry and lung volumes, and a depressed DLCO. I have also reviewed his chest imaging (CT) from February of 2024 that was notable for emphysema.  I suspect that the symptoms are less consistent with COPD (especially with normal spirometry) given lack of wheeze or signs of chronic bronchitis. His physical exam is notable for significant pitting edema in the bilateral lower extremities suggestive of heart failure. ECHO from November of 2023 showed HFmrEF with EF of 45% and some diastolic dysfunction. With these findings on his physical exam, I will repeat the echo to re-assess ejection fraction and attempt to estimate PASP.  Finally, should cardiac workup not be revealing, I would consider initiating inhalers, but doubt their effect is going to be noticeable. Patient has a history of glaucoma and LAMA therapy would be contraindicated.   - ECHOCARDIOGRAM COMPLETE; Future   Return in about 6 months (around 10/19/2023).  I spent 45 minutes caring for this patient today, including preparing to see the patient, obtaining a medical history , reviewing a separately obtained history, performing a medically appropriate examination and/or evaluation, counseling and educating the patient/family/caregiver, ordering medications, tests, or procedures, documenting clinical information in the electronic health record, and independently interpreting results (not separately reported/billed) and communicating results to the patient/family/caregiver  Brian Chute, MD [facility] 04/20/2023 4:13 PM    End of visit  medications:  No orders of the defined types were placed in this encounter.    Current Outpatient Medications:    amLODipine (NORVASC) 10 MG tablet, Take 1 tablet (10 mg total) by mouth daily., Disp: 90 tablet, Rfl: 3   aspirin 81 MG tablet, Take 81 mg by mouth at bedtime., Disp: , Rfl:    atorvastatin (LIPITOR) 40 MG tablet, Take 1 tablet (40 mg total) by mouth at bedtime. Take 40 mg by mouth at bedtime., Disp: 90 tablet, Rfl: 1   Cholecalciferol (VITAMIN D3) 50 MCG (2000 UT) CAPS, Take by mouth daily., Disp: , Rfl:    Coenzyme Q10 (COQ10) 100 MG CAPS, Take 1 capsule by mouth daily.  , Disp: , Rfl:    ELIQUIS 5 MG TABS tablet, TAKE 1 TABLET(5 MG) BY MOUTH TWICE DAILY, Disp: 180 tablet, Rfl: 0   meclizine (ANTIVERT) 25 MG tablet, Take 1 tablet (25 mg total) by mouth 2 (two) times daily as needed for dizziness., Disp: 30 tablet, Rfl: 0   Multiple Vitamin (MULTIVITAMIN PO), Take 1 tablet by mouth daily., Disp: , Rfl:    nitroGLYCERIN (NITROSTAT) 0.4 MG SL tablet, Place 1 tablet (0.4 mg total) under the tongue every 5 (five) minutes as needed for chest pain., Disp: 25 tablet, Rfl: 3   Oxycodone HCl 10 MG TABS, Take 1-2 tablets (10-20 mg total) by mouth every 6 (six) hours as needed (moderate to severe pain)., Disp: 120 tablet, Rfl: 0   saccharomyces boulardii (FLORASTOR) 250 MG capsule, Take 250 mg by mouth 2 (two) times daily., Disp: , Rfl:    hydrALAZINE (APRESOLINE) 25 MG tablet, Take 1 tablet (25 mg total) by mouth 3 (three) times daily. Take as needed for blood pressure greater then 140., Disp: 270 tablet, Rfl:  3   metFORMIN (GLUCOPHAGE) 500 MG tablet, Take 1 tablet (500 mg total) by mouth 2 (two) times daily with a meal. (Patient not taking: Reported on 04/20/2023), Disp: 60 tablet, Rfl: 3   metoprolol succinate (TOPROL-XL) 25 MG 24 hr tablet, TAKE 1 TABLET(25 MG) BY MOUTH DAILY (Patient not taking: Reported on 04/20/2023), Disp: 30 tablet, Rfl: 11 No current facility-administered medications  for this visit.  Facility-Administered Medications Ordered in Other Visits:    albuterol (PROVENTIL) (2.5 MG/3ML) 0.083% nebulizer solution 2.5 mg, 2.5 mg, Nebulization, Once, Brian Laughter, MD   Subjective:   PATIENT ID: Brian [name]: male DOB: [birth year], MRN: [id number]  Chief Complaint  Patient presents with   Follow-up    SOB with exertion and occ prod cough with clear sputum mainly at night.     HPI Patient is a pleasant 82 year old male presenting to clinic for the evaluation of shortness of breath.  Patient reports symptoms that have been ongoing for at least a year, mostly consisting of shortness of breath with exertion and rarely at rest.  He also reports an occasional cough that is productive of clear sputum.  The cough is mostly present at night.  There is no hemoptysis, no change in the color of sputum, no fevers, no chills, no chest pain, no chest tightness, no wheezing, no night sweats, no weight loss.  On further questioning, it appears that patient has lower extremity edema, more noticeable in the right leg.  There is chronic right foot swelling that seems to be related to history of ankle fracture many years ago.  This does improve when he raises his legs.  Otherwise, patient does not have any signs of systemic illness, no signs of infection, no wheeze, and no sign of recurrent bronchitis.  He has not had to use any inhalers nor any prednisone recently.  Patient's past medical history is notable for OSA for which she was initiated on CPAP.  He also has a history of CAD, hypertension, hyperlipidemia history of PE, and prediabetes.  He was last seen by Brian [name] in June 2024  Patient does not report any occupational exposures but does report a significant smoking history.  He smoked half a pack a day for around 60 years with between 30 and 40 pack years of smoking history.  Patient also has a history of glaucoma as well as a dislocated intraocular lens for which he  has been followed closely with ophthalmology.  This was also complicated by ocular ischemia resulting in some vision loss.  Ancillary information including prior medications, full medical/surgical/family/social histories, and PFTs (when available) are listed below and have been reviewed.   Review of Systems  Constitutional:  Negative for chills, fever and weight loss.  Respiratory:  Positive for cough and shortness of breath. Negative for hemoptysis, sputum production and wheezing.   Cardiovascular:  Positive for leg swelling. Negative for chest pain.     Objective:   Vitals:   04/20/23 1529  BP: 122/68  Pulse: 60  Temp: 98 F (36.7 C)  TempSrc: Oral  SpO2: 98%  Weight: 164 lb 3.2 oz (74.5 kg)  Height: 5\' 9"  (1.753 m)   98% on RA BMI Readings from Last 3 Encounters:  04/20/23 24.25 kg/m  02/11/23 23.94 kg/m  02/01/23 24.17 kg/m   Wt Readings from Last 3 Encounters:  04/20/23 164 lb 3.2 oz (74.5 kg)  02/11/23 162 lb 1.6 oz (73.5 kg)  02/01/23 163 lb 11.2 oz (74.3 kg)  Physical Exam Constitutional:      Appearance: Normal appearance. He is not ill-appearing.  Cardiovascular:     Rate and Rhythm: Normal rate and regular rhythm.     Pulses: Normal pulses.     Heart sounds: Normal heart sounds.  Pulmonary:     Effort: Pulmonary effort is normal.     Breath sounds: Normal breath sounds. No wheezing or rales.  Abdominal:     Palpations: Abdomen is soft.  Musculoskeletal:     Right lower leg: Edema present.     Left lower leg: Edema present.     Comments: R>L, bilateral lower extremity pitting edema  Neurological:     General: No focal deficit present.     Mental Status: He is alert and oriented to person, place, and time. Mental status is at baseline.       Ancillary Information    Past Medical History:  Diagnosis Date   AAA (abdominal aortic aneurysm) (HCC)    Adenomatous polyp    Anxiety    Carpal tunnel syndrome on left    Cervical spine disease     Colitis    Coronary artery disease    S/p anterior MI tx with PCI in 1998  //  Myoview 6/22: EF 45, ant and septal scar; no ischemia; intermediate risk // Echocardiogram 7/22: EF 45-50, ant-sept and apical HK, Gr 1 DD, normal RVSF, mild MR   Deviated septum    Hyperlipidemia    Hypertension    Renal Artery [place] 12/2022: no RAS bilaterally   Ischemic cardiomyopathy 05/19/2022   Echocardiogram 06/02/22: EF 45-50, global HK, Gr 1 DD, NL RVSF, NL PASP (RVSP 26.4), trivial MR, RAP 3   Low back pain    Myocardial infarction (HCC) 1998   Personal history of pulmonary embolism    Pulmonary embolism (HCC) 08/26/2022   Sleep apnea    CPAP   Wears dentures    full upper     Family History  Problem Relation Age of Onset   Heart failure Mother    Kidney disease Father    Hypertension Father    Coronary artery disease Brother    Alzheimer's disease Maternal Grandmother    Colon cancer Maternal Grandfather      Past Surgical History:  Procedure Laterality Date   APPENDECTOMY     AQUEOUS SHUNT Right 10/26/2022   Procedure: AHMED TUBE SHUNT WITH TUTOPLAST RIGHT;  Surgeon: Nevada Crane, MD;  Location: [facility];  Service: Ophthalmology;  Laterality: Right;   bunions     CATARACT EXTRACTION     COLONOSCOPY WITH PROPOFOL N/A 02/19/2015   Procedure: COLONOSCOPY WITH PROPOFOL;  Surgeon: Midge Minium, MD;  Location: [facility];  Service: Endoscopy;  Laterality: N/A;   COLONOSCOPY WITH PROPOFOL N/A 06/05/2019   Procedure: COLONOSCOPY WITH BIOPSY;  Surgeon: Midge Minium, MD;  Location: [facility];  Service: Endoscopy;  Laterality: N/A;  sleep apnea   ERCP N/A 08/25/2022   Procedure: ENDOSCOPIC RETROGRADE CHOLANGIOPANCREATOGRAPHY (ERCP);  Surgeon: Midge Minium, MD;  Location: [facility];  Service: Endoscopy;  Laterality: N/A;   HEMORRHOID BANDING     HERNIA REPAIR     x3   OLECRANON BURSECTOMY Right    POLYPECTOMY N/A 06/05/2019   Procedure: POLYPECTOMY;  Surgeon:  Midge Minium, MD;  Location: [facility];  Service: Endoscopy;  Laterality: N/A;    Social History   Socioeconomic History   Marital status: Married    Spouse name: [name]   Number of  children: 1   Years of education: Not on file   Highest education level: 12th grade  Occupational History   Occupation: retired    Comment: Electronics engineer  Tobacco Use   Smoking status: Former    Current packs/day: 0.00    Average packs/day: 0.5 packs/day for 60.0 years (30.0 ttl pk-yrs)    Types: Cigarettes    Start date: 08/26/1962    Quit date: 08/26/2022    Years since quitting: 0.6   Smokeless tobacco: Never   Tobacco comments:    1/2 3/4 ppd (since age 43)  Vaping Use   Vaping status: Never Used  Substance and Sexual Activity   Alcohol use: Not Currently    Comment: occasionally, "none   Drug use: No   Sexual activity: Yes    Partners: Female  Other Topics Concern   Not on file  Social History Narrative   Not on file   Social Determinants of Health   Financial Resource Strain: Low Risk  (11/30/2022)   Overall Financial Resource Strain (CARDIA)    Difficulty of Paying Living Expenses: Not hard at all  Food Insecurity: No Food Insecurity (11/30/2022)   Hunger Vital Sign    Worried About Running Out of Food in the Last Year: Never true    Ran Out of Food in the Last Year: Never true  Transportation Needs: No Transportation Needs (11/30/2022)   PRAPARE - Administrator, Civil Service (Medical): No    Lack of Transportation (Non-Medical): No  Physical Activity: Unknown (11/30/2022)   Exercise Vital Sign    Days of Exercise per Week: 0 days    Minutes of Exercise per Session: Not on file  Stress: No Stress Concern Present (11/30/2022)   [employer] of Occupational Health - Occupational Stress Questionnaire    Feeling of Stress : Only a little  Social Connections: Socially Integrated (11/30/2022)   Social Connection and Isolation Panel  [NHANES]    Frequency of Communication with Friends and Family: More than three times a week    Frequency of Social Gatherings with Friends and Family: Twice a week    Attends Religious Services: More than 4 times per year    Active Member of Clubs or Organizations: Yes    Attends Banker Meetings: Never    Marital Status: Married  Catering manager Violence: Not At Risk (08/27/2022)   Humiliation, Afraid, Rape, and Kick questionnaire    Fear of Current or Ex-Partner: No    Emotionally Abused: No    Physically Abused: No    Sexually Abused: No     No Known Allergies   CBC    Component Value Date/Time   WBC 10.0 02/01/2023 1520   WBC 6.7 08/29/2022 0333   RBC 4.96 02/01/2023 1520   RBC 4.49 09/09/2022 0000   HGB 14.9 02/01/2023 1520   HCT 43.5 02/01/2023 1520   PLT 253 02/01/2023 1520   MCV 88 02/01/2023 1520   MCV 91 02/26/2014 1727   MCH 30.0 02/01/2023 1520   MCH 29.8 08/29/2022 0333   MCHC 34.3 02/01/2023 1520   MCHC 34.4 08/29/2022 0333   RDW 12.3 02/01/2023 1520   RDW 13.6 02/26/2014 1727   LYMPHSABS 2.0 02/01/2023 1520   MONOABS 1.6 (H) 08/26/2022 1501   EOSABS 0.3 02/01/2023 1520   BASOSABS 0.1 02/01/2023 1520    Pulmonary Functions Testing Results:    Latest Ref Rng & Units 03/19/2022    3:14 PM  PFT  Results  FVC-Pre L 3.90   FVC-Predicted Pre % 97   Pre FEV1/FVC % % 73   FEV1-Pre L 2.83   FEV1-Predicted Pre % 99   DLCO uncorrected ml/min/mmHg 10.90   DLCO UNC% % 44   DLVA Predicted % 40   TLC L 6.93   TLC % Predicted % 98   RV % Predicted % 100     Outpatient Medications Prior to Visit  Medication Sig Dispense Refill   amLODipine (NORVASC) 10 MG tablet Take 1 tablet (10 mg total) by mouth daily. 90 tablet 3   aspirin 81 MG tablet Take 81 mg by mouth at bedtime.     atorvastatin (LIPITOR) 40 MG tablet Take 1 tablet (40 mg total) by mouth at bedtime. Take 40 mg by mouth at bedtime. 90 tablet 1   Cholecalciferol (VITAMIN D3) 50 MCG (2000  UT) CAPS Take by mouth daily.     Coenzyme Q10 (COQ10) 100 MG CAPS Take 1 capsule by mouth daily.       ELIQUIS 5 MG TABS tablet TAKE 1 TABLET(5 MG) BY MOUTH TWICE DAILY 180 tablet 0   meclizine (ANTIVERT) 25 MG tablet Take 1 tablet (25 mg total) by mouth 2 (two) times daily as needed for dizziness. 30 tablet 0   Multiple Vitamin (MULTIVITAMIN PO) Take 1 tablet by mouth daily.     nitroGLYCERIN (NITROSTAT) 0.4 MG SL tablet Place 1 tablet (0.4 mg total) under the tongue every 5 (five) minutes as needed for chest pain. 25 tablet 3   Oxycodone HCl 10 MG TABS Take 1-2 tablets (10-20 mg total) by mouth every 6 (six) hours as needed (moderate to severe pain). 120 tablet 0   saccharomyces boulardii (FLORASTOR) 250 MG capsule Take 250 mg by mouth 2 (two) times daily.     hydrALAZINE (APRESOLINE) 25 MG tablet Take 1 tablet (25 mg total) by mouth 3 (three) times daily. Take as needed for blood pressure greater then 140. 270 tablet 3   metFORMIN (GLUCOPHAGE) 500 MG tablet Take 1 tablet (500 mg total) by mouth 2 (two) times daily with a meal. (Patient not taking: Reported on 04/20/2023) 60 tablet 3   metoprolol succinate (TOPROL-XL) 25 MG 24 hr tablet TAKE 1 TABLET(25 MG) BY MOUTH DAILY (Patient not taking: Reported on 04/20/2023) 30 tablet 11   prednisoLONE acetate (PRED FORTE) 1 % ophthalmic suspension Place 1 drop into the right eye 4 (four) times daily. (Patient not taking: Reported on 04/20/2023)     Facility-Administered Medications Prior to Visit  Medication Dose Route Frequency Provider Last Rate Last Admin   albuterol (PROVENTIL) (2.5 MG/3ML) 0.083% nebulizer solution 2.5 mg  2.5 mg Nebulization Once Brian Laughter, MD

## 2023-04-21 NOTE — Telephone Encounter (Signed)
Left message on voicemail.

## 2023-04-22 ENCOUNTER — Other Ambulatory Visit: Payer: Self-pay

## 2023-04-22 MED ORDER — METRONIDAZOLE 500 MG PO TABS: 500.0000 mg | ORAL_TABLET | Freq: Two times a day (BID) | ORAL | 0 refills | Status: DC

## 2023-04-22 MED ORDER — CIPROFLOXACIN HCL 500 MG PO TABS: 500.0000 mg | ORAL_TABLET | Freq: Two times a day (BID) | ORAL | 0 refills | Status: DC

## 2023-04-22 NOTE — Telephone Encounter (Signed)
Pt called to request a refill on Ax to have on hold as he has taken some from the Rx from 06/2022 and would like to have a newer Rx on hand...  Per Dr Annabell Sabal note, he did provide this for pt and Rx sent through e-scribe

## 2023-04-22 NOTE — Telephone Encounter (Signed)
Pt explained he had some issues with getting the sample to [facility], their system was down and the sample was not frozen... New containers received, but pt is not currently having fatty stools at this time... Pt will hold off on providing sample at this time... In the meantime I will send the requisition form for pt to have and he will let me know if stools change and if he needs to provide the sample

## 2023-04-28 ENCOUNTER — Other Ambulatory Visit: Payer: Self-pay | Admitting: Family Medicine

## 2023-04-28 NOTE — Telephone Encounter (Signed)
Requested Prescriptions  Pending Prescriptions Disp Refills   atorvastatin (LIPITOR) 40 MG tablet [Pharmacy Med Name: Atorvastatin Calcium 40 MG Oral Tablet] 100 tablet 1    Sig: TAKE 1 TABLET BY MOUTH AT  BEDTIME     Cardiovascular:  Antilipid - Statins Failed - 04/28/2023  4:11 AM      Failed - Lipid Panel in normal range within the last 12 months    Cholesterol, Total  Date Value Ref Range Status  11/30/2022 118 100 - 199 mg/dL Final   LDL Chol Calc (NIH)  Date Value Ref Range Status  11/30/2022 41 0 - 99 mg/dL Final   HDL  Date Value Ref Range Status  11/30/2022 57 >39 mg/dL Final   Triglycerides  Date Value Ref Range Status  11/30/2022 110 0 - 149 mg/dL Final         Passed - Patient is not pregnant      Passed - Valid encounter within last 12 months    Recent Outpatient Visits           2 months ago Prediabetes   [facility] [place], Marzella Schlein, MD   2 months ago Lower extremity edema   [facility] [place], Marzella Schlein, MD   4 months ago Primary hypertension   [facility] [place], Marzella Schlein, MD   7 months ago Primary hypertension    [facility] [place], Marzella Schlein, MD       Future Appointments             In 1 month Bacigalupo, Marzella Schlein, MD [facility]   In 2 months Gollan, Tollie Pizza, MD [facility] [place]   In 5 months Deirdre Evener, MD [facility]

## 2023-05-04 DIAGNOSIS — K909 Intestinal malabsorption, unspecified: Secondary | ICD-10-CM | POA: Diagnosis not present

## 2023-05-06 DIAGNOSIS — H34811 Central retinal vein occlusion, right eye, with macular edema: Secondary | ICD-10-CM | POA: Diagnosis not present

## 2023-05-06 DIAGNOSIS — H4051X3 Glaucoma secondary to other eye disorders, right eye, severe stage: Secondary | ICD-10-CM | POA: Diagnosis not present

## 2023-05-06 DIAGNOSIS — H532 Diplopia: Secondary | ICD-10-CM | POA: Diagnosis not present

## 2023-05-06 DIAGNOSIS — Z961 Presence of intraocular lens: Secondary | ICD-10-CM | POA: Diagnosis not present

## 2023-05-11 LAB — FECAL FAT, QUALITATIVE
Fat Qual Neutral, Stl: NORMAL
Fat Qual Total, Stl: INCREASED

## 2023-05-12 DIAGNOSIS — H4051X3 Glaucoma secondary to other eye disorders, right eye, severe stage: Secondary | ICD-10-CM | POA: Diagnosis not present

## 2023-05-12 DIAGNOSIS — H34811 Central retinal vein occlusion, right eye, with macular edema: Secondary | ICD-10-CM | POA: Diagnosis not present

## 2023-05-14 ENCOUNTER — Ambulatory Visit
Admission: RE | Admit: 2023-05-14 | Discharge: 2023-05-14 | Disposition: A | Discharge status: Home / Self Care | Payer: Medicare Other | Source: Ambulatory Visit | Attending: Student in an Organized Health Care Education/Training Program | Admitting: Student in an Organized Health Care Education/Training Program

## 2023-05-14 DIAGNOSIS — R0602 Shortness of breath: Secondary | ICD-10-CM | POA: Insufficient documentation

## 2023-05-14 DIAGNOSIS — R0600 Dyspnea, unspecified: Secondary | ICD-10-CM | POA: Diagnosis not present

## 2023-05-14 LAB — ECHOCARDIOGRAM COMPLETE
AR max vel: 2.58 cm2
AV Area VTI: 2.32 cm2
AV Area mean vel: 2.33 cm2
AV Mean grad: 2 mm[Hg]
AV Peak grad: 3.5 mm[Hg]
Ao pk vel: 0.93 m/s
Area-P 1/2: 2.55 cm2
Calc EF: 44.3 %
S' Lateral: 3.5 cm
Single Plane A2C EF: 45.6 %
Single Plane A4C EF: 41.2 %

## 2023-05-15 ENCOUNTER — Encounter: Payer: Self-pay | Admitting: Family Medicine

## 2023-05-17 ENCOUNTER — Other Ambulatory Visit: Payer: Self-pay

## 2023-05-17 ENCOUNTER — Telehealth: Payer: Self-pay | Admitting: Family Medicine

## 2023-05-17 DIAGNOSIS — G894 Chronic pain syndrome: Secondary | ICD-10-CM

## 2023-05-17 MED ORDER — OXYCODONE HCL 10 MG PO TABS: 10.0000 mg | ORAL_TABLET | Freq: Four times a day (QID) | ORAL | 0 refills | Status: DC | PRN

## 2023-05-17 NOTE — Telephone Encounter (Signed)
Medication Refill - Medication: Oxycodone HCl 10 MG TABS [[id number]]  Patient took his last pill on Sunday  Has the patient contacted their pharmacy? Yes.   (Agent: If no, request that the patient contact the pharmacy for the refill. If patient does not wish to contact the pharmacy document the reason why and proceed with request.) (Agent: If yes, when and what did the pharmacy advise?)  Preferred Pharmacy (with phone number or street name):  [id number] - [place],  - [address] [place] [address] AT [facility] [address] [facility] [address] Phone: [phone]  Fax: [phone]     Has the patient been seen for an appointment in the last year OR does the patient have an upcoming appointment? Yes.    Agent: Please be advised that RX refills may take up to 3 business days. We ask that you follow-up with your pharmacy.

## 2023-05-17 NOTE — Telephone Encounter (Signed)
Already requested today and routed to the provider for approval in a separate refill encounter.

## 2023-05-17 NOTE — Telephone Encounter (Signed)
  Please send refill requests for the following medications:       Oxycodone HCl 10 MG TABS [Angela Bacigalupo] Would like to pick up at [facility] on Monday, May 17, 2023.  Thanks.        Preferred pharmacy: [id number] - [place], [place] - [address] [place] [address] AT [facility] [address] [place] [address] Delivery method: [employer]

## 2023-05-17 NOTE — Telephone Encounter (Signed)
Refill request send to provider.

## 2023-05-18 DIAGNOSIS — G629 Polyneuropathy, unspecified: Secondary | ICD-10-CM | POA: Diagnosis not present

## 2023-05-18 DIAGNOSIS — R42 Dizziness and giddiness: Secondary | ICD-10-CM | POA: Diagnosis not present

## 2023-05-18 DIAGNOSIS — R453 Demoralization and apathy: Secondary | ICD-10-CM | POA: Diagnosis not present

## 2023-05-19 ENCOUNTER — Encounter: Payer: Self-pay | Admitting: Gastroenterology

## 2023-05-19 ENCOUNTER — Other Ambulatory Visit
Admission: RE | Admit: 2023-05-19 | Discharge: 2023-05-19 | Disposition: A | Discharge status: Home / Self Care | Payer: Medicare Other | Attending: Physician Assistant | Admitting: Physician Assistant

## 2023-05-19 ENCOUNTER — Telehealth: Payer: Self-pay | Admitting: Cardiovascular Disease

## 2023-05-19 DIAGNOSIS — Z79899 Other long term (current) drug therapy: Secondary | ICD-10-CM | POA: Insufficient documentation

## 2023-05-19 LAB — BASIC METABOLIC PANEL
Anion gap: 7 (ref 5–15)
BUN: 18 mg/dL (ref 8–23)
CO2: 25 mmol/L (ref 22–32)
Calcium: 9 mg/dL (ref 8.9–10.3)
Chloride: 103 mmol/L (ref 98–111)
Creatinine, Ser: 1.05 mg/dL (ref 0.61–1.24)
GFR, Estimated: >60 mL/min (ref >60)
Glucose, Bld: 109 mg/dL — ABNORMAL HIGH (ref 70–99)
Potassium: 4.4 mmol/L (ref 3.5–5.1)
Sodium: 135 mmol/L (ref 135–145)

## 2023-05-19 NOTE — Addendum Note (Signed)
Addended by: Lonell Face C on: 05/19/2023 01:17 PM   Modules accepted: Orders

## 2023-05-19 NOTE — Telephone Encounter (Signed)
Patient is calling stating he has paperwork requesting he have a carotid study performed within a year that was dated back to his last visit with Tereso Newcomer in 04/2022.   There is not current active order to schedule. Please advise.

## 2023-05-24 MED ORDER — PANCRELIPASE (LIP-PROT-AMYL) 36000-114000 UNITS PO CPEP: ORAL_CAPSULE | ORAL | 11 refills | Status: DC

## 2023-05-24 NOTE — Addendum Note (Signed)
Addended by: Roena Malady on: 05/24/2023 10:14 AM   Modules accepted: Orders

## 2023-05-25 ENCOUNTER — Ambulatory Visit: Payer: Medicare Other

## 2023-05-25 VITALS — BP 127/84 | Ht 69.0 in | Wt 164.0 lb

## 2023-05-25 DIAGNOSIS — Z0000 Encounter for general adult medical examination without abnormal findings: Secondary | ICD-10-CM

## 2023-05-25 NOTE — Patient Instructions (Addendum)
Brian [name] , Thank you for taking time to come for your Medicare Wellness Visit. I appreciate your ongoing commitment to your health goals. Please review the following plan we discussed and let me know if I can assist you in the future.   Referrals/Orders/Follow-Ups/Clinician Recommendations: none  This is a list of the screening recommended for you and due dates:  Health Maintenance  Topic Date Due   Zoster (Shingles) Vaccine (1 of 2) 07/01/1960   DTaP/Tdap/Td vaccine (1 - Tdap) 12/20/2003   Colon Cancer Screening  06/04/2020   COVID-19 Vaccine (10 - 2023-24 season) 07/08/2023   Medicare Annual Wellness Visit  05/24/2024   Pneumonia Vaccine  Completed   Flu Shot  Completed   HPV Vaccine  Aged Out    Advanced directives: (ACP Link)Information on Advanced Care Planning can be found at [facility] [place] Advance Health Care Directives Advance Health Care Directives ([email or web address]   Next Medicare Annual Wellness Visit scheduled for next year: Yes   05/30/24 @ 2:10 pm in person

## 2023-05-25 NOTE — Progress Notes (Signed)
Subjective:   Brian [name] is a 82 y.o. male who presents for Medicare Annual/Subsequent preventive examination.  Visit Complete: Virtual I connected with  Brian [name] on 05/25/23 by a audio enabled telemedicine application and verified that I am speaking with the correct person using two identifiers.  Patient Location: Home  Provider Location: Office/Clinic  I discussed the limitations of evaluation and management by telemedicine. The patient expressed understanding and agreed to proceed.  Vital Signs: Because this visit was a virtual/telehealth visit, some criteria may be missing or patient reported. Any vitals not documented were not able to be obtained and vitals that have been documented are patient reported.  Cardiac Risk Factors include: advanced age (>1men, >65 women);dyslipidemia;male gender;hypertension;sedentary lifestyle     Objective:    Today's Vitals   05/25/23 1525  BP: 127/84  Weight: 164 lb (74.4 kg)  Height: 5\' 9"  (1.753 m)   Body mass index is 24.22 kg/m.     05/25/2023    3:15 PM 08/27/2022    9:00 AM 08/26/2022    2:54 PM 08/25/2022    9:28 AM 05/16/2022    2:07 PM 06/05/2019    9:11 AM 02/14/2016   11:14 PM  Advanced Directives  Does Patient Have a Medical Advance Directive? No No Yes Yes No No;Yes No  Type of Best boy of [place];Living will   Healthcare Power of [place];Living will   Does patient want to make changes to medical advance directive?      No - Patient declined   Would patient like information on creating a medical advance directive? No - Patient declined No - Patient declined   No - Patient declined      Current Medications (verified) Outpatient Encounter Medications as of 05/25/2023  Medication Sig   amLODipine (NORVASC) 10 MG tablet Take 1 tablet (10 mg total) by mouth daily.   aspirin 81 MG tablet Take 81 mg by mouth at bedtime.   atorvastatin (LIPITOR) 40 MG tablet TAKE 1 TABLET BY MOUTH AT   BEDTIME   Cholecalciferol (VITAMIN D3) 50 MCG (2000 UT) CAPS Take by mouth daily.   Coenzyme Q10 (COQ10) 100 MG CAPS Take 1 capsule by mouth daily.     ELIQUIS 5 MG TABS tablet TAKE 1 TABLET(5 MG) BY MOUTH TWICE DAILY   Multiple Vitamin (MULTIVITAMIN PO) Take 1 tablet by mouth daily.   nitroGLYCERIN (NITROSTAT) 0.4 MG SL tablet Place 1 tablet (0.4 mg total) under the tongue every 5 (five) minutes as needed for chest pain.   Oxycodone HCl 10 MG TABS Take 1-2 tablets (10-20 mg total) by mouth every 6 (six) hours as needed (moderate to severe pain).   saccharomyces boulardii (FLORASTOR) 250 MG capsule Take 250 mg by mouth 2 (two) times daily.   ciprofloxacin (CIPRO) 500 MG tablet Take 1 tablet (500 mg total) by mouth 2 (two) times daily. (Patient not taking: Reported on 05/25/2023)   hydrALAZINE (APRESOLINE) 25 MG tablet Take 1 tablet (25 mg total) by mouth 3 (three) times daily. Take as needed for blood pressure greater then 140.   lipase/protease/amylase (CREON) 36000 UNITS CPEP capsule Take 1 capsule (36,000 Units total) by mouth 3 (three) times daily with meals. May also take 1 capsule (36,000 Units total) as needed (with snacks up to 4 snacks daily). (Patient not taking: Reported on 05/25/2023)   meclizine (ANTIVERT) 25 MG tablet Take 1 tablet (25 mg total) by mouth 2 (two) times daily as needed for  dizziness. (Patient not taking: Reported on 05/25/2023)   metFORMIN (GLUCOPHAGE) 500 MG tablet Take 1 tablet (500 mg total) by mouth 2 (two) times daily with a meal. (Patient not taking: Reported on 04/20/2023)   metroNIDAZOLE (FLAGYL) 500 MG tablet Take 1 tablet (500 mg total) by mouth 2 (two) times daily. (Patient not taking: Reported on 05/25/2023)   [DISCONTINUED] metoprolol succinate (TOPROL-XL) 25 MG 24 hr tablet TAKE 1 TABLET(25 MG) BY MOUTH DAILY (Patient not taking: Reported on 04/20/2023)   Facility-Administered Encounter Medications as of 05/25/2023  Medication   albuterol (PROVENTIL) (2.5 MG/3ML)  0.083% nebulizer solution 2.5 mg    Allergies (verified) Patient has no known allergies.   History: Past Medical History:  Diagnosis Date   AAA (abdominal aortic aneurysm) (HCC)    Adenomatous polyp    Anxiety    Carpal tunnel syndrome on left    Cervical spine disease    Colitis    Coronary artery disease    S/p anterior MI tx with PCI in 1998  //  Myoview 6/22: EF 45, ant and septal scar; no ischemia; intermediate risk // Echocardiogram 7/22: EF 45-50, ant-sept and apical HK, Gr 1 DD, normal RVSF, mild MR   Deviated septum    Hyperlipidemia    Hypertension    Renal Artery [place] 12/2022: no RAS bilaterally   Ischemic cardiomyopathy 05/19/2022   Echocardiogram 06/02/22: EF 45-50, global HK, Gr 1 DD, NL RVSF, NL PASP (RVSP 26.4), trivial MR, RAP 3   Low back pain    Myocardial infarction (HCC) 1998   Personal history of pulmonary embolism    Pulmonary embolism (HCC) 08/26/2022   Sleep apnea    CPAP   Wears dentures    full upper   Past Surgical History:  Procedure Laterality Date   APPENDECTOMY     AQUEOUS SHUNT Right 10/26/2022   Procedure: AHMED TUBE SHUNT WITH TUTOPLAST RIGHT;  Surgeon: Nevada Crane, MD;  Location: [facility];  Service: Ophthalmology;  Laterality: Right;   bunions     CATARACT EXTRACTION     COLONOSCOPY WITH PROPOFOL N/A 02/19/2015   Procedure: COLONOSCOPY WITH PROPOFOL;  Surgeon: Midge Minium, MD;  Location: [facility];  Service: Endoscopy;  Laterality: N/A;   COLONOSCOPY WITH PROPOFOL N/A 06/05/2019   Procedure: COLONOSCOPY WITH BIOPSY;  Surgeon: Midge Minium, MD;  Location: [facility];  Service: Endoscopy;  Laterality: N/A;  sleep apnea   ERCP N/A 08/25/2022   Procedure: ENDOSCOPIC RETROGRADE CHOLANGIOPANCREATOGRAPHY (ERCP);  Surgeon: Midge Minium, MD;  Location: [facility];  Service: Endoscopy;  Laterality: N/A;   HEMORRHOID BANDING     HERNIA REPAIR     x3   OLECRANON BURSECTOMY Right    POLYPECTOMY N/A 06/05/2019    Procedure: POLYPECTOMY;  Surgeon: Midge Minium, MD;  Location: [facility];  Service: Endoscopy;  Laterality: N/A;   Family History  Problem Relation Age of Onset   Heart failure Mother    Kidney disease Father    Hypertension Father    Coronary artery disease Brother    Alzheimer's disease Maternal Grandmother    Colon cancer Maternal Grandfather    Social History   Socioeconomic History   Marital status: Married    Spouse name: [name]   Number of children: 1   Years of education: Not on file   Highest education level: 12th grade  Occupational History   Occupation: retired    Comment: Electronics engineer  Tobacco Use   Smoking status: Former  Current packs/day: 0.00    Average packs/day: 0.5 packs/day for 60.0 years (30.0 ttl pk-yrs)    Types: Cigarettes    Start date: 08/26/1962    Quit date: 08/26/2022    Years since quitting: 0.7   Smokeless tobacco: Never   Tobacco comments:    1/2 3/4 ppd (since age 79)  Vaping Use   Vaping status: Never Used  Substance and Sexual Activity   Alcohol use: Not Currently    Comment: occasionally, "none   Drug use: No   Sexual activity: Yes    Partners: Female  Other Topics Concern   Not on file  Social History Narrative   Not on file   Social Determinants of Health   Financial Resource Strain: Low Risk  (05/25/2023)   Overall Financial Resource Strain (CARDIA)    Difficulty of Paying Living Expenses: Not hard at all  Food Insecurity: No Food Insecurity (05/25/2023)   Hunger Vital Sign    Worried About Running Out of Food in the Last Year: Never true    Ran Out of Food in the Last Year: Never true  Transportation Needs: No Transportation Needs (05/25/2023)   PRAPARE - Administrator, Civil Service (Medical): No    Lack of Transportation (Non-Medical): No  Physical Activity: Inactive (05/25/2023)   Exercise Vital Sign    Days of Exercise per Week: 0 days    Minutes of Exercise per Session: 0  min  Stress: No Stress Concern Present (05/25/2023)   [employer] of Occupational Health - Occupational Stress Questionnaire    Feeling of Stress : Only a little  Social Connections: Moderately Isolated (05/25/2023)   Social Connection and Isolation Panel [NHANES]    Frequency of Communication with Friends and Family: More than three times a week    Frequency of Social Gatherings with Friends and Family: Once a week    Attends Religious Services: Never    Database administrator or Organizations: No    Attends Engineer, structural: Never    Marital Status: Married    Tobacco Counseling Counseling given: Not Answered Tobacco comments: 1/2 3/4 ppd (since age 63)   Clinical Intake:  Pre-visit preparation completed: Yes  Pain : No/denies pain     Nutritional Status: BMI of 19-24  Normal Nutritional Risks: None Diabetes: No  How often do you need to have someone help you when you read instructions, pamphlets, or other written materials from your doctor or pharmacy?: 1 - Never  Interpreter Needed?: No  Information entered by :: Kennedy Bucker, LPN   Activities of Daily Living    05/25/2023    3:17 PM 05/25/2023   10:47 AM  In your present state of health, do you have any difficulty performing the following activities:  Hearing? 0 0  Vision? 1 0  Difficulty concentrating or making decisions? 0 0  Walking or climbing stairs? 0 1  Dressing or bathing? 0 0  Doing errands, shopping? 0 0  Preparing Food and eating ? N N  Using the Toilet? N N  In the past six months, have you accidently leaked urine? N N  Do you have problems with loss of bowel control? N N  Managing your Medications? N N  Managing your Finances? N N  Housekeeping or managing your Housekeeping? N N    Patient Care Team: Erasmo Downer, MD as PCP - General (Family Medicine) Lyn Records, MD (Inactive) as PCP - Cardiology (Cardiology) Nevada Crane,  MD as Consulting Physician  (Ophthalmology)  Indicate any recent Medical Services you may have received from other than [facility] providers in the past year (date may be approximate).     Assessment:   This is a routine wellness examination for Salah.  Hearing/Vision screen Hearing Screening - Comments:: No aids Vision Screening - Comments:: Wears glasses, glaucoma - Dr.B.King Right eye  70%  Left eye 100%   Goals Addressed             This Visit's Progress    DIET - EAT MORE FRUITS AND VEGETABLES         Depression Screen    05/25/2023    3:13 PM 02/11/2023   11:05 AM 11/30/2022    2:16 PM 09/22/2022    4:09 PM  PHQ 2/9 Scores  PHQ - 2 Score 1 0  0  PHQ- 9 Score 1 0  0  Exception Documentation   Patient refusal     Fall Risk    05/25/2023    3:16 PM 05/25/2023   10:47 AM 02/11/2023   11:05 AM 11/30/2022    2:16 PM 09/22/2022    4:08 PM  Fall Risk   Falls in the past year? 0 0 0 0 0  Number falls in past yr: 0 0 0 0 0  Injury with Fall? 0 0 0 0 0  Risk for fall due to : No Fall Risks  No Fall Risks No Fall Risks   Follow up Falls prevention discussed;Falls evaluation completed  Falls evaluation completed Falls evaluation completed Falls evaluation completed    MEDICARE RISK AT HOME: Medicare Risk at Home Any stairs in or around the home?: Yes If so, are there any without handrails?: No Home free of loose throw rugs in walkways, pet beds, electrical cords, etc?: Yes Adequate lighting in your home to reduce risk of falls?: Yes Life alert?: No Use of a cane, walker or w/c?: No Grab bars in the bathroom?: Yes Shower chair or bench in shower?: Yes Elevated toilet seat or a handicapped toilet?: No  TIMED UP AND GO:  Was the test performed?  No    Cognitive Function:        05/25/2023    3:18 PM  6CIT Screen  What Year? 0 points  What month? 0 points  What time? 0 points  Count back from 20 0 points  Months in reverse 0 points  Repeat phrase 0 points  Total Score 0 points     Immunizations Immunization History  Administered Date(s) Administered   Fluad Quad(high Dose 65+) 03/22/2019, 04/25/2021, 05/10/2022   Fluad Trivalent(High Dose 65+) 04/20/2023   Influenza Split 04/19/2012, 05/03/2013   Influenza, High Dose Seasonal PF 04/22/2016, 04/14/2017, 03/30/2018   Influenza-Unspecified 05/11/2017, 04/19/2018, 05/27/2020, 05/27/2020, 05/10/2022   PFIZER Comirnaty(Gray Top)Covid-19 Tri-Sucrose Vaccine 12/25/2020, 05/10/2022   PFIZER(Purple Top)SARS-COV-2 Vaccination 10/22/2019, 11/19/2019, 08/07/2020, 12/25/2020   Pfizer Covid-19 Vaccine Bivalent Booster 73yrs & up 04/25/2021, 03/06/2022   Pfizer(Comirnaty)Fall Seasonal Vaccine 12 years and older 05/10/2022, 05/13/2023   Pneumococcal Conjugate-13 05/18/2013   Pneumococcal Polysaccharide-23 10/23/2005   Td (Adult) 12/19/2003   Zoster, Live 11/09/2016, 05/12/2017    TDAP status: Due, Education has been provided regarding the importance of this vaccine. Advised may receive this vaccine at local pharmacy or Health Dept. Aware to provide a copy of the vaccination record if obtained from local pharmacy or Health Dept. Verbalized acceptance and understanding.  Flu Vaccine status: Up to date  Pneumococcal vaccine status: Up to  date  Covid-19 vaccine status: Completed vaccines  Qualifies for Shingles Vaccine? Yes   Zostavax completed Yes   Shingrix Completed?: No.    Education has been provided regarding the importance of this vaccine. Patient has been advised to call insurance company to determine out of pocket expense if they have not yet received this vaccine. Advised may also receive vaccine at local pharmacy or Health Dept. Verbalized acceptance and understanding.  Screening Tests Health Maintenance  Topic Date Due   Zoster Vaccines- Shingrix (1 of 2) 07/01/1960   DTaP/Tdap/Td (1 - Tdap) 12/20/2003   Colonoscopy  06/04/2020   COVID-19 Vaccine (10 - 2023-24 season) 07/08/2023   Medicare Annual Wellness  (AWV)  05/24/2024   Pneumonia Vaccine 63+ Years old  Completed   INFLUENZA VACCINE  Completed   HPV VACCINES  Aged Out    Health Maintenance  Health Maintenance Due  Topic Date Due   Zoster Vaccines- Shingrix (1 of 2) 07/01/1960   DTaP/Tdap/Td (1 - Tdap) 12/20/2003   Colonoscopy  06/04/2020    Colorectal cancer screening: No longer required.   Lung Cancer Screening: (Low Dose CT Chest recommended if Age 54-80 years, 20 pack-year currently smoking OR have quit w/in 15years.) does not qualify.    Additional Screening:  Hepatitis C Screening: does not qualify; Completed no  Vision Screening: Recommended annual ophthalmology exams for early detection of glaucoma and other disorders of the eye. Is the patient up to date with their annual eye exam?  Yes  Who is the provider or what is the name of the office in which the patient attends annual eye exams? Dr.King If pt is not established with a provider, would they like to be referred to a provider to establish care? No .   Dental Screening: Recommended annual dental exams for proper oral hygiene  Community Resource Referral / Chronic Care Management: CRR required this visit?  No   CCM required this visit?  No     Plan:     I have personally reviewed and noted the following in the patient's chart:   Medical and social history Use of alcohol, tobacco or illicit drugs  Current medications and supplements including opioid prescriptions. Patient is currently taking opioid prescriptions. Information provided to patient regarding non-opioid alternatives. Patient advised to discuss non-opioid treatment plan with their provider. Functional ability and status Nutritional status Physical activity Advanced directives List of other physicians Hospitalizations, surgeries, and ER visits in previous 12 months Vitals Screenings to include cognitive, depression, and falls Referrals and appointments  In addition, I have reviewed and  discussed with patient certain preventive protocols, quality metrics, and best practice recommendations. A written personalized care plan for preventive services as well as general preventive health recommendations were provided to patient.     Hal Hope, LPN   27/08/5364   After Visit Summary: (MyChart) Due to this being a telephonic visit, the after visit summary with patients personalized plan was offered to patient via MyChart   Nurse Notes: none

## 2023-05-26 DIAGNOSIS — R531 Weakness: Secondary | ICD-10-CM | POA: Diagnosis not present

## 2023-05-26 DIAGNOSIS — H814 Vertigo of central origin: Secondary | ICD-10-CM | POA: Diagnosis not present

## 2023-05-26 DIAGNOSIS — R2681 Unsteadiness on feet: Secondary | ICD-10-CM | POA: Diagnosis not present

## 2023-05-31 ENCOUNTER — Other Ambulatory Visit: Payer: Self-pay

## 2023-06-03 ENCOUNTER — Other Ambulatory Visit: Payer: Self-pay

## 2023-06-03 ENCOUNTER — Telehealth: Payer: Self-pay | Admitting: Family Medicine

## 2023-06-03 MED ORDER — APIXABAN 5 MG PO TABS: 5.0000 mg | ORAL_TABLET | Freq: Two times a day (BID) | ORAL | 0 refills | Status: DC

## 2023-06-03 MED ORDER — ATORVASTATIN CALCIUM 40 MG PO TABS: 40.0000 mg | ORAL_TABLET | Freq: Every day | ORAL | 1 refills | Status: DC

## 2023-06-03 MED ORDER — AMLODIPINE BESYLATE 10 MG PO TABS: 10.0000 mg | ORAL_TABLET | Freq: Every day | ORAL | 0 refills | Status: DC

## 2023-06-03 NOTE — Telephone Encounter (Signed)
Received fax from [facility] asking for refills on:  Eliquis 5 mg.  Atorvastatin 40 mg.

## 2023-06-07 ENCOUNTER — Ambulatory Visit (INDEPENDENT_AMBULATORY_CARE_PROVIDER_SITE_OTHER): Payer: Medicare Other | Admitting: Family Medicine

## 2023-06-07 ENCOUNTER — Encounter: Payer: Medicare Other | Admitting: Family Medicine

## 2023-06-07 ENCOUNTER — Encounter: Payer: Self-pay | Admitting: Family Medicine

## 2023-06-07 VITALS — BP 104/68 | HR 65 | Ht 70.0 in | Wt 165.0 lb

## 2023-06-07 DIAGNOSIS — G894 Chronic pain syndrome: Secondary | ICD-10-CM

## 2023-06-07 DIAGNOSIS — R7303 Prediabetes: Secondary | ICD-10-CM | POA: Diagnosis not present

## 2023-06-07 DIAGNOSIS — I10 Essential (primary) hypertension: Secondary | ICD-10-CM | POA: Diagnosis not present

## 2023-06-07 DIAGNOSIS — M7989 Other specified soft tissue disorders: Secondary | ICD-10-CM

## 2023-06-07 DIAGNOSIS — N402 Nodular prostate without lower urinary tract symptoms: Secondary | ICD-10-CM

## 2023-06-07 DIAGNOSIS — E7800 Pure hypercholesterolemia, unspecified: Secondary | ICD-10-CM

## 2023-06-07 DIAGNOSIS — Z0000 Encounter for general adult medical examination without abnormal findings: Secondary | ICD-10-CM

## 2023-06-07 MED ORDER — OXYCODONE HCL 10 MG PO TABS: 10.0000 mg | ORAL_TABLET | Freq: Four times a day (QID) | ORAL | 0 refills | Status: DC | PRN

## 2023-06-07 NOTE — Assessment & Plan Note (Signed)
Has been followed by Urology for many years His urologist has retired at [employer] - will refer locally

## 2023-06-07 NOTE — Assessment & Plan Note (Signed)
Previously well controlled Continue statin Repeat FLP and CMP Goal LDL < 70 

## 2023-06-07 NOTE — Assessment & Plan Note (Signed)
Blood pressure is well-controlled on current medications. - Continue amlodipine 10 mg daily - Continue hydralazine 25 mg three times a day as needed

## 2023-06-07 NOTE — Assessment & Plan Note (Signed)
Previous A1c was 6.4%. Has made dietary changes to reduce sugar intake. Will recheck labs to monitor progress. Discussed the importance of monitoring A1c as a three-month average of blood glucose levels. - Order hemoglobin A1c - Order fasting glucose - Order lipid panel - Order kidney and liver function tests

## 2023-06-07 NOTE — Assessment & Plan Note (Signed)
Back pain as above Signed pain contract Continue oxycodone at current dose 

## 2023-06-07 NOTE — Progress Notes (Signed)
Complete physical exam  Patient: Brian [name]   DOB: 12-21-1940   82 y.o. Male  MRN: [id number]  Subjective:    Chief Complaint  Patient presents with   Annual Exam    AWV 05/25/23. Patient reports reduced sugar diet and no exercise to report outside of daily living. He reports feeling well and sleeping fairly to poorly depending on the night due to pain. He reports no concerns.     Brian [name] is a 82 y.o. male who presents today for a complete physical exam. Discussed the use of AI scribe software for clinical note transcription with the patient, who gave verbal consent to proceed.  History of Present Illness   The patient, with a history of leg and ankle surgeries, presents with increased swelling in the right foot, which is causing discomfort by the end of the day. The patient reports that the swelling is more pronounced in the right foot compared to the left. The patient also mentions a new tingling type pain in the hip area, which has been increasing over the past few days. The patient has a history of receiving successful treatment for similar hip pain many years ago. The patient's spouse expresses concern about the appearance of bags under the patient's eyes, suspecting he may contain fluid. The patient denies any discomfort or vision changes related to this. The patient is currently on medication for blood pressure and has a history of prediabetes, but is not taking metformin due to concerns about side effects. The patient reports a significant reduction in candy consumption, which was previously a large part of his diet.        Most recent fall risk assessment:    05/25/2023    3:16 PM  Fall Risk   Falls in the past year? 0  Number falls in past yr: 0  Injury with Fall? 0  Risk for fall due to : No Fall Risks  Follow up Falls prevention discussed;Falls evaluation completed     Most recent depression screenings:    05/25/2023    3:13 PM 02/11/2023   11:05 AM  PHQ  2/9 Scores  PHQ - 2 Score 1 0  PHQ- 9 Score 1 0        Patient Care Team: Erasmo Downer, MD as PCP - General (Family Medicine) Lyn Records, MD (Inactive) as PCP - Cardiology (Cardiology) Nevada Crane, MD as Consulting Physician (Ophthalmology)   Outpatient Medications Prior to Visit  Medication Sig   amLODipine (NORVASC) 10 MG tablet Take 1 tablet (10 mg total) by mouth daily.   apixaban (ELIQUIS) 5 MG TABS tablet Take 1 tablet (5 mg total) by mouth 2 (two) times daily.   aspirin 81 MG tablet Take 81 mg by mouth at bedtime.   atorvastatin (LIPITOR) 40 MG tablet Take 1 tablet (40 mg total) by mouth at bedtime.   Cholecalciferol (VITAMIN D3) 50 MCG (2000 UT) CAPS Take by mouth daily.   Coenzyme Q10 (COQ10) 100 MG CAPS Take 1 capsule by mouth daily.     lipase/protease/amylase (CREON) 36000 UNITS CPEP capsule Take 1 capsule (36,000 Units total) by mouth 3 (three) times daily with meals. May also take 1 capsule (36,000 Units total) as needed (with snacks up to 4 snacks daily).   meclizine (ANTIVERT) 25 MG tablet Take 1 tablet (25 mg total) by mouth 2 (two) times daily as needed for dizziness.   metroNIDAZOLE (FLAGYL) 500 MG tablet Take 1 tablet (500 mg total)  by mouth 2 (two) times daily.   Multiple Vitamin (MULTIVITAMIN PO) Take 1 tablet by mouth daily.   nitroGLYCERIN (NITROSTAT) 0.4 MG SL tablet Place 1 tablet (0.4 mg total) under the tongue every 5 (five) minutes as needed for chest pain.   saccharomyces boulardii (FLORASTOR) 250 MG capsule Take 250 mg by mouth 2 (two) times daily.   [DISCONTINUED] ciprofloxacin (CIPRO) 500 MG tablet Take 1 tablet (500 mg total) by mouth 2 (two) times daily.   [DISCONTINUED] Oxycodone HCl 10 MG TABS Take 1-2 tablets (10-20 mg total) by mouth every 6 (six) hours as needed (moderate to severe pain).   hydrALAZINE (APRESOLINE) 25 MG tablet Take 1 tablet (25 mg total) by mouth 3 (three) times daily. Take as needed for blood pressure greater  then 140.   [DISCONTINUED] metFORMIN (GLUCOPHAGE) 500 MG tablet Take 1 tablet (500 mg total) by mouth 2 (two) times daily with a meal. (Patient not taking: Reported on 06/07/2023)   Facility-Administered Medications Prior to Visit  Medication Dose Route Frequency Provider   albuterol (PROVENTIL) (2.5 MG/3ML) 0.083% nebulizer solution 2.5 mg  2.5 mg Nebulization Once Stoneking, Hal, MD    ROS        Objective:     BP 104/68 (BP Location: Right Arm, Patient Position: Sitting, Cuff Size: Normal)   Pulse 65   Ht 5\' 10"  (1.778 m)   Wt 165 lb (74.8 kg)   SpO2 94%   BMI 23.68 kg/m    Physical Exam Vitals reviewed.  Constitutional:      General: He is not in acute distress.    Appearance: Normal appearance. He is well-developed. He is not diaphoretic.  HENT:     Head: Normocephalic and atraumatic.     Right Ear: Tympanic membrane, ear canal and external ear normal.     Left Ear: Tympanic membrane, ear canal and external ear normal.     Nose: Nose normal.     Mouth/Throat:     Mouth: Mucous membranes are moist.     Pharynx: Oropharynx is clear. No oropharyngeal exudate.  Eyes:     General: No scleral icterus.    Conjunctiva/sclera: Conjunctivae normal.     Pupils: Pupils are equal, round, and reactive to light.  Neck:     Thyroid: No thyromegaly.  Cardiovascular:     Rate and Rhythm: Normal rate and regular rhythm.     Heart sounds: Normal heart sounds. No murmur heard. Pulmonary:     Effort: Pulmonary effort is normal. No respiratory distress.     Breath sounds: Normal breath sounds. No wheezing or rales.  Abdominal:     General: There is no distension.     Palpations: Abdomen is soft.     Tenderness: There is no abdominal tenderness.  Musculoskeletal:     Cervical back: Neck supple.     Right lower leg: Edema (1+) present.     Left lower leg: Edema (trace) present.  Lymphadenopathy:     Cervical: No cervical adenopathy.  Skin:    General: Skin is warm and dry.      Findings: No rash.  Neurological:     Mental Status: He is alert and oriented to person, place, and time. Mental status is at baseline.     Gait: Gait normal.  Psychiatric:        Mood and Affect: Mood normal.        Behavior: Behavior normal.        Thought Content: Thought content normal.  No results found for any visits on 06/07/23.     Assessment & Plan:    Routine Health Maintenance and Physical Exam  Immunization History  Administered Date(s) Administered   Fluad Quad(high Dose 65+) 03/22/2019, 04/25/2021, 05/10/2022   Fluad Trivalent(High Dose 65+) 04/20/2023   Influenza Split 04/19/2012, 05/03/2013   Influenza, High Dose Seasonal PF 04/22/2016, 04/14/2017, 03/30/2018   Influenza-Unspecified 05/11/2017, 04/19/2018, 05/27/2020, 05/27/2020, 05/10/2022   PFIZER Comirnaty(Gray Top)Covid-19 Tri-Sucrose Vaccine 12/25/2020, 05/10/2022   PFIZER(Purple Top)SARS-COV-2 Vaccination 10/22/2019, 11/19/2019, 08/07/2020, 12/25/2020   Pfizer Covid-19 Vaccine Bivalent Booster 14yrs & up 04/25/2021, 03/06/2022   Pfizer(Comirnaty)Fall Seasonal Vaccine 12 years and older 05/10/2022, 05/13/2023   Pneumococcal Conjugate-13 05/18/2013   Pneumococcal Polysaccharide-23 10/23/2005   Td (Adult) 12/19/2003   Zoster, Live 11/09/2016, 05/12/2017    Health Maintenance  Topic Date Due   Zoster Vaccines- Shingrix (1 of 2) [birth year]/1959   DTaP/Tdap/Td (1 - Tdap) 12/20/2003   Colonoscopy  06/04/2020   COVID-19 Vaccine (10 - 2023-24 season) 07/08/2023   Medicare Annual Wellness (AWV)  05/24/2024   Pneumonia Vaccine 59+ Years old  Completed   INFLUENZA VACCINE  Completed   HPV VACCINES  Aged Out    Discussed health benefits of physical activity, and encouraged him to engage in regular exercise appropriate for his age and condition.  Problem List Items Addressed This Visit       Cardiovascular and Mediastinum   Hypertension    Blood pressure is well-controlled on current medications. -  Continue amlodipine 10 mg daily - Continue hydralazine 25 mg three times a day as needed      Relevant Orders   Comprehensive metabolic panel     Genitourinary   Prostate nodule    Has been followed by Urology for many years His urologist has retired at [employer] - will refer locally      Relevant Orders   Ambulatory referral to Urology     Other   Hyperlipidemia    Previously well controlled Continue statin Repeat FLP and CMP Goal LDL < 70      Relevant Orders   Comprehensive metabolic panel   Lipid Panel With LDL/HDL Ratio   Chronic pain syndrome    Back pain as above Signed pain contract Continue oxycodone at current dose      Relevant Medications   Oxycodone HCl 10 MG TABS   Prediabetes    Previous A1c was 6.4%. Has made dietary changes to reduce sugar intake. Will recheck labs to monitor progress. Discussed the importance of monitoring A1c as a three-month average of blood glucose levels. - Order hemoglobin A1c - Order fasting glucose - Order lipid panel - Order kidney and liver function tests      Relevant Orders   Hemoglobin A1c   Other Visit Diagnoses     Encounter for annual physical exam    -  Primary   Swelling of right lower extremity       Relevant Orders   US Venous Img Lower Unilateral Right (DVT)           Asymmetric Lower Extremity Swelling Significant right foot swelling, particularly by nighttime, causing pain. Left foot also shows some swelling but less severe. Differential diagnosis includes venous insufficiency and DVT. Given the asymmetry and history of trauma and surgery, an ultrasound is necessary to rule out DVT. The patient is on Eliquis, which reduces but does not eliminate the risk of a clot. Discussed the risks of missing a DVT and benefits of early detection  through ultrasound. Advised against wearing compression socks overnight due to risk of impaired circulation. - Order ultrasound of the right leg to rule out DVT - Provide  phone number for podiatry referral - see previous note regarding referral indication - Advise against wearing compression socks overnight  Toenail Abnormality Reports toenail lifting with abnormal growth underneath. Requires evaluation by a podiatrist. - Provide phone number for podiatry referral  Chronic Low Back Pain with Radiculopathy Has scoliosis and reports new tingling pain in the hip area, likely related to the back. Previous epidural steroid injections have been effective. Discussed the option of repeating the injection given the recurrence of symptoms. - Refer to pain management for evaluation and possible epidural steroid injection  General Health Maintenance Routine wellness visit completed earlier this month. No additional screenings or vaccinations discussed. - None  Follow-up - Follow up in three months for routine check-up - Follow up with urology for annual check-up - Follow up with podiatry for toenail and foot swelling - Follow up with pain management for back pain - Get labs done at any [facility] location.        Return in about 3 months (around 09/07/2023) for chronic disease f/u.     Shirlee Latch, MD

## 2023-06-08 ENCOUNTER — Other Ambulatory Visit
Admission: RE | Admit: 2023-06-08 | Discharge: 2023-06-08 | Disposition: A | Discharge status: Home / Self Care | Payer: Medicare Other | Attending: Family Medicine | Admitting: Family Medicine

## 2023-06-08 DIAGNOSIS — E7800 Pure hypercholesterolemia, unspecified: Secondary | ICD-10-CM | POA: Diagnosis not present

## 2023-06-08 DIAGNOSIS — R7303 Prediabetes: Secondary | ICD-10-CM | POA: Insufficient documentation

## 2023-06-08 DIAGNOSIS — I10 Essential (primary) hypertension: Secondary | ICD-10-CM | POA: Diagnosis not present

## 2023-06-08 LAB — COMPREHENSIVE METABOLIC PANEL
ALT: 20 U/L (ref 0–44)
AST: 24 U/L (ref 15–41)
Albumin: 4 g/dL (ref 3.5–5.0)
Alkaline Phosphatase: 89 U/L (ref 38–126)
Anion gap: 6 (ref 5–15)
BUN: 16 mg/dL (ref 8–23)
CO2: 25 mmol/L (ref 22–32)
Calcium: 8.9 mg/dL (ref 8.9–10.3)
Chloride: 103 mmol/L (ref 98–111)
Creatinine, Ser: 0.97 mg/dL (ref 0.61–1.24)
GFR, Estimated: >60 mL/min (ref >60)
Glucose, Bld: 88 mg/dL (ref 70–99)
Potassium: 4.5 mmol/L (ref 3.5–5.1)
Sodium: 134 mmol/L — ABNORMAL LOW (ref 135–145)
Total Bilirubin: 0.9 mg/dL (ref <1.2)
Total Protein: 7.7 g/dL (ref 6.5–8.1)

## 2023-06-08 LAB — HEMOGLOBIN A1C
Hgb A1c MFr Bld: 6 % — ABNORMAL HIGH (ref 4.8–5.6)
Mean Plasma Glucose: 125.5 mg/dL

## 2023-06-08 LAB — LIPID PANEL
Cholesterol: 119 mg/dL (ref 0–200)
HDL: 42 mg/dL (ref >40)
LDL Cholesterol: 51 mg/dL (ref 0–99)
Total CHOL/HDL Ratio: 2.8 {ratio}
Triglycerides: 129 mg/dL (ref <150)
VLDL: 26 mg/dL (ref 0–40)

## 2023-06-10 ENCOUNTER — Telehealth: Payer: Self-pay | Admitting: Family Medicine

## 2023-06-10 NOTE — Telephone Encounter (Signed)
Patient wants [place] to send his RX to [facility] only.. Do not fill anything to [facility].

## 2023-06-11 ENCOUNTER — Ambulatory Visit
Admission: RE | Admit: 2023-06-11 | Discharge: 2023-06-11 | Disposition: A | Discharge status: Home / Self Care | Payer: Medicare Other | Source: Ambulatory Visit | Attending: Family Medicine

## 2023-06-11 ENCOUNTER — Telehealth: Payer: Self-pay

## 2023-06-11 DIAGNOSIS — M7989 Other specified soft tissue disorders: Secondary | ICD-10-CM | POA: Insufficient documentation

## 2023-06-11 DIAGNOSIS — R600 Localized edema: Secondary | ICD-10-CM | POA: Diagnosis not present

## 2023-06-11 DIAGNOSIS — I2699 Other pulmonary embolism without acute cor pulmonale: Secondary | ICD-10-CM | POA: Diagnosis not present

## 2023-06-11 NOTE — Telephone Encounter (Signed)
Call from [place] at [facility] with preliminary report from US Venous Img Lower Unilateral Right (DVT).  Negative  - Normal  Brian [name] also noticed a white spot on the right leg(the one that is swollen) with a ring around it, the size of the tip of a pinkie. Medial anterior shin.  Pt has a hx of skin CA.

## 2023-06-11 NOTE — Telephone Encounter (Signed)
[facility] no longer on file

## 2023-06-14 ENCOUNTER — Ambulatory Visit: Payer: Self-pay | Admitting: *Deleted

## 2023-06-14 ENCOUNTER — Telehealth: Payer: Self-pay | Admitting: *Deleted

## 2023-06-14 NOTE — Telephone Encounter (Signed)
Error

## 2023-06-14 NOTE — Telephone Encounter (Signed)
  Chief Complaint: U/S report read to him from Dr. Roxan Hockey Symptoms: N/A Frequency: N/A Pertinent Negatives: Patient denies N/A Disposition: [] ED /[] Urgent Care (no appt availability in office) / [] Appointment(In office/virtual)/ []  [place] [facility]/ [] Home Care/ [] Refused Recommended Disposition /[] [facility]/ [x]  Follow-up with PCP Additional Notes: Appt made with Dr. Sherrie Mustache for skin lesion evaluation.   06/16/2023 8:20.

## 2023-06-14 NOTE — Telephone Encounter (Signed)
It appears patient returned call and someone at [facility] gave results and scheduled appointment with Dr. Sherrie Mustache on 06/16/23 for dermatologic concern

## 2023-06-14 NOTE — Telephone Encounter (Signed)
Please notify patient of negative results of [place]   Please schedule patient with PCP for next available appt to assess derm concern   Ronnald Ramp, MD

## 2023-06-14 NOTE — Telephone Encounter (Signed)
Reason for Disposition  [1] Follow-up call to recent contact AND [2] information only call, no triage required  Answer Assessment - Initial Assessment Questions 1. REASON FOR CALL or QUESTION: "What is your reason for calling today?" or "How can I best help you?" or "What question do you have that I can help answer?"     Pt returned call and was given the ultrasound report from Dr. Roxan Hockey dated 06/14/2023 at 12:49 PM.   Appt scheduled for 06/16/2023 at 8:20 with Dr. Sherrie Mustache for skin lesion.  Protocols used: Information Only Call - No Triage-A-AH

## 2023-06-14 NOTE — Telephone Encounter (Signed)
Pt called for results, disconnected or hung up before transfered to NT. Attempted to reach, left VM to call back.

## 2023-06-14 NOTE — Telephone Encounter (Signed)
Attempted to call patient. No answer. Left VM for patient to return call for results.  PEC you may review results and schedule appointment for his dermatology concern. Thank you!

## 2023-06-16 ENCOUNTER — Encounter: Payer: Self-pay | Admitting: Family Medicine

## 2023-06-16 ENCOUNTER — Ambulatory Visit (INDEPENDENT_AMBULATORY_CARE_PROVIDER_SITE_OTHER): Payer: Medicare Other | Admitting: Family Medicine

## 2023-06-16 VITALS — BP 137/82 | HR 76 | Resp 16 | Wt 166.0 lb

## 2023-06-16 DIAGNOSIS — L989 Disorder of the skin and subcutaneous tissue, unspecified: Secondary | ICD-10-CM

## 2023-06-16 MED ORDER — KETOCONAZOLE 2 % EX CREA: 1.0000 | TOPICAL_CREAM | Freq: Two times a day (BID) | CUTANEOUS | 0 refills | Status: AC

## 2023-06-16 NOTE — Patient Instructions (Signed)
.   Please review the attached list of medications and notify my office if there are any errors.   . Please bring all of your medications to every appointment so we can make sure that our medication list is the same as yours.   

## 2023-06-16 NOTE — Progress Notes (Signed)
Established patient visit   Patient: Brian [name]   DOB: 10-Oct-1940   82 y.o. Male  MRN: [id number] Visit Date: 06/16/2023  Today's healthcare provider: Mila Merry, MD   Chief Complaint  Patient presents with   Skin Lesion   Subjective    HPI  Patient presents to evaluation of dime sized skin lesion on medial aspect of distal lower leg that has been present for several months and grown very slightly over that time. He wasn't concerned about it until he had u\s of leg last week and u/s tech advised him to have it checked out. He does have remote history of SCC on arm, but hasn't seen dermatologist for a few years. He does have dermatology follow up scheduled for March.   Medications: Outpatient Medications Prior to Visit  Medication Sig   amLODipine (NORVASC) 10 MG tablet Take 1 tablet (10 mg total) by mouth daily.   apixaban (ELIQUIS) 5 MG TABS tablet Take 1 tablet (5 mg total) by mouth 2 (two) times daily.   aspirin 81 MG tablet Take 81 mg by mouth at bedtime.   atorvastatin (LIPITOR) 40 MG tablet Take 1 tablet (40 mg total) by mouth at bedtime.   Cholecalciferol (VITAMIN D3) 50 MCG (2000 UT) CAPS Take by mouth daily.   Coenzyme Q10 (COQ10) 100 MG CAPS Take 1 capsule by mouth daily.     lipase/protease/amylase (CREON) 36000 UNITS CPEP capsule Take 1 capsule (36,000 Units total) by mouth 3 (three) times daily with meals. May also take 1 capsule (36,000 Units total) as needed (with snacks up to 4 snacks daily).   meclizine (ANTIVERT) 25 MG tablet Take 1 tablet (25 mg total) by mouth 2 (two) times daily as needed for dizziness.   metroNIDAZOLE (FLAGYL) 500 MG tablet Take 1 tablet (500 mg total) by mouth 2 (two) times daily.   Multiple Vitamin (MULTIVITAMIN PO) Take 1 tablet by mouth daily.   nitroGLYCERIN (NITROSTAT) 0.4 MG SL tablet Place 1 tablet (0.4 mg total) under the tongue every 5 (five) minutes as needed for chest pain.   Oxycodone HCl 10 MG TABS Take 1-2 tablets  (10-20 mg total) by mouth every 6 (six) hours as needed (moderate to severe pain).   saccharomyces boulardii (FLORASTOR) 250 MG capsule Take 250 mg by mouth 2 (two) times daily.   hydrALAZINE (APRESOLINE) 25 MG tablet Take 1 tablet (25 mg total) by mouth 3 (three) times daily. Take as needed for blood pressure greater then 140.   Facility-Administered Medications Prior to Visit  Medication Dose Route Frequency Provider   albuterol (PROVENTIL) (2.5 MG/3ML) 0.083% nebulizer solution 2.5 mg  2.5 mg Nebulization Once Stoneking, Hal, MD    Review of Systems     Objective    BP 137/82 (BP Location: Right Arm, Patient Position: Sitting, Cuff Size: Normal)   Pulse 76   Resp 16   Wt 166 lb (75.3 kg)   SpO2 98%   BMI 23.82 kg/m    Physical Exam   Dime sized lesion left medial lower leg    Assessment & Plan     1. Skin lesion  - ketoconazole (NIZORAL) 2 % cream; Apply 1 Application topically 2 (two) times daily for 21 days.  Dispense: 30 g; Refill: 0  Return to recheck in about 3 weeks. Has dermatology follow up schedule in March. May need to move up if not clearing with antifungal medications.    Return in about 3 weeks (around 07/07/2023),  or Dr. Beryle Flock to check skin lesion.         Mila Merry, MD  [facility] [phone] (phone) [phone] (fax)  [facility]

## 2023-06-28 ENCOUNTER — Telehealth: Payer: Self-pay | Admitting: Family Medicine

## 2023-07-02 DIAGNOSIS — R2681 Unsteadiness on feet: Secondary | ICD-10-CM | POA: Diagnosis not present

## 2023-07-02 DIAGNOSIS — R531 Weakness: Secondary | ICD-10-CM | POA: Diagnosis not present

## 2023-07-02 DIAGNOSIS — H814 Vertigo of central origin: Secondary | ICD-10-CM | POA: Diagnosis not present

## 2023-07-05 ENCOUNTER — Encounter: Payer: Self-pay | Admitting: Family Medicine

## 2023-07-05 NOTE — Progress Notes (Signed)
Cardiology Office Note  Date:  07/06/2023   ID:  CHIPPER [name], DOB Sep 22, 1940, MRN [id number]  PCP:  Erasmo Downer, MD   Chief Complaint  Patient presents with   6 month follow up     Patient c/o right foot and ankle swelling & twinges in chest with exertion. Medications reviewed by the patient verbally.     HPI:  Brian [name] is a 82 y.o. 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 Age 2 quit early 2024 left subclavian stenosis, Ejection fraction 45 to 50% November 2023 Ejection fraction 40 to 45% October 2024 4 mm cavernous right ICA aneurysm.  2/24: PE Who presents for routine follow-up of his essential hypertension, coronary disease, PAD, leg swelling  Recent imaging and testing reviewed  Echocardiogram May 14, 2023 EF 40 to 45%, global hypokinesis, normal RV size and function  EF slightly worse compared to prior echocardiogram November 2023  Event monitor August 2024, normal sinus rhythm no significant arrhythmia  Carotid ultrasound moderate bilateral plaque  Stress test June 2022 fixed anterior wall defect EF 45%  Reports doing relatively well Worsening swelling right ankle, more recently, swelling started couple of years ago Venous duplex negative on right May have got worse with amlodipine from 5 up to 10 Feels his blood pressure is well-controlled, hesitant to change the amlodipine as he is doing so well Has not had to take hydralazine for high blood pressure  History of PE February 2024, still on Eliquis at therapeutic dose  Lab work reviewed Total cholesterol 119 LDL 51  EKG personally reviewed by myself on todays visit EKG Interpretation Date/Time:  Tuesday July 06 2023 11:18:59 EST Ventricular Rate:  59 PR Interval:  172 QRS Duration:  94 QT Interval:  418 QTC Calculation: 413 R  Axis:   -52  Text Interpretation: Sinus bradycardia Left axis deviation Anteroseptal infarct (cited on or before 28-May-2001) When compared with ECG of 26-Aug-2022 14:59, Sinus rhythm has replaced Junctional rhythm Nonspecific T wave abnormality no longer evident in Inferior leads T wave inversion no longer evident in Lateral leads Confirmed by Julien Nordmann [phone]) on 07/06/2023 11:27:05 AM    Prior records reviewed  Hospitalized in February 2024 with sepsis following ERCP and PE,  issues with low BP  lisinopril was held.  started on Diamox in March for vision loss by his optometrist and had low BP readings  Diamox was then stopped 3/27. Pt sent in elevated BP readings at the end of March up to 170s/100s. His lisinopril was increased to 2.5mg  BID. His optometrist then resumed Diamox 10/20/22 so his lisinopril was decreased back to once daily dosing. Diamox then stopped 4/10 after eye surgery. Lisinopril increased to 2.5mg  BID on 4/17 due to elevated BP. Labile blood pressure   quit smoking in February when he was hospitalized. Symptoms of dizziness that is unrelated to his BP, saw neurology  no clear cause identified.  Has not had any falls.  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 No plans to resume Diamox. 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 His daughter is an Charity fundraiser at [employer]. Last low BP reading was a week ago - 92/58. High on Saturday was 168/97.   PMH:   has a past medical history of AAA (abdominal aortic  aneurysm) (HCC), Adenomatous polyp, Anxiety, Carpal tunnel syndrome on left, Cervical spine disease, Colitis, Coronary artery disease, Deviated septum, Hyperlipidemia, Hypertension, Ischemic cardiomyopathy (05/19/2022), Low back pain, Myocardial infarction (HCC) (1998), Personal history of pulmonary embolism, Pulmonary embolism (HCC) (08/26/2022), Sleep apnea, and Wears dentures.  PSH:    Past Surgical History:  Procedure Laterality  Date   APPENDECTOMY     AQUEOUS SHUNT Right 10/26/2022   Procedure: AHMED TUBE SHUNT WITH TUTOPLAST RIGHT;  Surgeon: Nevada Crane, MD;  Location: [facility];  Service: Ophthalmology;  Laterality: Right;   bunions     CATARACT EXTRACTION     COLONOSCOPY WITH PROPOFOL N/A 02/19/2015   Procedure: COLONOSCOPY WITH PROPOFOL;  Surgeon: Midge Minium, MD;  Location: [facility];  Service: Endoscopy;  Laterality: N/A;   COLONOSCOPY WITH PROPOFOL N/A 06/05/2019   Procedure: COLONOSCOPY WITH BIOPSY;  Surgeon: Midge Minium, MD;  Location: [facility];  Service: Endoscopy;  Laterality: N/A;  sleep apnea   ERCP N/A 08/25/2022   Procedure: ENDOSCOPIC RETROGRADE CHOLANGIOPANCREATOGRAPHY (ERCP);  Surgeon: Midge Minium, MD;  Location: [facility] ENDOSCOPY;  Service: Endoscopy;  Laterality: N/A;   HEMORRHOID BANDING     HERNIA REPAIR     x3   OLECRANON BURSECTOMY Right    POLYPECTOMY N/A 06/05/2019   Procedure: POLYPECTOMY;  Surgeon: Midge Minium, MD;  Location: [facility];  Service: Endoscopy;  Laterality: N/A;    Current Outpatient Medications  Medication Sig Dispense Refill   amLODipine (NORVASC) 10 MG tablet Take 1 tablet (10 mg total) by mouth daily. 90 tablet 0   apixaban (ELIQUIS) 5 MG TABS tablet Take 1 tablet (5 mg total) by mouth 2 (two) times daily. 180 tablet 0   aspirin 81 MG tablet Take 81 mg by mouth at bedtime.     atorvastatin (LIPITOR) 40 MG tablet Take 1 tablet (40 mg total) by mouth at bedtime. 100 tablet 1   Cholecalciferol (VITAMIN D3) 50 MCG (2000 UT) CAPS Take by mouth daily.     Coenzyme Q10 (COQ10) 100 MG CAPS Take 1 capsule by mouth daily.       hydrALAZINE (APRESOLINE) 25 MG tablet Take 1 tablet (25 mg total) by mouth 3 (three) times daily. Take as needed for blood pressure greater then 140. 270 tablet 3   ketoconazole (NIZORAL) 2 % cream Apply 1 Application topically 2 (two) times daily for 21 days. 30 g 0   lipase/protease/amylase (CREON) 36000 UNITS  CPEP capsule Take 1 capsule (36,000 Units total) by mouth 3 (three) times daily with meals. May also take 1 capsule (36,000 Units total) as needed (with snacks up to 4 snacks daily). 300 capsule 11   meclizine (ANTIVERT) 25 MG tablet Take 1 tablet (25 mg total) by mouth 2 (two) times daily as needed for dizziness. 30 tablet 0   metroNIDAZOLE (FLAGYL) 500 MG tablet Take 1 tablet (500 mg total) by mouth 2 (two) times daily. 20 tablet 0   Multiple Vitamin (MULTIVITAMIN PO) Take 1 tablet by mouth daily.     nitroGLYCERIN (NITROSTAT) 0.4 MG SL tablet Place 1 tablet (0.4 mg total) under the tongue every 5 (five) minutes as needed for chest pain. 25 tablet 3   Oxycodone HCl 10 MG TABS Take 1-2 tablets (10-20 mg total) by mouth every 6 (six) hours as needed (moderate to severe pain). 120 tablet 0   saccharomyces boulardii (FLORASTOR) 250 MG capsule Take 250 mg by mouth 2 (two) times daily.     No current facility-administered medications  for this visit.   Facility-Administered Medications Ordered in Other Visits  Medication Dose Route Frequency Provider Last Rate Last Admin   albuterol (PROVENTIL) (2.5 MG/3ML) 0.083% nebulizer solution 2.5 mg  2.5 mg Nebulization Once Stoneking, Hal, MD        Allergies:   Patient has no known allergies.   Social History:  The patient  reports that he has been smoking cigarettes. He started smoking about 60 years ago. He has a 30 pack-year smoking history. He has never used smokeless tobacco. He reports that he does not currently use alcohol. He reports that he does not use drugs.   Family History:   family history includes Alzheimer's disease in his maternal grandmother; Colon cancer in his maternal grandfather; Coronary artery disease in his brother; Heart failure in his mother; Hypertension in his father; Kidney disease in his father.    Review of Systems: Review of Systems  Constitutional: Negative.   HENT: Negative.    Respiratory: Negative.     Cardiovascular: Negative.   Gastrointestinal: Negative.   Musculoskeletal: Negative.   Neurological: Negative.   Psychiatric/Behavioral: Negative.    All other systems reviewed and are negative.    PHYSICAL EXAM: VS:  BP 115/73 (BP Location: Left Arm, Patient Position: Sitting, Cuff Size: Normal)   Pulse (!) 59   Ht 5\' 9"  (1.753 m)   Wt 166 lb (75.3 kg)   SpO2 94%   BMI 24.51 kg/m  , BMI Body mass index is 24.51 kg/m. Constitutional:  oriented to person, place, and time. No distress.  HENT:  Head: Grossly normal Eyes:  no discharge. No scleral icterus.  Neck: No JVD, no carotid bruits  Cardiovascular: Regular rate and rhythm, no murmurs appreciated 1+ pitting edema right ankle into the foot Pulmonary/Chest: Clear to auscultation bilaterally, no wheezes or rails Abdominal: Soft.  no distension.  no tenderness.  Musculoskeletal: Normal range of motion Neurological:  normal muscle tone. Coordination normal. No atrophy Skin: Skin warm and dry Psychiatric: normal affect, pleasant   Recent Labs: 08/26/2022: Magnesium 1.9 02/01/2023: Hemoglobin 14.9; Platelets 253; TSH 3.160 06/08/2023: ALT 20; BUN 16; Creatinine, Ser 0.97; Potassium 4.5; Sodium 134    Lipid Panel Lab Results  Component Value Date   CHOL 119 06/08/2023   HDL 42 06/08/2023   LDLCALC 51 06/08/2023   TRIG 129 06/08/2023      Wt Readings from Last 3 Encounters:  07/06/23 166 lb (75.3 kg)  06/16/23 166 lb (75.3 kg)  06/07/23 165 lb (74.8 kg)     ASSESSMENT AND PLAN:  Problem List Items Addressed This Visit       Cardiology Problems   Ascending aortic aneurysm (HCC)   Relevant Orders   EKG 12-Lead (Completed)   Coronary artery disease involving native coronary artery of native heart with angina pectoris (HCC) - Primary   Relevant Orders   EKG 12-Lead (Completed)   Stenosis of left subclavian artery (HCC)     Other   Shortness of breath   Relevant Orders   EKG 12-Lead (Completed)   Sleep apnea    Other Visit Diagnoses       Chronic combined systolic and diastolic heart failure (HCC)       Relevant Orders   EKG 12-Lead (Completed)     Essential hypertension       Relevant Orders   EKG 12-Lead (Completed)     Hyperlipidemia LDL goal <70         Tobacco abuse  Essential hypertension Blood pressure is well controlled on today's visit. No changes made to the medications. If leg swelling persist, we suggested he could decrease amlodipine down to 5 mg daily  Right ankle swelling Venous duplex negative Already doing leg elevation and TED hose with ankle massage Recommend he try Lasix 20 mg 2 days a week If no improvement of symptoms, may need to decrease amlodipine down to 5 mg daily Unable to exclude venous reflux issue  Coronary artery disease with stable angina Currently with no symptoms of angina. No further workup at this time. Continue current medication regimen.  Hyperlipidemia Cholesterol is at goal on the current lipid regimen. No changes to the medications were made.  PAD Nonobstructive carotid disease left subclavian stenosis, 4 mm cavernous right ICA aneurysm.     Signed, Dossie Arbour, M.D., Ph.D. [facility] [place], [place] [phone]

## 2023-07-06 ENCOUNTER — Ambulatory Visit: Payer: Medicare Other | Attending: Cardiovascular Disease | Admitting: Cardiovascular Disease

## 2023-07-06 ENCOUNTER — Encounter: Payer: Self-pay | Admitting: Cardiovascular Disease

## 2023-07-06 VITALS — BP 115/73 | HR 59 | Ht 69.0 in | Wt 166.0 lb

## 2023-07-06 DIAGNOSIS — I5042 Chronic combined systolic (congestive) and diastolic (congestive) heart failure: Secondary | ICD-10-CM

## 2023-07-06 DIAGNOSIS — Z720 Tobacco use: Secondary | ICD-10-CM

## 2023-07-06 DIAGNOSIS — G4730 Sleep apnea, unspecified: Secondary | ICD-10-CM

## 2023-07-06 DIAGNOSIS — I25119 Atherosclerotic heart disease of native coronary artery with unspecified angina pectoris: Secondary | ICD-10-CM | POA: Diagnosis not present

## 2023-07-06 DIAGNOSIS — R0602 Shortness of breath: Secondary | ICD-10-CM | POA: Diagnosis not present

## 2023-07-06 DIAGNOSIS — I10 Essential (primary) hypertension: Secondary | ICD-10-CM

## 2023-07-06 DIAGNOSIS — I771 Stricture of artery: Secondary | ICD-10-CM

## 2023-07-06 DIAGNOSIS — I7121 Aneurysm of the ascending aorta, without rupture: Secondary | ICD-10-CM

## 2023-07-06 DIAGNOSIS — E785 Hyperlipidemia, unspecified: Secondary | ICD-10-CM | POA: Diagnosis not present

## 2023-07-06 NOTE — Patient Instructions (Addendum)
Medication Instructions:  Lasix 20 mg daily as needed  If leg swelling doe not improve, call the office We may need to cut amlodipine in 1/2 daily  Stop eliquis  If you need a refill on your cardiac medications before your next appointment, please call your pharmacy.   Lab work: No new labs needed  Testing/Procedures: No new testing needed  Follow-Up: At [facility], you and your health needs are our priority.  As part of our continuing mission to provide you with exceptional heart care, we have created designated Provider Care Teams.  These Care Teams include your primary Cardiologist (physician) and Advanced Practice Providers (APPs -  Physician Assistants and Nurse Practitioners) who all work together to provide you with the care you need, when you need it.  You will need a follow up appointment in 6 months  Providers on your designated Care Team:   Nicolasa Ducking, NP Eula Listen, PA-C Cadence Fransico Michael, [place]  COVID-19 Vaccine Information can be found at: [email or web address] For questions related to vaccine distribution or appointments, please email [email or web address] or call [phone].

## 2023-07-07 DIAGNOSIS — H4051X3 Glaucoma secondary to other eye disorders, right eye, severe stage: Secondary | ICD-10-CM | POA: Diagnosis not present

## 2023-07-07 DIAGNOSIS — H3403 Transient retinal artery occlusion, bilateral: Secondary | ICD-10-CM | POA: Diagnosis not present

## 2023-07-07 DIAGNOSIS — H26492 Other secondary cataract, left eye: Secondary | ICD-10-CM | POA: Diagnosis not present

## 2023-07-07 DIAGNOSIS — Z961 Presence of intraocular lens: Secondary | ICD-10-CM | POA: Diagnosis not present

## 2023-07-07 DIAGNOSIS — H34811 Central retinal vein occlusion, right eye, with macular edema: Secondary | ICD-10-CM | POA: Diagnosis not present

## 2023-07-08 ENCOUNTER — Ambulatory Visit: Payer: Medicare Other | Admitting: Family Medicine

## 2023-07-12 DIAGNOSIS — R531 Weakness: Secondary | ICD-10-CM | POA: Diagnosis not present

## 2023-07-12 DIAGNOSIS — R2681 Unsteadiness on feet: Secondary | ICD-10-CM | POA: Diagnosis not present

## 2023-07-12 DIAGNOSIS — H814 Vertigo of central origin: Secondary | ICD-10-CM | POA: Diagnosis not present

## 2023-07-16 ENCOUNTER — Ambulatory Visit (INDEPENDENT_AMBULATORY_CARE_PROVIDER_SITE_OTHER): Payer: Medicare Other | Admitting: Family Medicine

## 2023-07-16 VITALS — BP 113/73 | HR 60 | Resp 18 | Ht 69.0 in | Wt 164.0 lb

## 2023-07-16 DIAGNOSIS — M489 Spondylopathy, unspecified: Secondary | ICD-10-CM

## 2023-07-16 DIAGNOSIS — L989 Disorder of the skin and subcutaneous tissue, unspecified: Secondary | ICD-10-CM

## 2023-07-16 DIAGNOSIS — G894 Chronic pain syndrome: Secondary | ICD-10-CM

## 2023-07-16 MED ORDER — OXYCODONE HCL 10 MG PO TABS: 10.0000 mg | ORAL_TABLET | Freq: Four times a day (QID) | ORAL | 0 refills | Status: DC | PRN

## 2023-07-17 ENCOUNTER — Encounter: Payer: Self-pay | Admitting: Cardiovascular Disease

## 2023-07-18 ENCOUNTER — Other Ambulatory Visit: Payer: Self-pay | Admitting: *Deleted

## 2023-07-18 MED ORDER — FUROSEMIDE 20 MG PO TABS: ORAL_TABLET | ORAL | 3 refills | Status: DC

## 2023-07-20 DIAGNOSIS — H814 Vertigo of central origin: Secondary | ICD-10-CM | POA: Diagnosis not present

## 2023-07-20 DIAGNOSIS — R2681 Unsteadiness on feet: Secondary | ICD-10-CM | POA: Diagnosis not present

## 2023-07-20 DIAGNOSIS — R531 Weakness: Secondary | ICD-10-CM | POA: Diagnosis not present

## 2023-07-22 DIAGNOSIS — R531 Weakness: Secondary | ICD-10-CM | POA: Diagnosis not present

## 2023-07-22 DIAGNOSIS — R2681 Unsteadiness on feet: Secondary | ICD-10-CM | POA: Diagnosis not present

## 2023-07-22 DIAGNOSIS — H814 Vertigo of central origin: Secondary | ICD-10-CM | POA: Diagnosis not present

## 2023-07-27 DIAGNOSIS — R531 Weakness: Secondary | ICD-10-CM | POA: Diagnosis not present

## 2023-07-27 DIAGNOSIS — R2681 Unsteadiness on feet: Secondary | ICD-10-CM | POA: Diagnosis not present

## 2023-07-27 DIAGNOSIS — H814 Vertigo of central origin: Secondary | ICD-10-CM | POA: Diagnosis not present

## 2023-07-27 DIAGNOSIS — Q828 Other specified congenital malformations of skin: Secondary | ICD-10-CM | POA: Diagnosis not present

## 2023-07-29 DIAGNOSIS — H814 Vertigo of central origin: Secondary | ICD-10-CM | POA: Diagnosis not present

## 2023-07-29 DIAGNOSIS — R531 Weakness: Secondary | ICD-10-CM | POA: Diagnosis not present

## 2023-07-29 DIAGNOSIS — R2681 Unsteadiness on feet: Secondary | ICD-10-CM | POA: Diagnosis not present

## 2023-08-02 DIAGNOSIS — R2681 Unsteadiness on feet: Secondary | ICD-10-CM | POA: Diagnosis not present

## 2023-08-02 DIAGNOSIS — H814 Vertigo of central origin: Secondary | ICD-10-CM | POA: Diagnosis not present

## 2023-08-02 DIAGNOSIS — R531 Weakness: Secondary | ICD-10-CM | POA: Diagnosis not present

## 2023-08-03 ENCOUNTER — Encounter: Payer: Self-pay | Admitting: Internal Medicine

## 2023-08-03 ENCOUNTER — Ambulatory Visit: Payer: Medicare Other | Admitting: Internal Medicine

## 2023-08-03 VITALS — BP 116/76 | HR 65 | Temp 97.6°F | Ht 69.0 in | Wt 164.6 lb

## 2023-08-03 DIAGNOSIS — G4733 Obstructive sleep apnea (adult) (pediatric): Secondary | ICD-10-CM | POA: Diagnosis not present

## 2023-08-03 DIAGNOSIS — F17210 Nicotine dependence, cigarettes, uncomplicated: Secondary | ICD-10-CM | POA: Diagnosis not present

## 2023-08-03 DIAGNOSIS — J439 Emphysema, unspecified: Secondary | ICD-10-CM

## 2023-08-03 DIAGNOSIS — I2699 Other pulmonary embolism without acute cor pulmonale: Secondary | ICD-10-CM | POA: Diagnosis not present

## 2023-08-03 NOTE — Progress Notes (Signed)
 Name: Brian [name] MRN: [id number] DOB: [birth year]    SYNOPSIS Patient was admitted to the ICU  for E. coli sepsis with metabolic encephalopathy with acute liver failure patient underwent ERCP for gallbladder disease found to have sludge There is residual imbalance Patient does have some shortness of breath and dyspnea on exertion Patient is a longtime smoker 1 pack a day for the last 70 years Patient quit tobacco abuse last week Patient had a history of coronary artery disease with 2 stents placed Previously had been seeing Dr. Claudene cardiology in [place] Patient also on oral anticoagulation for diagnosis of PE    CHIEF COMPLAINT:  Follow up assessment of OSA Follow up assessment of emphysema   HISTORY OF PRESENT ILLNESS: Patient is seen today for problems and issues with OSA Compliance report reviewed in detail with patient Excellent compliance report at 100% CPAP of 7 showing an AHI of 9.9 Findings discussed with patient Lets plan to increase his CPAP to 9 and see if we can reduce the AHI to less than 5 Patient is feeling well overall Patient uses and benefits from therapy Patient now has a mask that fits well  History of CAD No exacerbation at this time No evidence of heart failure at this time No evidence or signs of infection at this time No respiratory distress No fevers, chills, nausea, vomiting, diarrhea No evidence of lower extremity edema No evidence hemoptysis  Emphysema on CT scan No significant  with respiratory issues at this time Patient does not use any inhalers Patient stays active and much as he can  Patient with a history of pulmonary embolism Weaned off of anticoagulation   PAST MEDICAL HISTORY :   has a past medical history of AAA (abdominal aortic aneurysm) (HCC), Adenomatous polyp, Anxiety, Carpal tunnel syndrome on left, Cervical spine disease, Colitis, Coronary artery disease, Deviated septum, Hyperlipidemia, Hypertension, Ischemic  cardiomyopathy (05/19/2022), Low back pain, Myocardial infarction (HCC) (1998), Personal history of pulmonary embolism, Pulmonary embolism (HCC) (08/26/2022), Sleep apnea, and Wears dentures.  has a past surgical history that includes Appendectomy; Hernia repair; Hemorrhoid banding; bunions; Cataract extraction; Colonoscopy with propofol  (N/A, 02/19/2015); Colonoscopy with propofol  (N/A, 06/05/2019); polypectomy (N/A, 06/05/2019); ERCP (N/A, 08/25/2022); Olecranon bursectomy (Right); and Aqueous shunt (Right, 10/26/2022). Prior to Admission medications   Medication Sig Start Date End Date Taking? Authorizing Provider  apixaban  (ELIQUIS ) 5 MG TABS tablet Take 2 tablets (10 mg total) by mouth 2 (two) times daily for 7 days, THEN 1 tablet (5 mg total) 2 (two) times daily. 08/29/22 11/27/22  Jhonny Calvin NOVAK, MD  aspirin  81 MG tablet Take 81 mg by mouth at bedtime.    [provider]  atorvastatin  (LIPITOR) 40 MG tablet Take 40 mg by mouth at bedtime.    [provider]  benzonatate  (TESSALON ) 200 MG capsule Take 1 capsule (200 mg total) by mouth 3 (three) times daily as needed for cough. 08/29/22   Jhonny Calvin NOVAK, MD  ciprofloxacin  (CIPRO ) 500 MG tablet Take 1 tablet (500 mg total) by mouth 2 (two) times daily for 11 days. 08/29/22 09/09/22  Jhonny Calvin NOVAK, MD  Coenzyme Q10 (COQ10) 100 MG CAPS Take 1 capsule by mouth daily.      [provider]  latanoprost  (XALATAN ) 0.005 % ophthalmic solution Place 1 drop into the right eye at bedtime. 03/04/22   [provider]  lisinopril  (ZESTRIL ) 20 MG tablet Take 1 tablet (20 mg total) by mouth daily. Patient taking differently: Take 20 mg  by mouth at bedtime. 07/22/22   Claudene Victory ORN, MD  meclizine  (ANTIVERT ) 25 MG tablet Take 1 tablet (25 mg total) by mouth 2 (two) times daily as needed for dizziness. 08/29/22   Jhonny Calvin NOVAK, MD  metoprolol  succinate (TOPROL -XL) 25 MG 24 hr tablet TAKE 1 TABLET(25 MG) BY MOUTH  DAILY Patient taking differently: Take 25 mg by mouth at bedtime. 08/18/22   Lelon Hamilton T, PA-C  Multiple Vitamin (MULTIVITAMIN PO) Take 1 tablet by mouth daily.    [provider]  nitroGLYCERIN  (NITROSTAT ) 0.4 MG SL tablet Place 0.4 mg under the tongue every 5 (five) minutes as needed for chest pain.    [provider]  Oxycodone  HCl 10 MG TABS Take 10-20 mg by mouth every 6 (six) hours as needed (moderate to severe pain).    [provider]   No Known Allergies  FAMILY HISTORY:  family history includes Alzheimer's disease in his maternal grandmother; Colon cancer in his maternal grandfather; Coronary artery disease in his brother; Heart failure in his mother; Hypertension in his father; Kidney disease in his father. SOCIAL HISTORY:  reports that he has been smoking cigarettes. He started smoking about 60 years ago. He has a 30 pack-year smoking history. He has never used smokeless tobacco. He reports that he does not currently use alcohol. He reports that he does not use drugs.      Ht 5' 9 (1.753 m)   BMI 24.22 kg/m    BP 116/76 (BP Location: Left Arm, Patient Position: Sitting, Cuff Size: Normal)   Pulse 65   Temp 97.6 F (36.4 C) (Temporal)   Ht 5' 9 (1.753 m)   Wt 164 lb 9.6 oz (74.7 kg)   SpO2 96%   BMI 24.31 kg/m     Review of Systems: Gen:  Denies  fever, sweats, chills weight loss  HEENT: Denies blurred vision, double vision, ear pain, eye pain, hearing loss, nose bleeds, sore throat Cardiac:  No dizziness, chest pain or heaviness, chest tightness,edema, No JVD Resp:   No cough, -sputum production, -shortness of breath,-wheezing, -hemoptysis,  Other:  All other systems negative   Physical Examination:   General Appearance: No distress  EYES PERRLA, EOM intact.   NECK Supple, No JVD Pulmonary: normal breath sounds, No wheezing.  CardiovascularNormal S1,S2.  No m/r/g.   Abdomen: Benign, Soft, non-tender. Neurology UE/LE 5/5  strength, no focal deficits Ext pulses intact, cap refill intact ALL OTHER ROS ARE NEGATIVE     ASSESSMENT AND PLAN SYNOPSIS  83 year old pleasant white male seen today for follow-up assessment for multiple medical issues including the diagnosis of obstructive sleep apnea and diagnosed 10 years ago with a history of coronary artery disease myocardial infarction with cardiac stents with a previous admission to the ICU for E. coli sepsis and metabolic encephalopathy along with a diagnosis of pulmonary embolism with longstanding history of smoking and tobacco abuse  Assessment of OSA Compliance report reviewed in detail with patient Increase CPAP to 9 cm of water  pressure Excellent compliance report Mask fits well    Assessment of emphysema No maintenance inhalers at this time No exacerbation at this time No evidence of heart failure at this time No evidence or signs of infection at this time No respiratory distress No fevers, chills, nausea, vomiting, diarrhea No evidence of lower extremity edema No evidence hemoptysis    Assessment pulmonary embolism Patient no longer on anticoagulation therapy  History of coronary artery disease Follow-up with cardiology  CT  of the head shows white matter disease Patient with imbalance issues Follow-up neurology   Deconditioned state -Recommend increased daily activity and exercise   MEDICATION ADJUSTMENTS/LABS AND TESTS ORDERED: Change CPAP to 9 cm water  pressure Avoid Allergens and Irritants Avoid secondhand smoke Avoid SICK contacts Recommend  Masking  when appropriate Recommend Keep up-to-date with vaccinations   CURRENT MEDICATIONS REVIEWED AT LENGTH WITH PATIENT TODAY   Patient  satisfied with Plan of action and management. All questions answered  Follow up 4 weeks   Total Time Spent  45 mins   Nickolas Alm Cellar, M.D.  [facility]  Medical Director [facility] Medical  Director [facility] Cardio-Pulmonary Department

## 2023-08-03 NOTE — Patient Instructions (Signed)
 Lets plan to change CPAP to 9 cm h20  Excellent Job with CPAP A+!!!!  Avoid Allergens and Irritants Avoid secondhand smoke Avoid SICK contacts Recommend  Masking  when appropriate Recommend Keep up-to-date with vaccinations

## 2023-08-04 DIAGNOSIS — R2681 Unsteadiness on feet: Secondary | ICD-10-CM | POA: Diagnosis not present

## 2023-08-04 DIAGNOSIS — H814 Vertigo of central origin: Secondary | ICD-10-CM | POA: Diagnosis not present

## 2023-08-04 DIAGNOSIS — R531 Weakness: Secondary | ICD-10-CM | POA: Diagnosis not present

## 2023-08-06 ENCOUNTER — Encounter: Payer: Self-pay | Admitting: Internal Medicine

## 2023-08-10 DIAGNOSIS — R2681 Unsteadiness on feet: Secondary | ICD-10-CM | POA: Diagnosis not present

## 2023-08-10 DIAGNOSIS — R531 Weakness: Secondary | ICD-10-CM | POA: Diagnosis not present

## 2023-08-10 DIAGNOSIS — H814 Vertigo of central origin: Secondary | ICD-10-CM | POA: Diagnosis not present

## 2023-08-12 DIAGNOSIS — R531 Weakness: Secondary | ICD-10-CM | POA: Diagnosis not present

## 2023-08-12 DIAGNOSIS — R2681 Unsteadiness on feet: Secondary | ICD-10-CM | POA: Diagnosis not present

## 2023-08-12 DIAGNOSIS — H814 Vertigo of central origin: Secondary | ICD-10-CM | POA: Diagnosis not present

## 2023-08-17 ENCOUNTER — Encounter: Payer: Self-pay | Admitting: Family Medicine

## 2023-08-17 DIAGNOSIS — H814 Vertigo of central origin: Secondary | ICD-10-CM | POA: Diagnosis not present

## 2023-08-17 DIAGNOSIS — R531 Weakness: Secondary | ICD-10-CM | POA: Diagnosis not present

## 2023-08-17 DIAGNOSIS — R2681 Unsteadiness on feet: Secondary | ICD-10-CM | POA: Diagnosis not present

## 2023-08-18 ENCOUNTER — Other Ambulatory Visit: Payer: Self-pay | Admitting: Family Medicine

## 2023-08-18 DIAGNOSIS — R531 Weakness: Secondary | ICD-10-CM | POA: Diagnosis not present

## 2023-08-18 DIAGNOSIS — R2681 Unsteadiness on feet: Secondary | ICD-10-CM | POA: Diagnosis not present

## 2023-08-18 DIAGNOSIS — H814 Vertigo of central origin: Secondary | ICD-10-CM | POA: Diagnosis not present

## 2023-08-18 DIAGNOSIS — G894 Chronic pain syndrome: Secondary | ICD-10-CM

## 2023-08-18 NOTE — Telephone Encounter (Signed)
Medication Refill -  Most Recent Primary Care Visit:  Provider: Malva Limes  Department: [facility]  Visit Type: OFFICE VISIT  Date: 07/16/2023  Medication: Oxycodone HCl 10 MG TABS [[id number]]   Has the patient contacted their pharmacy? Yes  (Agent: If yes, when and what did the pharmacy advise?) Contact office   Is this the correct pharmacy for this prescription? Yes  This is the patient's preferred pharmacy:  [id number] [place] - [address] AT [facility] OF [address] [place] [address] Phone: [phone] Fax: [phone]   **Pt is requesting it be filled as soon as possible because he is out**  Has the prescription been filled recently? Yes  Is the patient out of the medication? Yes  Has the patient been seen for an appointment in the last year OR does the patient have an upcoming appointment? Yes  Can we respond through MyChart? Yes  Agent: Please be advised that Rx refills may take up to 3 business days. We ask that you follow-up with your pharmacy.

## 2023-08-18 NOTE — Telephone Encounter (Signed)
Refill request created and routed to provider

## 2023-08-18 NOTE — Telephone Encounter (Signed)
Requested medication (s) are due for refill today: yes  Requested medication (s) are on the active medication list: yes  Last refill:  07/15/24  Future visit scheduled: yes  Notes to clinic:  Unable to refill per protocol, cannot delegate.      Requested Prescriptions  Pending Prescriptions Disp Refills   Oxycodone HCl 10 MG TABS 120 tablet 0    Sig: Take 1-2 tablets (10-20 mg total) by mouth every 6 (six) hours as needed (moderate to severe pain).     Not Delegated - Analgesics:  Opioid Agonists Failed - 08/18/2023 12:23 PM      Failed - This refill cannot be delegated      Failed - Urine Drug Screen completed in last 360 days      Passed - Valid encounter within last 3 months    Recent Outpatient Visits           1 month ago Skin lesion   [facility] Malva Limes, MD   2 months ago Skin lesion   [facility] Malva Limes, MD   2 months ago Encounter for annual physical exam   [place] [facility] [facility] [place], Marzella Schlein, MD   6 months ago Prediabetes   [place] [facility] [place], Marzella Schlein, MD   6 months ago Lower extremity edema   [place] [facility] [place], Marzella Schlein, MD       Future Appointments             In 2 weeks Stoioff, Verna Czech, MD [facility] [place]   In 2 weeks Bacigalupo, Marzella Schlein, MD [facility]   In 4 weeks Erin Fulling, MD [facility] at [place]   In 1 month Deirdre Evener, MD [facility]   In 1 month [name], MD [facility] at [facility]

## 2023-08-19 MED ORDER — OXYCODONE HCL 10 MG PO TABS: 10.0000 mg | ORAL_TABLET | Freq: Four times a day (QID) | ORAL | 0 refills | Status: DC | PRN

## 2023-08-23 DIAGNOSIS — R531 Weakness: Secondary | ICD-10-CM | POA: Diagnosis not present

## 2023-08-23 DIAGNOSIS — R2681 Unsteadiness on feet: Secondary | ICD-10-CM | POA: Diagnosis not present

## 2023-08-23 DIAGNOSIS — H814 Vertigo of central origin: Secondary | ICD-10-CM | POA: Diagnosis not present

## 2023-08-25 DIAGNOSIS — R278 Other lack of coordination: Secondary | ICD-10-CM | POA: Diagnosis not present

## 2023-08-25 DIAGNOSIS — G629 Polyneuropathy, unspecified: Secondary | ICD-10-CM | POA: Diagnosis not present

## 2023-08-25 DIAGNOSIS — G4733 Obstructive sleep apnea (adult) (pediatric): Secondary | ICD-10-CM | POA: Diagnosis not present

## 2023-08-25 DIAGNOSIS — R42 Dizziness and giddiness: Secondary | ICD-10-CM | POA: Diagnosis not present

## 2023-08-25 DIAGNOSIS — R2689 Other abnormalities of gait and mobility: Secondary | ICD-10-CM | POA: Diagnosis not present

## 2023-08-26 DIAGNOSIS — H814 Vertigo of central origin: Secondary | ICD-10-CM | POA: Diagnosis not present

## 2023-08-26 DIAGNOSIS — R531 Weakness: Secondary | ICD-10-CM | POA: Diagnosis not present

## 2023-08-26 DIAGNOSIS — R2681 Unsteadiness on feet: Secondary | ICD-10-CM | POA: Diagnosis not present

## 2023-08-30 DIAGNOSIS — M25641 Stiffness of right hand, not elsewhere classified: Secondary | ICD-10-CM | POA: Diagnosis not present

## 2023-08-30 DIAGNOSIS — R531 Weakness: Secondary | ICD-10-CM | POA: Diagnosis not present

## 2023-08-30 DIAGNOSIS — R2681 Unsteadiness on feet: Secondary | ICD-10-CM | POA: Diagnosis not present

## 2023-08-30 DIAGNOSIS — H814 Vertigo of central origin: Secondary | ICD-10-CM | POA: Diagnosis not present

## 2023-08-31 ENCOUNTER — Ambulatory Visit: Payer: Medicare Other | Admitting: Urology

## 2023-08-31 DIAGNOSIS — H814 Vertigo of central origin: Secondary | ICD-10-CM | POA: Diagnosis not present

## 2023-08-31 DIAGNOSIS — R531 Weakness: Secondary | ICD-10-CM | POA: Diagnosis not present

## 2023-08-31 DIAGNOSIS — R2681 Unsteadiness on feet: Secondary | ICD-10-CM | POA: Diagnosis not present

## 2023-08-31 DIAGNOSIS — M25641 Stiffness of right hand, not elsewhere classified: Secondary | ICD-10-CM | POA: Diagnosis not present

## 2023-09-01 ENCOUNTER — Ambulatory Visit: Payer: Medicare Other | Admitting: Urology

## 2023-09-01 ENCOUNTER — Encounter: Payer: Self-pay | Admitting: Urology

## 2023-09-01 VITALS — BP 124/82 | HR 73 | Ht 70.0 in | Wt 163.0 lb

## 2023-09-01 DIAGNOSIS — K6289 Other specified diseases of anus and rectum: Secondary | ICD-10-CM

## 2023-09-01 DIAGNOSIS — N402 Nodular prostate without lower urinary tract symptoms: Secondary | ICD-10-CM

## 2023-09-01 DIAGNOSIS — Z125 Encounter for screening for malignant neoplasm of prostate: Secondary | ICD-10-CM

## 2023-09-01 NOTE — Progress Notes (Signed)
I, Brian [name], acting as a scribe for Riki Altes, MD., have documented all relevant documentation on the behalf of Riki Altes, MD, as directed by Riki Altes, MD while in the presence of Riki Altes, MD.  09/01/2023 3:37 PM   [name] [birth year] [id number]  Referring provider: Erasmo Downer, MD [address] [place],  [place] [address]  Chief Complaint  Patient presents with   Benign Prostatic Hypertrophy    HPI: Brian [name] is a 83 y.o. male referred for evaluation of prostate nodule.  Previously followed by Dr. Retta Diones for a extraprostatic prostate nodule in June 2017. His PSA was 0.7 and he has undergone annual exams. He last saw Dr. Retta Diones February 2024 and a BB-size nodularity superior to the right base was identified and felt to be extraprostatic. His condition is unknown. PSA was 0.7 at the time of diagnosis and has remained <1. PCP checked his PSA July 2024, and it was 0.9   PMH: Past Medical History:  Diagnosis Date   AAA (abdominal aortic aneurysm) (HCC)    Adenomatous polyp    Anxiety    Carpal tunnel syndrome on left    Cervical spine disease    Colitis    Coronary artery disease    S/p anterior MI tx with PCI in 1998  //  Myoview 6/22: EF 45, ant and septal scar; no ischemia; intermediate risk // Echocardiogram 7/22: EF 45-50, ant-sept and apical HK, Gr 1 DD, normal RVSF, mild MR   Deviated septum    Hyperlipidemia    Hypertension    Renal Artery [place] 12/2022: no RAS bilaterally   Ischemic cardiomyopathy 05/19/2022   Echocardiogram 06/02/22: EF 45-50, global HK, Gr 1 DD, NL RVSF, NL PASP (RVSP 26.4), trivial MR, RAP 3   Low back pain    Myocardial infarction (HCC) 1998   Personal history of pulmonary embolism    Pulmonary embolism (HCC) 08/26/2022   Sleep apnea    CPAP   Wears dentures    full upper    Surgical History: Past Surgical History:  Procedure Laterality Date   APPENDECTOMY      AQUEOUS SHUNT Right 10/26/2022   Procedure: AHMED TUBE SHUNT WITH TUTOPLAST RIGHT;  Surgeon: Nevada Crane, MD;  Location: [facility];  Service: Ophthalmology;  Laterality: Right;   bunions     CATARACT EXTRACTION     COLONOSCOPY WITH PROPOFOL N/A 02/19/2015   Procedure: COLONOSCOPY WITH PROPOFOL;  Surgeon: Midge Minium, MD;  Location: [facility];  Service: Endoscopy;  Laterality: N/A;   COLONOSCOPY WITH PROPOFOL N/A 06/05/2019   Procedure: COLONOSCOPY WITH BIOPSY;  Surgeon: Midge Minium, MD;  Location: [facility];  Service: Endoscopy;  Laterality: N/A;  sleep apnea   ERCP N/A 08/25/2022   Procedure: ENDOSCOPIC RETROGRADE CHOLANGIOPANCREATOGRAPHY (ERCP);  Surgeon: Midge Minium, MD;  Location: [facility];  Service: Endoscopy;  Laterality: N/A;   HEMORRHOID BANDING     HERNIA REPAIR     x3   OLECRANON BURSECTOMY Right    POLYPECTOMY N/A 06/05/2019   Procedure: POLYPECTOMY;  Surgeon: Midge Minium, MD;  Location: [facility];  Service: Endoscopy;  Laterality: N/A;    Home Medications:  Allergies as of 09/01/2023   No Known Allergies      Medication List        Accurate as of September 01, 2023  3:37 PM. If you have any questions, ask your nurse or doctor.  STOP taking these medications    lipase/protease/amylase [address]81 UNITS Cpep capsule Commonly known as: Creon Stopped by: Riki Altes       TAKE these medications    amLODipine 10 MG tablet Commonly known as: NORVASC Take 1 tablet (10 mg total) by mouth daily.   aspirin 81 MG tablet Take 81 mg by mouth at bedtime.   atorvastatin 40 MG tablet Commonly known as: LIPITOR Take 1 tablet (40 mg total) by mouth at bedtime.   CoQ10 100 MG Caps Take 1 capsule by mouth daily.   Florastor 250 MG capsule Generic drug: saccharomyces boulardii Take 250 mg by mouth 2 (two) times daily.   furosemide 20 MG tablet Commonly known as: LASIX Take 1 tablet (20 mg) by mouth once daily  as needed for swelling   hydrALAZINE 25 MG tablet Commonly known as: APRESOLINE Take 1 tablet (25 mg total) by mouth 3 (three) times daily. Take as needed for blood pressure greater then 140.   meclizine 25 MG tablet Commonly known as: ANTIVERT Take 1 tablet (25 mg total) by mouth 2 (two) times daily as needed for dizziness.   metroNIDAZOLE 500 MG tablet Commonly known as: FLAGYL Take 1 tablet (500 mg total) by mouth 2 (two) times daily.   MULTIVITAMIN PO Take 1 tablet by mouth daily.   nitroGLYCERIN 0.4 MG SL tablet Commonly known as: NITROSTAT Place 1 tablet (0.4 mg total) under the tongue every 5 (five) minutes as needed for chest pain.   Oxycodone HCl 10 MG Tabs Take 1-2 tablets (10-20 mg total) by mouth every 6 (six) hours as needed (moderate to severe pain).   Vitamin D3 50 MCG (2000 UT) capsule Take by mouth daily.        Allergies: No Known Allergies  Family History: Family History  Problem Relation Age of Onset   Heart failure Mother    Kidney disease Father    Hypertension Father    Coronary artery disease Brother    Alzheimer's disease Maternal Grandmother    Colon cancer Maternal Grandfather     Social History:  reports that he quit smoking about a year ago. His smoking use included cigarettes. He started smoking about 61 years ago. He has a 30 pack-year smoking history. He has never used smokeless tobacco. He reports that he does not currently use alcohol. He reports that he does not use drugs.   Physical Exam: BP 124/82   Pulse 73   Ht 5\' 10"  (1.778 m)   Wt 163 lb (73.9 kg)   BMI 23.39 kg/m   Constitutional:  Alert and oriented, No acute distress. HEENT: [place] AT Respiratory: Normal respiratory effort, no increased work of breathing. GU: Small 2mm nodularity superior to the right prostate, which appears unchanged compared with prior description.  Psychiatric: Normal mood and affect.   Assessment & Plan:    1. Extraprostatic nodule Probopelvic  flubilis Identified in 2017 and has been stable. He feels more comfortable getting annual checks and will schedule for a 1 year follow-up. If the area has not changed after 10 years (2027) would discontinue annual exams.   2. Prostate cancer screening Based on age and PSA stability, would recommend discontinuing PSA checks.  I have reviewed the above documentation for accuracy and completeness, and I agree with the above.   Riki Altes, MD  [facility] [address], [address] [place], [place] [address] [phone]

## 2023-09-02 DIAGNOSIS — Z961 Presence of intraocular lens: Secondary | ICD-10-CM | POA: Diagnosis not present

## 2023-09-02 DIAGNOSIS — H26492 Other secondary cataract, left eye: Secondary | ICD-10-CM | POA: Diagnosis not present

## 2023-09-02 DIAGNOSIS — H34811 Central retinal vein occlusion, right eye, with macular edema: Secondary | ICD-10-CM | POA: Diagnosis not present

## 2023-09-02 DIAGNOSIS — H4051X3 Glaucoma secondary to other eye disorders, right eye, severe stage: Secondary | ICD-10-CM | POA: Diagnosis not present

## 2023-09-06 DIAGNOSIS — M79674 Pain in right toe(s): Secondary | ICD-10-CM | POA: Diagnosis not present

## 2023-09-06 DIAGNOSIS — R600 Localized edema: Secondary | ICD-10-CM | POA: Diagnosis not present

## 2023-09-06 DIAGNOSIS — M21371 Foot drop, right foot: Secondary | ICD-10-CM | POA: Diagnosis not present

## 2023-09-06 DIAGNOSIS — M79675 Pain in left toe(s): Secondary | ICD-10-CM | POA: Diagnosis not present

## 2023-09-06 DIAGNOSIS — M19071 Primary osteoarthritis, right ankle and foot: Secondary | ICD-10-CM | POA: Diagnosis not present

## 2023-09-06 DIAGNOSIS — B351 Tinea unguium: Secondary | ICD-10-CM | POA: Diagnosis not present

## 2023-09-06 DIAGNOSIS — R7303 Prediabetes: Secondary | ICD-10-CM | POA: Diagnosis not present

## 2023-09-07 ENCOUNTER — Ambulatory Visit
Admission: RE | Admit: 2023-09-07 | Discharge: 2023-09-07 | Disposition: A | Discharge status: Home / Self Care | Payer: Medicare Other | Attending: Family Medicine | Admitting: Family Medicine

## 2023-09-07 ENCOUNTER — Encounter: Payer: Self-pay | Admitting: Family Medicine

## 2023-09-07 ENCOUNTER — Ambulatory Visit
Admission: RE | Admit: 2023-09-07 | Discharge: 2023-09-07 | Disposition: A | Discharge status: Home / Self Care | Payer: Medicare Other | Source: Ambulatory Visit | Attending: Family Medicine | Admitting: Family Medicine

## 2023-09-07 ENCOUNTER — Ambulatory Visit (INDEPENDENT_AMBULATORY_CARE_PROVIDER_SITE_OTHER): Payer: Medicare Other | Admitting: Family Medicine

## 2023-09-07 VITALS — BP 125/79 | HR 64 | Wt 165.1 lb

## 2023-09-07 DIAGNOSIS — R7303 Prediabetes: Secondary | ICD-10-CM

## 2023-09-07 DIAGNOSIS — E7800 Pure hypercholesterolemia, unspecified: Secondary | ICD-10-CM | POA: Diagnosis not present

## 2023-09-07 DIAGNOSIS — M545 Low back pain, unspecified: Secondary | ICD-10-CM | POA: Diagnosis not present

## 2023-09-07 DIAGNOSIS — G8929 Other chronic pain: Secondary | ICD-10-CM | POA: Insufficient documentation

## 2023-09-07 DIAGNOSIS — I10 Essential (primary) hypertension: Secondary | ICD-10-CM

## 2023-09-07 DIAGNOSIS — M5136 Other intervertebral disc degeneration, lumbar region with discogenic back pain only: Secondary | ICD-10-CM | POA: Diagnosis not present

## 2023-09-07 DIAGNOSIS — M419 Scoliosis, unspecified: Secondary | ICD-10-CM | POA: Diagnosis not present

## 2023-09-07 DIAGNOSIS — G894 Chronic pain syndrome: Secondary | ICD-10-CM

## 2023-09-07 DIAGNOSIS — I700 Atherosclerosis of aorta: Secondary | ICD-10-CM | POA: Diagnosis not present

## 2023-09-07 MED ORDER — BACLOFEN 10 MG PO TABS: 10.0000 mg | ORAL_TABLET | Freq: Three times a day (TID) | ORAL | 0 refills | Status: DC | PRN

## 2023-09-07 MED ORDER — PREDNISONE 10 MG PO TABS: ORAL_TABLET | ORAL | 0 refills | Status: DC

## 2023-09-07 MED ORDER — OXYCODONE HCL 10 MG PO TABS: 10.0000 mg | ORAL_TABLET | ORAL | 0 refills | Status: AC | PRN

## 2023-09-07 NOTE — Progress Notes (Signed)
Established patient visit   Patient: Brian [name]   DOB: 07-12-1941   83 y.o. Male  MRN: [id number] Visit Date: 09/07/2023  Today's healthcare provider: Shirlee Latch, MD   Chief Complaint  Patient presents with   Medical Management of Chronic Issues    3 month follow-up   Hypertension   Hyperlipidemia   Pre-Diabetes   Back Pain    Low back pain X Sunday that has been worsening. Pt not sure if due to incident. He little to no sleep and barely able to do anything around the house. Prescribed rx for pain is not assisting with pain.   Care Management    Colonoscopy - Pt believes he was advised he no longer needed by Dr.Wohl   Subjective    HPI HPI     Medical Management of Chronic Issues    Additional comments: 3 month follow-up        Back Pain    Additional comments: Low back pain X Sunday that has been worsening. Pt not sure if due to incident. He little to no sleep and barely able to do anything around the house. Prescribed rx for pain is not assisting with pain.        Care Management    Additional comments: Colonoscopy - Pt believes he was advised he no longer needed by Dr.Wohl      Last edited by Acey Lav, CMA on 09/07/2023  3:27 PM.       Discussed the use of AI scribe software for clinical note transcription with the patient, who gave verbal consent to proceed.  History of Present Illness   The patient, with a known history of degenerative disc disease and scoliosis, presents with a new onset of severe, sharp back pain that started on Sunday. The pain is described as different from the usual chronic back pain he experiences. The pain is located across both sides of the back, from the belt area to the upper, meatier part. The pain is severe enough to limit mobility, disrupt sleep, and increase the use of oxycodone for pain management. The patient reports that the pain is so severe that even slight movements can trigger sharp pain. The  patient also reports an increase in the use of oxycodone, from the usual three to four times a day to six times a day, raising concerns about running out of medication before the end of the month. The patient denies any changes in bowel or bladder habits, numbness or weakness in either leg, or any rash.       Medications: Outpatient Medications Prior to Visit  Medication Sig   amLODipine (NORVASC) 10 MG tablet Take 1 tablet (10 mg total) by mouth daily.   aspirin 81 MG tablet Take 81 mg by mouth at bedtime.   atorvastatin (LIPITOR) 40 MG tablet Take 1 tablet (40 mg total) by mouth at bedtime.   Cholecalciferol (VITAMIN D3) 50 MCG (2000 UT) CAPS Take by mouth daily.   Coenzyme Q10 (COQ10) 100 MG CAPS Take 1 capsule by mouth daily.     furosemide (LASIX) 20 MG tablet Take 1 tablet (20 mg) by mouth once daily as needed for swelling   meclizine (ANTIVERT) 25 MG tablet Take 1 tablet (25 mg total) by mouth 2 (two) times daily as needed for dizziness.   metroNIDAZOLE (FLAGYL) 500 MG tablet Take 1 tablet (500 mg total) by mouth 2 (two) times daily.   Multiple Vitamin (MULTIVITAMIN PO) Take 1 tablet  by mouth daily.   nitroGLYCERIN (NITROSTAT) 0.4 MG SL tablet Place 1 tablet (0.4 mg total) under the tongue every 5 (five) minutes as needed for chest pain.   Oxycodone HCl 10 MG TABS Take 1-2 tablets (10-20 mg total) by mouth every 6 (six) hours as needed (moderate to severe pain).   saccharomyces boulardii (FLORASTOR) 250 MG capsule Take 250 mg by mouth 2 (two) times daily.   hydrALAZINE (APRESOLINE) 25 MG tablet Take 1 tablet (25 mg total) by mouth 3 (three) times daily. Take as needed for blood pressure greater then 140.   Facility-Administered Medications Prior to Visit  Medication Dose Route Frequency Provider   albuterol (PROVENTIL) (2.5 MG/3ML) 0.083% nebulizer solution 2.5 mg  2.5 mg Nebulization Once [place], Hal, MD    Review of Systems      Objective    BP 125/79 (BP Location: Right  Arm, Patient Position: Sitting, Cuff Size: Normal)   Pulse 64   Wt 165 lb 1.6 oz (74.9 kg)   SpO2 95%   BMI 23.69 kg/m    Physical Exam Vitals reviewed.  Constitutional:      General: He is not in acute distress.    Appearance: Normal appearance. He is not diaphoretic.  HENT:     Head: Normocephalic and atraumatic.  Eyes:     General: No scleral icterus.    Conjunctiva/sclera: Conjunctivae normal.  Cardiovascular:     Rate and Rhythm: Normal rate and regular rhythm.     Heart sounds: Normal heart sounds. No murmur heard. Pulmonary:     Effort: Pulmonary effort is normal. No respiratory distress.     Breath sounds: Normal breath sounds. No wheezing or rhonchi.  Musculoskeletal:     Cervical back: Neck supple.     Right lower leg: No edema.     Left lower leg: No edema.  Lymphadenopathy:     Cervical: No cervical adenopathy.  Skin:    General: Skin is warm and dry.     Findings: No rash.  Neurological:     Mental Status: He is alert and oriented to person, place, and time. Mental status is at baseline.  Psychiatric:        Mood and Affect: Mood normal.        Behavior: Behavior normal.      Physical Exam   MUSCULOSKELETAL: Spine not tender to palpation. Lumbar spine with pronounced curvature. NEUROLOGICAL: Motor strength intact bilaterally. Sensation intact bilaterally.       No results found for any visits on 09/07/23.  Assessment & Plan     Problem List Items Addressed This Visit       Cardiovascular and Mediastinum   Hypertension - Primary     Other   Hyperlipidemia   Chronic pain syndrome   Relevant Medications   Oxycodone HCl 10 MG TABS   predniSONE (DELTASONE) 10 MG tablet   baclofen (LIORESAL) 10 MG tablet   Other Relevant Orders   Pain Mgt Scrn (14 Drugs), Ur   Prediabetes   Other Visit Diagnoses       Acute on chronic low back pain       Relevant Medications   Oxycodone HCl 10 MG TABS   predniSONE (DELTASONE) 10 MG tablet   baclofen  (LIORESAL) 10 MG tablet   Other Relevant Orders   DG Lumbar Spine Complete           Acute Exacerbation of Chronic Low Back Pain Severe exacerbation of chronic low back pain, rated  8-9/10, affecting both sides of the lower back. No recent trauma, numbness, weakness, or changes in bowel/bladder habits. Previous x-ray (2023) showed scoliosis with advanced disc disease at L2-L3 and L3-L4, with near complete loss of disc space and significant arthritis changes. Differential diagnosis includes muscle spasm and potential compression fracture. Physical exam reveals paraspinal muscle tenderness. Discussed prednisone for inflammation, baclofen for muscle spasms, and short-term oxycodone for acute pain. Potential referral to pain management if pain persists or worsens. - Order lumbar spine x-ray - Prescribe prednisone taper for 6 days - Prescribe baclofen 10 mg up to three times a day as needed for muscle spasms - Provide a short-term prescription of oxycodone 10-20 mg every 4 hours as needed for acute pain, 30 pills total (then will resume chronic dose) - Refer to chiropractor for dry needling and acupuncture - Discuss potential referral to pain management if pain persists or worsens  Hypertension Blood pressure well-controlled on amlodipine. Hydralazine used as needed but not required for months. - Continue amlodipine - Continue hydralazine as needed  Hyperlipidemia Lipid levels well-controlled on Lipitor. Recent labs (November) show good control. - Continue Lipitor  Prediabetes A1c decreased from 6.4 to 6.0. Reduced sugar intake. - Monitor A1c at next visit  General Health Maintenance Blood pressure and lipid levels well-controlled. Advanced directives and authorization for release updated on January 20th. - Update advanced directives and authorization for release in chart - Schedule follow-up in three months  Follow-up - Schedule follow-up appointment in three months - Review x-ray  results and communicate findings via MyChart - Consider earlier follow-up if back pain does not improve or if x-ray reveals significant findings.         Return in about 3 months (around 12/05/2023) for chronic disease f/u.       Shirlee Latch, MD  [facility] [phone] (phone) [phone] (fax)  [facility]

## 2023-09-08 LAB — PAIN MGT SCRN (14 DRUGS), UR
Amphetamine Scrn, Ur: NEGATIVE ng/mL
BARBITURATE SCREEN URINE: NEGATIVE ng/mL
BENZODIAZEPINE SCREEN, URINE: NEGATIVE ng/mL
Buprenorphine, Urine: NEGATIVE ng/mL
CANNABINOIDS UR QL SCN: NEGATIVE ng/mL
Cocaine (Metab) Scrn, Ur: NEGATIVE ng/mL
Creatinine(Crt), U: 86.5 mg/dL (ref 20.0–300.0)
Fentanyl, Urine: NEGATIVE pg/mL
Meperidine Screen, Urine: NEGATIVE ng/mL
Methadone Screen, Urine: NEGATIVE ng/mL
OXYCODONE+OXYMORPHONE UR QL SCN: POSITIVE ng/mL — AB
Opiate Scrn, Ur: POSITIVE ng/mL — AB
Ph of Urine: 5.9 (ref 4.5–8.9)
Phencyclidine Qn, Ur: NEGATIVE ng/mL
Propoxyphene Scrn, Ur: NEGATIVE ng/mL
Tramadol Screen, Urine: NEGATIVE ng/mL

## 2023-09-09 ENCOUNTER — Encounter: Payer: Self-pay | Admitting: Family Medicine

## 2023-09-13 ENCOUNTER — Ambulatory Visit: Payer: Self-pay | Admitting: Family Medicine

## 2023-09-13 DIAGNOSIS — R2681 Unsteadiness on feet: Secondary | ICD-10-CM | POA: Diagnosis not present

## 2023-09-13 DIAGNOSIS — H814 Vertigo of central origin: Secondary | ICD-10-CM | POA: Diagnosis not present

## 2023-09-13 DIAGNOSIS — R531 Weakness: Secondary | ICD-10-CM | POA: Diagnosis not present

## 2023-09-13 DIAGNOSIS — M25641 Stiffness of right hand, not elsewhere classified: Secondary | ICD-10-CM | POA: Diagnosis not present

## 2023-09-13 NOTE — Telephone Encounter (Signed)
  Chief Complaint: Back pain - getting worse Symptoms: Back pain Frequency: Ongoing Pertinent Negatives: Patient denies Neurological involvement Disposition: [] ED /[] Urgent Care (no appt availability in office) / [] Appointment(In office/virtual)/ []  [facility]/ [] Home Care/ [] Refused Recommended Disposition /[] [facility]/ []  Follow-up with PCP Additional Notes: Pt was seen by provider 09/07/2023 for back pain and had x-ray. Pt reports that pain has increased. Pain medication and muscle relaxer has not decreased pain. At [facility] provider discussed with an ortho or neuro consult. No appts in office for tomorrow. Pt would like to be worked in for tomorrow with Dr. Beryle Flock. Pt will come at any time. Please call pt back. Please advise.    Copied from CRM [id number]. Topic: Clinical - Red Word Triage >> Sep 13, 2023  4:22 PM Geroge Baseman wrote: Red Word that prompted transfer to Nurse Triage: Patient is in a lot of pain, lower back really bad, saw dr b last Tuesday for this reason, seems to be getting worse. Reason for Disposition  [1] SEVERE back pain (e.g., excruciating, unable to do any normal activities) AND [2] not improved 2 hours after pain medicine  Answer Assessment - Initial Assessment Questions 1. ONSET: "When did the pain begin?"      Ongoing 2. LOCATION: "Where does it hurt?" (upper, mid or lower back)     Lower back 3. SEVERITY: "How bad is the pain?"  (e.g., Scale 1-10; mild, moderate, or severe)   - MILD (1-3): Doesn't interfere with normal activities.    - MODERATE (4-7): Interferes with normal activities or awakens from sleep.    - SEVERE (8-10): Excruciating pain, unable to do any normal activities.      severe 4. PATTERN: "Is the pain constant?" (e.g., yes, no; constant, intermittent)      yes 6. CAUSE:  "What do you think is causing the back pain?"      Disc disease  8. MEDICINES: "What have you taken so far for the pain?" (e.g., nothing,  acetaminophen, NSAIDS)     Pain medication - muscle relaxer 9. NEUROLOGIC SYMPTOMS: "Do you have any weakness, numbness, or problems with bowel/bladder control?"     no 10. OTHER SYMPTOMS: "Do you have any other symptoms?" (e.g., fever, abdomen pain, burning with urination, blood in urine)       Can't even roll over  Protocols used: Back Pain-A-AH

## 2023-09-14 ENCOUNTER — Ambulatory Visit (INDEPENDENT_AMBULATORY_CARE_PROVIDER_SITE_OTHER): Payer: Medicare Other | Admitting: Physician Assistant

## 2023-09-14 ENCOUNTER — Ambulatory Visit: Payer: Self-pay | Admitting: Family Medicine

## 2023-09-14 VITALS — BP 100/64 | HR 60 | Resp 18 | Ht 70.0 in | Wt 163.0 lb

## 2023-09-14 DIAGNOSIS — G8929 Other chronic pain: Secondary | ICD-10-CM | POA: Diagnosis not present

## 2023-09-14 DIAGNOSIS — M545 Low back pain, unspecified: Secondary | ICD-10-CM | POA: Diagnosis not present

## 2023-09-14 MED ORDER — PREDNISONE 20 MG PO TABS: 20.0000 mg | ORAL_TABLET | Freq: Every day | ORAL | 0 refills | Status: DC

## 2023-09-14 NOTE — Telephone Encounter (Signed)
 Pt is calling back from yesterday NT for follow up. Pt states that pain is worsening and had weakness in legs and noticed change in gait. Pt seen daughter this past weekend and she noticed difference in pt's mobility as well. Pt is unsure if L1-L2 has shifted something causing sharp pains and discomfort or not. Pt states he hasn't seen Ortho in 15 + years when he lived in [place]. Offered pt OV today at 80 with [place], [place] or could send HP message back to provider to see recommendations since they haven't followed up with pt yet. Pt discussed with wife and preferred to come in. Appt scheduled and advised I would send message back to PCP so she is aware of what's going on and can FU with pt if needed.   Copied from CRM [id number]. Topic: Clinical - Red Word Triage >> Sep 14, 2023 10:36 AM Everette C wrote: [facility] that prompted transfer to Nurse Triage: The patient has called to share that they continue to experience significant lower back pain and lower body discomfort   The patient shares that they have ongoing concerns related to a degenerative back issue

## 2023-09-14 NOTE — Telephone Encounter (Signed)
 Please see NT encounter from 09/13/23 for reference since this was same issue.   Copied from CRM [id number]. Topic: Clinical - Red Word Triage >> Sep 14, 2023 10:36 AM Everette C wrote: [facility] that prompted transfer to Nurse Triage: The patient has called to share that they continue to experience significant lower back pain and lower body discomfort  The patient shares that they have ongoing concerns related to a degenerative back issue

## 2023-09-14 NOTE — Telephone Encounter (Signed)
 Brian [name]. Likely needs Ortho vs NSG referral. Could try prednisone maybe.

## 2023-09-14 NOTE — Progress Notes (Addendum)
 Established patient visit  Patient: Brian [name]   DOB: [birth year]   83 y.o. Male  MRN: [id number] Visit Date: 09/14/2023  Today's healthcare provider: Debera Lat, PA-C   Chief Complaint  Patient presents with   Back Pain    Chronic back pain for years, about 10 days ago he had pain increase and flare up, pain is not improving   Subjective      Discussed the use of AI scribe software for clinical note transcription with the patient, who gave verbal consent to proceed.  History of Present Illness   The patient, with a history of back pain, presents with worsening symptoms. The back pain has been previously managed with prednisone, baclofen, and opioids. The patient has also tried dry needling for chronic back pain. Recently, the patient has noticed a new onset of leg weakness and an unstable gait, which has not been present before. Denies having pain in legs with exercise or at rest. The patient denies any recent trauma or visual disturbances. The patient has a history of ischemic strokes in the retina. 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     Patient was seen by his primary on 09/07/2023.  Has history of degenerative disc disease and scoliosis, back pain is severe and sharp and exacerbated with any movements.  Denies any changes in bowel and bladder habits    09/14/2023    2:42 PM 05/25/2023    3:13 PM 02/11/2023   11:05 AM  Depression screen PHQ 2/9  Decreased Interest 0 0 0  Down, Depressed, Hopeless 0 1 0  PHQ - 2 Score 0 1 0  Altered sleeping 0 0 0  Tired, decreased energy 0 0 0  Change in appetite 0 0 0  Feeling bad or failure about yourself  0 0 0  Trouble concentrating 0 0 0  Moving slowly or fidgety/restless 0 0 0  Suicidal thoughts 0 0 0  PHQ-9 Score 0 1 0  Difficult doing work/chores  Not difficult at all Not difficult at all      09/14/2023     2:42 PM  GAD 7 : Generalized Anxiety Score  Nervous, Anxious, on Edge 3  Control/stop worrying 0  Worry too much - different things 0  Trouble relaxing 0  Restless 0  Easily annoyed or irritable 0  Afraid - awful might happen 0  Total GAD 7 Score 3    Medications: Outpatient Medications Prior to Visit  Medication Sig   amLODipine (NORVASC) 10 MG tablet Take 1 tablet (10 mg total) by mouth daily.   aspirin 81 MG tablet Take 81 mg by mouth at bedtime.   atorvastatin (LIPITOR) 40 MG tablet Take 1 tablet (40 mg total) by mouth at bedtime.   baclofen (LIORESAL) 10 MG tablet Take 1 tablet (10 mg total) by mouth 3 (three) times daily as needed for muscle spasms.   Cholecalciferol (VITAMIN D3) 50 MCG (2000 UT) CAPS Take by mouth daily.   Coenzyme Q10 (COQ10) 100 MG CAPS Take 1 capsule by mouth daily.     furosemide (LASIX) 20 MG tablet Take 1 tablet (20 mg) by mouth once daily as needed for swelling   meclizine (ANTIVERT) 25 MG tablet Take 1 tablet (25 mg total) by mouth 2 (two) times daily as needed for dizziness.   metroNIDAZOLE (FLAGYL) 500 MG tablet Take 1 tablet (500 mg total) by  mouth 2 (two) times daily.   Multiple Vitamin (MULTIVITAMIN PO) Take 1 tablet by mouth daily.   nitroGLYCERIN (NITROSTAT) 0.4 MG SL tablet Place 1 tablet (0.4 mg total) under the tongue every 5 (five) minutes as needed for chest pain.   Oxycodone HCl 10 MG TABS Take 1-2 tablets (10-20 mg total) by mouth every 6 (six) hours as needed (moderate to severe pain).   predniSONE (DELTASONE) 10 MG tablet Take 60mg  PO daily x1d, then 50mg  daily x1d, then 40mg  daily x1d, then 30mg  daily x1d, then 20mg  daily x1d, then 10mg  daily x1d, then stop   saccharomyces boulardii (FLORASTOR) 250 MG capsule Take 250 mg by mouth 2 (two) times daily.   hydrALAZINE (APRESOLINE) 25 MG tablet Take 1 tablet (25 mg total) by mouth 3 (three) times daily. Take as needed for blood pressure greater then 140.   Facility-Administered  Medications Prior to Visit  Medication Dose Route Frequency Provider   albuterol (PROVENTIL) (2.5 MG/3ML) 0.083% nebulizer solution 2.5 mg  2.5 mg Nebulization Once Stoneking, Hal, MD    Review of Systems All negative Except see HPI       Objective    BP 100/64   Pulse 60   Resp 18   Ht 5\' 10"  (1.778 m)   Wt 163 lb (73.9 kg)   SpO2 98%   BMI 23.39 kg/m     Physical Exam Vitals reviewed.  Constitutional:      General: He is not in acute distress.    Appearance: Normal appearance. He is not diaphoretic.  HENT:     Head: Normocephalic and atraumatic.  Eyes:     General: No scleral icterus.    Conjunctiva/sclera: Conjunctivae normal.  Cardiovascular:     Rate and Rhythm: Normal rate and regular rhythm.     Pulses: Normal pulses.     Heart sounds: Normal heart sounds. No murmur heard. Pulmonary:     Effort: Pulmonary effort is normal. No respiratory distress.     Breath sounds: Normal breath sounds. No wheezing or rhonchi.  Musculoskeletal:        General: Tenderness (Lower back, BL) present.     Cervical back: Neck supple.     Right lower leg: No edema.     Left lower leg: No edema.  Lymphadenopathy:     Cervical: No cervical adenopathy.  Skin:    General: Skin is warm and dry.     Findings: No rash.  Neurological:     Mental Status: He is alert and oriented to person, place, and time. Mental status is at baseline.     Sensory: Sensory deficit (Diminished sense of touch) present.     Motor: Weakness (In both legs, below knees) present.  Psychiatric:        Mood and Affect: Mood normal.        Behavior: Behavior normal.      No results found for any visits on 09/14/23.      Assessment and Plan    Lower Back Pain Worsening pain with new onset leg weakness and imbalance. X-ray results were discussed.  Completed course of prednisone a couple of days ago, currently on Baclofen, and Oxycodone. Has tried dry needling in the past for chronic pain. Discussed the  stages of back pain management and the importance of staying active. Noted the potential need for orthopedic or neurologic intervention given the new symptoms. -Order Lumbar MRI to further evaluate the cause of the pain and new symptoms. -Continue Baclofen, and  Oxycodone as prescribed. -Consider ice/heat therapy and Epsom salt baths for additional pain relief. -Refer to Orthopedics and NSG for further evaluation and management. -Continue physical therapy and consider adding massage therapy. -Check vitals regularly to monitor overall health status. Prescribed prednisone course again Advised to continue with physical therapy for dry needling and trigger point therapy Imaging result from 09/07/2023 was discussed: Multilevel severe degenerative disc disease and moderate dextroscoliosis of the lumbar spine.  Patient requested further imaging due to worsening of symptoms  Orders Placed This Encounter  Procedures   MR LUMBAR SPINE WO CONTRAST    Standing Status:   Future    Expiration Date:   09/13/2024    What is the patient's sedation requirement?:   No Sedation    Does the patient have a pacemaker or implanted devices?:   No    Preferred imaging location?:   [facility] (table limit-350lbs)   Ambulatory referral to Physical Therapy    Referral Priority:   Urgent    Referral Type:   Physical Medicine    Referral Reason:   Specialty Services Required    Requested Specialty:   Physical Therapy    Number of Visits Requested:   1   AMB referral to orthopedics    Referral Priority:   Urgent    Referral Type:   Consultation    Number of Visits Requested:   1    No follow-ups on file.   The patient was advised to call back or seek an in-person evaluation if the symptoms worsen or if the condition fails to improve as anticipated.  I discussed the assessment and treatment plan with the patient. The patient was provided an opportunity to ask questions and all were answered. The patient agreed  with the plan and demonstrated an understanding of the instructions.  I, Debera Lat, PA-C have reviewed all documentation for this visit. The documentation on 09/14/2023  for the exam, diagnosis, procedures, and orders are all accurate and complete.  Debera Lat, [facility], MMS [facility] [phone] (phone) [phone] (fax)  [facility]

## 2023-09-15 ENCOUNTER — Telehealth: Payer: Self-pay | Admitting: Internal Medicine

## 2023-09-15 ENCOUNTER — Encounter: Payer: Self-pay | Admitting: Physician Assistant

## 2023-09-15 ENCOUNTER — Ambulatory Visit
Admission: RE | Admit: 2023-09-15 | Discharge: 2023-09-15 | Disposition: A | Discharge status: Home / Self Care | Payer: Medicare Other | Source: Ambulatory Visit | Attending: Physician Assistant | Admitting: Physician Assistant

## 2023-09-15 DIAGNOSIS — M545 Low back pain, unspecified: Secondary | ICD-10-CM | POA: Diagnosis not present

## 2023-09-15 DIAGNOSIS — G8929 Other chronic pain: Secondary | ICD-10-CM | POA: Insufficient documentation

## 2023-09-15 DIAGNOSIS — R29890 Loss of height: Secondary | ICD-10-CM | POA: Diagnosis not present

## 2023-09-15 DIAGNOSIS — M47816 Spondylosis without myelopathy or radiculopathy, lumbar region: Secondary | ICD-10-CM | POA: Diagnosis not present

## 2023-09-15 DIAGNOSIS — M5126 Other intervertebral disc displacement, lumbar region: Secondary | ICD-10-CM | POA: Diagnosis not present

## 2023-09-15 DIAGNOSIS — M48061 Spinal stenosis, lumbar region without neurogenic claudication: Secondary | ICD-10-CM | POA: Diagnosis not present

## 2023-09-15 NOTE — Telephone Encounter (Signed)
 Received a call from the RN triage about patient's MRI results which showed T12 compression fracture. Called patient to discuss results but was unable to reach the patient.

## 2023-09-16 ENCOUNTER — Other Ambulatory Visit: Payer: Self-pay | Admitting: Physician Assistant

## 2023-09-16 ENCOUNTER — Ambulatory Visit: Payer: Medicare Other | Admitting: Internal Medicine

## 2023-09-16 ENCOUNTER — Encounter: Payer: Self-pay | Admitting: Family Medicine

## 2023-09-16 DIAGNOSIS — G894 Chronic pain syndrome: Secondary | ICD-10-CM

## 2023-09-16 DIAGNOSIS — Z79899 Other long term (current) drug therapy: Secondary | ICD-10-CM | POA: Diagnosis not present

## 2023-09-16 DIAGNOSIS — S22000A Wedge compression fracture of unspecified thoracic vertebra, initial encounter for closed fracture: Secondary | ICD-10-CM

## 2023-09-16 MED ORDER — ALENDRONATE SODIUM 70 MG PO TABS: 70.0000 mg | ORAL_TABLET | ORAL | 11 refills | Status: AC

## 2023-09-16 NOTE — Progress Notes (Signed)
 Please, let pt know that he has acute compression fracture as well as a multiple degenerative changes of the lumbar spine and foraminal stenosis.  Advised to see orthopedic surgeon or neurosurgeon. He has been scheduled with the orthopedic surgeon on 09/20/23 by our scheduler, but if he cannot tolerate symptoms please proceed to [facility].  Continue opioids for pain control, I can call in meloxicam for an additional control, continue to take a muscle relaxant/all prescribed by Dr. Leonard Schwartz during your last appointment with her.

## 2023-09-16 NOTE — Progress Notes (Signed)
 Please, let pt know that fosamax was called in for him. Before starting on this medication, advised to do an extra test for assessing kidney function.  Alendronate is indicated for the treatment of osteoporosis in men, which can help increase bone mass and reduce the risk of fractures

## 2023-09-17 ENCOUNTER — Encounter: Payer: Self-pay | Admitting: Internal Medicine

## 2023-09-17 ENCOUNTER — Ambulatory Visit: Payer: Medicare Other | Admitting: Internal Medicine

## 2023-09-17 ENCOUNTER — Other Ambulatory Visit: Payer: Self-pay | Admitting: Family Medicine

## 2023-09-17 VITALS — BP 120/84 | HR 65 | Temp 97.6°F | Ht 70.0 in | Wt 167.2 lb

## 2023-09-17 DIAGNOSIS — R9082 White matter disease, unspecified: Secondary | ICD-10-CM

## 2023-09-17 DIAGNOSIS — J439 Emphysema, unspecified: Secondary | ICD-10-CM

## 2023-09-17 DIAGNOSIS — R058 Other specified cough: Secondary | ICD-10-CM

## 2023-09-17 DIAGNOSIS — Z720 Tobacco use: Secondary | ICD-10-CM

## 2023-09-17 DIAGNOSIS — G4733 Obstructive sleep apnea (adult) (pediatric): Secondary | ICD-10-CM | POA: Diagnosis not present

## 2023-09-17 DIAGNOSIS — H814 Vertigo of central origin: Secondary | ICD-10-CM | POA: Diagnosis not present

## 2023-09-17 DIAGNOSIS — Z86711 Personal history of pulmonary embolism: Secondary | ICD-10-CM

## 2023-09-17 DIAGNOSIS — F17210 Nicotine dependence, cigarettes, uncomplicated: Secondary | ICD-10-CM | POA: Diagnosis not present

## 2023-09-17 DIAGNOSIS — R531 Weakness: Secondary | ICD-10-CM | POA: Diagnosis not present

## 2023-09-17 DIAGNOSIS — M25641 Stiffness of right hand, not elsewhere classified: Secondary | ICD-10-CM | POA: Diagnosis not present

## 2023-09-17 DIAGNOSIS — R2681 Unsteadiness on feet: Secondary | ICD-10-CM | POA: Diagnosis not present

## 2023-09-17 LAB — BASIC METABOLIC PANEL
BUN/Creatinine Ratio: 17 (ref 10–24)
BUN: 16 mg/dL (ref 8–27)
CO2: 20 mmol/L (ref 20–29)
Calcium: 9.6 mg/dL (ref 8.6–10.2)
Chloride: 103 mmol/L (ref 96–106)
Creatinine, Ser: 0.96 mg/dL (ref 0.76–1.27)
Glucose: 111 mg/dL — ABNORMAL HIGH (ref 70–99)
Potassium: 5.4 mmol/L — ABNORMAL HIGH (ref 3.5–5.2)
Sodium: 140 mmol/L (ref 134–144)
eGFR: 78 mL/min/{1.73_m2} (ref >59)

## 2023-09-17 MED ORDER — ALBUTEROL SULFATE HFA 108 (90 BASE) MCG/ACT IN AERS: 2.0000 | INHALATION_SPRAY | Freq: Four times a day (QID) | RESPIRATORY_TRACT | 2 refills | Status: AC | PRN

## 2023-09-17 NOTE — Progress Notes (Signed)
 Name: Brian [name] MRN: [id number] DOB: [birth year]    SYNOPSIS Patient was admitted to the ICU  for E. coli sepsis with metabolic encephalopathy with acute liver failure patient underwent ERCP for gallbladder disease found to have sludge There is residual imbalance Patient does have some shortness of breath and dyspnea on exertion Patient is a longtime smoker 1 pack a day for the last 70 years Patient quit tobacco abuse last week Patient had a history of coronary artery disease with 2 stents placed Previously had been seeing Dr. Katrinka Blazing cardiology in [place] Patient also on oral anticoagulation for diagnosis of PE    CHIEF COMPLAINT:  Follow-up assessment for OSA Follow-up assessment for emphysema  HISTORY OF PRESENT ILLNESS: Follow-up assessment for OSA Patient is very compliant with his CPAP His previous AHI was 9.9 and I increase his CPAP prescription to 9 cm of water pressure Since increasing his CPAP to 9 patient is AHI reduced to 7.6 I have much more satisfied with this improvement Compliance report reviewed in detail with patient Patient is feeling well overall Patient uses and benefits from therapy Patient now has a mask that fits well  Patient currently has a T12 spinal fracture I have explained to him the pain could inhibit sleep quality Patient is currently on oxycodone  I have also explained that pain could hinder breathing I recommended breathing exercises Since he smokes he has intermittent cough Will provide albuterol as needed  History of CAD No exacerbation at this time No evidence of heart failure at this time No evidence or signs of infection at this time No respiratory distress No fevers, chills, nausea, vomiting, diarrhea No evidence of lower extremity edema No evidence hemoptysis    Emphysema on CT scan No significant respiratory issues at this time Patient is not on any inhalers Patient is very active for his age   Patient with a  history of pulmonary embolism Weaned off of anticoagulation   PAST MEDICAL HISTORY :   has a past medical history of AAA (abdominal aortic aneurysm) (HCC), Adenomatous polyp, Anxiety, Carpal tunnel syndrome on left, Cervical spine disease, Colitis, Coronary artery disease, Deviated septum, Hyperlipidemia, Hypertension, Ischemic cardiomyopathy (05/19/2022), Low back pain, Myocardial infarction (HCC) (1998), Personal history of pulmonary embolism, Pulmonary embolism (HCC) (08/26/2022), Sleep apnea, and Wears dentures.  has a past surgical history that includes Appendectomy; Hernia repair; Hemorrhoid banding; bunions; Cataract extraction; Colonoscopy with propofol (N/A, 02/19/2015); Colonoscopy with propofol (N/A, 06/05/2019); polypectomy (N/A, 06/05/2019); ERCP (N/A, 08/25/2022); Olecranon bursectomy (Right); and Aqueous shunt (Right, 10/26/2022). Prior to Admission medications   Medication Sig Start Date End Date Taking? Authorizing Provider  apixaban (ELIQUIS) 5 MG TABS tablet Take 2 tablets (10 mg total) by mouth 2 (two) times daily for 7 days, THEN 1 tablet (5 mg total) 2 (two) times daily. 08/29/22 11/27/22  Tresa Moore, MD  aspirin 81 MG tablet Take 81 mg by mouth at bedtime.    [provider]  atorvastatin (LIPITOR) 40 MG tablet Take 40 mg by mouth at bedtime.    [provider]  benzonatate (TESSALON) 200 MG capsule Take 1 capsule (200 mg total) by mouth 3 (three) times daily as needed for cough. 08/29/22   Tresa Moore, MD  ciprofloxacin (CIPRO) 500 MG tablet Take 1 tablet (500 mg total) by mouth 2 (two) times daily for 11 days. 08/29/22 09/09/22  Tresa Moore, MD  Coenzyme Q10 (COQ10) 100 MG CAPS Take 1 capsule by mouth daily.  [provider]  latanoprost (XALATAN) 0.005 % ophthalmic solution Place 1 drop into the right eye at bedtime. 03/04/22   [provider]  lisinopril (ZESTRIL) 20 MG tablet Take 1 tablet (20 mg total) by mouth  daily. Patient taking differently: Take 20 mg by mouth at bedtime. 07/22/22   Lyn Records, MD  meclizine (ANTIVERT) 25 MG tablet Take 1 tablet (25 mg total) by mouth 2 (two) times daily as needed for dizziness. 08/29/22   Tresa Moore, MD  metoprolol succinate (TOPROL-XL) 25 MG 24 hr tablet TAKE 1 TABLET(25 MG) BY MOUTH DAILY Patient taking differently: Take 25 mg by mouth at bedtime. 08/18/22   Tereso Newcomer T, PA-C  Multiple Vitamin (MULTIVITAMIN PO) Take 1 tablet by mouth daily.    [provider]  nitroGLYCERIN (NITROSTAT) 0.4 MG SL tablet Place 0.4 mg under the tongue every 5 (five) minutes as needed for chest pain.    [provider]  Oxycodone HCl 10 MG TABS Take 10-20 mg by mouth every 6 (six) hours as needed (moderate to severe pain).    [provider]   No Known Allergies  FAMILY HISTORY:  family history includes Alzheimer's disease in his maternal grandmother; Colon cancer in his maternal grandfather; Coronary artery disease in his brother; Heart failure in his mother; Hypertension in his father; Kidney disease in his father. SOCIAL HISTORY:  reports that he has been smoking cigarettes. He started smoking about 65 years ago. He has a 32.6 pack-year smoking history. He has never used smokeless tobacco. He reports that he does not currently use alcohol. He reports that he does not use drugs.    BP 120/84 (BP Location: Right Arm, Patient Position: Sitting, Cuff Size: Normal)   Pulse 65   Temp 97.6 F (36.4 C) (Temporal)   Ht 5\' 10"  (1.778 m)   Wt 167 lb 3.2 oz (75.8 kg)   SpO2 95%   BMI 23.99 kg/m       Review of Systems: Gen:  Denies  fever, sweats, chills weight loss  HEENT: Denies blurred vision, double vision, ear pain, eye pain, hearing loss, nose bleeds, sore throat Cardiac:  No dizziness, chest pain or heaviness, chest tightness,edema, No JVD Resp:   No cough, -sputum production, -shortness of breath,-wheezing, -hemoptysis,  Other:   All other systems negative   Physical Examination:   General Appearance: No distress  EYES PERRLA, EOM intact.   NECK Supple, No JVD Pulmonary: normal breath sounds, No wheezing.  CardiovascularNormal S1,S2.  No m/r/g.   Abdomen: Benign, Soft, non-tender. Neurology UE/LE 5/5 strength, no focal deficits Ext pulses intact, cap refill intact ALL OTHER ROS ARE NEGATIVE   ASSESSMENT AND PLAN SYNOPSIS 83 year old pleasant white male seen today for follow-up assessment for multiple medical issues including diagnosis of obstructive sleep apnea diagnosed 10 years ago with history of coronary artery disease and myocardial infarction with cardiac stents with previous admission to the ICU for E. coli sepsis and metabolic encephalopathy with a long standing history of pulmonary embolism with ongoing tobacco abuse now with acute spinal fracture  Assessment of OSA The increase of CPAP to 9 has reduced his AHI to 7.6 I am much more satisfied with this number as patient does not have any symptoms Compliance report reviewed in detail with patient OSA is well-controlled with CPAP Continue current prescription  Patient Instructions Continue to use CPAP every night, minimum of 4-6 hours a night.  Change equipment every 30 days or as directed by  DME.  Wash your tubing with warm soap and water daily, hang to dry. Wash humidifier portion weekly. Use bottled, distilled water and change daily   Be aware of reduced alertness and do not drive or operate heavy machinery if experiencing this or drowsiness.  Exercise encouraged, as tolerated. Encouraged proper weight management.  Important to get eight or more hours of sleep  Limiting the use of the computer and television before bedtime.  Decrease naps during the day, so night time sleep will become enhanced.  Limit caffeine, and sleep deprivation.  HTN, stroke, uncontrolled diabetes and heart failure are potential risk factors.  Risk of untreated sleep  apnea including cardiac arrhthymias, stroke, DM, pulm HTN.      Assessment of emphysema No maintenance inhalers at this time No evidence of heart failure at this time No evidence or signs of infection at this time No respiratory distress No fevers, chills, nausea, vomiting, diarrhea No evidence of lower extremity edema No evidence hemoptysis We will provide albuterol as needed    Assessment pulmonary embolism Patient no longer on anticoagulation therapy  History of coronary artery disease Follow-up with cardiology  CT of the head shows white matter disease Patient with imbalance issues Follow-up neurologyDeconditioned state -Recommend increased daily activity and exercise  Spinal fracture Recommend pain control Recommend breathing exercises to avoid atelectasis   Smoking Assessment and Cessation Counseling Upon further questioning, Patient smokes 1 ppd I have advised patient to quit/stop smoking as soon as possible due to high risk for multiple medical problems  Patient is NOT willing to quit smoking  I have advised patient that we can assist and have options of Nicotine replacement therapy. I also advised patient on behavioral therapy and can provide oral medication therapy in conjunction with the other therapies Follow up next Office visit  for assessment of smoking cessation Smoking cessation counseling advised for 4 minutes    MEDICATION ADJUSTMENTS/LABS AND TESTS ORDERED: Continue CPAP to 9 cm water pressure Avoid Allergens and Irritants Avoid secondhand smoke Avoid SICK contacts Recommend  Masking  when appropriate Recommend Keep up-to-date with vaccinations   CURRENT MEDICATIONS REVIEWED AT LENGTH WITH PATIENT TODAY   Patient  satisfied with Plan of action and management. All questions answered  Follow up 3 months   Total Time Spent  43 mins   Lucie Leather, M.D.  [facility]  Medical Director [facility]  [facility] Medical Director [facility] Cardio-Pulmonary Department

## 2023-09-17 NOTE — Patient Instructions (Signed)
 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  Continue CPAP as prescribed  Will prescribe albuterol inhaler 2 puffs every 4-6 hours as needed for cough and shortness of breath  Avoid Allergens and Irritants Avoid secondhand smoke Avoid SICK contacts Recommend  Masking  when appropriate Recommend Keep up-to-date with vaccinations   Be aware of reduced alertness and do not drive or operate heavy machinery if experiencing this or drowsiness.  Exercise encouraged, as tolerated. Encouraged proper weight management.  Important to get eight or more hours of sleep  Limiting the use of the computer and television before bedtime.  Decrease naps during the day, so night time sleep will become enhanced.  Limit caffeine, and sleep deprivation.

## 2023-09-18 ENCOUNTER — Encounter: Payer: Self-pay | Admitting: Physician Assistant

## 2023-09-20 ENCOUNTER — Other Ambulatory Visit: Payer: Self-pay | Admitting: Physician Assistant

## 2023-09-20 DIAGNOSIS — G8929 Other chronic pain: Secondary | ICD-10-CM

## 2023-09-20 DIAGNOSIS — S22000A Wedge compression fracture of unspecified thoracic vertebra, initial encounter for closed fracture: Secondary | ICD-10-CM

## 2023-09-20 DIAGNOSIS — M5416 Radiculopathy, lumbar region: Secondary | ICD-10-CM | POA: Diagnosis not present

## 2023-09-20 NOTE — Telephone Encounter (Signed)
 2nd Request-pharmacy faxed refill request for the following medications:   baclofen (LIORESAL) 10 MG tablet    Please advise

## 2023-09-20 NOTE — Telephone Encounter (Signed)
 Requested Prescriptions  Pending Prescriptions Disp Refills   baclofen (LIORESAL) 10 MG tablet [Pharmacy Med Name: BACLOFEN 10MG  TABLETS] 30 tablet 0    Sig: TAKE 1 TABLET(10 MG) BY MOUTH THREE TIMES DAILY AS NEEDED FOR MUSCLE SPASMS     Analgesics:  Muscle Relaxants - baclofen Passed - 09/20/2023 12:58 PM      Passed - Cr in normal range and within 180 days    Creatinine  Date Value Ref Range Status  02/26/2014 1.07 0.60 - 1.30 mg/dL Final   Creatinine, Ser  Date Value Ref Range Status  09/16/2023 0.96 0.76 - 1.27 mg/dL Final         Passed - eGFR is 30 or above and within 180 days    EGFR (African American)  Date Value Ref Range Status  02/26/2014 >60  Final   GFR calc Af Amer  Date Value Ref Range Status  02/15/2016 >60 >60 mL/min Final    Comment:    (NOTE) The eGFR has been calculated using the CKD EPI equation. This calculation has not been validated in all clinical situations. eGFR's persistently <60 mL/min signify possible Chronic Kidney Disease.    EGFR (Non-African Amer.)  Date Value Ref Range Status  02/26/2014 >60  Final    Comment:    eGFR values <34mL/min/1.73 m2 may be an indication of chronic kidney disease (CKD). Calculated eGFR is useful in patients with stable renal function. The eGFR calculation will not be reliable in acutely ill patients when serum creatinine is changing rapidly. It is not useful in  patients on dialysis. The eGFR calculation may not be applicable to patients at the low and high extremes of body sizes, pregnant women, and vegetarians.    GFR, Estimated  Date Value Ref Range Status  06/08/2023 >60 >60 mL/min Final    Comment:    (NOTE) Calculated using the CKD-EPI Creatinine Equation (2021)    eGFR  Date Value Ref Range Status  09/16/2023 78 >59 mL/min/1.73 Final         Passed - Valid encounter within last 6 months    Recent Outpatient Visits           2 months ago Skin lesion   [facility] Malva Limes, MD   3 months ago Skin lesion   [facility] Malva Limes, MD   3 months ago Encounter for annual physical exam   [facility] [place], Marzella Schlein, MD   7 months ago Prediabetes   [facility] [place], Marzella Schlein, MD   7 months ago Lower extremity edema   [facility] [place], Marzella Schlein, MD       Future Appointments             In 2 weeks Deirdre Evener, MD [facility]   In 3 weeks [name], MD [facility] [place]   In 2 months Bacigal[place], Marzella Schlein, MD [facility]   In 2 months Erin Fulling, MD [facility] [place]   In 11 months Stoioff, Verna Czech, MD [facility]

## 2023-09-21 ENCOUNTER — Ambulatory Visit: Payer: Self-pay | Admitting: Family Medicine

## 2023-09-21 NOTE — Telephone Encounter (Signed)
 Fyi - seeing you tomorrow

## 2023-09-21 NOTE — Telephone Encounter (Signed)
  Chief Complaint: worsening cough Symptoms: barky cough Frequency: 3 days Pertinent Negatives: Patient denies CP, SOB, fever Disposition: [] ED /[] Urgent Care (no appt availability in office) / [x] Appointment(In office/virtual)/ []  [facility] [facility]/ [] Home Care/ [] Refused Recommended Disposition /[] [place] [facility]/ []  Follow-up with PCP Additional Notes: Patient calls reporting cough x 3 days. States it is mostly non productive, but can be severe at times. Per protocol, patient to be evaluated within 24 hours. First available appointment with PCP outside of guideline. Patient scheduled with first available provider in clinic for 09/22/23 @ 1340. Care advice reviewed, patient verbalized understanding and denies further questions at this time. Alerting PCP for review.    Copied from CRM [id number]. Topic: Clinical - Red Word Triage >> Sep 21, 2023  9:45 AM Tiffany S wrote: [facility] that prompted transfer to Nurse Triage: Increased cough for past 3 days Reason for Disposition  [1] Continuous (nonstop) coughing interferes with work or school AND [2] no improvement using cough treatment per Care Advice  Answer Assessment - Initial Assessment Questions 1. ONSET: "When did the cough begin?"      3 days ago, began dry 2. SEVERITY: "How bad is the cough today?"      When first waking, 6 times, has subsided some 3. SPUTUM: "Describe the color of your sputum" (none, dry cough; clear, white, yellow, green)     no color 4. HEMOPTYSIS: "Are you coughing up any blood?" If so ask: "How much?" (flecks, streaks, tablespoons, etc.)     Denies 5. DIFFICULTY BREATHING: "Are you having difficulty breathing?" If Yes, ask: "How bad is it?" (e.g., mild, moderate, severe)    - MILD: No SOB at rest, mild SOB with walking, speaks normally in sentences, can lie down, no retractions, pulse < 100.    - MODERATE: SOB at rest, SOB with minimal exertion and prefers to sit, cannot lie down flat, speaks in phrases,  mild retractions, audible wheezing, pulse 100-120.    - SEVERE: Very SOB at rest, speaks in single words, struggling to breathe, sitting hunched forward, retractions, pulse > 120      Denies 6. FEVER: "Do you have a fever?" If Yes, ask: "What is your temperature, how was it measured, and when did it start?"     Denies 7. CARDIAC HISTORY: "Do you have any history of heart disease?" (e.g., heart attack, congestive heart failure)      Heart attack in 98 8. LUNG HISTORY: "Do you have any history of lung disease?"  (e.g., pulmonary embolus, asthma, emphysema)     PE 8 months ago, resolved with eliquis, beginning of emphysema 9. PE RISK FACTORS: "Do you have a history of blood clots?" (or: recent major surgery, recent prolonged travel, bedridden)     Denies 10. OTHER SYMPTOMS: "Do you have any other symptoms?" (e.g., runny nose, wheezing, chest pain)       Runny nose  12. TRAVEL: "Have you traveled out of the country in the last month?" (e.g., travel history, exposures)       Denies  Protocols used: Cough - Acute Non-Productive-A-AH

## 2023-09-22 ENCOUNTER — Encounter: Payer: Self-pay | Admitting: Physician Assistant

## 2023-09-22 ENCOUNTER — Other Ambulatory Visit: Payer: Self-pay | Admitting: Family Medicine

## 2023-09-22 ENCOUNTER — Ambulatory Visit (INDEPENDENT_AMBULATORY_CARE_PROVIDER_SITE_OTHER): Admitting: Physician Assistant

## 2023-09-22 VITALS — BP 119/79 | HR 68 | Wt 164.4 lb

## 2023-09-22 DIAGNOSIS — M545 Low back pain, unspecified: Secondary | ICD-10-CM | POA: Diagnosis not present

## 2023-09-22 DIAGNOSIS — S22000D Wedge compression fracture of unspecified thoracic vertebra, subsequent encounter for fracture with routine healing: Secondary | ICD-10-CM | POA: Diagnosis not present

## 2023-09-22 DIAGNOSIS — S22000A Wedge compression fracture of unspecified thoracic vertebra, initial encounter for closed fracture: Secondary | ICD-10-CM

## 2023-09-22 DIAGNOSIS — G8929 Other chronic pain: Secondary | ICD-10-CM

## 2023-09-22 DIAGNOSIS — R058 Other specified cough: Secondary | ICD-10-CM | POA: Diagnosis not present

## 2023-09-22 NOTE — Progress Notes (Unsigned)
 Established patient visit  Patient: Brian [name]   DOB: [birth year]   83 y.o. Male  MRN: [id number] Visit Date: 09/22/2023  Today's healthcare provider: Debera Lat, PA-C   No chief complaint on file.  Subjective       Discussed the use of AI scribe software for clinical note transcription with the patient, who gave verbal consent to proceed.  History of Present Illness               09/14/2023    2:42 PM 05/25/2023    3:13 PM 02/11/2023   11:05 AM  Depression screen PHQ 2/9  Decreased Interest 0 0 0  Down, Depressed, Hopeless 0 1 0  PHQ - 2 Score 0 1 0  Altered sleeping 0 0 0  Tired, decreased energy 0 0 0  Change in appetite 0 0 0  Feeling bad or failure about yourself  0 0 0  Trouble concentrating 0 0 0  Moving slowly or fidgety/restless 0 0 0  Suicidal thoughts 0 0 0  PHQ-9 Score 0 1 0  Difficult doing work/chores  Not difficult at all Not difficult at all      09/14/2023    2:42 PM  GAD 7 : Generalized Anxiety Score  Nervous, Anxious, on Edge 3  Control/stop worrying 0  Worry too much - different things 0  Trouble relaxing 0  Restless 0  Easily annoyed or irritable 0  Afraid - awful might happen 0  Total GAD 7 Score 3    Medications: Outpatient Medications Prior to Visit  Medication Sig   albuterol (VENTOLIN HFA) 108 (90 Base) MCG/ACT inhaler Inhale 2 puffs into the lungs every 6 (six) hours as needed for wheezing or shortness of breath.   alendronate (FOSAMAX) 70 MG tablet Take 1 tablet (70 mg total) by mouth every 7 (seven) days. Take with a full glass of water on an empty stomach.   amLODipine (NORVASC) 10 MG tablet Take 1 tablet (10 mg total) by mouth daily.   aspirin 81 MG tablet Take 81 mg by mouth at bedtime.   atorvastatin (LIPITOR) 40 MG tablet Take 1 tablet (40 mg total) by mouth at bedtime.   baclofen (LIORESAL) 10 MG tablet TAKE 1 TABLET(10 MG) BY MOUTH THREE TIMES DAILY AS NEEDED FOR MUSCLE SPASMS   Cholecalciferol (VITAMIN D3) 50 MCG  (2000 UT) CAPS Take by mouth daily.   Coenzyme Q10 (COQ10) 100 MG CAPS Take 1 capsule by mouth daily.     furosemide (LASIX) 20 MG tablet Take 1 tablet (20 mg) by mouth once daily as needed for swelling   hydrALAZINE (APRESOLINE) 25 MG tablet Take 1 tablet (25 mg total) by mouth 3 (three) times daily. Take as needed for blood pressure greater then 140.   meclizine (ANTIVERT) 25 MG tablet Take 1 tablet (25 mg total) by mouth 2 (two) times daily as needed for dizziness.   metroNIDAZOLE (FLAGYL) 500 MG tablet Take 1 tablet (500 mg total) by mouth 2 (two) times daily.   Multiple Vitamin (MULTIVITAMIN PO) Take 1 tablet by mouth daily.   nitroGLYCERIN (NITROSTAT) 0.4 MG SL tablet Place 1 tablet (0.4 mg total) under the tongue every 5 (five) minutes as needed for chest pain.   Oxycodone HCl 10 MG TABS Take 1-2 tablets (10-20 mg total) by mouth every 6 (six) hours as needed (moderate to severe pain).   predniSONE (DELTASONE) 20 MG tablet Take 1 tablet (20 mg total) by mouth daily with breakfast.  saccharomyces boulardii (FLORASTOR) 250 MG capsule Take 250 mg by mouth 2 (two) times daily.   Facility-Administered Medications Prior to Visit  Medication Dose Route Frequency Provider   albuterol (PROVENTIL) (2.5 MG/3ML) 0.083% nebulizer solution 2.5 mg  2.5 mg Nebulization Once Stoneking, Hal, MD    Review of Systems All negative Except see HPI   {Insert previous labs (optional):23779} {See past labs  Heme  Chem  Endocrine  Serology  Results Review (optional):1}   Objective    There were no vitals taken for this visit. {Insert last BP/Wt (optional):23777}{See vitals history (optional):1}   Physical Exam   No results found for any visits on 09/22/23.      Assessment and Plan             No orders of the defined types were placed in this encounter.   No follow-ups on file.   The patient was advised to call back or seek an in-person evaluation if the symptoms worsen or if the  condition fails to improve as anticipated.  I discussed the assessment and treatment plan with the patient. The patient was provided an opportunity to ask questions and all were answered. The patient agreed with the plan and demonstrated an understanding of the instructions.  I, Debera Lat, PA-C have reviewed all documentation for this visit. The documentation on 09/22/2023  for the exam, diagnosis, procedures, and orders are all accurate and complete.  Debera Lat, [facility], MMS [facility] [phone] (phone) [phone] (fax)  [facility]

## 2023-09-23 MED ORDER — OXYCODONE HCL 10 MG PO TABS: 10.0000 mg | ORAL_TABLET | Freq: Four times a day (QID) | ORAL | 0 refills | Status: DC | PRN

## 2023-09-23 NOTE — Telephone Encounter (Signed)
 Change in pharmacy- will send remainder of Rx to [facility] Requested Prescriptions  Pending Prescriptions Disp Refills   atorvastatin (LIPITOR) 40 MG tablet [Pharmacy Med Name: Atorvastatin Calcium 40 MG Oral Tablet] 100 tablet 2    Sig: TAKE 1 TABLET BY MOUTH AT  BEDTIME     Cardiovascular:  Antilipid - Statins Failed - 09/23/2023  8:16 AM      Failed - Lipid Panel in normal range within the last 12 months    Cholesterol, Total  Date Value Ref Range Status  11/30/2022 118 100 - 199 mg/dL Final   Cholesterol  Date Value Ref Range Status  06/08/2023 119 0 - 200 mg/dL Final   LDL Chol Calc (NIH)  Date Value Ref Range Status  11/30/2022 41 0 - 99 mg/dL Final   LDL Cholesterol  Date Value Ref Range Status  06/08/2023 51 0 - 99 mg/dL Final    Comment:           Total Cholesterol/HDL:CHD Risk Coronary Heart Disease Risk Table                     Men   Women  1/2 Average Risk   3.4   3.3  Average Risk       5.0   4.4  2 X Average Risk   9.6   7.1  3 X Average Risk  23.4   11.0        Use the calculated Patient Ratio above and the CHD Risk Table to determine the patient's CHD Risk.        ATP III CLASSIFICATION (LDL):  <100     mg/dL   Optimal  098-119  mg/dL   Near or Above                    Optimal  130-159  mg/dL   Borderline  147-829  mg/dL   High  >562     mg/dL   Very High Performed at [facility], [address] [place], [place] [address]    HDL  Date Value Ref Range Status  06/08/2023 42 >40 mg/dL Final  57/84/6962 57 >95 mg/dL Final   Triglycerides  Date Value Ref Range Status  06/08/2023 129 <150 mg/dL Final         Passed - Patient is not pregnant      Passed - Valid encounter within last 12 months    Recent Outpatient Visits           2 months ago Skin lesion   [facility] Malva Limes, MD   3 months ago Skin lesion   [facility] Malva Limes, MD   3 months ago Encounter  for annual physical exam   [facility] [place], Marzella Schlein, MD   7 months ago Prediabetes   [facility] [place], Marzella Schlein, MD   7 months ago Lower extremity edema   [facility] [place], Marzella Schlein, MD       Future Appointments             In 1 week Deirdre Evener, MD [facility]   In 2 weeks [name], MD [facility] at [place]   In 2 months Bacigalupo, Marzella Schlein, MD [facility]   In 2 months Erin Fulling, MD [facility]  Pulmonary Care at [facility]   In 11 months Stoioff, Verna Czech, MD [facility]

## 2023-09-27 ENCOUNTER — Other Ambulatory Visit: Payer: Self-pay | Admitting: Orthopedic Surgery

## 2023-09-27 ENCOUNTER — Ambulatory Visit
Admission: RE | Admit: 2023-09-27 | Discharge: 2023-09-27 | Disposition: A | Discharge status: Home / Self Care | Attending: Orthopedic Surgery | Admitting: Orthopedic Surgery

## 2023-09-27 ENCOUNTER — Ambulatory Visit
Admission: RE | Admit: 2023-09-27 | Discharge: 2023-09-27 | Disposition: A | Discharge status: Home / Self Care | Source: Ambulatory Visit | Attending: Orthopedic Surgery | Admitting: Orthopedic Surgery

## 2023-09-27 DIAGNOSIS — M48061 Spinal stenosis, lumbar region without neurogenic claudication: Secondary | ICD-10-CM | POA: Diagnosis not present

## 2023-09-27 DIAGNOSIS — M5136 Other intervertebral disc degeneration, lumbar region with discogenic back pain only: Secondary | ICD-10-CM | POA: Diagnosis not present

## 2023-09-27 DIAGNOSIS — S22080A Wedge compression fracture of T11-T12 vertebra, initial encounter for closed fracture: Secondary | ICD-10-CM

## 2023-09-27 DIAGNOSIS — M47816 Spondylosis without myelopathy or radiculopathy, lumbar region: Secondary | ICD-10-CM | POA: Diagnosis not present

## 2023-09-27 DIAGNOSIS — R29890 Loss of height: Secondary | ICD-10-CM | POA: Diagnosis not present

## 2023-09-27 NOTE — Progress Notes (Unsigned)
 Referring Physician:  Erasmo Downer, MD [address] [place],  [place] [address]  Primary Physician:  Erasmo Downer, MD  History of Present Illness: 09/28/2023 [name] has a history of ischemic bowel disease, MI, HTN, CAD, ischemic cardiomyopathy, history of PE, sleep apnea, hyperlipidemia, chronic pain syndrome, skin CA.   3 weeks of mid to lower back pain that is constant, worse on left side. No leg pain. No pain radiating to ribs. Pain is slowly improving. He has numbness and weakness in both legs. History of right drop foot years ago and he still has weakness.   He saw [facility] for this and was told pain would improve with time. He has follow up with them on 10/28/23.   Wearing back brace purchased from his Chiropractor- he doesn't think this is helping.   PCP advised him to start fosamax.   He is taking baclofen prn.   He is caregiver for his wife.   He smokes 1/2 PPD x 60+ years. He quit last week.   Bowel or bladder issues: none, had episode of bowel urgency a few days ago. No perineal numbness.   Surgery: no spinal surgeries  The symptoms are causing a significant impact on the patient's life.   Review of Systems:  A 10 point review of systems is negative, except for the pertinent positives and negatives detailed in the HPI.  Past Medical History: Past Medical History:  Diagnosis Date   AAA (abdominal aortic aneurysm) (HCC)    Adenomatous polyp    Anxiety    Carpal tunnel syndrome on left    Cervical spine disease    Colitis    Coronary artery disease    S/p anterior MI tx with PCI in 1998  //  Myoview 6/22: EF 45, ant and septal scar; no ischemia; intermediate risk // Echocardiogram 7/22: EF 45-50, ant-sept and apical HK, Gr 1 DD, normal RVSF, mild MR   Deviated septum    Hyperlipidemia    Hypertension    Renal Artery [place] 12/2022: no RAS bilaterally   Ischemic cardiomyopathy 05/19/2022   Echocardiogram 06/02/22: EF  45-50, global HK, Gr 1 DD, NL RVSF, NL PASP (RVSP 26.4), trivial MR, RAP 3   Low back pain    Myocardial infarction (HCC) 1998   Personal history of pulmonary embolism    Pulmonary embolism (HCC) 08/26/2022   Sleep apnea    CPAP   Wears dentures    full upper    Past Surgical History: Past Surgical History:  Procedure Laterality Date   APPENDECTOMY     AQUEOUS SHUNT Right 10/26/2022   Procedure: AHMED TUBE SHUNT WITH TUTOPLAST RIGHT;  Surgeon: Nevada Crane, MD;  Location: [facility];  Service: Ophthalmology;  Laterality: Right;   bunions     CATARACT EXTRACTION     COLONOSCOPY WITH PROPOFOL N/A 02/19/2015   Procedure: COLONOSCOPY WITH PROPOFOL;  Surgeon: Midge Minium, MD;  Location: [facility];  Service: Endoscopy;  Laterality: N/A;   COLONOSCOPY WITH PROPOFOL N/A 06/05/2019   Procedure: COLONOSCOPY WITH BIOPSY;  Surgeon: Midge Minium, MD;  Location: [facility];  Service: Endoscopy;  Laterality: N/A;  sleep apnea   ERCP N/A 08/25/2022   Procedure: ENDOSCOPIC RETROGRADE CHOLANGIOPANCREATOGRAPHY (ERCP);  Surgeon: Midge Minium, MD;  Location: [facility];  Service: Endoscopy;  Laterality: N/A;   HEMORRHOID BANDING     HERNIA REPAIR     x3   OLECRANON BURSECTOMY Right    POLYPECTOMY N/A  06/05/2019   Procedure: POLYPECTOMY;  Surgeon: Midge Minium, MD;  Location: [facility];  Service: Endoscopy;  Laterality: N/A;    Allergies: Allergies as of 09/28/2023   (No Known Allergies)    Medications: Outpatient Encounter Medications as of 09/28/2023  Medication Sig   albuterol (VENTOLIN HFA) 108 (90 Base) MCG/ACT inhaler Inhale 2 puffs into the lungs every 6 (six) hours as needed for wheezing or shortness of breath.   alendronate (FOSAMAX) 70 MG tablet Take 1 tablet (70 mg total) by mouth every 7 (seven) days. Take with a full glass of water on an empty stomach.   amLODipine (NORVASC) 10 MG tablet Take 1 tablet (10 mg total) by mouth daily.   aspirin  81 MG tablet Take 81 mg by mouth at bedtime.   atorvastatin (LIPITOR) 40 MG tablet TAKE 1 TABLET BY MOUTH AT  BEDTIME   baclofen (LIORESAL) 10 MG tablet TAKE 1 TABLET(10 MG) BY MOUTH THREE TIMES DAILY AS NEEDED FOR MUSCLE SPASMS   Cholecalciferol (VITAMIN D3) 50 MCG (2000 UT) CAPS Take by mouth daily.   Coenzyme Q10 (COQ10) 100 MG CAPS Take 1 capsule by mouth daily.     furosemide (LASIX) 20 MG tablet Take 1 tablet (20 mg) by mouth once daily as needed for swelling   meclizine (ANTIVERT) 25 MG tablet Take 1 tablet (25 mg total) by mouth 2 (two) times daily as needed for dizziness.   metroNIDAZOLE (FLAGYL) 500 MG tablet Take 1 tablet (500 mg total) by mouth 2 (two) times daily.   Multiple Vitamin (MULTIVITAMIN PO) Take 1 tablet by mouth daily.   nitroGLYCERIN (NITROSTAT) 0.4 MG SL tablet Place 1 tablet (0.4 mg total) under the tongue every 5 (five) minutes as needed for chest pain.   Oxycodone HCl 10 MG TABS Take 1-2 tablets (10-20 mg total) by mouth every 6 (six) hours as needed (moderate to severe pain).   predniSONE (DELTASONE) 20 MG tablet Take 1 tablet (20 mg total) by mouth daily with breakfast.   saccharomyces boulardii (FLORASTOR) 250 MG capsule Take 250 mg by mouth 2 (two) times daily.   hydrALAZINE (APRESOLINE) 25 MG tablet Take 1 tablet (25 mg total) by mouth 3 (three) times daily. Take as needed for blood pressure greater then 140.   Facility-Administered Encounter Medications as of 09/28/2023  Medication   albuterol (PROVENTIL) (2.5 MG/3ML) 0.083% nebulizer solution 2.5 mg    Social History: Social History   Tobacco Use   Smoking status: Every Day    Current packs/day: 0.50    Average packs/day: 0.5 packs/day for 65.2 years (32.6 ttl pk-yrs)    Types: Cigarettes    Start date: 1960   Smokeless tobacco: Never   Tobacco comments:    1/2 3/4 ppd (since age 50)  Vaping Use   Vaping status: Never Used  Substance Use Topics   Alcohol use: Not Currently    Comment:  occasionally, "none   Drug use: No    Family Medical History: Family History  Problem Relation Age of Onset   Heart failure Mother    Kidney disease Father    Hypertension Father    Coronary artery disease Brother    Alzheimer's disease Maternal Grandmother    Colon cancer Maternal Grandfather     Physical Examination: Vitals:   09/28/23 1009  BP: 128/86    General: Patient is well developed, well nourished, calm, collected, and in no apparent distress. Attention to examination is appropriate.  Respiratory: Patient is breathing without any  difficulty.   NEUROLOGICAL:     Awake, alert, oriented to person, place, and time.  Speech is clear and fluent. Fund of knowledge is appropriate.   Cranial Nerves: Pupils equal round and reactive to light.  Facial tone is symmetric.    No TL tenderness.   No abnormal lesions on exposed skin.   Strength: Side Biceps Triceps Deltoid Interossei Grip Wrist Ext. Wrist Flex.  R 5 5 5 5 5 5 5   L 5 5 5 5 5 5 5    Side Iliopsoas Quads Hamstring PF DF EHL  R 5 5 5 5 3 3   L 5 5 5 5 5 5    Right foot drop is chronic per patient.   Reflexes are 2+ and symmetric at the biceps, brachioradialis, patella and achilles, but right achilles is diminished.   Hoffman's is absent.  Clonus is not present.   Bilateral upper and lower extremity sensation is intact to light touch, slightly diminished in both legs compared to his arms.   No pain with IR/ER of both hips.   Gait is slow.   Medical Decision Making  Imaging: Xrays of lumbar spine dated 09/28/23:  T12 compression fracture that appears stable in comparison to previous lumbar xrays.   Report not available yet for above xrays.    MRI of lumbar spine dated 09/15/23:  FINDINGS: Segmentation:  Standard.   Alignment: Prominent dextrocurvature centered at the L3 level. Mild retrolisthesis at L3-4.   Vertebrae: Acute inferior endplate compression fracture of the T12 vertebral body with  approximately 40% vertebral body height loss. Bone marrow edema at the fracture site. Minimal retropulsion at the inferior endplate. Bone marrow edema within the remaining lumbar vertebral bodies is favored to be discogenic. No additional acute fractures are identified. No evidence of discitis. No marrow replacing bone lesion.   Conus medullaris and cauda equina: Conus extends to the L1 level. Conus and cauda equina appear normal.   Paraspinal and other soft tissues: Negative.   Disc levels:   T12-L1: Mild inferior endplate retropulsion. No significant foraminal or canal stenosis.   L1-L2: Disc bulge and endplate spurring with facet hypertrophy. No significant canal stenosis. Mild bilateral subarticular recess stenosis. Mild right foraminal stenosis.   L2-L3: Disc bulge and endplate spurring with left greater than right facet arthropathy. Mild canal stenosis with left-sided subarticular recess stenosis. Moderate left foraminal stenosis.   L3-L4: Disc bulge and endplate spurring with bilateral facet arthropathy. No significant canal stenosis. Mild left subarticular recess stenosis. Moderate-severe left and mild right foraminal stenosis.   L4-L5: Disc bulge and endplate spurring with bilateral facet arthropathy. Bilateral subarticular recess stenosis without significant canal stenosis. Severe bilateral foraminal stenosis, right worse than left.   L5-S1: Annular disc bulge with right worse than left facet arthropathy. Mild right subarticular recess stenosis without canal stenosis. Moderate-severe right and borderline-mild left foraminal stenosis.   IMPRESSION: 1. Acute inferior endplate compression fracture of the T12 vertebral body with approximately 40% vertebral body height loss. 2. Advanced multilevel degenerative changes of the lumbar spine with mild canal stenosis at L2-3. 3. Multilevel foraminal stenosis, severe bilaterally at L4-5 and moderate-severe on the left at  L3-4 and on the right at L5-S1.   These results will be called to the ordering clinician or representative by the Radiologist Assistant, and communication documented in the PACS or [employer].     Electronically Signed   By: Duanne Guess D.O.   On: 09/15/2023 18:28   I have personally reviewed  the images and agree with the above interpretation.  Imaging reviewed with Dr. Myer Haff.   Assessment and Plan: Mr. Duval has a 3 week history of mid to lower back pain that is constant, worse on left side. No leg pain. No pain radiating to ribs. Pain is slowly improving.   History of right drop foot years ago and he still has weakness on exam today.   He has known T12 compression fracture that appears stable on his xrays from today.   He also has known underlying lumbar scoliosis, spondylosis, and multilevel foraminal stenosis.   Current pain appears to be from T12 compression fracture.   Treatment options discussed with patient and following plan made:   - TLSO brace ordered from [facility]. He does not need to wear to sleep. Should wear when up and walking. No bending, twisting, or lifting.  - Discussed fracture should heal over next 2 months.  - Follow up with me in 4 weeks with repeat xrays.  - Would hold on PT for now until fracture is healed.   I spent a total of 45 minutes in face-to-face and non-face-to-face activities related to this patient's care today including review of outside records, review of imaging, review of symptoms, physical exam, discussion of differential diagnosis, discussion of treatment options, and documentation.   Thank you for involving me in the care of this patient.   Drake Leach PA-C Dept. of Neurosurgery

## 2023-09-28 ENCOUNTER — Encounter: Payer: Self-pay | Admitting: Orthopedic Surgery

## 2023-09-28 ENCOUNTER — Telehealth: Payer: Self-pay | Admitting: Orthopedic Surgery

## 2023-09-28 ENCOUNTER — Ambulatory Visit: Admitting: Orthopedic Surgery

## 2023-09-28 VITALS — BP 128/86 | Ht 70.0 in | Wt 160.0 lb

## 2023-09-28 DIAGNOSIS — S22080A Wedge compression fracture of T11-T12 vertebra, initial encounter for closed fracture: Secondary | ICD-10-CM | POA: Diagnosis not present

## 2023-09-28 DIAGNOSIS — M48061 Spinal stenosis, lumbar region without neurogenic claudication: Secondary | ICD-10-CM | POA: Diagnosis not present

## 2023-09-28 DIAGNOSIS — M419 Scoliosis, unspecified: Secondary | ICD-10-CM

## 2023-09-28 DIAGNOSIS — M47816 Spondylosis without myelopathy or radiculopathy, lumbar region: Secondary | ICD-10-CM

## 2023-09-28 NOTE — Patient Instructions (Signed)
 It was so nice to see you today. Thank you so much for coming in.    You have a broken bone (compression fracture) at T12. This should heal over the next 2 months.   I will put an order in for a TLSO brace with [facility]. They should call you.   You do not need to wear brace to sleep. Can remove when sitting and watching TV. You need to wear brace when you are up and walking.   No bending, twisting, or lifting.   I will see you back in 4 weeks. Please get xrays prior to that visit (like you did today). Please do not hesitate to call if you have any questions or concerns. You can also message me in MyChart.   Drake Leach PA-C [phone]     The physicians and staff at [facility] Neurosurgery at [facility] are committed to providing excellent care. You may receive a survey asking for feedback about your experience at our office. We value you your feedback and appreciate you taking the time to to fill it out. The [facility] leadership team is also available to discuss your experience in person, feel free to contact us [phone].

## 2023-09-28 NOTE — Telephone Encounter (Signed)
 Patient is calling to report a pain in the left lower side of his ribs. He states that it is a consistent dull ache with 2/10 pain and any time he moves or presses against it the pain jumps up to a 6/10. He wanted to let Kennyth Arnold know that this was going on and what he needs to do.

## 2023-09-28 NOTE — Telephone Encounter (Signed)
 Patient called again to follow up on his telephone call from earlier. Spoke with APP, appointment scheduled for Monday, 1 hour time slot.

## 2023-09-28 NOTE — Telephone Encounter (Signed)
 He made an appointment with me next week and I am happy to see him, but can we call him and find out some additional info? He may not need to come in. He was not having this pain when I saw him this morning.   Sounds like he is having some pain in left side of his ribs. Is it constant? Any numbness or tingling?   Please let me know.

## 2023-09-29 ENCOUNTER — Telehealth: Payer: Self-pay | Admitting: Orthopedic Surgery

## 2023-09-29 NOTE — Progress Notes (Signed)
Order has been faxed to [facility] 

## 2023-09-29 NOTE — Telephone Encounter (Signed)
 Spoke with patient. He is having some left sided rib pain in mid back. Told him this was likely muscular in nature and due to compression fracture.   He would like to be seen.   Can you please move his appt from Monday to this Friday at 9:30? Please keep 60 minutes. He is aware to come in on Friday.   Thanks!

## 2023-09-29 NOTE — Telephone Encounter (Signed)
 His appt has been moved to Friday at 9:30am.

## 2023-09-30 NOTE — Progress Notes (Addendum)
 Referring Physician:  No referring provider defined for this encounter.  Primary Physician:  Erasmo Downer, MD  History of Present Illness: [name] has a history of ischemic bowel disease, MI, HTN, CAD, ischemic cardiomyopathy, history of PE, sleep apnea, hyperlipidemia, chronic pain syndrome, skin CA.   Last seen by me on 09/28/23 for T12 compression fracture that likely occurred around 09/08/22. TLSO brace was ordered.   When he got home from his visit with me, he was having some left sided rib pain/tenderness. I spoke with him on the phone and discussed this was likely muscular in nature. He wanted to be see for further evaluation.   He has intermittent left sided rib pain that is worse with turning, moving, bending. Pain does not wrap around to his chest. Pain can be sharp in nature. No new numbness, tingling, or weakness. No new arm or leg pain. He notes some numbness in both legs. He has a history of right drop foot years ago and he still has weakness.   [facility] called his yesterday and he needs to call them back- does not have TLSO brace yet.   He has been on oxycodone long term from his PCP. He is taking prn baclofen from PCP as well.   He is caregiver for his wife.   He smokes 1/2 PPD x 60+ years. He quit last week.   Bowel or bladder issues: none  Surgery: no spinal surgeries  The symptoms are causing a significant impact on the patient's life.   Review of Systems:  A 10 point review of systems is negative, except for the pertinent positives and negatives detailed in the HPI.  Past Medical History: Past Medical History:  Diagnosis Date   AAA (abdominal aortic aneurysm) (HCC)    Adenomatous polyp    Anxiety    Carpal tunnel syndrome on left    Cervical spine disease    Colitis    Coronary artery disease    S/p anterior MI tx with PCI in 1998  //  Myoview 6/22: EF 45, ant and septal scar; no ischemia; intermediate risk // Echocardiogram 7/22: EF  45-50, ant-sept and apical HK, Gr 1 DD, normal RVSF, mild MR   Deviated septum    Hyperlipidemia    Hypertension    Renal Artery [place] 12/2022: no RAS bilaterally   Ischemic cardiomyopathy 05/19/2022   Echocardiogram 06/02/22: EF 45-50, global HK, Gr 1 DD, NL RVSF, NL PASP (RVSP 26.4), trivial MR, RAP 3   Low back pain    Myocardial infarction (HCC) 1998   Personal history of pulmonary embolism    Pulmonary embolism (HCC) 08/26/2022   Sleep apnea    CPAP   Wears dentures    full upper    Past Surgical History: Past Surgical History:  Procedure Laterality Date   APPENDECTOMY     AQUEOUS SHUNT Right 10/26/2022   Procedure: AHMED TUBE SHUNT WITH TUTOPLAST RIGHT;  Surgeon: Nevada Crane, MD;  Location: [facility];  Service: Ophthalmology;  Laterality: Right;   bunions     CATARACT EXTRACTION     COLONOSCOPY WITH PROPOFOL N/A 02/19/2015   Procedure: COLONOSCOPY WITH PROPOFOL;  Surgeon: Midge Minium, MD;  Location: [facility];  Service: Endoscopy;  Laterality: N/A;   COLONOSCOPY WITH PROPOFOL N/A 06/05/2019   Procedure: COLONOSCOPY WITH BIOPSY;  Surgeon: Midge Minium, MD;  Location: [facility];  Service: Endoscopy;  Laterality: N/A;  sleep apnea   ERCP N/A 08/25/2022   Procedure:  ENDOSCOPIC RETROGRADE CHOLANGIOPANCREATOGRAPHY (ERCP);  Surgeon: Midge Minium, MD;  Location: [facility];  Service: Endoscopy;  Laterality: N/A;   HEMORRHOID BANDING     HERNIA REPAIR     x3   OLECRANON BURSECTOMY Right    POLYPECTOMY N/A 06/05/2019   Procedure: POLYPECTOMY;  Surgeon: Midge Minium, MD;  Location: [facility];  Service: Endoscopy;  Laterality: N/A;    Allergies: Allergies as of 10/01/2023   (No Known Allergies)    Medications: Outpatient Encounter Medications as of 10/01/2023  Medication Sig   albuterol (VENTOLIN HFA) 108 (90 Base) MCG/ACT inhaler Inhale 2 puffs into the lungs every 6 (six) hours as needed for wheezing or shortness of breath.    alendronate (FOSAMAX) 70 MG tablet Take 1 tablet (70 mg total) by mouth every 7 (seven) days. Take with a full glass of water on an empty stomach.   amLODipine (NORVASC) 10 MG tablet Take 1 tablet (10 mg total) by mouth daily.   aspirin 81 MG tablet Take 81 mg by mouth at bedtime.   atorvastatin (LIPITOR) 40 MG tablet TAKE 1 TABLET BY MOUTH AT  BEDTIME   baclofen (LIORESAL) 10 MG tablet TAKE 1 TABLET(10 MG) BY MOUTH THREE TIMES DAILY AS NEEDED FOR MUSCLE SPASMS   Cholecalciferol (VITAMIN D3) 50 MCG (2000 UT) CAPS Take by mouth daily.   Coenzyme Q10 (COQ10) 100 MG CAPS Take 1 capsule by mouth daily.     furosemide (LASIX) 20 MG tablet Take 1 tablet (20 mg) by mouth once daily as needed for swelling   hydrALAZINE (APRESOLINE) 25 MG tablet Take 1 tablet (25 mg total) by mouth 3 (three) times daily. Take as needed for blood pressure greater then 140.   meclizine (ANTIVERT) 25 MG tablet Take 1 tablet (25 mg total) by mouth 2 (two) times daily as needed for dizziness.   metroNIDAZOLE (FLAGYL) 500 MG tablet Take 1 tablet (500 mg total) by mouth 2 (two) times daily.   Multiple Vitamin (MULTIVITAMIN PO) Take 1 tablet by mouth daily.   nitroGLYCERIN (NITROSTAT) 0.4 MG SL tablet Place 1 tablet (0.4 mg total) under the tongue every 5 (five) minutes as needed for chest pain.   Oxycodone HCl 10 MG TABS Take 1-2 tablets (10-20 mg total) by mouth every 6 (six) hours as needed (moderate to severe pain).   predniSONE (DELTASONE) 20 MG tablet Take 1 tablet (20 mg total) by mouth daily with breakfast.   saccharomyces boulardii (FLORASTOR) 250 MG capsule Take 250 mg by mouth 2 (two) times daily.   Facility-Administered Encounter Medications as of 10/01/2023  Medication   albuterol (PROVENTIL) (2.5 MG/3ML) 0.083% nebulizer solution 2.5 mg    Social History: Social History   Tobacco Use   Smoking status: Every Day    Current packs/day: 0.50    Average packs/day: 0.5 packs/day for 65.2 years (32.6 ttl pk-yrs)     Types: Cigarettes    Start date: 1960   Smokeless tobacco: Never   Tobacco comments:    1/2 3/4 ppd (since age 39)  Vaping Use   Vaping status: Never Used  Substance Use Topics   Alcohol use: Not Currently    Comment: occasionally, "none   Drug use: No    Family Medical History: Family History  Problem Relation Age of Onset   Heart failure Mother    Kidney disease Father    Hypertension Father    Coronary artery disease Brother    Alzheimer's disease Maternal Grandmother    Colon cancer Maternal  Grandfather     Physical Examination: There were no vitals filed for this visit.    Awake, alert, oriented to person, place, and time.  Speech is clear and fluent. Fund of knowledge is appropriate.   Cranial Nerves: Pupils equal round and reactive to light.  Facial tone is symmetric.    No TL tenderness midline.   He has some left sided tenderness in lower rib area.   No abnormal lesions on exposed skin.   Strength: Side Biceps Triceps Deltoid Interossei Grip Wrist Ext. Wrist Flex.  R 5 5 5 5 5 5 5   L 5 5 5 5 5 5 5    Side Iliopsoas Quads Hamstring PF DF EHL  R 5 5 5 5 3 3   L 5 5 5 5 5 5    Right foot drop is chronic per patient.   Reflexes are 2+ and symmetric at the biceps, brachioradialis, patella and achilles, but right achilles is diminished.    Bilateral upper and lower extremity sensation is intact to light touch, slightly diminished in both legs compared to his arms.   Gait is slow.   Medical Decision Making  Imaging: None    Assessment and Plan: Mr. Erny has known T12 compression fracture that likely happened 3-4 weeks ago.   He has intermittent left sided rib pain that is worse with turning, moving, bending. Pain does not wrap around to his chest. Pain can be sharp in nature. No new numbness, tingling, or weakness. No new arm or leg pain.   History of right drop foot years ago and he still has weakness on exam today.   He also has known underlying  lumbar scoliosis, spondylosis, and multilevel foraminal stenosis.   Current pain appears to be from T12 compression fracture, left sided rib pain is likely more muscular in nature.   Treatment options discussed with patient and following plan made:   - He will call [facility] back to get TLSO brace. I think this will help with his current pain.  - He asks about possible rib fracture, I don't appreciate this on his xrays from earlier this week, but will have radiology read them and message him with results.  - Continue on oxycodone and baclofen from PCP.  - Briefly discussed kyphoplasty if no pain gets worse, but I think his pain will improve once he gets brace.  - He will follow up with me as scheduled in 4 weeks with repeat xrays.   ADDENDUM 10/01/23:  Lumbar xrays dated 09/27/23:  FINDINGS: On recent 09/15/2023 MRI, there is an acute inferior endplate compression fracture of the T12 vertebral body with approximately 40% height loss. On the current radiographs, similar 40% height loss is seen within the inferior right greater than left T12 vertebral body, and this appears mildly worsened from 09/07/2023 radiographs.   Moderate dextrocurvature centered at L3 with Cobb angle measuring 28 degrees. There is approximately 9 mm left lateral listhesis of L2 on L3. Severe left L2-3 and L3-4 disc space narrowing with large left L3-4 bridging osteophytes. Moderate right L1-2 disc space narrowing.   Chronic moderate left L3 vertebral body height loss is unchanged.   The T10 vertebral body is normal in height.   High-grade left L2-3 endplate sclerosis and high-grade L5-S1 greater than L4-5 facet joint hypertrophy and sclerosis.   There is high-grade atherosclerotic calcification within the abdominal aorta and iliac arteries. Ectasia of the abdominal aorta anterior to the L4 vertebral body appears similar to 08/26/2022 CT.  IMPRESSION: 1. Acute to subacute inferior endplate compression fracture  of the T12 vertebral body with approximately 40% height loss, grossly similar to 09/15/2023 MRI but appearing mildly worsened from 09/07/2023 radiographs. 2. Moderate dextrocurvature centered at L3 with Cobb angle measuring 28 degrees. 3. Severe left L2-3 and L3-4 degenerative disc and endplate changes. 4. High-grade L5-S1 greater than L4-5 facet joint osteoarthritis.     Electronically Signed   By: Neita Garnet M.D.   On: 10/01/2023 10:10  I have personally reviewed the images and agree with the above interpretation.  No change to above plan. Patient sent message.   I spent a total of 20 minutes in face-to-face and non-face-to-face activities related to this patient's care today including review of outside records, review of imaging, review of symptoms, physical exam, discussion of differential diagnosis, discussion of treatment options, and documentation.   Drake Leach PA-C Dept. of Neurosurgery

## 2023-10-01 ENCOUNTER — Encounter: Payer: Self-pay | Admitting: Orthopedic Surgery

## 2023-10-01 ENCOUNTER — Ambulatory Visit: Admitting: Orthopedic Surgery

## 2023-10-01 VITALS — BP 128/82 | Ht 70.0 in | Wt 160.0 lb

## 2023-10-01 DIAGNOSIS — S22080D Wedge compression fracture of T11-T12 vertebra, subsequent encounter for fracture with routine healing: Secondary | ICD-10-CM

## 2023-10-04 ENCOUNTER — Ambulatory Visit: Admitting: Orthopedic Surgery

## 2023-10-05 ENCOUNTER — Encounter: Payer: Self-pay | Admitting: Physician Assistant

## 2023-10-06 ENCOUNTER — Telehealth: Payer: Self-pay

## 2023-10-06 ENCOUNTER — Ambulatory Visit: Payer: Medicare Other | Admitting: Dermatology

## 2023-10-06 ENCOUNTER — Encounter: Payer: Self-pay | Admitting: Dermatology

## 2023-10-06 DIAGNOSIS — L814 Other melanin hyperpigmentation: Secondary | ICD-10-CM

## 2023-10-06 DIAGNOSIS — R600 Localized edema: Secondary | ICD-10-CM | POA: Diagnosis not present

## 2023-10-06 DIAGNOSIS — M25471 Effusion, right ankle: Secondary | ICD-10-CM

## 2023-10-06 DIAGNOSIS — Z7189 Other specified counseling: Secondary | ICD-10-CM

## 2023-10-06 DIAGNOSIS — L817 Pigmented purpuric dermatosis: Secondary | ICD-10-CM

## 2023-10-06 DIAGNOSIS — W908XXA Exposure to other nonionizing radiation, initial encounter: Secondary | ICD-10-CM | POA: Diagnosis not present

## 2023-10-06 DIAGNOSIS — D229 Melanocytic nevi, unspecified: Secondary | ICD-10-CM

## 2023-10-06 DIAGNOSIS — Z8589 Personal history of malignant neoplasm of other organs and systems: Secondary | ICD-10-CM

## 2023-10-06 DIAGNOSIS — Z1283 Encounter for screening for malignant neoplasm of skin: Secondary | ICD-10-CM

## 2023-10-06 DIAGNOSIS — I872 Venous insufficiency (chronic) (peripheral): Secondary | ICD-10-CM | POA: Diagnosis not present

## 2023-10-06 DIAGNOSIS — D1801 Hemangioma of skin and subcutaneous tissue: Secondary | ICD-10-CM

## 2023-10-06 DIAGNOSIS — H814 Vertigo of central origin: Secondary | ICD-10-CM | POA: Diagnosis not present

## 2023-10-06 DIAGNOSIS — L821 Other seborrheic keratosis: Secondary | ICD-10-CM

## 2023-10-06 DIAGNOSIS — M25641 Stiffness of right hand, not elsewhere classified: Secondary | ICD-10-CM | POA: Diagnosis not present

## 2023-10-06 DIAGNOSIS — Z85828 Personal history of other malignant neoplasm of skin: Secondary | ICD-10-CM | POA: Diagnosis not present

## 2023-10-06 DIAGNOSIS — L578 Other skin changes due to chronic exposure to nonionizing radiation: Secondary | ICD-10-CM | POA: Diagnosis not present

## 2023-10-06 DIAGNOSIS — D692 Other nonthrombocytopenic purpura: Secondary | ICD-10-CM | POA: Diagnosis not present

## 2023-10-06 DIAGNOSIS — R2681 Unsteadiness on feet: Secondary | ICD-10-CM | POA: Diagnosis not present

## 2023-10-06 DIAGNOSIS — R531 Weakness: Secondary | ICD-10-CM | POA: Diagnosis not present

## 2023-10-06 NOTE — Progress Notes (Signed)
 New Patient Visit   Subjective  Brian [name] is a 83 y.o. male who presents for the following: Skin Cancer Screening and Full Body Skin Exam Hx of scc was seen doctor in [place] On left arm years ago.  The patient presents for Total-Body Skin Exam (TBSE) for skin cancer screening and mole check. The patient has spots, moles and lesions to be evaluated, some may be new or changing and the patient may have concern these could be cancer.  The following portions of the chart were reviewed this encounter and updated as appropriate: medications, allergies, medical history  Review of Systems:  No other skin or systemic complaints except as noted in HPI or Assessment and Plan.  Objective  Well appearing patient in no apparent distress; mood and affect are within normal limits.  A full examination was performed including scalp, head, eyes, ears, nose, lips, neck, chest, axillae, abdomen, back, buttocks, bilateral upper extremities, bilateral lower extremities, hands, feet, fingers, toes, fingernails, and toenails. All findings within normal limits unless otherwise noted below.   Relevant physical exam findings are noted in the Assessment and Plan.   Assessment & Plan   SKIN CANCER SCREENING PERFORMED TODAY.  ACTINIC DAMAGE - Chronic condition, secondary to cumulative UV/sun exposure - diffuse scaly erythematous macules with underlying dyspigmentation - Recommend daily broad spectrum sunscreen SPF 30+ to sun-exposed areas, reapply every 2 hours as needed.  - Staying in the shade or wearing long sleeves, sun glasses (UVA+UVB protection) and wide brim hats (4-inch brim around the entire circumference of the hat) are also recommended for sun protection.  - Call for new or changing lesions.  LENTIGINES, SEBORRHEIC KERATOSES, HEMANGIOMAS - Benign normal skin lesions - Benign-appearing - Call for any changes  MELANOCYTIC NEVI - Tan-brown and/or pink-flesh-colored symmetric macules and  papules - Benign appearing on exam today - Observation - Call clinic for new or changing moles - Recommend daily use of broad spectrum spf 30+ sunscreen to sun-exposed areas.   HISTORY OF SQUAMOUS CELL CARCINOMA OF THE SKIN Left forearm near scar done at [facility] - patient will sign record release form today -  - No evidence of recurrence today - No lymphadenopathy - Recommend regular full body skin exams - Recommend daily broad spectrum sunscreen SPF 30+ to sun-exposed areas, reapply every 2 hours as needed.  - Call if any new or changing lesions are noted between office visits  Varicose Veins/Spider Veins - Dilated blue, purple or red veins at the lower extremities - Reassured - Smaller vessels can be treated by sclerotherapy (a procedure to inject a medicine into the veins to make them disappear) if desired, but the treatment is not covered by insurance. Larger vessels may be covered if symptomatic and we would refer to vascular surgeon if treatment desired.  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 Chronic and persistent condition with duration or expected duration over one year. Condition is symptomatic / bothersome to patient. Not to goal. Stasis in the legs causes chronic leg swelling, which may result in itchy or painful rashes, skin discoloration, skin texture changes, and sometimes ulceration.  Recommend daily graduated compression hose/stockings- easiest to put on first thing in morning, remove at bedtime.  Elevate legs as much as possible. Avoid salt/sodium rich foods. Treatment Plan: Followup  with pcp  Recommend graduated knee-high compression stockings  Patient has been seen by [facility] in the recent past.  Purpura - Chronic; persistent  and recurrent.  Treatable, but not curable. - Violaceous macules and patches - Benign - Related to trauma, age, sun damage and/or use of blood thinners, chronic use of topical and/or oral steroids - Observe - Can use OTC arnica containing moisturizer such as Dermend Bruise Formula if desired - Call for worsening or other concerns   Return in about 1 year (around 10/05/2024) for TBSE.  IAsher Muir, CMA, am acting as scribe for Armida Sans, MD.   Documentation: I have reviewed the above documentation for accuracy and completeness, and I agree with the above.  Armida Sans, MD

## 2023-10-06 NOTE — Telephone Encounter (Signed)
 Copied from CRM [id number]. Topic: General - Other >> Oct 05, 2023 10:21 AM Truddie Crumble wrote: Reason for CRM: patient called wanting to see if his doctor was available. I let the patient know that the office told me his doctor will be out until April. Patient want to talk to the nurse and I asked if he wanted me to tell them what it was about and he disconnected the call. Patient stated it was about his wife to that is a patient of hers

## 2023-10-06 NOTE — Patient Instructions (Addendum)
 Stasis in the legs causes chronic leg swelling, which may result in itchy or painful rashes, skin discoloration, skin texture changes, and sometimes ulceration.  Recommend daily graduated compression hose/stockings- easiest to put on first thing in morning, remove at bedtime.  Elevate legs as much as possible. Avoid salt/sodium rich foods.      Seborrheic Keratosis  What causes seborrheic keratoses? Seborrheic keratoses are harmless, common skin growths that first appear during adult life.  As time goes by, more growths appear.  Some people may develop a large number of them.  Seborrheic keratoses appear on both covered and uncovered body parts.  They are not caused by sunlight.  The tendency to develop seborrheic keratoses can be inherited.  They vary in color from skin-colored to gray, brown, or even black.  They can be either smooth or have a rough, warty surface.   Seborrheic keratoses are superficial and look as if they were stuck on the skin.  Under the microscope this type of keratosis looks like layers upon layers of skin.  That is why at times the top layer may seem to fall off, but the rest of the growth remains and re-grows.    Treatment Seborrheic keratoses do not need to be treated, but can easily be removed in the office.  Seborrheic keratoses often cause symptoms when they rub on clothing or jewelry.  Lesions can be in the way of shaving.  If they become inflamed, they can cause itching, soreness, or burning.  Removal of a seborrheic keratosis can be accomplished by freezing, burning, or surgery. If any spot bleeds, scabs, or grows rapidly, please return to have it checked, as these can be an indication of a skin cancer.5   Melanoma ABCDEs  Melanoma is the most dangerous type of skin cancer, and is the leading cause of death from skin disease.  You are more likely to develop melanoma if you: Have light-colored skin, light-colored eyes, or red or blond hair Spend a lot of time in  the sun Tan regularly, either outdoors or in a tanning bed Have had blistering sunburns, especially during childhood Have a close family member who has had a melanoma Have atypical moles or large birthmarks  Early detection of melanoma is key since treatment is typically straightforward and cure rates are extremely high if we catch it early.   The first sign of melanoma is often a change in a mole or a new dark spot.  The ABCDE system is a way of remembering the signs of melanoma.  A for asymmetry:  The two halves do not match. B for border:  The edges of the growth are irregular. C for color:  A mixture of colors are present instead of an even brown color. D for diameter:  Melanomas are usually (but not always) greater than 6mm - the size of a pencil eraser. E for evolution:  The spot keeps changing in size, shape, and color.  Please check your skin once per month between visits. You can use a small mirror in front and a large mirror behind you to keep an eye on the back side or your body.   If you see any new or changing lesions before your next follow-up, please call to schedule a visit.  Please continue daily skin protection including broad spectrum sunscreen SPF 30+ to sun-exposed areas, reapplying every 2 hours as needed when you're outdoors.   Staying in the shade or wearing long sleeves, sun glasses (UVA+UVB protection) and wide  brim hats (4-inch brim around the entire circumference of the hat) are also recommended for sun protection.    Due to recent changes in healthcare laws, you may see results of your pathology and/or laboratory studies on MyChart before the doctors have had a chance to review them. We understand that in some cases there may be results that are confusing or concerning to you. Please understand that not all results are received at the same time and often the doctors may need to interpret multiple results in order to provide you with the best plan of care or course  of treatment. Therefore, we ask that you please give [place] 2 business days to thoroughly review all your results before contacting the office for clarification. Should we see a critical lab result, you will be contacted sooner.   If You Need Anything After Your Visit  If you have any questions or concerns for your doctor, please call our main line at [phone] and press option 4 to reach your doctor's medical assistant. If no one answers, please leave a voicemail as directed and we will return your call as soon as possible. Messages left after 4 pm will be answered the following business day.   You may also send [place] a message via MyChart. We typically respond to MyChart messages within 1-2 business days.  For prescription refills, please ask your pharmacy to contact our office. Our fax number is [phone].  If you have an urgent issue when the clinic is closed that cannot wait until the next business day, you can page your doctor at the number below.    Please note that while we do our best to be available for urgent issues outside of office hours, we are not available 24/7.   If you have an urgent issue and are unable to reach [place], you may choose to seek medical care at your doctor's office, retail clinic, urgent care center, or emergency room.  If you have a medical emergency, please immediately call 911 or go to the emergency department.  Pager Numbers  - Dr. Gwen Pounds: [phone]  - Dr. Roseanne Reno: [phone]  - Dr. Katrinka Blazing: [phone]   In the event of inclement weather, please call our main line at [phone] for an update on the status of any delays or closures.  Dermatology Medication Tips: Please keep the boxes that topical medications come in in order to help keep track of the instructions about where and how to use these. Pharmacies typically print the medication instructions only on the boxes and not directly on the medication tubes.   If your medication is too  expensive, please contact our office at [phone] option 4 or send [place] a message through MyChart.   We are unable to tell what your co-pay for medications will be in advance as this is different depending on your insurance coverage. However, we may be able to find a substitute medication at lower cost or fill out paperwork to get insurance to cover a needed medication.   If a prior authorization is required to get your medication covered by your insurance company, please allow [place] 1-2 business days to complete this process.  Drug prices often vary depending on where the prescription is filled and some pharmacies may offer cheaper prices.  The website www.goodrx.com contains coupons for medications through different pharmacies. The prices here do not account for what the cost may be with help from insurance (it may be cheaper with your insurance), but the website can give  you the price if you did not use any insurance.  - You can print the associated coupon and take it with your prescription to the pharmacy.  - You may also stop by our office during regular business hours and pick up a GoodRx coupon card.  - If you need your prescription sent electronically to a different pharmacy, notify our office through [facility] or by phone at [phone] option 4.     Si Usted Necesita Algo Despus de Su Visita  Tambin puede enviarnos un mensaje a travs de Clinical cytogeneticist. Por lo general respondemos a los mensajes de MyChart en el transcurso de 1 a 2 das hbiles.  Para renovar recetas, por favor pida a su farmacia que se ponga en contacto con nuestra oficina. Annie Sable de fax es [place] [phone].  Si tiene un asunto urgente cuando la clnica est cerrada y que no puede esperar hasta el siguiente da hbil, puede llamar/localizar a su doctor(a) al nmero que aparece a continuacin.   Por favor, tenga en cuenta que aunque hacemos todo lo posible para estar disponibles para asuntos urgentes fuera  del horario de [place], no estamos disponibles las 24 horas del da, los 7 [address]  [address] de la [place].   Si tiene un problema urgente y no puede comunicarse con nosotros, puede optar por buscar atencin mdica  en el consultorio de su doctor(a), en una clnica privada, en un centro de atencin urgente o en una sala de emergencias.  Si tiene Engineer, drilling, por favor llame inmediatamente al 911 o vaya a la sala de emergencias.  Nmeros de bper  - Dr. Gwen Pounds: [phone]  - Dra. Roseanne Reno: [phone]  - Dr. Katrinka Blazing: [phone]   En caso de inclemencias del tiempo, por favor llame a Lacy Duverney principal al [phone] para una actualizacin sobre el [place] de cualquier retraso o cierre.  Consejos para la medicacin en dermatologa: Por favor, guarde las cajas en las que vienen los medicamentos de uso tpico para ayudarle a seguir las instrucciones sobre dnde y cmo usarlos. Las farmacias generalmente imprimen las instrucciones del medicamento slo en las cajas y no directamente en los tubos del [place].   Si su medicamento es muy caro, por favor, pngase en contacto con Rolm Gala llamando al [phone] y presione la opcin 4 o envenos un mensaje a travs de Clinical cytogeneticist.   No podemos decirle cul ser su copago por los medicamentos por adelantado ya que esto es diferente dependiendo de la cobertura de su seguro. Sin embargo, es posible que podamos encontrar un medicamento sustituto a Audiological scientist un formulario para que el seguro cubra el medicamento que se considera necesario.   Si se requiere una autorizacin previa para que su compaa de seguros [place] su medicamento, por favor permtanos de 1 a 2 das hbiles para completar [address]  Los precios de los medicamentos varan con frecuencia dependiendo del Environmental consultant de dnde se surte la receta y alguna farmacias pueden ofrecer precios ms baratos.  El sitio web www.goodrx.com tiene cupones para medicamentos de  Health and safety inspector. Los precios aqu no tienen en cuenta lo que podra costar con la ayuda del seguro (puede ser ms barato con su seguro), pero el sitio web puede darle el precio si no utiliz Tourist information centre manager.  - Puede imprimir el cupn correspondiente y llevarlo con su receta a la farmacia.  - Tambin puede pasar por nuestra oficina durante el horario de atencin regular y Education officer, museum una tarjeta de cupones de GoodRx.  -  Si necesita que su receta se enve electrnicamente a Psychiatrist, informe a nuestra oficina a travs de MyChart de [place] o por telfono llamando al [phone] y presione la opcin 4.

## 2023-10-06 NOTE — Telephone Encounter (Signed)
 Called and spoke to the pt, regarding the return call request. He stated she don't remember why he called, but instead wanted to know if the medication request for baclofen he placed was received. He was informed the request has been received, just waiting for it to be reviewed by a covering provider. He verbally stated he understood and had no further questions

## 2023-10-11 MED ORDER — BACLOFEN 10 MG PO TABS
10.0000 mg | ORAL_TABLET | Freq: Three times a day (TID) | ORAL | 0 refills | Status: DC
Start: 2023-10-11 — End: 2024-02-16

## 2023-10-13 ENCOUNTER — Encounter: Payer: Self-pay | Admitting: Family Medicine

## 2023-10-13 ENCOUNTER — Ambulatory Visit (INDEPENDENT_AMBULATORY_CARE_PROVIDER_SITE_OTHER): Payer: Medicare Other | Admitting: Student in an Organized Health Care Education/Training Program

## 2023-10-13 ENCOUNTER — Encounter: Payer: Self-pay | Admitting: Student in an Organized Health Care Education/Training Program

## 2023-10-13 VITALS — BP 118/68 | HR 72 | Ht 70.0 in | Wt 165.2 lb

## 2023-10-13 DIAGNOSIS — G4733 Obstructive sleep apnea (adult) (pediatric): Secondary | ICD-10-CM | POA: Diagnosis not present

## 2023-10-13 DIAGNOSIS — J439 Emphysema, unspecified: Secondary | ICD-10-CM | POA: Diagnosis not present

## 2023-10-13 DIAGNOSIS — G894 Chronic pain syndrome: Secondary | ICD-10-CM

## 2023-10-13 DIAGNOSIS — R0602 Shortness of breath: Secondary | ICD-10-CM

## 2023-10-13 DIAGNOSIS — S22080D Wedge compression fracture of T11-T12 vertebra, subsequent encounter for fracture with routine healing: Secondary | ICD-10-CM | POA: Diagnosis not present

## 2023-10-13 NOTE — Progress Notes (Unsigned)
 Synopsis: Referred in *** by Erasmo Downer, MD  Assessment & Plan:   There are no diagnoses linked to this encounter.  Shortness of breath baseline (with exertion walking to mailbox). R leg edema stable, used albuterol a few times. Echo stable, PFT's with drop in DLCO. Continue to monitor. Follow up with DK for sleep.  Return in about 1 year (around 10/12/2024).  I spent *** minutes caring for this patient today, including {EM billing:28027}  [name], MD [facility] 10/13/2023 3:40 PM    End of visit medications:  No orders of the defined types were placed in this encounter.    Current Outpatient Medications:    albuterol (VENTOLIN HFA) 108 (90 Base) MCG/ACT inhaler, Inhale 2 puffs into the lungs every 6 (six) hours as needed for wheezing or shortness of breath., Disp: 8 g, Rfl: 2   alendronate (FOSAMAX) 70 MG tablet, Take 1 tablet (70 mg total) by mouth every 7 (seven) days. Take with a full glass of water on an empty stomach., Disp: 4 tablet, Rfl: 11   amLODipine (NORVASC) 10 MG tablet, Take 1 tablet (10 mg total) by mouth daily., Disp: 90 tablet, Rfl: 0   aspirin 81 MG tablet, Take 81 mg by mouth at bedtime., Disp: , Rfl:    atorvastatin (LIPITOR) 40 MG tablet, TAKE 1 TABLET BY MOUTH AT  BEDTIME, Disp: 100 tablet, Rfl: 0   baclofen (LIORESAL) 10 MG tablet, Take 1 tablet (10 mg total) by mouth 3 (three) times daily. TAKE 1 TABLET(10 MG) BY MOUTH THREE TIMES DAILY AS NEEDED FOR MUSCLE SPASMS, Disp: 30 tablet, Rfl: 0   Cholecalciferol (VITAMIN D3) 50 MCG (2000 UT) CAPS, Take by mouth daily., Disp: , Rfl:    Coenzyme Q10 (COQ10) 100 MG CAPS, Take 1 capsule by mouth daily.  , Disp: , Rfl:    furosemide (LASIX) 20 MG tablet, Take 1 tablet (20 mg) by mouth once daily as needed for swelling, Disp: 30 tablet, Rfl: 3   hydrALAZINE (APRESOLINE) 25 MG tablet, Take 1 tablet (25 mg total) by mouth 3 (three) times daily. Take as needed for blood pressure  greater then 140., Disp: 270 tablet, Rfl: 3   meclizine (ANTIVERT) 25 MG tablet, Take 1 tablet (25 mg total) by mouth 2 (two) times daily as needed for dizziness., Disp: 30 tablet, Rfl: 0   metroNIDAZOLE (FLAGYL) 500 MG tablet, Take 1 tablet (500 mg total) by mouth 2 (two) times daily., Disp: 20 tablet, Rfl: 0   Multiple Vitamin (MULTIVITAMIN PO), Take 1 tablet by mouth daily., Disp: , Rfl:    nitroGLYCERIN (NITROSTAT) 0.4 MG SL tablet, Place 1 tablet (0.4 mg total) under the tongue every 5 (five) minutes as needed for chest pain., Disp: 25 tablet, Rfl: 3   Oxycodone HCl 10 MG TABS, Take 1-2 tablets (10-20 mg total) by mouth every 6 (six) hours as needed (moderate to severe pain)., Disp: 120 tablet, Rfl: 0   saccharomyces boulardii (FLORASTOR) 250 MG capsule, Take 250 mg by mouth 2 (two) times daily., Disp: , Rfl:  No current facility-administered medications for this visit.  Facility-Administered Medications Ordered in Other Visits:    albuterol (PROVENTIL) (2.5 MG/3ML) 0.083% nebulizer solution 2.5 mg, 2.5 mg, Nebulization, Once, Merlene Laughter, MD   Subjective:   PATIENT ID: Brian [name] GENDER: male DOB: [birth year], MRN: [id number]  Chief Complaint  Patient presents with   Follow-up    HPI ***  Ancillary information including prior medications, full medical/surgical/family/social  histories, and PFTs (when available) are listed below and have been reviewed.   ROS   Objective:   Vitals:   10/13/23 1455  BP: 118/68  Pulse: 72  SpO2: 90%  Weight: 165 lb 3.2 oz (74.9 kg)  Height: 5\' 10"  (1.778 m)   90% on *** LPM *** RA BMI Readings from Last 3 Encounters:  10/13/23 23.70 kg/m  10/01/23 22.96 kg/m  09/28/23 22.96 kg/m   Wt Readings from Last 3 Encounters:  10/13/23 165 lb 3.2 oz (74.9 kg)  10/01/23 160 lb (72.6 kg)  09/28/23 160 lb (72.6 kg)    Physical Exam    Ancillary Information    Past Medical History:  Diagnosis Date   AAA (abdominal aortic  aneurysm) (HCC)    Adenomatous polyp    Anxiety    Carpal tunnel syndrome on left    Cervical spine disease    Colitis    Coronary artery disease    S/p anterior MI tx with PCI in 1998  //  Myoview 6/22: EF 45, ant and septal scar; no ischemia; intermediate risk // Echocardiogram 7/22: EF 45-50, ant-sept and apical HK, Gr 1 DD, normal RVSF, mild MR   Deviated septum    Hyperlipidemia    Hypertension    Renal Artery [place] 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 RAS bilaterally   Ischemic cardiomyopathy 05/19/2022   Echocardiogram 06/02/22: EF 45-50, global HK, Gr 1 DD, NL RVSF, NL PASP (RVSP 26.4), trivial MR, RAP 3   Low back pain    Myocardial infarction (HCC) 1998   Personal history of pulmonary embolism    Pulmonary embolism (HCC) 08/26/2022   Sleep apnea    CPAP   Wears dentures    full upper     Family History  Problem Relation Age of Onset   Heart failure Mother    Kidney disease Father    Hypertension Father    Coronary artery disease Brother    Alzheimer's disease Maternal Grandmother    Colon cancer Maternal Grandfather      Past Surgical History:  Procedure Laterality Date   APPENDECTOMY     AQUEOUS SHUNT Right 10/26/2022   Procedure: AHMED TUBE SHUNT WITH TUTOPLAST RIGHT;  Surgeon: Nevada Crane, MD;  [facility];  Service: Ophthalmology;  Laterality: Right;   bunions     CATARACT EXTRACTION     COLONOSCOPY WITH PROPOFOL N/A 02/19/2015   Procedure: COLONOSCOPY WITH PROPOFOL;  Surgeon: Midge Minium, MD;  Location: [facility];  Service: Endoscopy;  Laterality: N/A;   COLONOSCOPY WITH PROPOFOL N/A 06/05/2019   Procedure: COLONOSCOPY WITH BIOPSY;  Surgeon: Midge Minium, MD;  Location: [facility];  Service: Endoscopy;  Laterality: N/A;  sleep apnea   ERCP N/A 08/25/2022   Procedure: ENDOSCOPIC RETROGRADE CHOLANGIOPANCREATOGRAPHY (ERCP);  Surgeon: Midge Minium, MD;  Location[facility];  Service: Endoscopy;  Laterality: N/A;   HEMORRHOID BANDING     HERNIA  REPAIR     x3   OLECRANON BURSECTOMY Right    POLYPECTOMY N/A 06/05/2019   Procedure: POLYPECTOMY;  Surgeon: Midge Minium, MD;  Location: [facility];  Service: Endoscopy;  Laterality: N/A;    Social History   Socioeconomic History   Marital status: Married    Spouse name: [name]   Number of children: 1   Years of education: Not on file   Highest education level: Some college, no degree  Occupational History   Occupation: retired    Comment: Electronics engineer  Tobacco Use  Smoking status: Every Day    Current packs/day: 0.50    Average packs/day: 0.5 packs/day for 65.2 years (32.6 ttl pk-yrs)    Types: Cigarettes    Start date: 78   Smokeless tobacco: Never   Tobacco comments:    1/2 3/4 ppd (since age 85)  Vaping Use   Vaping status: Never Used  Substance and Sexual Activity   Alcohol use: Not Currently    Comment: occasionally, "none   Drug use: No   Sexual activity: Yes    Partners: Female  Other Topics Concern   Not on file  Social History Narrative   Not on file   Social Drivers of Health   Financial Resource Strain: Low Risk  (09/06/2023)   Received from [facility]   Overall Financial Resource Strain (CARDIA)    Difficulty of Paying Living Expenses: Not hard at all  Food Insecurity: No Food Insecurity (09/06/2023)   Received from [facility]   Hunger Vital Sign    Worried About Running Out of Food in the Last Year: Never true    Ran Out of Food in the Last Year: Never true  Transportation Needs: No Transportation Needs (09/06/2023)   Received from [facility] - Transportation    In the past 12 months, has lack of transportation kept you from medical appointments or from getting medications?: No    Lack of Transportation (Non-Medical): No  Physical Activity: Unknown (09/03/2023)   Exercise Vital Sign    Days of Exercise per Week: Patient declined    Minutes of  Exercise per Session: 0 min  Stress: Patient Declined (09/03/2023)   [employer] of Occupational Health - Occupational Stress Questionnaire    Feeling of Stress : Patient declined  Social Connections: Unknown (09/03/2023)   Social Connection and Isolation Panel [NHANES]    Frequency of Communication with Friends and Family: More than three times a week    Frequency of Social Gatherings with Friends and Family: Once a week    Attends Religious Services: Patient declined    Active Member of Clubs or Organizations: No    Attends Banker Meetings: Never    Marital Status: Married  Catering manager Violence: Not At Risk (05/25/2023)   Humiliation, Afraid, Rape, and Kick questionnaire    Fear of Current or Ex-Partner: No    Emotionally Abused: No    Physically Abused: No    Sexually Abused: No     No Known Allergies   CBC    Component Value Date/Time   WBC 10.0 02/01/2023 1520   WBC 6.7 08/29/2022 0333   RBC 4.96 02/01/2023 1520   RBC 4.49 09/09/2022 0000   HGB 14.9 02/01/2023 1520   HCT 43.5 02/01/2023 1520   PLT 253 02/01/2023 1520   MCV 88 02/01/2023 1520   MCV 91 02/26/2014 1727   MCH 30.0 02/01/2023 1520   MCH 29.8 08/29/2022 0333   MCHC 34.3 02/01/2023 1520   MCHC 34.4 08/29/2022 0333   RDW 12.3 02/01/2023 1520   RDW 13.6 02/26/2014 1727   LYMPHSABS 2.0 02/01/2023 1520   MONOABS 1.6 (H) 08/26/2022 1501   EOSABS 0.3 02/01/2023 1520   BASOSABS 0.1 02/01/2023 1520    Pulmonary Functions Testing Results:    Latest Ref Rng & Units 03/19/2022    3:14 PM  PFT Results  FVC-Pre L 3.90   FVC-Predicted Pre % 97   Pre FEV1/FVC % % 73  FEV1-Pre L 2.83   FEV1-Predicted Pre % 99   DLCO uncorrected ml/min/mmHg 10.90   DLCO UNC% % 44   DLVA Predicted % 40   TLC L 6.93   TLC % Predicted % 98   RV % Predicted % 100     Outpatient Medications Prior to Visit  Medication Sig Dispense Refill   albuterol (VENTOLIN HFA) 108 (90 Base) MCG/ACT inhaler Inhale 2  puffs into the lungs every 6 (six) hours as needed for wheezing or shortness of breath. 8 g 2   alendronate (FOSAMAX) 70 MG tablet Take 1 tablet (70 mg total) by mouth every 7 (seven) days. Take with a full glass of water on an empty stomach. 4 tablet 11   amLODipine (NORVASC) 10 MG tablet Take 1 tablet (10 mg total) by mouth daily. 90 tablet 0   aspirin 81 MG tablet Take 81 mg by mouth at bedtime.     atorvastatin (LIPITOR) 40 MG tablet TAKE 1 TABLET BY MOUTH AT  BEDTIME 100 tablet 0   baclofen (LIORESAL) 10 MG tablet Take 1 tablet (10 mg total) by mouth 3 (three) times daily. TAKE 1 TABLET(10 MG) BY MOUTH THREE TIMES DAILY AS NEEDED FOR MUSCLE SPASMS 30 tablet 0   Cholecalciferol (VITAMIN D3) 50 MCG (2000 UT) CAPS Take by mouth daily.     Coenzyme Q10 (COQ10) 100 MG CAPS Take 1 capsule by mouth daily.       furosemide (LASIX) 20 MG tablet Take 1 tablet (20 mg) by mouth once daily as needed for swelling 30 tablet 3   hydrALAZINE (APRESOLINE) 25 MG tablet Take 1 tablet (25 mg total) by mouth 3 (three) times daily. Take as needed for blood pressure greater then 140. 270 tablet 3   meclizine (ANTIVERT) 25 MG tablet Take 1 tablet (25 mg total) by mouth 2 (two) times daily as needed for dizziness. 30 tablet 0   metroNIDAZOLE (FLAGYL) 500 MG tablet Take 1 tablet (500 mg total) by mouth 2 (two) times daily. 20 tablet 0   Multiple Vitamin (MULTIVITAMIN PO) Take 1 tablet by mouth daily.     nitroGLYCERIN (NITROSTAT) 0.4 MG SL tablet Place 1 tablet (0.4 mg total) under the tongue every 5 (five) minutes as needed for chest pain. 25 tablet 3   Oxycodone HCl 10 MG TABS Take 1-2 tablets (10-20 mg total) by mouth every 6 (six) hours as needed (moderate to severe pain). 120 tablet 0   saccharomyces boulardii (FLORASTOR) 250 MG capsule Take 250 mg by mouth 2 (two) times daily.     Facility-Administered Medications Prior to Visit  Medication Dose Route Frequency Provider Last Rate Last Admin   albuterol  (PROVENTIL) (2.5 MG/3ML) 0.083% nebulizer solution 2.5 mg  2.5 mg Nebulization Once Merlene Laughter, MD

## 2023-10-14 MED ORDER — OXYCODONE HCL 10 MG PO TABS: 10.0000 mg | ORAL_TABLET | Freq: Four times a day (QID) | ORAL | 0 refills | Status: DC | PRN

## 2023-10-15 DIAGNOSIS — R2681 Unsteadiness on feet: Secondary | ICD-10-CM | POA: Diagnosis not present

## 2023-10-15 DIAGNOSIS — R531 Weakness: Secondary | ICD-10-CM | POA: Diagnosis not present

## 2023-10-15 DIAGNOSIS — M25641 Stiffness of right hand, not elsewhere classified: Secondary | ICD-10-CM | POA: Diagnosis not present

## 2023-10-15 DIAGNOSIS — H814 Vertigo of central origin: Secondary | ICD-10-CM | POA: Diagnosis not present

## 2023-10-17 ENCOUNTER — Encounter: Payer: Self-pay | Admitting: Student in an Organized Health Care Education/Training Program

## 2023-10-17 DIAGNOSIS — J439 Emphysema, unspecified: Secondary | ICD-10-CM

## 2023-10-19 MED ORDER — ANORO ELLIPTA 62.5-25 MCG/ACT IN AEPB: 1.0000 | INHALATION_SPRAY | Freq: Every day | RESPIRATORY_TRACT | 11 refills | Status: DC

## 2023-10-19 MED ORDER — BREO ELLIPTA 50-25 MCG/INH IN AEPB: 1.0000 | INHALATION_SPRAY | Freq: Every day | RESPIRATORY_TRACT | 3 refills | Status: DC

## 2023-10-20 DIAGNOSIS — R531 Weakness: Secondary | ICD-10-CM | POA: Diagnosis not present

## 2023-10-20 DIAGNOSIS — R2681 Unsteadiness on feet: Secondary | ICD-10-CM | POA: Diagnosis not present

## 2023-10-20 DIAGNOSIS — M25641 Stiffness of right hand, not elsewhere classified: Secondary | ICD-10-CM | POA: Diagnosis not present

## 2023-10-20 DIAGNOSIS — H814 Vertigo of central origin: Secondary | ICD-10-CM | POA: Diagnosis not present

## 2023-10-25 ENCOUNTER — Ambulatory Visit (INDEPENDENT_AMBULATORY_CARE_PROVIDER_SITE_OTHER)

## 2023-10-25 DIAGNOSIS — S22080D Wedge compression fracture of T11-T12 vertebra, subsequent encounter for fracture with routine healing: Secondary | ICD-10-CM

## 2023-10-25 NOTE — Progress Notes (Signed)
 Brian [name] presented to the office today for an adjustment of his TLSO brace. I adjusted the height of the chest piece and the tightness of the shoulder straps. He has been wearing the shoulder straps over his shoulders rather than under his shoulders. He prefers to continue wearing the straps over his shoulders, as he feels this is more comfortable and prevents the brace from sliding. Prior to leaving, he stated that his brace felt much more comfortable.  Sharlot Gowda, RN [facility] at [facility]

## 2023-10-27 NOTE — Progress Notes (Signed)
 Referring Physician:  Mazie Speed, MD [address] [place],  [place] [address]  Primary Physician:  Brian Speed, MD  History of Present Illness: [name] has a history of ischemic bowel disease, MI, HTN, CAD, ischemic cardiomyopathy, history of PE, sleep apnea, hyperlipidemia, chronic pain syndrome, skin CA.   Last seen by me on 10/01/23 for some left sided rib pain. He also has known T12 compression fracture that likely occurred around 09/09/23.   He was to wear TLSO brace when up and out of bed. He is here for follow up.   He is wearing his brace. He has constant mid to lower back pain that is more to the left. Pain is minimal in the morning and worse as the day progresses. No radiation to his ribs.  He still has significant pain in the evening. No leg pain. Left sided rib pain is better.   He notes numbness in right ring/small finger that started about 4 weeks ago. No weakness noted.   He has some numbness in knees down to feet that is chronic. He has chronic weakness in right foot that is chronic.   He has been on oxycodone long term from his PCP. He is taking prn baclofen from PCP as well. PCP started him on fosamax after his compression fracture.   He is caregiver for his wife.   He smokes 1/2 PPD x 60+ years. He recently quit.   Bowel or bladder issues: none  Surgery: no spinal surgeries  The symptoms are causing a significant impact on the patient's life.   Review of Systems:  A 10 point review of systems is negative, except for the pertinent positives and negatives detailed in the HPI.  Past Medical History: Past Medical History:  Diagnosis Date   AAA (abdominal aortic aneurysm) (HCC)    Adenomatous polyp    Anxiety    Carpal tunnel syndrome on left    Cervical spine disease    Colitis    Coronary artery disease    S/p anterior MI tx with PCI in 1998  //  Myoview 6/22: EF 45, ant and septal scar; no ischemia; intermediate  risk // Echocardiogram 7/22: EF 45-50, ant-sept and apical HK, Gr 1 DD, normal RVSF, mild MR   Deviated septum    Hyperlipidemia    Hypertension    Renal Artery US  12/2022: no RAS bilaterally   Ischemic cardiomyopathy 05/19/2022   Echocardiogram 06/02/22: EF 45-50, global HK, Gr 1 DD, NL RVSF, NL PASP (RVSP 26.4), trivial MR, RAP 3   Low back pain    Myocardial infarction (HCC) 1998   Personal history of pulmonary embolism    Pulmonary embolism (HCC) 08/26/2022   Sleep apnea    CPAP   Wears dentures    full upper    Past Surgical History: Past Surgical History:  Procedure Laterality Date   APPENDECTOMY     AQUEOUS SHUNT Right 10/26/2022   Procedure: AHMED TUBE SHUNT WITH TUTOPLAST RIGHT;  Surgeon: Rosa College, MD;  Location: [facility];  Service: Ophthalmology;  Laterality: Right;   bunions     CATARACT EXTRACTION     COLONOSCOPY WITH PROPOFOL N/A 02/19/2015   Procedure: COLONOSCOPY WITH PROPOFOL;  Surgeon: Marnee Sink, MD;  Location: [facility] ENDOSCOPY;  Service: Endoscopy;  Laterality: N/A;   COLONOSCOPY WITH PROPOFOL N/A 06/05/2019   Procedure: COLONOSCOPY WITH BIOPSY;  Surgeon: Marnee Sink, MD;  Location: [facility];  Service: Endoscopy;  Laterality:  N/A;  sleep apnea   ERCP N/A 08/25/2022   Procedure: ENDOSCOPIC RETROGRADE CHOLANGIOPANCREATOGRAPHY (ERCP);  Surgeon: Marnee Sink, MD;  Location: [facility];  Service: Endoscopy;  Laterality: N/A;   HEMORRHOID BANDING     HERNIA REPAIR     x3   OLECRANON BURSECTOMY Right    POLYPECTOMY N/A 06/05/2019   Procedure: POLYPECTOMY;  Surgeon: Marnee Sink, MD;  Location: [facility];  Service: Endoscopy;  Laterality: N/A;    Allergies: Allergies as of 11/02/2023   (No Known Allergies)    Medications: Outpatient Encounter Medications as of 11/02/2023  Medication Sig   albuterol (VENTOLIN HFA) 108 (90 Base) MCG/ACT inhaler Inhale 2 puffs into the lungs every 6 (six) hours as needed for wheezing or  shortness of breath.   alendronate (FOSAMAX) 70 MG tablet Take 1 tablet (70 mg total) by mouth every 7 (seven) days. Take with a full glass of water on an empty stomach.   amLODipine (NORVASC) 10 MG tablet Take 1 tablet (10 mg total) by mouth daily.   aspirin 81 MG tablet Take 81 mg by mouth at bedtime.   atorvastatin (LIPITOR) 40 MG tablet TAKE 1 TABLET BY MOUTH AT  BEDTIME   baclofen (LIORESAL) 10 MG tablet Take 1 tablet (10 mg total) by mouth 3 (three) times daily. TAKE 1 TABLET(10 MG) BY MOUTH THREE TIMES DAILY AS NEEDED FOR MUSCLE SPASMS   Cholecalciferol (VITAMIN D3) 50 MCG (2000 UT) CAPS Take by mouth daily.   Coenzyme Q10 (COQ10) 100 MG CAPS Take 1 capsule by mouth daily.     Fluticasone Furoate-Vilanterol (BREO ELLIPTA) 50-25 MCG/ACT AEPB Inhale 1 puff into the lungs daily.   furosemide (LASIX) 20 MG tablet Take 1 tablet (20 mg) by mouth once daily as needed for swelling   meclizine (ANTIVERT) 25 MG tablet Take 1 tablet (25 mg total) by mouth 2 (two) times daily as needed for dizziness.   metroNIDAZOLE (FLAGYL) 500 MG tablet Take 1 tablet (500 mg total) by mouth 2 (two) times daily.   Multiple Vitamin (MULTIVITAMIN PO) Take 1 tablet by mouth daily.   nitroGLYCERIN (NITROSTAT) 0.4 MG SL tablet Place 1 tablet (0.4 mg total) under the tongue every 5 (five) minutes as needed for chest pain.   Oxycodone HCl 10 MG TABS Take 1-2 tablets (10-20 mg total) by mouth every 6 (six) hours as needed (moderate to severe pain).   saccharomyces boulardii (FLORASTOR) 250 MG capsule Take 250 mg by mouth 2 (two) times daily.   hydrALAZINE (APRESOLINE) 25 MG tablet Take 1 tablet (25 mg total) by mouth 3 (three) times daily. Take as needed for blood pressure greater then 140.   Facility-Administered Encounter Medications as of 11/02/2023  Medication   albuterol (PROVENTIL) (2.5 MG/3ML) 0.083% nebulizer solution 2.5 mg    Social History: Social History   Tobacco Use   Smoking status: Every Day    Current  packs/day: 0.50    Average packs/day: 0.5 packs/day for 65.3 years (32.6 ttl pk-yrs)    Types: Cigarettes    Start date: 1960   Smokeless tobacco: Never   Tobacco comments:    1/2 3/4 ppd (since age 1)  Vaping Use   Vaping status: Never Used  Substance Use Topics   Alcohol use: Not Currently    Comment: occasionally, "none   Drug use: No    Family Medical History: Family History  Problem Relation Age of Onset   Heart failure Mother    Kidney disease Father  Hypertension Father    Coronary artery disease Brother    Alzheimer's disease Maternal Grandmother    Colon cancer Maternal Grandfather     Physical Examination: Vitals:   11/02/23 1336  BP: 112/64      Awake, alert, oriented to person, place, and time.  Speech is clear and fluent. Fund of knowledge is appropriate.   Cranial Nerves: Pupils equal round and reactive to light.  Facial tone is symmetric.    Mild tenderness TL junction.   No abnormal lesions on exposed skin.   Strength: Side Biceps Triceps Deltoid Interossei Grip Wrist Ext. Wrist Flex.  R 5 5 5 5 5 5 5   L 5 5 5 5 5 5 5    Side Iliopsoas Quads Hamstring PF DF EHL  R 5 5 5 5 3 3   L 5 5 5 5 5 5    Right foot drop is chronic per patient.   Reflexes are 2+ and symmetric at the biceps, brachioradialis, patella and achilles, but right achilles is diminished.    Bilateral upper and lower extremity sensation is intact to light touch, slightly diminished in both legs from knees down to feet.   Gait is slow.   Medical Decision Making  Imaging: Lumbar spine xrays dated 11/01/23:  T12 compression fracture that appears grossly stable in comparison to previous lumbar xrays.    Report not available yet for above xrays.   Assessment and Plan: Mr. Dault has known T12 compression fracture that likely happened around 09/09/23.   He has constant mid to lower back pain that is more to the left. Pain is minimal in the morning and worse as the day progresses.  No radiation to his ribs.  He still has significant pain in the evening. No leg pain. Left sided rib pain is better.   History of right drop foot years ago and he still has weakness on exam today.   He notes 4 weeks of numbness in right ring and small finger. No neck or arm pain.   He also has known underlying lumbar scoliosis, spondylosis, and multilevel foraminal stenosis.   Current pain continues to likely be from T12 compression fracture. This appears to be grossly stable compared to previous xrays.   Treatment options discussed with patient and following plan made:   - He is still having significant back pain in the evenings. Referral to IR to discuss possible kyphoplasty.  - Continue with TLSO brace when up and walking. No bending, twisting, or lifting.  - Continue on oxycodone and baclofen from PCP.  - Will watch numbness/tingling in right hand for now since it's only been going for a month. If this persists, may consider EMG.  - Will call him a few days after he sees IR to regroup and decide on follow up.   I spent a total of 30 minutes in face-to-face and non-face-to-face activities related to this patient's care today including review of outside records, review of imaging, review of symptoms, physical exam, discussion of differential diagnosis, discussion of treatment options, and documentation.   Lucetta Russel PA-C Dept. of Neurosurgery

## 2023-10-28 DIAGNOSIS — M5416 Radiculopathy, lumbar region: Secondary | ICD-10-CM | POA: Diagnosis not present

## 2023-11-01 ENCOUNTER — Ambulatory Visit
Admission: RE | Admit: 2023-11-01 | Discharge: 2023-11-01 | Disposition: A | Discharge status: Home / Self Care | Attending: Orthopedic Surgery | Admitting: Orthopedic Surgery

## 2023-11-01 ENCOUNTER — Ambulatory Visit
Admission: RE | Admit: 2023-11-01 | Discharge: 2023-11-01 | Disposition: A | Discharge status: Home / Self Care | Source: Ambulatory Visit | Attending: Orthopedic Surgery | Admitting: Orthopedic Surgery

## 2023-11-01 DIAGNOSIS — M438X6 Other specified deforming dorsopathies, lumbar region: Secondary | ICD-10-CM | POA: Diagnosis not present

## 2023-11-01 DIAGNOSIS — R2681 Unsteadiness on feet: Secondary | ICD-10-CM | POA: Diagnosis not present

## 2023-11-01 DIAGNOSIS — H814 Vertigo of central origin: Secondary | ICD-10-CM | POA: Diagnosis not present

## 2023-11-01 DIAGNOSIS — M4854XA Collapsed vertebra, not elsewhere classified, thoracic region, initial encounter for fracture: Secondary | ICD-10-CM | POA: Diagnosis not present

## 2023-11-01 DIAGNOSIS — S22080A Wedge compression fracture of T11-T12 vertebra, initial encounter for closed fracture: Secondary | ICD-10-CM

## 2023-11-01 DIAGNOSIS — R531 Weakness: Secondary | ICD-10-CM | POA: Diagnosis not present

## 2023-11-01 DIAGNOSIS — M47816 Spondylosis without myelopathy or radiculopathy, lumbar region: Secondary | ICD-10-CM | POA: Diagnosis not present

## 2023-11-01 DIAGNOSIS — M25641 Stiffness of right hand, not elsewhere classified: Secondary | ICD-10-CM | POA: Diagnosis not present

## 2023-11-01 DIAGNOSIS — M5186 Other intervertebral disc disorders, lumbar region: Secondary | ICD-10-CM | POA: Diagnosis not present

## 2023-11-02 ENCOUNTER — Encounter: Payer: Self-pay | Admitting: Orthopedic Surgery

## 2023-11-02 ENCOUNTER — Ambulatory Visit: Admitting: Orthopedic Surgery

## 2023-11-02 VITALS — BP 112/64 | Ht 70.0 in | Wt 165.0 lb

## 2023-11-02 DIAGNOSIS — R202 Paresthesia of skin: Secondary | ICD-10-CM | POA: Diagnosis not present

## 2023-11-02 DIAGNOSIS — S22080D Wedge compression fracture of T11-T12 vertebra, subsequent encounter for fracture with routine healing: Secondary | ICD-10-CM | POA: Diagnosis not present

## 2023-11-02 DIAGNOSIS — M48061 Spinal stenosis, lumbar region without neurogenic claudication: Secondary | ICD-10-CM

## 2023-11-02 DIAGNOSIS — M47816 Spondylosis without myelopathy or radiculopathy, lumbar region: Secondary | ICD-10-CM | POA: Diagnosis not present

## 2023-11-02 DIAGNOSIS — M419 Scoliosis, unspecified: Secondary | ICD-10-CM

## 2023-11-02 NOTE — Patient Instructions (Signed)
 It was so nice to see you today. Thank you so much for coming in.    You have a broken bone (compression fracture) at T12. I want you to see interventional radiology to discuss a kyphoplasty procedure. They should call you to schedule this. Call me if you don't hear anything by next week.    You do not need to wear brace to sleep. Can remove when sitting and watching TV. You need to wear brace when you are up and walking.   No bending, twisting, or lifting.   Will watch numbness in right hand for now. Consider nerve test if it does not get better.   I will call you a few days after you see radiology so we can regroup.   Please do not hesitate to call if you have any questions or concerns. You can also message me in MyChart.   Lucetta Russel PA-C [phone]     The physicians and staff at [facility] at [facility] are committed to providing excellent care. You may receive a survey asking for feedback about your experience at our office. We value you your feedback and appreciate you taking the time to to fill it out. The [facility] leadership team is also available to discuss your experience in person, feel free to contact us  [phone].

## 2023-11-09 ENCOUNTER — Other Ambulatory Visit: Payer: Self-pay | Admitting: Physician Assistant

## 2023-11-09 DIAGNOSIS — S22080D Wedge compression fracture of T11-T12 vertebra, subsequent encounter for fracture with routine healing: Secondary | ICD-10-CM

## 2023-11-10 ENCOUNTER — Telehealth: Payer: Self-pay

## 2023-11-10 ENCOUNTER — Other Ambulatory Visit (HOSPITAL_COMMUNITY): Payer: Self-pay | Admitting: Interventional Radiology

## 2023-11-10 DIAGNOSIS — M489 Spondylopathy, unspecified: Secondary | ICD-10-CM | POA: Diagnosis not present

## 2023-11-10 DIAGNOSIS — I25119 Atherosclerotic heart disease of native coronary artery with unspecified angina pectoris: Secondary | ICD-10-CM | POA: Diagnosis not present

## 2023-11-10 DIAGNOSIS — M545 Low back pain, unspecified: Secondary | ICD-10-CM | POA: Diagnosis not present

## 2023-11-10 DIAGNOSIS — I7121 Aneurysm of the ascending aorta, without rupture: Secondary | ICD-10-CM | POA: Diagnosis not present

## 2023-11-10 DIAGNOSIS — G8929 Other chronic pain: Secondary | ICD-10-CM | POA: Diagnosis not present

## 2023-11-10 NOTE — Telephone Encounter (Signed)
 Please send the order to [facility] if available- patient aware

## 2023-11-10 NOTE — Telephone Encounter (Signed)
 Please let the patient know that he will need to have a DEXA scan. [facility] can get the patients in quick, I will send the order over to them if he is ok with that. Please let me know.

## 2023-11-10 NOTE — Telephone Encounter (Signed)
 They dont do them in [place] but I will go ahead and fax the order to [facility] in [place].

## 2023-11-10 NOTE — Telephone Encounter (Signed)
-----   Message from [name] sent at 11/09/2023  7:51 AM EDT ----- Regarding: RE: Bone density Hello [name]!  I have just placed the DEXA scan order. ----- Message ----- From: [name], RN Sent: 11/08/2023   3:56 PM EDT To: Brian Safer, PA-C Subject: Bone density                                   Hi Brian [name], since Brian [name] is unavailable I am sending this message to you. [facility] will be consulting with this patient on 11/11/23 for kyphoplasty. In order to get insurance to cover, he will need a bone density scan. I don't see any on his chart. Is this something you can help me with? Thank you!

## 2023-11-11 ENCOUNTER — Other Ambulatory Visit: Payer: Self-pay

## 2023-11-11 ENCOUNTER — Ambulatory Visit
Admission: RE | Admit: 2023-11-11 | Discharge: 2023-11-11 | Disposition: A | Discharge status: Home / Self Care | Source: Ambulatory Visit | Attending: Orthopedic Surgery | Admitting: Orthopedic Surgery

## 2023-11-11 ENCOUNTER — Encounter: Payer: Self-pay | Admitting: Family Medicine

## 2023-11-11 ENCOUNTER — Inpatient Hospital Stay
Admission: RE | Admit: 2023-11-11 | Discharge: 2023-11-11 | Disposition: A | Discharge status: Home / Self Care | Source: Ambulatory Visit | Attending: Orthopedic Surgery | Admitting: Orthopedic Surgery

## 2023-11-11 DIAGNOSIS — I7121 Aneurysm of the ascending aorta, without rupture: Secondary | ICD-10-CM

## 2023-11-11 DIAGNOSIS — S22080D Wedge compression fracture of T11-T12 vertebra, subsequent encounter for fracture with routine healing: Secondary | ICD-10-CM

## 2023-11-11 DIAGNOSIS — M545 Low back pain, unspecified: Secondary | ICD-10-CM

## 2023-11-11 DIAGNOSIS — Z961 Presence of intraocular lens: Secondary | ICD-10-CM | POA: Diagnosis not present

## 2023-11-11 DIAGNOSIS — I25119 Atherosclerotic heart disease of native coronary artery with unspecified angina pectoris: Secondary | ICD-10-CM

## 2023-11-11 DIAGNOSIS — H1031 Unspecified acute conjunctivitis, right eye: Secondary | ICD-10-CM | POA: Diagnosis not present

## 2023-11-11 DIAGNOSIS — S22080A Wedge compression fracture of T11-T12 vertebra, initial encounter for closed fracture: Secondary | ICD-10-CM | POA: Diagnosis not present

## 2023-11-11 DIAGNOSIS — G894 Chronic pain syndrome: Secondary | ICD-10-CM

## 2023-11-11 DIAGNOSIS — H4051X3 Glaucoma secondary to other eye disorders, right eye, severe stage: Secondary | ICD-10-CM | POA: Diagnosis not present

## 2023-11-11 DIAGNOSIS — M489 Spondylopathy, unspecified: Secondary | ICD-10-CM

## 2023-11-11 HISTORY — PX: IR RADIOLOGIST EVAL & MGMT: IMG5224

## 2023-11-11 MED ORDER — OXYCODONE HCL 10 MG PO TABS: 10.0000 mg | ORAL_TABLET | Freq: Four times a day (QID) | ORAL | 0 refills | Status: DC | PRN

## 2023-11-11 NOTE — Consult Note (Signed)
 Chief Complaint: Patient was seen in consultation today for T12 compression fracture, presumed osteoporotic with delayed healing  at the request of Luna,Stacy  Referring Physician(s): Luna,Stacy  History of Present Illness: Brian [name] is a 83 y.o. male with a history of scoliosis, degenerative disc disease, facet arthropathy and chromic back pain presents to discuss treatment options for a subacute T12 fracture.   MRI imaging from 09/15/23 - Acute inferior endplate compression fracture of the T12 vertebral body with approximately 40% vertebral body height loss.  More recent Lumbar Spine Xrays 11/01/23 - grossy stable T12 fracture.  He has been wearing a TSLO brace for 4 weeks and taking hydrocodone  which helps with his pain.  He feels pretty good in the morning but notes progress pain and tightness in his back throughout the day.  By dinner time he is often gritting his teeth and just suffering through until he can lay down at bed time.   He is the primary care giver for his wife who has severe emphysema.   Past Medical History:  Diagnosis Date   AAA (abdominal aortic aneurysm) (HCC)    Adenomatous polyp    Anxiety    Carpal tunnel syndrome on left    Cervical spine disease    Colitis    Coronary artery disease    S/p anterior MI tx with PCI in 1998  //  Myoview  6/22: EF 45, ant and septal scar; no ischemia; intermediate risk // Echocardiogram 7/22: EF 45-50, ant-sept and apical HK, Gr 1 DD, normal RVSF, mild MR   Deviated septum    Hyperlipidemia    Hypertension    Renal Artery US  12/2022: no RAS bilaterally   Ischemic cardiomyopathy 05/19/2022   Echocardiogram 06/02/22: EF 45-50, global HK, Gr 1 DD, NL RVSF, NL PASP (RVSP 26.4), trivial MR, RAP 3   Low back pain    Myocardial infarction (HCC) 1998   Personal history of pulmonary embolism    Pulmonary embolism (HCC) 08/26/2022   Sleep apnea    CPAP   Wears dentures    full upper    Past Surgical History:   Procedure Laterality Date   APPENDECTOMY     AQUEOUS SHUNT Right 10/26/2022   Procedure: AHMED TUBE SHUNT WITH TUTOPLAST RIGHT;  Surgeon: Rosa College, MD;  Location: [facility];  Service: Ophthalmology;  Laterality: Right;   bunions     CATARACT EXTRACTION     COLONOSCOPY WITH PROPOFOL  N/A 02/19/2015   Procedure: COLONOSCOPY WITH PROPOFOL ;  Surgeon: Marnee Sink, MD;  Location: [facility] ENDOSCOPY;  Service: Endoscopy;  Laterality: N/A;   COLONOSCOPY WITH PROPOFOL  N/A 06/05/2019   Procedure: COLONOSCOPY WITH BIOPSY;  Surgeon: Marnee Sink, MD;  Location: [facility];  Service: Endoscopy;  Laterality: N/A;  sleep apnea   ERCP N/A 08/25/2022   Procedure: ENDOSCOPIC RETROGRADE CHOLANGIOPANCREATOGRAPHY (ERCP);  Surgeon: Marnee Sink, MD;  Location: [facility] ENDOSCOPY;  Service: Endoscopy;  Laterality: N/A;   HEMORRHOID BANDING     HERNIA REPAIR     x3   IR RADIOLOGIST EVAL & MGMT  11/11/2023   OLECRANON BURSECTOMY Right    POLYPECTOMY N/A 06/05/2019   Procedure: POLYPECTOMY;  Surgeon: Marnee Sink, MD;  Location: [facility];  Service: Endoscopy;  Laterality: N/A;    Allergies: Patient has no known allergies.  Medications: Prior to Admission medications   Medication Sig Start Date End Date Taking? Authorizing Provider  albuterol  (VENTOLIN  HFA) 108 (90 Base) MCG/ACT inhaler Inhale 2 puffs into the  lungs every 6 (six) hours as needed for wheezing or shortness of breath. 09/17/23   Kasa, Kurian, MD  alendronate  (FOSAMAX ) 70 MG tablet Take 1 tablet (70 mg total) by mouth every 7 (seven) days. Take with a full glass of water  on an empty stomach. 09/16/23   Ostwalt, Janna, PA-C  amLODipine  (NORVASC ) 10 MG tablet Take 1 tablet (10 mg total) by mouth daily. 06/03/23   Gollan, Timothy J, MD  aspirin  81 MG tablet Take 81 mg by mouth at bedtime.    [provider]  atorvastatin  (LIPITOR) 40 MG tablet TAKE 1 TABLET BY MOUTH AT  BEDTIME 09/23/23   Bacigalupo, Stan Eans, MD   baclofen  (LIORESAL ) 10 MG tablet Take 1 tablet (10 mg total) by mouth 3 (three) times daily. TAKE 1 TABLET(10 MG) BY MOUTH THREE TIMES DAILY AS NEEDED FOR MUSCLE SPASMS 10/11/23   Ostwalt, Janna, PA-C  Cholecalciferol (VITAMIN D3) 50 MCG (2000 UT) CAPS Take by mouth daily.    [provider]  Coenzyme Q10 (COQ10) 100 MG CAPS Take 1 capsule by mouth daily.      [provider]  Fluticasone Furoate-Vilanterol (BREO ELLIPTA ) 50-25 MCG/ACT AEPB Inhale 1 puff into the lungs daily. 10/19/23   Vergia Glasgow, MD  furosemide  (LASIX ) 20 MG tablet Take 1 tablet (20 mg) by mouth once daily as needed for swelling 07/18/23   Gollan, Timothy J, MD  hydrALAZINE  (APRESOLINE ) 25 MG tablet Take 1 tablet (25 mg total) by mouth 3 (three) times daily. Take as needed for blood pressure greater then 140. 01/05/23 10/13/23  Gollan, Timothy J, MD  meclizine  (ANTIVERT ) 25 MG tablet Take 1 tablet (25 mg total) by mouth 2 (two) times daily as needed for dizziness. 08/29/22   Tiajuana Fluke, MD  metroNIDAZOLE  (FLAGYL ) 500 MG tablet Take 1 tablet (500 mg total) by mouth 2 (two) times daily. 04/22/23   Marnee Sink, MD  Multiple Vitamin (MULTIVITAMIN PO) Take 1 tablet by mouth daily.    [provider]  nitroGLYCERIN  (NITROSTAT ) 0.4 MG SL tablet Place 1 tablet (0.4 mg total) under the tongue every 5 (five) minutes as needed for chest pain. 01/05/23   Gollan, Timothy J, MD  Oxycodone  HCl 10 MG TABS Take 1-2 tablets (10-20 mg total) by mouth every 6 (six) hours as needed (moderate to severe pain). 10/14/23   Bacigalupo, Angela M, MD  saccharomyces boulardii (FLORASTOR) 250 MG capsule Take 250 mg by mouth 2 (two) times daily.    [provider]     Family History  Problem Relation Age of Onset   Heart failure Mother    Kidney disease Father    Hypertension Father    Coronary artery disease Brother    Alzheimer's disease Maternal Grandmother    Colon cancer Maternal Grandfather     Social  History   Socioeconomic History   Marital status: Married    Spouse name: Avanell Bob   Number of children: 1   Years of education: Not on file   Highest education level: Some college, no degree  Occupational History   Occupation: retired    Comment: Electronics engineer  Tobacco Use   Smoking status: Every Day    Current packs/day: 0.50    Average packs/day: 0.5 packs/day for 65.3 years (32.7 ttl pk-yrs)    Types: Cigarettes    Start date: 1960   Smokeless tobacco: Never   Tobacco comments:    1/2 3/4 ppd (since age 43)  Vaping Use  Vaping status: Never Used  Substance and Sexual Activity   Alcohol use: Not Currently    Comment: occasionally, "none   Drug use: No   Sexual activity: Yes    Partners: Female  Other Topics Concern   Not on file  Social History Narrative   Not on file   Social Drivers of Health   Financial Resource Strain: Low Risk  (09/06/2023)   Received from [facility]   Overall Financial Resource Strain (CARDIA)    Difficulty of Paying Living Expenses: Not hard at all  Food Insecurity: No Food Insecurity (09/06/2023)   Received from [facility]   Hunger Vital Sign    Worried About Running Out of Food in the Last Year: Never true    Ran Out of Food in the Last Year: Never true  Transportation Needs: No Transportation Needs (09/06/2023)   Received from [facility] - Transportation    In the past 12 months, has lack of transportation kept you from medical appointments or from getting medications?: No    Lack of Transportation (Non-Medical): No  Physical Activity: Unknown (09/03/2023)   Exercise Vital Sign    Days of Exercise per Week: Patient declined    Minutes of Exercise per Session: 0 min  Stress: Patient Declined (09/03/2023)   [employer] of Occupational Health - Occupational Stress Questionnaire    Feeling of Stress : Patient declined  Social Connections: Unknown  (09/03/2023)   Social Connection and Isolation Panel [NHANES]    Frequency of Communication with Friends and Family: More than three times a week    Frequency of Social Gatherings with Friends and Family: Once a week    Attends Religious Services: Patient declined    Database administrator or Organizations: No    Attends Banker Meetings: Never    Marital Status: Married     Review of Systems: A 12 point ROS discussed and pertinent positives are indicated in the HPI above.  All other systems are negative.  Review of Systems  Vital Signs: BP (!) 152/81   Pulse 64   Temp 97.9 F (36.6 C)   Resp 16   SpO2 94%   Advance Care Plan: The advanced care plan/surrogate decision maker was discussed at the time of visit and the patient did not wish to discuss or was not able to name a surrogate decision maker or provide an advance care plan.    Physical Exam Constitutional:      General: He is not in acute distress.    Appearance: Normal appearance. He is normal weight.  HENT:     Head: Normocephalic and atraumatic.  Eyes:     General: No scleral icterus. Cardiovascular:     Rate and Rhythm: Normal rate.  Pulmonary:     Effort: Pulmonary effort is normal.  Abdominal:     General: There is no distension.     Palpations: Abdomen is soft.     Tenderness: There is no abdominal tenderness.  Musculoskeletal:       Back:     Comments: Mild TTP at T12 spinous process TTP in the lower lumbar paraspinal muscles   Skin:    General: Skin is warm and dry.  Neurological:     Mental Status: He is alert and oriented to person, place, and time.  Psychiatric:        Mood and Affect: Mood normal.  Behavior: Behavior normal.       Imaging: IR Radiologist Eval & Mgmt Result Date: 11/11/2023 EXAM: NEW PATIENT OFFICE VISIT CHIEF COMPLAINT: SEE NOTE IN EPIC HISTORY OF PRESENT ILLNESS: SEE NOTE IN EPIC REVIEW OF SYSTEMS: SEE NOTE IN EPIC PHYSICAL EXAMINATION: SEE NOTE IN  EPIC ASSESSMENT AND PLAN: SEE NOTE IN EPIC Electronically Signed   By: Fernando Hoyer M.D.   On: 11/11/2023 11:16    Labs:  CBC: Recent Labs    02/01/23 1520  WBC 10.0  HGB 14.9  HCT 43.5  PLT 253    COAGS: No results for input(s): "INR", "APTT" in the last 8760 hours.  BMP: Recent Labs    12/16/22 1457 12/24/22 1117 02/01/23 1519 05/19/23 1328 06/08/23 1505 09/16/23 1507  NA 132* 130* 135 135 134* 140  K 4.2 4.6 4.9 4.4 4.5 5.4*  CL 102 99 100 103 103 103  CO2 20* 22 22 25 25 20   GLUCOSE 105* 101* 75 109* 88 111*  BUN 25* 17 14 18 16 16   CALCIUM  9.2 9.0 9.6 9.0 8.9 9.6  CREATININE 1.44* 0.99 1.03 1.05 0.97 0.96  GFRNONAA 49* >60  --  >60 >60  --     LIVER FUNCTION TESTS: Recent Labs    02/01/23 1519 06/08/23 1505  BILITOT 0.7 0.9  AST 23 24  ALT 16 20  ALKPHOS 96 89  PROT 7.0 7.7  ALBUMIN 4.3 4.0    TUMOR MARKERS: No results for input(s): "AFPTM", "CEA", "CA199", "CHROMGRNA" in the last 8760 hours.  Assessment and Plan:  Pleasant 83 year-old man with a subacute T12 compression fracture, likely osteoporotic (DEXA pending).  He has exacerbated chronic back pain which progresses throughout the day and is quite disruptive at night.   His pain is as high as 8/10 on a 10 point scale and he scored 16/24 on the [employer] Disability Questionnaire.    His pain has been slowly improving but seems to have stalled.  He is leaning toward pursuing cement augmentation but is unsure.  1.) Complete DEXA tomorrow 2.) Follow-up with Lucetta Russel as planned to discuss 3.) He will call us  to let us  know if he wants to proceed with kyphoplasty.   Thank you for this interesting consult.  I greatly enjoyed meeting Brian [name] and look forward to participating in their care.  A copy of this report was sent to the requesting provider on this date.  Electronically Signed: Roxie Cord 11/11/2023, 12:38 PM   I spent a total of 40 Minutes  in face to face in  clinical consultation, greater than 50% of which was counseling/coordinating care for T12 compression fracture

## 2023-11-11 NOTE — Telephone Encounter (Signed)
 LOV 06/07/23 back pain, acute since [facility] 12/06/23 LRF

## 2023-11-11 NOTE — Telephone Encounter (Signed)
 Disregard previous dates LOV 09/14/23 NOV 12/06/23 LRF 10/14/23 120 x 0

## 2023-11-12 DIAGNOSIS — M810 Age-related osteoporosis without current pathological fracture: Secondary | ICD-10-CM | POA: Diagnosis not present

## 2023-11-12 LAB — CBC WITH DIFFERENTIAL/PLATELET
Absolute Lymphocytes: 2107 {cells}/uL (ref 850–3900)
Absolute Monocytes: 1095 {cells}/uL — ABNORMAL HIGH (ref 200–950)
Basophils Absolute: 74 {cells}/uL (ref 0–200)
Basophils Relative: 0.8 %
Eosinophils Absolute: 221 {cells}/uL (ref 15–500)
Eosinophils Relative: 2.4 %
HCT: 46.1 % (ref 38.5–50.0)
Hemoglobin: 15.4 g/dL (ref 13.2–17.1)
MCH: 29.8 pg (ref 27.0–33.0)
MCHC: 33.4 g/dL (ref 32.0–36.0)
MCV: 89.2 fL (ref 80.0–100.0)
MPV: 9.5 fL (ref 7.5–12.5)
Monocytes Relative: 11.9 %
Neutro Abs: 5704 {cells}/uL (ref 1500–7800)
Neutrophils Relative %: 62 %
Platelets: 249 10*3/uL (ref 140–400)
RBC: 5.17 10*6/uL (ref 4.20–5.80)
RDW: 12.4 % (ref 11.0–15.0)
Total Lymphocyte: 22.9 %
WBC: 9.2 10*3/uL (ref 3.8–10.8)

## 2023-11-12 LAB — COMPLETE METABOLIC PANEL WITHOUT GFR
AG Ratio: 1.4 (calc) (ref 1.0–2.5)
ALT: 14 U/L (ref 9–46)
AST: 22 U/L (ref 10–35)
Albumin: 4.4 g/dL (ref 3.6–5.1)
Alkaline phosphatase (APISO): 115 U/L (ref 35–144)
BUN: 13 mg/dL (ref 7–25)
CO2: 23 mmol/L (ref 20–32)
Calcium: 9.5 mg/dL (ref 8.6–10.3)
Chloride: 102 mmol/L (ref 98–110)
Creat: 0.96 mg/dL (ref 0.70–1.22)
Globulin: 3.1 g/dL (ref 1.9–3.7)
Glucose, Bld: 92 mg/dL (ref 65–99)
Potassium: 4.3 mmol/L (ref 3.5–5.3)
Sodium: 136 mmol/L (ref 135–146)
Total Bilirubin: 0.7 mg/dL (ref 0.2–1.2)
Total Protein: 7.5 g/dL (ref 6.1–8.1)

## 2023-11-12 LAB — CP4508-PT/INR AND PTT
INR: 1
Prothrombin Time: 10.4 s (ref 9.0–11.5)
aPTT: 28 s (ref 23–32)

## 2023-11-17 DIAGNOSIS — H4051X3 Glaucoma secondary to other eye disorders, right eye, severe stage: Secondary | ICD-10-CM | POA: Diagnosis not present

## 2023-11-17 DIAGNOSIS — Z961 Presence of intraocular lens: Secondary | ICD-10-CM | POA: Diagnosis not present

## 2023-11-17 DIAGNOSIS — H1031 Unspecified acute conjunctivitis, right eye: Secondary | ICD-10-CM | POA: Diagnosis not present

## 2023-11-18 NOTE — Progress Notes (Signed)
   Telephone Visit- Progress Note: Referring Physician:  No referring provider defined for this encounter.  Primary Physician:  Mazie Speed, MD  This visit was performed via telephone.  Patient location: home Provider location: office  I spent a total of 15 minutes non-face-to-face activities for this visit on the date of this encounter including review of current clinical condition and response to treatment.    Patient has given verbal consent to this telephone visits and we reviewed the limitations of a telephone visit. Patient wishes to proceed.    Chief Complaint:  discuss kyphoplasty  History of Present Illness: Brian [name] is a 83 y.o. male has a history of ischemic bowel disease, MI, HTN, CAD, ischemic cardiomyopathy, history of PE, sleep apnea, hyperlipidemia, chronic pain syndrome, skin CA.    Last seen by me on 11/02/23 for T12 compression fracture that likely occurred around 09/09/23. He was still having significant pain and was referred to IR for kyphoplasty.   He wanted to discuss this further so phone visit was scheduled.   He continues with constant mid to lower back pain that gets worse as the day progresses. By evening his pain is an 8-10/10. He's not getting any sharp pain, but it's more dull in nature. No leg pain. No numbness, tingling, or weakness in his legs.    He has been on oxycodone  long term from his PCP. He is taking prn baclofen  from PCP as well. PCP started him on fosamax  after his compression fracture.    He is caregiver for his wife.    He smokes 1/2 PPD x 60+ years. He recently quit.    Surgery: no spinal surgeries   The symptoms are causing a significant impact on the patient's life.    Exam: No exam done as this was a telephone encounter.     Imaging: DEXA scan on 11/12/23 showed osteoporosis.    Assessment and Plan: Brian [name] has known T12 compression fracture that likely happened around 09/09/23.   He continues with  constant mid to lower back pain that gets worse as the day progresses. By evening his pain is an 8-10/10. No leg pain. No new numbness, tingling, or weakness in his legs.   History of right drop foot years ago and he still has weakness on exam today.    He also has known underlying lumbar scoliosis, spondylosis, and multilevel foraminal stenosis.    Current pain continues to likely be from T12 compression fracture. DEXA showed osteoporosis- was started on fosamax  by PCP after fracture.   Treatment options discussed with patient and following plan made:   - He would like to proceed with kyphoplasty procedure at T12 with IR. Message to Dr. Marne Sings.  - In the interim, he will continue with TLSO brace when up and walking. No bending, twisting, or lifting.  - Continue on oxycodone  and baclofen  from PCP.  - Continue fosamax  per PCP as well.  - Will plan to follow up/regroup after kyphoplasty procedure.   Brian Russel PA-C Neurosurgery

## 2023-11-19 ENCOUNTER — Encounter: Payer: Self-pay | Admitting: Orthopedic Surgery

## 2023-11-19 ENCOUNTER — Other Ambulatory Visit: Payer: Self-pay | Admitting: Interventional Radiology

## 2023-11-19 ENCOUNTER — Ambulatory Visit (INDEPENDENT_AMBULATORY_CARE_PROVIDER_SITE_OTHER): Admitting: Orthopedic Surgery

## 2023-11-19 DIAGNOSIS — M810 Age-related osteoporosis without current pathological fracture: Secondary | ICD-10-CM | POA: Diagnosis not present

## 2023-11-19 DIAGNOSIS — M48061 Spinal stenosis, lumbar region without neurogenic claudication: Secondary | ICD-10-CM | POA: Diagnosis not present

## 2023-11-19 DIAGNOSIS — S22080D Wedge compression fracture of T11-T12 vertebra, subsequent encounter for fracture with routine healing: Secondary | ICD-10-CM | POA: Diagnosis not present

## 2023-11-19 DIAGNOSIS — M4186 Other forms of scoliosis, lumbar region: Secondary | ICD-10-CM | POA: Diagnosis not present

## 2023-11-19 DIAGNOSIS — M47816 Spondylosis without myelopathy or radiculopathy, lumbar region: Secondary | ICD-10-CM

## 2023-11-19 DIAGNOSIS — S22080A Wedge compression fracture of T11-T12 vertebra, initial encounter for closed fracture: Secondary | ICD-10-CM

## 2023-11-22 DIAGNOSIS — R29898 Other symptoms and signs involving the musculoskeletal system: Secondary | ICD-10-CM | POA: Diagnosis not present

## 2023-11-22 DIAGNOSIS — G629 Polyneuropathy, unspecified: Secondary | ICD-10-CM | POA: Diagnosis not present

## 2023-11-22 DIAGNOSIS — R42 Dizziness and giddiness: Secondary | ICD-10-CM | POA: Diagnosis not present

## 2023-11-22 DIAGNOSIS — R2689 Other abnormalities of gait and mobility: Secondary | ICD-10-CM | POA: Diagnosis not present

## 2023-11-24 ENCOUNTER — Telehealth: Payer: Self-pay

## 2023-11-24 NOTE — Discharge Instructions (Signed)

## 2023-11-25 ENCOUNTER — Ambulatory Visit
Admission: RE | Admit: 2023-11-25 | Discharge: 2023-11-25 | Disposition: A | Discharge status: Home / Self Care | Source: Ambulatory Visit | Attending: Interventional Radiology | Admitting: Interventional Radiology

## 2023-11-25 DIAGNOSIS — S22080A Wedge compression fracture of T11-T12 vertebra, initial encounter for closed fracture: Secondary | ICD-10-CM

## 2023-11-25 DIAGNOSIS — M8008XA Age-related osteoporosis with current pathological fracture, vertebra(e), initial encounter for fracture: Secondary | ICD-10-CM | POA: Diagnosis not present

## 2023-11-25 HISTORY — PX: IR KYPHO THORACIC WITH BONE BIOPSY: IMG5518

## 2023-11-25 MED ORDER — LIDOCAINE HCL 1 % IJ SOLN
10.0000 mL | Freq: Once | INTRAMUSCULAR | Status: AC
  Administered 2023-11-25: 10 mL via INTRADERMAL

## 2023-11-25 MED ORDER — MIDAZOLAM HCL 2 MG/2ML IJ SOLN: 1.0000 mg | INTRAMUSCULAR | Status: DC | PRN

## 2023-11-25 MED ORDER — FENTANYL CITRATE PF 50 MCG/ML IJ SOSY: 25.0000 ug | PREFILLED_SYRINGE | INTRAMUSCULAR | Status: DC | PRN

## 2023-11-25 MED ORDER — ACETAMINOPHEN 10 MG/ML IV SOLN
1000.0000 mg | Freq: Once | INTRAVENOUS | Status: AC
  Administered 2023-11-25: 1000 mg via INTRAVENOUS

## 2023-11-25 MED ORDER — MIDAZOLAM HCL 5 MG/5ML IJ SOLN: INTRAMUSCULAR | Status: DC | PRN

## 2023-11-25 MED ORDER — SODIUM CHLORIDE 0.9 % IV SOLN
INTRAVENOUS | Status: DC
Start: 2023-11-25 — End: 2023-11-26

## 2023-11-25 MED ORDER — MIDAZOLAM HCL 2 MG/2ML IJ SOLN
INTRAMUSCULAR | Status: DC | PRN
  Administered 2023-11-25 (×4): 1 mg via INTRAVENOUS

## 2023-11-25 MED ORDER — CEFAZOLIN SODIUM-DEXTROSE 2-4 GM/100ML-% IV SOLN
2.0000 g | INTRAVENOUS | Status: AC
  Administered 2023-11-25: 2 g via INTRAVENOUS

## 2023-11-25 MED ORDER — FENTANYL CITRATE (PF) 100 MCG/2ML IJ SOLN
INTRAMUSCULAR | Status: DC | PRN
Start: 2023-11-25 — End: 2023-11-26
  Administered 2023-11-25 (×2): 50 ug via INTRAVENOUS

## 2023-11-29 ENCOUNTER — Telehealth: Payer: Self-pay

## 2023-11-30 ENCOUNTER — Other Ambulatory Visit

## 2023-11-30 DIAGNOSIS — H4051X3 Glaucoma secondary to other eye disorders, right eye, severe stage: Secondary | ICD-10-CM | POA: Diagnosis not present

## 2023-12-02 ENCOUNTER — Telehealth: Payer: Self-pay

## 2023-12-06 ENCOUNTER — Encounter: Payer: Self-pay | Admitting: Family Medicine

## 2023-12-06 ENCOUNTER — Telehealth: Payer: Self-pay | Admitting: Orthopedic Surgery

## 2023-12-06 ENCOUNTER — Ambulatory Visit: Payer: Medicare Other | Admitting: Family Medicine

## 2023-12-06 VITALS — BP 113/83 | HR 68 | Temp 97.9°F | Ht 70.0 in | Wt 162.0 lb

## 2023-12-06 DIAGNOSIS — G894 Chronic pain syndrome: Secondary | ICD-10-CM | POA: Diagnosis not present

## 2023-12-06 DIAGNOSIS — M545 Low back pain, unspecified: Secondary | ICD-10-CM | POA: Diagnosis not present

## 2023-12-06 DIAGNOSIS — R7303 Prediabetes: Secondary | ICD-10-CM

## 2023-12-06 DIAGNOSIS — I890 Lymphedema, not elsewhere classified: Secondary | ICD-10-CM

## 2023-12-06 DIAGNOSIS — I10 Essential (primary) hypertension: Secondary | ICD-10-CM | POA: Diagnosis not present

## 2023-12-06 DIAGNOSIS — E7800 Pure hypercholesterolemia, unspecified: Secondary | ICD-10-CM | POA: Diagnosis not present

## 2023-12-06 MED ORDER — OXYCODONE HCL 10 MG PO TABS: 10.0000 mg | ORAL_TABLET | Freq: Four times a day (QID) | ORAL | 0 refills | Status: DC | PRN

## 2023-12-06 NOTE — Assessment & Plan Note (Signed)
 Previously well controlled Continue statin Repeat FLP and CMP Goal LDL < 70

## 2023-12-06 NOTE — Assessment & Plan Note (Signed)
 Blood pressure is well-controlled on current medications. - Continue amlodipine 10 mg daily - Continue hydralazine 25 mg three times a day as needed

## 2023-12-06 NOTE — Assessment & Plan Note (Signed)
 Chronic pain post-kyphoplasty Pain from previous fracture improved post-kyphoplasty, but new pain above the hip area persists. Awaiting MRI to evaluate new pain. Oxycodone  discussed for pain management; baclofen  not providing noticeable relief. - F/u imaging per NSG - Refill oxycodone  prescription. - Consider discontinuation of baclofen  if no improvement.

## 2023-12-06 NOTE — Telephone Encounter (Signed)
 Yes, please schedule him a follow up with me to discuss.

## 2023-12-06 NOTE — Assessment & Plan Note (Signed)
 Monitoring of prediabetes status. Discussion of dietary habits, particularly around holidays, and his impact on blood sugar levels. - Order A1c test to monitor status. - Discuss dietary modifications to manage blood sugar levels.

## 2023-12-06 NOTE — Telephone Encounter (Signed)
 Thoughts?   Do you want to see him to evaluate?

## 2023-12-06 NOTE — Telephone Encounter (Signed)
 Patient is calling to let our office know that he had his procedure with Dr. Loel Ring at [facility] and is having no pain in that area but is having lower back and hip pain. He states that he was advised by Dr. Loel Ring to contact our office to see if Loyce Ruffini can order an MRI of his lower back and hips. Please advise.

## 2023-12-06 NOTE — Progress Notes (Signed)
 Established patient visit   Patient: Brian [name]   DOB: April 08, 1941   83 y.o. Male  MRN: [id number] Visit Date: 12/06/2023  Today's healthcare provider: Aden Agreste, MD   Chief Complaint  Patient presents with   Back Pain    Patient reports thoracic pain resolved since his kyphoplasty which was done at [facility]. They are recommending he get an MRI through the neurosurgeon office.   Lumber and hip pain is progressively worse throughout the day.     Subjective    Back Pain   HPI     Back Pain    Additional comments: Patient reports thoracic pain resolved since his kyphoplasty which was done at [facility]. They are recommending he get an MRI through the neurosurgeon office.   Lumber and hip pain is progressively worse throughout the day.        Last edited by Desanto, Elena D, CMA on 12/06/2023  2:49 PM.       Discussed the use of AI scribe software for clinical note transcription with the patient, who gave verbal consent to proceed.  History of Present Illness   Brian OSE "[place]" is an 83 year old male who presents with persistent lower back pain and leg swelling.  After kyphoplasty, he experiences a new type of lower back pain above the hip area, extending laterally. He awaits an MRI for further evaluation. Leg swelling has been present for eight to ten years, worsening in the last month. Without compression socks, significant swelling and pain occur, affecting both legs. Previous Lasix  use was ineffective, and ultrasound ruled out clots.  He experiences occasional dizziness and lightheadedness without vertigo. His gait is unsteady, often requiring sidestepping. Further tests, including a brain MRI and EMG, are under consideration.  He manages prediabetes with dietary changes and is due for a recheck of A1c, cholesterol, and kidney levels. Morning cough with heavy phlegm is attributed to early emphysema. He avoids Breo Ellipta  due to concerns about glaucoma and heart  issues, and a rescue inhaler has not been beneficial. Mucinex has been used for phlegm.  He takes oxycodone  for pain management and is considering discontinuing baclofen  due to lack of relief.         Medications: Outpatient Medications Prior to Visit  Medication Sig   albuterol  (VENTOLIN  HFA) 108 (90 Base) MCG/ACT inhaler Inhale 2 puffs into the lungs every 6 (six) hours as needed for wheezing or shortness of breath.   alendronate  (FOSAMAX ) 70 MG tablet Take 1 tablet (70 mg total) by mouth every 7 (seven) days. Take with a full glass of water  on an empty stomach.   amLODipine  (NORVASC ) 10 MG tablet Take 1 tablet (10 mg total) by mouth daily.   aspirin  81 MG tablet Take 81 mg by mouth at bedtime.   atorvastatin  (LIPITOR) 40 MG tablet TAKE 1 TABLET BY MOUTH AT  BEDTIME   baclofen  (LIORESAL ) 10 MG tablet Take 1 tablet (10 mg total) by mouth 3 (three) times daily. TAKE 1 TABLET(10 MG) BY MOUTH THREE TIMES DAILY AS NEEDED FOR MUSCLE SPASMS   Cholecalciferol (VITAMIN D3) 50 MCG (2000 UT) CAPS Take by mouth daily.   Coenzyme Q10 (COQ10) 100 MG CAPS Take 1 capsule by mouth daily.     furosemide  (LASIX ) 20 MG tablet Take 1 tablet (20 mg) by mouth once daily as needed for swelling   meclizine  (ANTIVERT ) 25 MG tablet Take 1 tablet (25 mg total) by mouth 2 (two) times daily  as needed for dizziness.   metroNIDAZOLE  (FLAGYL ) 500 MG tablet Take 1 tablet (500 mg total) by mouth 2 (two) times daily.   Multiple Vitamin (MULTIVITAMIN PO) Take 1 tablet by mouth daily.   nitroGLYCERIN  (NITROSTAT ) 0.4 MG SL tablet Place 1 tablet (0.4 mg total) under the tongue every 5 (five) minutes as needed for chest pain.   saccharomyces boulardii (FLORASTOR) 250 MG capsule Take 250 mg by mouth 2 (two) times daily.   [DISCONTINUED] Fluticasone Furoate-Vilanterol (BREO ELLIPTA ) 50-25 MCG/ACT AEPB Inhale 1 puff into the lungs daily.   [DISCONTINUED] Oxycodone  HCl 10 MG TABS Take 1-2 tablets (10-20 mg total) by mouth every 6  (six) hours as needed (moderate to severe pain).   hydrALAZINE  (APRESOLINE ) 25 MG tablet Take 1 tablet (25 mg total) by mouth 3 (three) times daily. Take as needed for blood pressure greater then 140.   Facility-Administered Medications Prior to Visit  Medication Dose Route Frequency Provider   albuterol  (PROVENTIL ) (2.5 MG/3ML) 0.083% nebulizer solution 2.5 mg  2.5 mg Nebulization Once Stoneking, Hal, MD    Review of Systems  Musculoskeletal:  Positive for back pain.       Objective    BP 113/83 (BP Location: Right Arm, Patient Position: Sitting, Cuff Size: Normal)   Pulse 68   Temp 97.9 F (36.6 C) (Oral)   Ht 5\' 10"  (1.778 m)   Wt 162 lb (73.5 kg)   SpO2 95%   BMI 23.24 kg/m    Physical Exam Vitals reviewed.  Constitutional:      General: He is not in acute distress.    Appearance: Normal appearance. He is not diaphoretic.  HENT:     Head: Normocephalic and atraumatic.  Eyes:     General: No scleral icterus.    Conjunctiva/sclera: Conjunctivae normal.  Cardiovascular:     Rate and Rhythm: Normal rate and regular rhythm.     Heart sounds: Normal heart sounds. No murmur heard. Pulmonary:     Effort: Pulmonary effort is normal. No respiratory distress.     Breath sounds: Normal breath sounds. No wheezing or rhonchi.  Musculoskeletal:     Cervical back: Neck supple.     Right lower leg: Edema (R>L) present.     Left lower leg: Edema present.  Lymphadenopathy:     Cervical: No cervical adenopathy.  Skin:    General: Skin is warm and dry.     Findings: No rash.  Neurological:     Mental Status: He is alert and oriented to person, place, and time. Mental status is at baseline.  Psychiatric:        Mood and Affect: Mood normal.        Behavior: Behavior normal.      No results found for any visits on 12/06/23.  Assessment & Plan     Problem List Items Addressed This Visit       Cardiovascular and Mediastinum   Hypertension   Blood pressure is  well-controlled on current medications. - Continue amlodipine  10 mg daily - Continue hydralazine  25 mg three times a day as needed      Relevant Orders   Comprehensive metabolic panel with GFR     Other   Hyperlipidemia   Previously well controlled Continue statin Repeat FLP and CMP Goal LDL < 70      Relevant Orders   Comprehensive metabolic panel with GFR   Lipid panel   Low back pain   Relevant Medications   Oxycodone  HCl 10  MG TABS   Chronic pain syndrome   Chronic pain post-kyphoplasty Pain from previous fracture improved post-kyphoplasty, but new pain above the hip area persists. Awaiting MRI to evaluate new pain. Oxycodone  discussed for pain management; baclofen  not providing noticeable relief. - F/u imaging per NSG - Refill oxycodone  prescription. - Consider discontinuation of baclofen  if no improvement.      Relevant Medications   Oxycodone  HCl 10 MG TABS   Prediabetes - Primary   Monitoring of prediabetes status. Discussion of dietary habits, particularly around holidays, and his impact on blood sugar levels. - Order A1c test to monitor status. - Discuss dietary modifications to manage blood sugar levels.       Relevant Orders   Hemoglobin A1c   Other Visit Diagnoses       Lymphedema       Relevant Orders   Ambulatory referral to Vascular Surgery           Lymphedema Chronic swelling in the right leg, now affecting the left leg. Compression socks provide some relief, but swelling has worsened over the last month. Previous use of Lasix  was ineffective, indicating non-fluid related edema. No clots found on ultrasound. Lymphedema suspected, possibly related to past surgeries. - Refer to vascular specialists for further evaluation and management. - Discuss potential use of lymphedema pumps and physical therapy.  Dizziness and gait instability Intermittent dizziness and gait instability. Previous evaluation indicated need for further testing, including  potential brain MRI and EMG. Concerns about unsteadiness and potential underlying neurological issues. - Consider brain MRI and EMG for further evaluation. - Discuss potential referral to physical therapy for gait training and balance improvement.  Emphysema Early-stage emphysema with symptoms of morning cough and thick phlegm. Concerns about potential side effects of Breo Ellipta , particularly related to glaucoma and heart issues. Benefits of Breo Ellipta  in managing symptoms and preventing exacerbations discussed. Mucinex discussed for managing morning phlegm. - Consider trial of Breo Ellipta  for a couple of months to assess symptom improvement. - Discuss use of Mucinex for morning phlegm management.      Return in about 6 weeks (around 01/17/2024) for chronic disease f/u.       I personally spent a total of 40 minutes in the care of the patient today including preparing to see the patient, getting/reviewing separately obtained history, performing a medically appropriate exam/evaluation, counseling and educating, placing orders, referring and communicating with other health care professionals, documenting clinical information in the EHR, independently interpreting results, and coordinating care.   Aden Agreste, MD  [facility] [phone] (phone) [phone] (fax)  [facility]

## 2023-12-07 ENCOUNTER — Encounter (INDEPENDENT_AMBULATORY_CARE_PROVIDER_SITE_OTHER): Payer: Self-pay

## 2023-12-07 ENCOUNTER — Ambulatory Visit: Payer: Self-pay | Admitting: Family Medicine

## 2023-12-07 LAB — COMPREHENSIVE METABOLIC PANEL WITH GFR
ALT: 16 IU/L (ref 0–44)
AST: 18 IU/L (ref 0–40)
Albumin: 4.2 g/dL (ref 3.7–4.7)
Alkaline Phosphatase: 124 IU/L — ABNORMAL HIGH (ref 44–121)
BUN/Creatinine Ratio: 19 (ref 10–24)
BUN: 16 mg/dL (ref 8–27)
Bilirubin Total: 0.3 mg/dL (ref 0.0–1.2)
CO2: 22 mmol/L (ref 20–29)
Calcium: 9.9 mg/dL (ref 8.6–10.2)
Chloride: 103 mmol/L (ref 96–106)
Creatinine, Ser: 0.86 mg/dL (ref 0.76–1.27)
Globulin, Total: 2.5 g/dL (ref 1.5–4.5)
Glucose: 93 mg/dL (ref 70–99)
Potassium: 5.3 mmol/L — ABNORMAL HIGH (ref 3.5–5.2)
Sodium: 141 mmol/L (ref 134–144)
Total Protein: 6.7 g/dL (ref 6.0–8.5)
eGFR: 86 mL/min/{1.73_m2} (ref >59)

## 2023-12-07 LAB — LIPID PANEL
Chol/HDL Ratio: 2.5 ratio (ref 0.0–5.0)
Cholesterol, Total: 101 mg/dL (ref 100–199)
HDL: 40 mg/dL (ref >39)
LDL Chol Calc (NIH): 29 mg/dL (ref 0–99)
Triglycerides: 200 mg/dL — ABNORMAL HIGH (ref 0–149)
VLDL Cholesterol Cal: 32 mg/dL (ref 5–40)

## 2023-12-07 LAB — HEMOGLOBIN A1C
Est. average glucose Bld gHb Est-mCnc: 126 mg/dL
Hgb A1c MFr Bld: 6 % — ABNORMAL HIGH (ref 4.8–5.6)

## 2023-12-07 NOTE — Telephone Encounter (Signed)
 Appt 12/14/2023

## 2023-12-10 NOTE — Progress Notes (Unsigned)
 Referring Physician:  Mazie Speed, MD [address] [place],  [place] [address]  Primary Physician:  Mazie Speed, MD  History of Present Illness: [name] has a history of ischemic bowel disease, MI, HTN, CAD, ischemic cardiomyopathy, history of PE, sleep apnea, hyperlipidemia, chronic pain syndrome, skin CA.   He has known T12 compression fracture that likely occurred around 09/09/23. He had kyphoplasty procedure at T12 on 11/25/23 with improvement in pain.   He also has known underlying lumbar scoliosis, spondylosis, and multilevel foraminal stenosis.   Pain from T12 is better, but he now has lower back and hip pain. He is here for repeat evaluation.   Get updated lumbar xrays to check T12?***     He was to wear TLSO brace when up and out of bed. He is here for follow up.   He is wearing his brace. He has constant mid to lower back pain that is more to the left. Pain is minimal in the morning and worse as the day progresses. No radiation to his ribs.  He still has significant pain in the evening. No leg pain. Left sided rib pain is better.   He notes numbness in right ring/small finger that started about 4 weeks ago. No weakness noted.   He has some numbness in knees down to feet that is chronic. He has chronic weakness in right foot that is chronic.   He has been on oxycodone  long term from his PCP. He is taking prn baclofen  from PCP as well. PCP started him on fosamax  after his compression fracture.   He is caregiver for his wife.   He smokes 1/2 PPD x 60+ years. He recently quit.   Bowel or bladder issues: none  Surgery:  Kyphoplasty T12 on 11/25/23  The symptoms are causing a significant impact on the patient's life.   Review of Systems:  A 10 point review of systems is negative, except for the pertinent positives and negatives detailed in the HPI.  Past Medical History: Past Medical History:  Diagnosis Date   AAA (abdominal  aortic aneurysm) (HCC)    Adenomatous polyp    Anxiety    Carpal tunnel syndrome on left    Cervical spine disease    Colitis    Coronary artery disease    S/p anterior MI tx with PCI in 1998  //  Myoview  6/22: EF 45, ant and septal scar; no ischemia; intermediate risk // Echocardiogram 7/22: EF 45-50, ant-sept and apical HK, Gr 1 DD, normal RVSF, mild MR   Deviated septum    Hyperlipidemia    Hypertension    Renal Artery US  12/2022: no RAS bilaterally   Ischemic cardiomyopathy 05/19/2022   Echocardiogram 06/02/22: EF 45-50, global HK, Gr 1 DD, NL RVSF, NL PASP (RVSP 26.4), trivial MR, RAP 3   Low back pain    Myocardial infarction (HCC) 1998   Personal history of pulmonary embolism    Pulmonary embolism (HCC) 08/26/2022   Sleep apnea    CPAP   Wears dentures    full upper    Past Surgical History: Past Surgical History:  Procedure Laterality Date   APPENDECTOMY     AQUEOUS SHUNT Right 10/26/2022   Procedure: AHMED TUBE SHUNT WITH TUTOPLAST RIGHT;  Surgeon: Rosa College, MD;  Location: [facility];  Service: Ophthalmology;  Laterality: Right;   bunions     CATARACT EXTRACTION     COLONOSCOPY WITH PROPOFOL  N/A 02/19/2015  Procedure: COLONOSCOPY WITH PROPOFOL ;  Surgeon: Marnee Sink, MD;  Location: [facility];  Service: Endoscopy;  Laterality: N/A;   COLONOSCOPY WITH PROPOFOL  N/A 06/05/2019   Procedure: COLONOSCOPY WITH BIOPSY;  Surgeon: Marnee Sink, MD;  Location: [facility];  Service: Endoscopy;  Laterality: N/A;  sleep apnea   ERCP N/A 08/25/2022   Procedure: ENDOSCOPIC RETROGRADE CHOLANGIOPANCREATOGRAPHY (ERCP);  Surgeon: Marnee Sink, MD;  Location: [facility] END[facility]COPY;  Service: Endoscopy;  Laterality: N/A;   HEMORRHOID BANDING     HERNIA REPAIR     x3   IR KYPHO THORACIC WITH BONE BIOPSY  11/25/2023   IR RADIOLOGIST EVAL & MGMT  11/11/2023   OLECRANON BURSECTOMY Right    POLYPECTOMY N/A 06/05/2019   Procedure: POLYPECTOMY;  Surgeon: Marnee Sink, MD;   Location: [facility];  Service: Endoscopy;  Laterality: N/A;    Allergies: Allergies as of 12/14/2023   (No Known Allergies)    Medications: Outpatient Encounter Medications as of 12/14/2023  Medication Sig   albuterol  (VENTOLIN  HFA) 108 (90 Base) MCG/ACT inhaler Inhale 2 puffs into the lungs every 6 (six) hours as needed for wheezing or shortness of breath.   alendronate  (FOSAMAX ) 70 MG tablet Take 1 tablet (70 mg total) by mouth every 7 (seven) days. Take with a full glass of water  on an empty stomach.   amLODipine  (NORVASC ) 10 MG tablet Take 1 tablet (10 mg total) by mouth daily.   aspirin  81 MG tablet Take 81 mg by mouth at bedtime.   atorvastatin  (LIPITOR) 40 MG tablet TAKE 1 TABLET BY MOUTH AT  BEDTIME   baclofen  (LIORESAL ) 10 MG tablet Take 1 tablet (10 mg total) by mouth 3 (three) times daily. TAKE 1 TABLET(10 MG) BY MOUTH THREE TIMES DAILY AS NEEDED FOR MUSCLE SPASMS   Cholecalciferol (VITAMIN D3) 50 MCG (2000 UT) CAPS Take by mouth daily.   Coenzyme Q10 (COQ10) 100 MG CAPS Take 1 capsule by mouth daily.     furosemide  (LASIX ) 20 MG tablet Take 1 tablet (20 mg) by mouth once daily as needed for swelling   hydrALAZINE  (APRESOLINE ) 25 MG tablet Take 1 tablet (25 mg total) by mouth 3 (three) times daily. Take as needed for blood pressure greater then 140.   meclizine  (ANTIVERT ) 25 MG tablet Take 1 tablet (25 mg total) by mouth 2 (two) times daily as needed for dizziness.   metroNIDAZOLE  (FLAGYL ) 500 MG tablet Take 1 tablet (500 mg total) by mouth 2 (two) times daily.   Multiple Vitamin (MULTIVITAMIN PO) Take 1 tablet by mouth daily.   nitroGLYCERIN  (NITROSTAT ) 0.4 MG SL tablet Place 1 tablet (0.4 mg total) under the tongue every 5 (five) minutes as needed for chest pain.   Oxycodone  HCl 10 MG TABS Take 1-2 tablets (10-20 mg total) by mouth every 6 (six) hours as needed (moderate to severe pain).   saccharomyces boulardii (FLORASTOR) 250 MG capsule Take 250 mg by mouth 2 (two)  times daily.   Facility-Administered Encounter Medications as of 12/14/2023  Medication   albuterol  (PROVENTIL ) (2.5 MG/3ML) 0.083% nebulizer solution 2.5 mg    Social History: Social History   Tobacco Use   Smoking status: Every Day    Current packs/day: 0.50    Average packs/day: 0.5 packs/day for 65.4 years (32.7 ttl pk-yrs)    Types: Cigarettes    Start date: 1960   Smokeless tobacco: Never   Tobacco comments:    1/2 3/4 ppd (since age 72)  Vaping Use   Vaping status: Never  Used  Substance Use Topics   Alcohol use: Not Currently    Comment: occasionally, "none   Drug use: No    Family Medical History: Family History  Problem Relation Age of Onset   Heart failure Mother    Kidney disease Father    Hypertension Father    Coronary artery disease Brother    Alzheimer's disease Maternal Grandmother    Colon cancer Maternal Grandfather     Physical Examination: There were no vitals filed for this visit.  Awake, alert, oriented to person, place, and time.  Speech is clear and fluent. Fund of knowledge is appropriate.   Cranial Nerves: Pupils equal round and reactive to light.  Facial tone is symmetric.    Mild tenderness TL junction.   No abnormal lesions on exposed skin.   Strength: Side Biceps Triceps Deltoid Interossei Grip Wrist Ext. Wrist Flex.  R 5 5 5 5 5 5 5   L 5 5 5 5 5 5 5    Side Iliopsoas Quads Hamstring PF DF EHL  R 5 5 5 5 3 3   L 5 5 5 5 5 5    Right foot drop is chronic per patient.   Reflexes are 2+ and symmetric at the biceps, brachioradialis, patella and achilles, but right achilles is diminished.    Bilateral upper and lower extremity sensation is intact to light touch, slightly diminished in both legs from knees down to feet.   Gait is slow.   Medical Decision Making  Imaging: none  Assessment and Plan: Mr. Abrahamsen has known T12 compression fracture that likely happened around 09/09/23.   He has constant mid to lower back pain that  is more to the left. Pain is minimal in the morning and worse as the day progresses. No radiation to his ribs.  He still has significant pain in the evening. No leg pain. Left sided rib pain is better.   History of right drop foot years ago and he still has weakness on exam today.   He notes 4 weeks of numbness in right ring and small finger. No neck or arm pain.   He also has known underlying lumbar scoliosis, spondylosis, and multilevel foraminal stenosis.   Current pain continues to likely be from T12 compression fracture. This appears to be grossly stable compared to previous xrays.   Treatment options discussed with patient and following plan made:   - He is still having significant back pain in the evenings. Referral to IR to discuss possible kyphoplasty.  - Continue with TLSO brace when up and walking. No bending, twisting, or lifting.  - Continue on oxycodone  and baclofen  from PCP.  - Will watch numbness/tingling in right hand for now since it's only been going for a month. If this persists, may consider EMG.  - Will call him a few days after he sees IR to regroup and decide on follow up.   I spent a total of 30 minutes in face-to-face and non-face-to-face activities related to this patient's care today including review of outside records, review of imaging, review of symptoms, physical exam, discussion of differential diagnosis, discussion of treatment options, and documentation.   Lucetta Russel PA-C Dept. of Neurosurgery

## 2023-12-14 ENCOUNTER — Ambulatory Visit: Admitting: Orthopedic Surgery

## 2023-12-14 ENCOUNTER — Encounter: Payer: Self-pay | Admitting: Orthopedic Surgery

## 2023-12-14 VITALS — BP 124/78 | Ht 70.0 in | Wt 165.0 lb

## 2023-12-14 DIAGNOSIS — M419 Scoliosis, unspecified: Secondary | ICD-10-CM

## 2023-12-14 DIAGNOSIS — M48061 Spinal stenosis, lumbar region without neurogenic claudication: Secondary | ICD-10-CM

## 2023-12-14 DIAGNOSIS — M47816 Spondylosis without myelopathy or radiculopathy, lumbar region: Secondary | ICD-10-CM

## 2023-12-14 NOTE — Patient Instructions (Signed)
 It was so nice to see you today. Thank you so much for coming in.    You have some wear and tear in your back and this is likely causing your lower back pain.   I want you to see physical medicine and rehab at the [facility] to discuss possible injections in your lower back. Dr. Erman Hayward, Dr. Aleen Ammons, and their PA Laurina Popper are great and will take good care of you. They should call you to schedule an appointment or you can call them at [phone].   We may consider PT for your back at some point, but will hold off for now.   I will see you back in 6-8 weeks. Please do not hesitate to call if you have any questions or concerns. You can also message me in MyChart.   Lucetta Russel PA-C [phone]     The physicians and staff at [facility] at [facility] are committed to providing excellent care. You may receive a survey asking for feedback about your experience at our office. We value you your feedback and appreciate you taking the time to to fill it out. The [facility] leadership team is also available to discuss your experience in person, feel free to contact us  [phone].

## 2023-12-15 ENCOUNTER — Encounter: Payer: Self-pay | Admitting: Internal Medicine

## 2023-12-15 ENCOUNTER — Ambulatory Visit: Payer: Medicare Other | Admitting: Internal Medicine

## 2023-12-15 VITALS — BP 120/60 | HR 64 | Temp 98.3°F | Ht 66.0 in | Wt 161.4 lb

## 2023-12-15 DIAGNOSIS — F17210 Nicotine dependence, cigarettes, uncomplicated: Secondary | ICD-10-CM | POA: Diagnosis not present

## 2023-12-15 DIAGNOSIS — J439 Emphysema, unspecified: Secondary | ICD-10-CM | POA: Diagnosis not present

## 2023-12-15 DIAGNOSIS — F17200 Nicotine dependence, unspecified, uncomplicated: Secondary | ICD-10-CM

## 2023-12-15 DIAGNOSIS — J449 Chronic obstructive pulmonary disease, unspecified: Secondary | ICD-10-CM

## 2023-12-15 DIAGNOSIS — G4733 Obstructive sleep apnea (adult) (pediatric): Secondary | ICD-10-CM | POA: Diagnosis not present

## 2023-12-15 NOTE — Progress Notes (Signed)
 Name: Brian [name] MRN: [id number] DOB: 1941-06-22    SYNOPSIS Patient was admitted to the ICU  for E. coli sepsis with metabolic encephalopathy with acute liver failure patient underwent ERCP for gallbladder disease found to have sludge There is residual imbalance Patient does have some shortness of breath and dyspnea on exertion Patient is a longtime smoker 1 pack a day for the last 70 years Patient quit tobacco abuse last week Patient had a history of coronary artery disease with 2 stents placed Previously had been seeing Dr. Felipe Horton cardiology in [place] Patient also on oral anticoagulation for diagnosis of PE    CHIEF COMPLAINT:  Follow-up assessment for OSA Follow-up assessment for emphysema  HISTORY OF PRESENT ILLNESS: Follow-up assessment for OSA Patient is very compliant with his CPAP His previous AHI was 9.9 and I increase his CPAP prescription to 9 cm of water  pressure Since increasing his CPAP to 9 patient is AHI reduced to 8, ALTHOUGH THIS IS VERY MINIMAL CHANGE HE IS VERY HAPPY WITH SETTINGS AND CPAP THERAPY    Discussed sleep data and reviewed with patient.  Encouraged proper weight management.  Discussed driving precautions and its relationship with hypersomnolence.  Discussed sleep hygiene, and benefits of a fixed sleep waked time.  The importance of getting eight or more hours of sleep discussed with patient.  Discussed limiting the use of the computer and television before bedtime.  Decrease naps during the day, so night time sleep will become enhanced.  Limit caffeine, and sleep deprivation.   Patient uses and benefits from therapy Using CPAP nightly and with naps Pressure setting is comfortable and is sleeping well. much more satisfied with this improvement Compliance report reviewed in detail with patient Patient is feeling well overall Patient uses and benefits from therapy Patient now has a mask that fits well   PAIN ISSUES Patient  currently has a T12 spinal fracture I have explained to him the pain could inhibit sleep quality Patient is currently on oxycodone  I have also explained that pain could hinder breathing I recommended breathing exercises Since he smokes he has intermittent cough Will provide albuterol  as needed  History of CAD No exacerbation at this time No evidence of heart failure at this time No evidence or signs of infection at this time No respiratory distress No fevers, chills, nausea, vomiting, diarrhea No evidence of lower extremity edema No evidence hemoptysis   Emphysema on CT scan No significant respiratory issues at this time Patient is not on any inhalers Patient is very active for his age  HISTORY OF PE Patient with a history of pulmonary embolism Weaned off of anticoagulation   PAST MEDICAL HISTORY :   has a past medical history of AAA (abdominal aortic aneurysm) (HCC), Adenomatous polyp, Anxiety, Carpal tunnel syndrome on left, Cervical spine disease, Colitis, Coronary artery disease, Deviated septum, Hyperlipidemia, Hypertension, Ischemic cardiomyopathy (05/19/2022), Low back pain, Myocardial infarction (HCC) (1998), Personal history of pulmonary embolism, Pulmonary embolism (HCC) (08/26/2022), Sleep apnea, and Wears dentures.  has a past surgical history that includes Appendectomy; Hernia repair; Hemorrhoid banding; bunions; Cataract extraction; Colonoscopy with propofol  (N/A, 02/19/2015); Colonoscopy with propofol  (N/A, 06/05/2019); polypectomy (N/A, 06/05/2019); ERCP (N/A, 08/25/2022); Olecranon bursectomy (Right); Aqueous shunt (Right, 10/26/2022); IR Radiologist Eval & Mgmt (11/11/2023); and IR KYPHO THORACIC WITH BONE BIOPSY (11/25/2023). Prior to Admission medications   Medication Sig Start Date End Date Taking? Authorizing Provider  apixaban  (ELIQUIS ) 5 MG TABS tablet Take 2 tablets (10 mg total) by mouth 2 (two)  times daily for 7 days, THEN 1 tablet (5 mg total) 2 (two) times  daily. 08/29/22 11/27/22  Tiajuana Fluke, MD  aspirin  81 MG tablet Take 81 mg by mouth at bedtime.    [provider]  atorvastatin  (LIPITOR) 40 MG tablet Take 40 mg by mouth at bedtime.    [provider]  benzonatate  (TESSALON ) 200 MG capsule Take 1 capsule (200 mg total) by mouth 3 (three) times daily as needed for cough. 08/29/22   Tiajuana Fluke, MD  ciprofloxacin  (CIPRO ) 500 MG tablet Take 1 tablet (500 mg total) by mouth 2 (two) times daily for 11 days. 08/29/22 09/09/22  Tiajuana Fluke, MD  Coenzyme Q10 (COQ10) 100 MG CAPS Take 1 capsule by mouth daily.      [provider]  latanoprost  (XALATAN ) 0.005 % ophthalmic solution Place 1 drop into the right eye at bedtime. 03/04/22   [provider]  lisinopril  (ZESTRIL ) 20 MG tablet Take 1 tablet (20 mg total) by mouth daily. Patient taking differently: Take 20 mg by mouth at bedtime. 07/22/22   Arty Binning, MD  meclizine  (ANTIVERT ) 25 MG tablet Take 1 tablet (25 mg total) by mouth 2 (two) times daily as needed for dizziness. 08/29/22   Tiajuana Fluke, MD  metoprolol  succinate (TOPROL -XL) 25 MG 24 hr tablet TAKE 1 TABLET(25 MG) BY MOUTH DAILY Patient taking differently: Take 25 mg by mouth at bedtime. 08/18/22   Marlyse Single T, PA-C  Multiple Vitamin (MULTIVITAMIN PO) Take 1 tablet by mouth daily.    [provider]  nitroGLYCERIN  (NITROSTAT ) 0.4 MG SL tablet Place 0.4 mg under the tongue every 5 (five) minutes as needed for chest pain.    [provider]  Oxycodone  HCl 10 MG TABS Take 10-20 mg by mouth every 6 (six) hours as needed (moderate to severe pain).    [provider]   No Known Allergies  FAMILY HISTORY:  family history includes Alzheimer's disease in his maternal grandmother; Colon cancer in his maternal grandfather; Coronary artery disease in his brother; Heart failure in his mother; Hypertension in his father; Kidney disease in his father. SOCIAL  HISTORY:  reports that he has been smoking cigarettes. He started smoking about 65 years ago. He has a 32.7 pack-year smoking history. He has never used smokeless tobacco. He reports that he does not currently use alcohol. He reports that he does not use drugs.   BP 120/60 (BP Location: Right Arm, Patient Position: Sitting, Cuff Size: Normal)   Pulse 64   Temp 98.3 F (36.8 C) (Oral)   Ht 5\' 6"  (1.676 m) Comment: measured in clinic -- extensive spinal deformities.  Wt 161 lb 6.4 oz (73.2 kg)   SpO2 94%   BMI 26.05 kg/m      Review of Systems: Gen:  Denies  fever, sweats, chills weight loss  HEENT: Denies blurred vision, double vision, ear pain, eye pain, hearing loss, nose bleeds, sore throat Cardiac:  No dizziness, chest pain or heaviness, chest tightness,edema, No JVD Resp:   No cough, -sputum production, -shortness of breath,-wheezing, -hemoptysis,  Other:  All other systems negative   Physical Examination:   General Appearance: No distress  EYES PERRLA, EOM intact.   NECK Supple, No JVD Pulmonary: normal breath sounds, No wheezing.  CardiovascularNormal S1,S2.  No m/r/g.   Abdomen: Benign, Soft, non-tender. Neurology UE/LE 5/5 strength, no focal deficits Ext pulses intact, cap refill intact ALL OTHER ROS ARE NEGATIVE   ASSESSMENT  AND PLAN SYNOPSIS 83 year old pleasant white male seen today for follow-up assessment for multiple medical issues including diagnosis of obstructive sleep apnea diagnosed 10 years ago with history of coronary artery disease and myocardial infarction with cardiac stents with previous admission to the ICU for E. coli sepsis and metabolic encephalopathy with a long standing history of pulmonary embolism with ongoing tobacco abuse now with acute spinal fracture    Assessment of OSA Previous AHI 10 Continue CPAP as prescribed  Excellent compliance report Reviewed compliance report in detail with patient Patient definitely benefits the use of  CPAP therapy as prescribed Using CPAP nightly and with naps Pressure setting is comfortable and is sleeping well. CPAP prescription 9 AHI reduced to 8  No evidence of acute heart failure at this time No respiratory distress No fevers, chills, nausea, vomiting, diarrhea No evidence hemoptysis  Patient Instructions Continue to use CPAP every night, minimum of 4-6 hours a night.  Change equipment every 30 days or as directed by DME.  Wash your tubing with warm soap and water  daily, hang to dry. Wash humidifier portion weekly. Use bottled, distilled water  and change daily   Be aware of reduced alertness and do not drive or operate heavy machinery if experiencing this or drowsiness.  Exercise encouraged, as tolerated. Encouraged proper weight management.  Important to get eight or more hours of sleep  Limiting the use of the computer and television before bedtime.  Decrease naps during the day, so night time sleep will become enhanced.  Limit caffeine, and sleep deprivation.  HTN, stroke, uncontrolled diabetes and heart failure are potential risk factors.  Risk of untreated sleep apnea including cardiac arrhthymias, stroke, DM, pulm HTN.    Assessment of emphysema No maintenance inhalers at this time Patient doing well over all, does  NOT feel he needs maintainace therapy Albuterol  used 4 times in last 6 months No exacerbation at this time No evidence of heart failure at this time No evidence or signs of infection at this time No respiratory distress No fevers, chills, nausea, vomiting, diarrhea No evidence of lower extremity edema No evidence hemoptysis Patient with a history of glaucoma therefore will avoid long-acting muscarinic antagonist   Assessment pulmonary embolism Patient no longer on anticoagulation therapy  History of coronary artery disease Follow-up with cardiology  CT of the head shows white matter disease Patient with imbalance issues Follow-up neurology  Deconditioned state -Recommend increased daily activity and exercise  Spinal fracture Recommend pain control Recommend breathing exercises to avoid atelectasis  Smoking Assessment and Cessation Counseling Upon further questioning, Patient smokes 1/2 ppd I have advised patient to quit/stop smoking as soon as possible due to high risk for multiple medical problems Patient is willing to quit smoking  I have advised patient that we can assist and have options of Nicotine replacement therapy. I also advised patient on behavioral therapy and can provide oral medication therapy in conjunction with the other therapies Follow up next Office visit  for assessment of smoking cessation Smoking cessation counseling advised for >10 minutes    MEDICATION ADJUSTMENTS/LABS AND TESTS ORDERED: Continue CPAP to 9 cm water  pressure Please stop smoking Avoid Allergens and Irritants Avoid secondhand smoke Avoid SICK contacts Recommend  Masking  when appropriate Recommend Keep up-to-date with vaccinations Can use albuterol  2 puffs prior to sleeping and placing on CPAP  CURRENT MEDICATIONS REVIEWED AT LENGTH WITH PATIENT TODAY   Patient  satisfied with Plan of action and management. All questions answered  Follow up 6  months  Total Time Spent  46 mins   Lady Pier, M.D.  [facility]  Medical Director [facility] Medical Director [facility] Cardio-Pulmonary Department

## 2023-12-15 NOTE — Patient Instructions (Signed)
 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  Continue CPAP as prescribed  Patient Instructions Continue to use CPAP every night, minimum of 4-6 hours a night.  Change equipment every 30 days or as directed by DME.  Wash your tubing with warm soap and water  daily, hang to dry. Wash humidifier portion weekly. Use bottled, distilled water  and change daily   Be aware of reduced alertness and do not drive or operate heavy machinery if experiencing this or drowsiness.  Exercise encouraged, as tolerated. Encouraged proper weight management.  Important to get eight or more hours of sleep  Limiting the use of the computer and television before bedtime.  Decrease naps during the day, so night time sleep will become enhanced.  Limit caffeine, and sleep deprivation.    Avoid Allergens and Irritants Avoid secondhand smoke Avoid SICK contacts Recommend  Masking  when appropriate Recommend Keep up-to-date with vaccinations   Please stop smoking Can use albuterol  2 puffs prior to sleeping and placing on CPAP

## 2023-12-21 DIAGNOSIS — M5441 Lumbago with sciatica, right side: Secondary | ICD-10-CM | POA: Diagnosis not present

## 2023-12-21 DIAGNOSIS — M5442 Lumbago with sciatica, left side: Secondary | ICD-10-CM | POA: Diagnosis not present

## 2023-12-21 DIAGNOSIS — G8929 Other chronic pain: Secondary | ICD-10-CM | POA: Diagnosis not present

## 2023-12-21 DIAGNOSIS — M5416 Radiculopathy, lumbar region: Secondary | ICD-10-CM | POA: Diagnosis not present

## 2023-12-22 ENCOUNTER — Encounter (INDEPENDENT_AMBULATORY_CARE_PROVIDER_SITE_OTHER): Payer: Self-pay | Admitting: Vascular Surgery

## 2023-12-22 ENCOUNTER — Ambulatory Visit (INDEPENDENT_AMBULATORY_CARE_PROVIDER_SITE_OTHER): Admitting: Vascular Surgery

## 2023-12-22 VITALS — BP 120/75 | HR 64 | Resp 18 | Ht 66.0 in | Wt 164.6 lb

## 2023-12-22 DIAGNOSIS — R7303 Prediabetes: Secondary | ICD-10-CM | POA: Diagnosis not present

## 2023-12-22 DIAGNOSIS — R600 Localized edema: Secondary | ICD-10-CM | POA: Diagnosis not present

## 2023-12-22 DIAGNOSIS — I10 Essential (primary) hypertension: Secondary | ICD-10-CM | POA: Diagnosis not present

## 2023-12-23 DIAGNOSIS — H0289 Other specified disorders of eyelid: Secondary | ICD-10-CM | POA: Diagnosis not present

## 2023-12-23 DIAGNOSIS — H4051X3 Glaucoma secondary to other eye disorders, right eye, severe stage: Secondary | ICD-10-CM | POA: Diagnosis not present

## 2023-12-23 DIAGNOSIS — H04123 Dry eye syndrome of bilateral lacrimal glands: Secondary | ICD-10-CM | POA: Diagnosis not present

## 2023-12-23 DIAGNOSIS — Z961 Presence of intraocular lens: Secondary | ICD-10-CM | POA: Diagnosis not present

## 2024-01-03 ENCOUNTER — Encounter (INDEPENDENT_AMBULATORY_CARE_PROVIDER_SITE_OTHER): Payer: Self-pay | Admitting: Vascular Surgery

## 2024-01-03 DIAGNOSIS — M5416 Radiculopathy, lumbar region: Secondary | ICD-10-CM | POA: Diagnosis not present

## 2024-01-03 DIAGNOSIS — R7303 Prediabetes: Secondary | ICD-10-CM | POA: Diagnosis not present

## 2024-01-03 NOTE — Progress Notes (Addendum)
 Subjective:    Patient ID: Brian [name], male    DOB: 08/23/1940, 83 y.o.   MRN: [id number] Chief Complaint  Patient presents with   New Patient (Initial Visit)    Ref [facility] consult lymphedema    Brian [name] is an 83 yo male who presents to clinic today with a chief complaint of bilateral lower extremity edema worsening and becoming painful.  Patient endorses he has a long history of right foot drop which started 12 years ago.  He is basically numb from his knee down.  He has had prior spine surgery at L3, 4, and 5 for scoliosis and fusion surgery for compression fractures at T12, L1-S1.  He endorses that his right foot is has been swollen for a long time now.  He also endorses having hammertoe surgery to his second toe and bunion surgery as well he also has a history of a broken ankle from 15 years ago.  He endorses having ultrasounds of his bilateral lower extremities in November 2020 for which he states were negative for any DVTs or reflux at that time.  He endorses he feels his lower extremities are getting more swollen and starting to give him pain.  He has tried conventional therapy in the past but he has not been consistently compliant with it.  He states he has an old pair of compression socks.  He wishes to try to attempt conservative therapy again today to help improve his lower extremity swelling.    Review of Systems  Constitutional: Negative.   Cardiovascular:  Positive for leg swelling.  Musculoskeletal:  Positive for gait problem.       Patient has a history of foot drop from 12 years ago with numbness from his knee down to his toes on the right side.  He has also had multiple foot surgeries and broken his ankle 15 years ago.  All other systems reviewed and are negative.      Objective:   Physical Exam Vitals reviewed.  Constitutional:      Appearance: Normal appearance. He is normal weight.  HENT:     Head: Normocephalic.   Eyes:     Pupils: Pupils are  equal, round, and reactive to light.    Cardiovascular:     Rate and Rhythm: Normal rate and regular rhythm.     Pulses: Normal pulses.     Heart sounds: Normal heart sounds.  Pulmonary:     Effort: Pulmonary effort is normal.     Breath sounds: Normal breath sounds.  Abdominal:     General: Abdomen is flat.     Palpations: Abdomen is soft.   Musculoskeletal:        General: Swelling present.     Right lower leg: Edema present.     Left lower leg: Edema present.     Comments: Patient has had multiple prior foot surgeries including hammertoe surgery to his second toe and bunion surgery.  He is also had a history of a broken ankle.   Skin:    Capillary Refill: Capillary refill takes more than 3 seconds.   Neurological:     General: No focal deficit present.     Mental Status: He is alert and oriented to person, place, and time. Mental status is at baseline.     Sensory: Sensory deficit present.     Motor: Weakness present.     Gait: Gait abnormal.     Comments: Patient noted to have a sensory deficit  with right foot drop with numbness from his knee down to his toes and his right lower extremity.  Is also had multiple back surgeries for scoliosis and compression fractures which have left him weak on the right side with an abnormal gait due to the foot drop and sensory deficit.  Psychiatric:        Mood and Affect: Mood normal.        Behavior: Behavior normal.        Thought Content: Thought content normal.        Judgment: Judgment normal.     BP 120/75   Pulse 64   Resp 18   Ht 5' 6 (1.676 m)   Wt 164 lb 9.6 oz (74.7 kg)   BMI 26.57 kg/m   Past Medical History:  Diagnosis Date   AAA (abdominal aortic aneurysm) (HCC)    Adenomatous polyp    Anxiety    Carpal tunnel syndrome on left    Cervical spine disease    Colitis    Coronary artery disease    S/p anterior MI tx with PCI in 1998  //  Myoview  6/22: EF 45, ant and septal scar; no ischemia; intermediate risk //  Echocardiogram 7/22: EF 45-50, ant-sept and apical HK, Gr 1 DD, normal RVSF, mild Brian   Deviated septum    Hyperlipidemia    Hypertension    Renal Artery US  12/2022: no RAS bilaterally   Ischemic cardiomyopathy 05/19/2022   Echocardiogram 06/02/22: EF 45-50, global HK, Gr 1 DD, NL RVSF, NL PASP (RVSP 26.4), trivial Brian, RAP 3   Low back pain    Myocardial infarction (HCC) 1998   Personal history of pulmonary embolism    Pulmonary embolism (HCC) 08/26/2022   Sleep apnea    CPAP   Wears dentures    full upper    Social History   Socioeconomic History   Marital status: Married    Spouse name: [name]   Number of children: 1   Years of education: Not on file   Highest education level: Some college, no degree  Occupational History   Occupation: retired    Comment: Electronics engineer  Tobacco Use   Smoking status: Every Day    Current packs/day: 0.50    Average packs/day: 0.5 packs/day for 65.5 years (32.7 ttl pk-yrs)    Types: Cigarettes    Start date: 1960   Smokeless tobacco: Never   Tobacco comments:    1/2 - 1/4 ppd (since age 66) 12/15/23  Vaping Use   Vaping status: Never Used  Substance and Sexual Activity   Alcohol use: Not Currently    Comment: occasionally, none   Drug use: No   Sexual activity: Yes    Partners: Female  Other Topics Concern   Not on file  Social History Narrative   Not on file   Social Drivers of Health   Financial Resource Strain: Low Risk  (12/21/2023)   Received [facility]   Overall Financial Resource Strain (CARDIA)    Difficulty of Paying Living Expenses: Not hard at all  Food Insecurity: No Food Insecurity (12/21/2023)   Received from [facility]   Hunger Vital Sign    Within the past 12 months, you worried that your food would run out before you got the money to buy more.: Never true    Within the past 12 months, the food you bought just didn't last and you didn't have money to  get more.: Never true  Transportation Needs: No Transportation Needs (12/21/2023)   Received from [facility] - Transportation    In the past 12 months, has lack of transportation kept you from medical appointments or from getting medications?: No    Lack of Transportation (Non-Medical): No  Physical Activity: Unknown (09/03/2023)   Exercise Vital Sign    Days of Exercise per Week: Patient declined    Minutes of Exercise per Session: 0 min  Stress: Patient Declined (09/03/2023)   [employer] [facility] [employer] - Occupational Stress Questionnaire    Feeling of Stress : Patient declined  Social Connections: Unknown (09/03/2023)   Social Connection and Isolation Panel    Frequency of Communication with Friends and Family: More than three times a week    Frequency of Social Gatherings with Friends and Family: Once a week    Attends Religious Services: Patient declined    Database administrator or Organizations: No    Attends Banker Meetings: Never    Marital Status: Married  Catering manager Violence: Not At Risk (05/25/2023)   Humiliation, Afraid, Rape, and Kick questionnaire    Fear of Current or Ex-Partner: No    Emotionally Abused: No    Physically Abused: No    Sexually Abused: No    Past Surgical History:  Procedure Laterality Date   APPENDECTOMY     AQUEOUS SHUNT Right 10/26/2022   Procedure: AHMED TUBE SHUNT WITH TUTOPLAST RIGHT;  Surgeon: Myrna Adine Anes, MD;  Location: [facility];  Service: Ophthalmology;  Laterality: Right;   bunions     CATARACT EXTRACTION     COLONOSCOPY WITH PROPOFOL  N/A 02/19/2015   Procedure: COLONOSCOPY WITH PROPOFOL ;  Surgeon: Rogelia Copping, MD;  Location: [facility];  Service: Endoscopy;  Laterality: N/A;   COLONOSCOPY WITH PROPOFOL  N/A 06/05/2019   Procedure: COLONOSCOPY WITH BIOPSY;  Surgeon: Copping Rogelia, MD;  Location: [facility];  Service: Endoscopy;  Laterality: N/A;  sleep  apnea   ERCP N/A 08/25/2022   Procedure: ENDOSCOPIC RETROGRADE CHOLANGIOPANCREATOGRAPHY (ERCP);  Surgeon: Copping Rogelia, MD;  Location: [facility];  Service: Endoscopy;  Laterality: N/A;   HEMORRHOID BANDING     HERNIA REPAIR     x3   IR KYPHO THORACIC WITH BONE BIOPSY  11/25/2023   IR RADIOLOGIST EVAL & MGMT  11/11/2023   OLECRANON BURSECTOMY Right    POLYPECTOMY N/A 06/05/2019   Procedure: POLYPECTOMY;  Surgeon: Copping Rogelia, MD;  Location: [facility];  Service: Endoscopy;  Laterality: N/A;    Family History  Problem Relation Age of Onset   Heart failure Mother    Kidney disease Father    Hypertension Father    Coronary artery disease Brother    Alzheimer's disease Maternal Grandmother    Colon cancer Maternal Grandfather     No Known Allergies     Latest Ref Rng & Units 11/10/2023   12:00 AM 02/01/2023    3:20 PM 09/09/2022   12:00 AM  CBC  WBC 3.8 - 10.8 Thousand/uL 9.2  10.0  8.7      Hemoglobin 13.2 - 17.1 g/dL 84.5  85.0  86.5      Hematocrit 38.5 - 50.0 % 46.1  43.5  40      Platelets 140 - 400 Thousand/uL 249  253  446         This result is from an external source.      CMP  Component Value Date/Time   NA 141 12/06/2023 1606   NA 138 02/26/2014 1727   K 5.3 (H) 12/06/2023 1606   K 4.7 02/26/2014 1727   CL 103 12/06/2023 1606   CL 106 02/26/2014 1727   CO2 22 12/06/2023 1606   CO2 26 02/26/2014 1727   GLUCOSE 93 12/06/2023 1606   GLUCOSE 92 11/10/2023 0000   GLUCOSE 83 02/26/2014 1727   BUN 16 12/06/2023 1606   BUN 12 02/26/2014 1727   CREATININE 0.86 12/06/2023 1606   CREATININE 0.96 11/10/2023 0000   CALCIUM  9.9 12/06/2023 1606   CALCIUM  8.9 02/26/2014 1727   PROT 6.7 12/06/2023 1606   PROT 6.9 02/26/2014 1727   ALBUMIN 4.2 12/06/2023 1606   ALBUMIN 3.5 02/26/2014 1727   AST 18 12/06/2023 1606   AST 23 02/26/2014 1727   ALT 16 12/06/2023 1606   ALT 26 02/26/2014 1727   ALKPHOS 124 (H) 12/06/2023 1606   ALKPHOS 91 02/26/2014  1727   BILITOT 0.3 12/06/2023 1606   BILITOT 0.6 02/26/2014 1727   EGFR 86 12/06/2023 1606   GFRNONAA >60 06/08/2023 1505   GFRNONAA >60 02/26/2014 1727     No results found.     Assessment & Plan:   Lymphedema (Primary) Recommend:  I have had a long discussion with the patient regarding swelling and why it  causes symptoms.  Patient will begin wearing graduated compression on a daily basis a prescription was given. The patient will  wear the stockings first thing in the morning and removing them in the evening. The patient is instructed specifically not to sleep in the stockings.   In addition, behavioral modification will be initiated.  This will include frequent elevation, use of over the counter pain medications and exercise such as walking.  Consideration for a lymph pump will also be made based upon the effectiveness of conservative therapy.  This would help to improve the edema control and prevent sequela such as ulcers and infections   Patient completed duplex ultrasound of the venous system in November of 2024 to ensure that DVT or reflux is not present. These were negative for both. Therefore repeat studies would be of no value at this time.  Patient also has an extensive surgical history with right foot drop which is left him with sensory deficits, weakness and abnormal gait which may be contributing to some of the edema in his right lower extremity.  The patient will follow-up with me in 6 months without studies after being compliant with conservative measures.    2. Essential hypertension Continue antihypertensive medications as already ordered, these medications have been reviewed and there are no changes at this time.  3. Prediabetes We discussed in detail today the patient's prediabetic condition.  We discussed in detail diet in order to be able to help control this so it does not worsen over time.  We discussed the possibility of needing medications in the future if he  does not control his sugar intake.  We discussed how this will affect the swelling to his lower extremities.   Current Outpatient Medications on File Prior to Visit  Medication Sig Dispense Refill   albuterol  (VENTOLIN  HFA) 108 (90 Base) MCG/ACT inhaler Inhale 2 puffs into the lungs every 6 (six) hours as needed for wheezing or shortness of breath. 8 g 2   alendronate  (FOSAMAX ) 70 MG tablet Take 1 tablet (70 mg total) by mouth every 7 (seven) days. Take with a full glass of water  on an  empty stomach. 4 tablet 11   amLODipine  (NORVASC ) 10 MG tablet Take 1 tablet (10 mg total) by mouth daily. 90 tablet 0   aspirin  81 MG tablet Take 81 mg by mouth at bedtime.     atorvastatin  (LIPITOR) 40 MG tablet TAKE 1 TABLET BY MOUTH AT  BEDTIME 100 tablet 0   baclofen  (LIORESAL ) 10 MG tablet Take 1 tablet (10 mg total) by mouth 3 (three) times daily. TAKE 1 TABLET(10 MG) BY MOUTH THREE TIMES DAILY AS NEEDED FOR MUSCLE SPASMS 30 tablet 0   Cholecalciferol (VITAMIN D3) 50 MCG (2000 UT) CAPS Take by mouth daily.     Coenzyme Q10 (COQ10) 100 MG CAPS Take 1 capsule by mouth daily.       furosemide  (LASIX ) 20 MG tablet Take 1 tablet (20 mg) by mouth once daily as needed for swelling 30 tablet 3   meclizine  (ANTIVERT ) 25 MG tablet Take 1 tablet (25 mg total) by mouth 2 (two) times daily as needed for dizziness. 30 tablet 0   Multiple Vitamin (MULTIVITAMIN PO) Take 1 tablet by mouth daily.     nitroGLYCERIN  (NITROSTAT ) 0.4 MG SL tablet Place 1 tablet (0.4 mg total) under the tongue every 5 (five) minutes as needed for chest pain. 25 tablet 3   Oxycodone  HCl 10 MG TABS Take 1-2 tablets (10-20 mg total) by mouth every 6 (six) hours as needed (moderate to severe pain). 120 tablet 0   saccharomyces boulardii (FLORASTOR) 250 MG capsule Take 250 mg by mouth 2 (two) times daily.     hydrALAZINE  (APRESOLINE ) 25 MG tablet Take 1 tablet (25 mg total) by mouth 3 (three) times daily. Take as needed for blood pressure greater then  140. 270 tablet 3   Current Facility-Administered Medications on File Prior to Visit  Medication Dose Route Frequency Provider Last Rate Last Admin   albuterol  (PROVENTIL ) (2.5 MG/3ML) 0.083% nebulizer solution 2.5 mg  2.5 mg Nebulization Once Stoneking, Hal, MD        There are no Patient Instructions on file for this visit. No follow-ups on file.   Gwendlyn JONELLE Shank, NP

## 2024-01-04 ENCOUNTER — Encounter: Payer: Self-pay | Admitting: Family Medicine

## 2024-01-04 DIAGNOSIS — G894 Chronic pain syndrome: Secondary | ICD-10-CM

## 2024-01-04 MED ORDER — OXYCODONE HCL 10 MG PO TABS: 10.0000 mg | ORAL_TABLET | Freq: Four times a day (QID) | ORAL | 0 refills | Status: DC | PRN

## 2024-01-05 DIAGNOSIS — H4051X3 Glaucoma secondary to other eye disorders, right eye, severe stage: Secondary | ICD-10-CM | POA: Diagnosis not present

## 2024-01-05 DIAGNOSIS — H34811 Central retinal vein occlusion, right eye, with macular edema: Secondary | ICD-10-CM | POA: Diagnosis not present

## 2024-01-05 DIAGNOSIS — H04123 Dry eye syndrome of bilateral lacrimal glands: Secondary | ICD-10-CM | POA: Diagnosis not present

## 2024-01-10 ENCOUNTER — Encounter (INDEPENDENT_AMBULATORY_CARE_PROVIDER_SITE_OTHER): Payer: Self-pay

## 2024-01-17 DIAGNOSIS — G8929 Other chronic pain: Secondary | ICD-10-CM | POA: Diagnosis not present

## 2024-01-17 DIAGNOSIS — M5441 Lumbago with sciatica, right side: Secondary | ICD-10-CM | POA: Diagnosis not present

## 2024-01-17 DIAGNOSIS — M5442 Lumbago with sciatica, left side: Secondary | ICD-10-CM | POA: Diagnosis not present

## 2024-01-17 DIAGNOSIS — M5416 Radiculopathy, lumbar region: Secondary | ICD-10-CM | POA: Diagnosis not present

## 2024-01-18 ENCOUNTER — Ambulatory Visit (INDEPENDENT_AMBULATORY_CARE_PROVIDER_SITE_OTHER): Admitting: Family Medicine

## 2024-01-18 ENCOUNTER — Encounter: Payer: Self-pay | Admitting: Family Medicine

## 2024-01-18 VITALS — BP 125/79 | HR 69 | Ht 66.0 in | Wt 165.4 lb

## 2024-01-18 DIAGNOSIS — M545 Low back pain, unspecified: Secondary | ICD-10-CM

## 2024-01-18 DIAGNOSIS — G894 Chronic pain syndrome: Secondary | ICD-10-CM

## 2024-01-18 DIAGNOSIS — E875 Hyperkalemia: Secondary | ICD-10-CM | POA: Diagnosis not present

## 2024-01-18 DIAGNOSIS — G8929 Other chronic pain: Secondary | ICD-10-CM

## 2024-01-18 DIAGNOSIS — E7800 Pure hypercholesterolemia, unspecified: Secondary | ICD-10-CM | POA: Diagnosis not present

## 2024-01-18 DIAGNOSIS — R600 Localized edema: Secondary | ICD-10-CM | POA: Diagnosis not present

## 2024-01-18 MED ORDER — ATORVASTATIN CALCIUM 40 MG PO TABS: 40.0000 mg | ORAL_TABLET | Freq: Every day | ORAL | 3 refills | Status: AC

## 2024-01-18 MED ORDER — OXYCODONE HCL 10 MG PO TABS: 10.0000 mg | ORAL_TABLET | Freq: Four times a day (QID) | ORAL | 0 refills | Status: DC | PRN

## 2024-01-18 NOTE — Progress Notes (Signed)
 Established patient visit   Patient: Brian [name]   DOB: 09/11/1940   83 y.o. Male  MRN: [id number] Visit Date: 01/18/2024  Today's healthcare provider: Jon Eva, MD   Chief Complaint  Patient presents with   Medical Management of Chronic Issues    Patient reports taking medications as prescribed.    Pre-Diabetes    No symptoms to report   Hyperlipidemia    Symptoms : slight numbness/tingling in right hand   Hypertension    Symptoms : Lower leg edema    Subjective    Hyperlipidemia  Hypertension   HPI     Medical Management of Chronic Issues    Additional comments: Patient reports taking medications as prescribed.         Pre-Diabetes    Additional comments: No symptoms to report        Hyperlipidemia    Additional comments: Symptoms : slight numbness/tingling in right hand        Hypertension    Additional comments: Symptoms : Lower leg edema       Last edited by Lilian Fitzpatrick, CMA on 01/18/2024  2:52 PM.       Discussed the use of AI scribe software for clinical note transcription with the patient, who gave verbal consent to proceed.  History of Present Illness   Brian [name] is an 83 year old male with glaucoma and emphysema who presents for a follow-up on his potassium levels and swelling in his right foot and ankle.  His potassium levels were slightly elevated in May. He occasionally eats bananas and is open to dietary adjustments if needed.  He experiences significant swelling in his right foot and ankle, unrelieved by compression socks worn for several months. The swelling causes pain and difficulty removing shoes. Vascular surgery approved compression socks, but he questions their effectiveness.  He has glaucoma in his right eye, managed with eye drops that irritate and leave a yellow crust. He sees his ophthalmologist every two to three weeks.  Chronic back pain affects his sleep, limiting rest to about six hours  per night. A recent cortisone injection provided no relief.  He has emphysema and uses a CPAP machine. A rescue inhaler was advised before CPAP use, but he stopped due to lack of improvement. He has a chronic cough and occasional wheezing.  He takes oxycodone , atorvastatin , and amlodipine , managing prescriptions through [address] and [facility].         Medications: Outpatient Medications Prior to Visit  Medication Sig   albuterol  (VENTOLIN  HFA) 108 (90 Base) MCG/ACT inhaler Inhale 2 puffs into the lungs every 6 (six) hours as needed for wheezing or shortness of breath.   alendronate  (FOSAMAX ) 70 MG tablet Take 1 tablet (70 mg total) by mouth every 7 (seven) days. Take with a full glass of water  on an empty stomach.   amLODipine  (NORVASC ) 10 MG tablet Take 1 tablet (10 mg total) by mouth daily.   aspirin  81 MG tablet Take 81 mg by mouth at bedtime.   baclofen  (LIORESAL ) 10 MG tablet Take 1 tablet (10 mg total) by mouth 3 (three) times daily. TAKE 1 TABLET(10 MG) BY MOUTH THREE TIMES DAILY AS NEEDED FOR MUSCLE SPASMS   Cholecalciferol (VITAMIN D3) 50 MCG (2000 UT) CAPS Take by mouth daily.   Coenzyme Q10 (COQ10) 100 MG CAPS Take 1 capsule by mouth daily.     furosemide  (LASIX ) 20 MG tablet Take 1 tablet (20 mg) by mouth  once daily as needed for swelling   hydrALAZINE  (APRESOLINE ) 25 MG tablet Take 1 tablet (25 mg total) by mouth 3 (three) times daily. Take as needed for blood pressure greater then 140.   meclizine  (ANTIVERT ) 25 MG tablet Take 1 tablet (25 mg total) by mouth 2 (two) times daily as needed for dizziness.   Multiple Vitamin (MULTIVITAMIN PO) Take 1 tablet by mouth daily.   nitroGLYCERIN  (NITROSTAT ) 0.4 MG SL tablet Place 1 tablet (0.4 mg total) under the tongue every 5 (five) minutes as needed for chest pain.   saccharomyces boulardii (FLORASTOR) 250 MG capsule Take 250 mg by mouth 2 (two) times daily.   [DISCONTINUED] atorvastatin  (LIPITOR) 40 MG tablet TAKE 1 TABLET BY MOUTH AT   BEDTIME   [DISCONTINUED] Oxycodone  HCl 10 MG TABS Take 1-2 tablets (10-20 mg total) by mouth every 6 (six) hours as needed (moderate to severe pain).   Facility-Administered Medications Prior to Visit  Medication Dose Route Frequency Provider   albuterol  (PROVENTIL ) (2.5 MG/3ML) 0.083% nebulizer solution 2.5 mg  2.5 mg Nebulization Once Stoneking, Hal, MD    Review of Systems     Objective    BP 125/79 (BP Location: Right Arm, Patient Position: Sitting, Cuff Size: Normal)   Pulse 69   Ht 5' 6 (1.676 m)   Wt 165 lb 6.4 oz (75 kg)   BMI 26.70 kg/m    Physical Exam Vitals reviewed.  Constitutional:      General: He is not in acute distress.    Appearance: Normal appearance. He is not diaphoretic.  HENT:     Head: Normocephalic and atraumatic.   Eyes:     General: No scleral icterus.    Conjunctiva/sclera: Conjunctivae normal.    Cardiovascular:     Rate and Rhythm: Normal rate and regular rhythm.     Heart sounds: Normal heart sounds.  Pulmonary:     Effort: Pulmonary effort is normal. No respiratory distress.     Breath sounds: Normal breath sounds. No wheezing or rhonchi.   Musculoskeletal:     Cervical back: Neck supple.     Right lower leg: No edema.     Left lower leg: No edema.  Lymphadenopathy:     Cervical: No cervical adenopathy.   Skin:    General: Skin is warm and dry.   Neurological:     Mental Status: He is alert and oriented to person, place, and time. Mental status is at baseline.   Psychiatric:        Mood and Affect: Mood normal.        Behavior: Behavior normal.      No results found for any visits on 01/18/24.  Assessment & Plan     Problem List Items Addressed This Visit       Other   Low back pain   Relevant Medications   Oxycodone  HCl 10 MG TABS   Chronic pain syndrome - Primary   Relevant Medications   Oxycodone  HCl 10 MG TABS   Other Visit Diagnoses       Serum potassium elevated       Relevant Orders   Basic  Metabolic Panel (BMET)           Edema of right foot and ankle Chronic swelling of the right foot and ankle causing significant discomfort and difficulty removing footwear. Compression socks have been used without significant improvement. Vascular surgery has recommended stronger compression socks. Consideration for a lymphedema pump in the future, pending  documentation of conservative therapy. - Continue wearing compression socks - Consider stronger compression socks as recommended by vascular surgery - Document conservative therapy for potential future lymphedema pump approval  Chronic back pain Chronic back pain with inadequate relief from previous cortisone injection, affecting sleep with current duration around six hours. Scheduled for another injection on July 14. - Proceed with scheduled injection on July 14 - Monitor pain levels and sleep quality post-injection  Chronic pain, opioid management Chronic pain management with oxycodone . Recent refill provided, but pharmacy will not fill early. Ongoing monitoring of pain levels and medication use. - Continue oxycodone  as prescribed - Monitor pain levels and adjust medication as needed  Dysfunction and weakness of right hand Weakness in the right hand with previous physical therapy showing some improvement. Scheduled for further evaluation and potential MRI to assess for underlying causes, including possible past stroke. - Proceed with scheduled evaluation and potential MRI per Neuro - Continue physical therapy for right hand strength  Chronic obstructive pulmonary disease (COPD) with mild emphysema and chronic cough Mild COPD with associated chronic cough and occasional wheezing. Previous consideration of Breo inhaler was declined due to potential side effects exacerbating existing glaucoma and heart issues. Current management includes CPAP use and occasional rescue inhaler use without significant improvement in symptoms. - Continue  CPAP use - Discuss chronic cough and potential inhaler use with pulmonologist at next appointment  Glaucoma, right eye Chronic glaucoma in the right eye, described as severe by the ophthalmologist. Current treatment with eye drops to reduce intraocular pressure, causing irritation and yellow crusting on the skin around the eyes. - Continue follow-up with ophthalmologist - Manage skin irritation with gentle cleansing after eye drop application  Hyperkalemia Previous lab results indicated elevated potassium levels. Dietary intake of high-potassium foods such as bananas and potatoes discussed. - Recheck potassium levels today - Advise on dietary modifications to reduce potassium intake if levels remain elevated  Hyperlipidemia Ongoing management of hyperlipidemia with atorvastatin . Recent issues with medication refills through [facility], leading to excess supply. - Continue atorvastatin  - Coordinate with [facility] for appropriate refill schedule       Return in about 4 months (around 05/20/2024) for CPE.       Jon Eva, MD  [facility] [phone] (phone) [phone] (fax)  [facility]

## 2024-01-19 DIAGNOSIS — H34811 Central retinal vein occlusion, right eye, with macular edema: Secondary | ICD-10-CM | POA: Diagnosis not present

## 2024-01-19 DIAGNOSIS — H4051X3 Glaucoma secondary to other eye disorders, right eye, severe stage: Secondary | ICD-10-CM | POA: Diagnosis not present

## 2024-01-19 LAB — BASIC METABOLIC PANEL WITH GFR
BUN/Creatinine Ratio: 17 (ref 10–24)
BUN: 17 mg/dL (ref 8–27)
CO2: 22 mmol/L (ref 20–29)
Calcium: 10.1 mg/dL (ref 8.6–10.2)
Chloride: 100 mmol/L (ref 96–106)
Creatinine, Ser: 1 mg/dL (ref 0.76–1.27)
Glucose: 93 mg/dL (ref 70–99)
Potassium: 5.4 mmol/L — ABNORMAL HIGH (ref 3.5–5.2)
Sodium: 140 mmol/L (ref 134–144)
eGFR: 75 mL/min/{1.73_m2} (ref >59)

## 2024-01-20 ENCOUNTER — Ambulatory Visit: Payer: Self-pay | Admitting: Family Medicine

## 2024-01-20 DIAGNOSIS — E875 Hyperkalemia: Secondary | ICD-10-CM

## 2024-01-24 ENCOUNTER — Other Ambulatory Visit: Payer: Self-pay | Admitting: Physician Assistant

## 2024-01-27 DIAGNOSIS — I890 Lymphedema, not elsewhere classified: Secondary | ICD-10-CM | POA: Diagnosis not present

## 2024-01-31 DIAGNOSIS — M5416 Radiculopathy, lumbar region: Secondary | ICD-10-CM | POA: Diagnosis not present

## 2024-01-31 DIAGNOSIS — R7303 Prediabetes: Secondary | ICD-10-CM | POA: Diagnosis not present

## 2024-02-02 DIAGNOSIS — G629 Polyneuropathy, unspecified: Secondary | ICD-10-CM | POA: Diagnosis not present

## 2024-02-08 DIAGNOSIS — M5442 Lumbago with sciatica, left side: Secondary | ICD-10-CM | POA: Diagnosis not present

## 2024-02-08 DIAGNOSIS — M5441 Lumbago with sciatica, right side: Secondary | ICD-10-CM | POA: Diagnosis not present

## 2024-02-08 DIAGNOSIS — G8929 Other chronic pain: Secondary | ICD-10-CM | POA: Diagnosis not present

## 2024-02-08 DIAGNOSIS — M6281 Muscle weakness (generalized): Secondary | ICD-10-CM | POA: Diagnosis not present

## 2024-02-11 NOTE — Progress Notes (Signed)
 Referring Physician:  [name], MD [address] [place],  [place] [address]  Primary Physician:  [name], MD  History of Present Illness: [name] has a history of ischemic bowel disease, MI, HTN, CAD, ischemic cardiomyopathy, history of PE, sleep apnea, hyperlipidemia, chronic pain syndrome, skin CA.   He has known T12 compression fracture that likely occurred around 09/09/23. He had kyphoplasty procedure at T12 on 11/25/23 with improvement in pain.   Last seen by me on 12/14/23 with constant LBP and no leg pain. He also has known underlying lumbar scoliosis, spondylosis, and multilevel foraminal stenosis. History of right drop foot years ago and he still has weakness.   He was sent to PMR to consider injections.   Dr. Dodson sent him to PT for his lumbar spine. He had bilateral S1 TF ESI on 01/03/24 with some relief. He had repeat bilateral S1 TF ESI on 01/31/24.   He had initial eval with PT on 02/08/24.   He is here for follow up.   He is about the same. He continues with constant LBP with no leg pain. Pain is worse with standing or walking. Some relief with sitting, but has pain after prolonged sitting. No numbness or tingling. Has known right foot drop x years.   Injections only helped for a few days. He has more PT scheduled for next week.   He continues on oxycodone  and baclofen  from his PCP.   He is caregiver for his wife.   He smokes 1/2 PPD x 60+ years. He recently quit, but smokes intermittently.    Conservative measures:  Physical therapy: initial eval at [facility] on 02/08/24  Injections:  01/31/24 bilateral S1 TF ESI  01/03/24 bilateral S1 TF ESI   Surgery:  Kyphoplasty T12 on 11/25/23  The symptoms are causing a significant impact on the patient's life.   Review of Systems:  A 10 point review of systems is negative, except for the pertinent positives and negatives detailed in the HPI.  Past Medical History: Past Medical  History:  Diagnosis Date   AAA (abdominal aortic aneurysm) (HCC)    Adenomatous polyp    Anxiety    Arthritis    Cancer (HCC) 2022   Carpal tunnel syndrome on left    Cataract    Cervical spine disease    Colitis    Coronary artery disease    S/p anterior MI tx with PCI in 1998  //  Myoview  6/22: EF 45, ant and septal scar; no ischemia; intermediate risk // Echocardiogram 7/22: EF 45-50, ant-sept and apical HK, Gr 1 DD, normal RVSF, mild MR   Deviated septum    Emphysema of lung (HCC) 2024   Glaucoma 2023   Hyperlipidemia    Hypertension    Renal Artery US  12/2022: no RAS bilaterally   Ischemic cardiomyopathy 05/19/2022   Echocardiogram 06/02/22: EF 45-50, global HK, Gr 1 DD, NL RVSF, NL PASP (RVSP 26.4), trivial MR, RAP 3   Low back pain    Myocardial infarction (HCC) 1998   Personal history of pulmonary embolism    Pulmonary embolism (HCC) 08/26/2022   Sleep apnea    CPAP   Wears dentures    full upper    Past Surgical History: Past Surgical History:  Procedure Laterality Date   APPENDECTOMY     AQUEOUS SHUNT Right 10/26/2022   Procedure: AHMED TUBE SHUNT WITH TUTOPLAST RIGHT;  Surgeon: Myrna Adine Anes, MD;  Location: [facility];  Service: Ophthalmology;  Laterality: Right;   bunions     CATARACT EXTRACTION     COLONOSCOPY WITH PROPOFOL  N/A 02/19/2015   Procedure: COLONOSCOPY WITH PROPOFOL ;  Surgeon: Rogelia Copping, MD;  Location: [facility] ENDOSCOPY;  Service: Endoscopy;  Laterality: N/A;   COLONOSCOPY WITH PROPOFOL  N/A 06/05/2019   Procedure: COLONOSCOPY WITH BIOPSY;  Surgeon: Copping Rogelia, MD;  Location: [facility];  Service: Endoscopy;  Laterality: N/A;  sleep apnea   ERCP N/A 08/25/2022   Procedure: ENDOSCOPIC RETROGRADE CHOLANGIOPANCREATOGRAPHY (ERCP);  Surgeon: Copping Rogelia, MD;  Location: [facility] ENDOSCOPY;  Service: Endoscopy;  Laterality: N/A;   EYE SURGERY     HEMORRHOID BANDING     HERNIA REPAIR     x3   IR KYPHO THORACIC WITH BONE BIOPSY   11/25/2023   IR RADIOLOGIST EVAL & MGMT  11/11/2023   OLECRANON BURSECTOMY Right    POLYPECTOMY N/A 06/05/2019   Procedure: POLYPECTOMY;  Surgeon: Copping Rogelia, MD;  Location: [facility];  Service: Endoscopy;  Laterality: N/A;    Allergies: Allergies as of 02/16/2024   (No Known Allergies)    Medications: Outpatient Encounter Medications as of 02/16/2024  Medication Sig   albuterol  (VENTOLIN  HFA) 108 (90 Base) MCG/ACT inhaler Inhale 2 puffs into the lungs every 6 (six) hours as needed for wheezing or shortness of breath.   alendronate  (FOSAMAX ) 70 MG tablet Take 1 tablet (70 mg total) by mouth every 7 (seven) days. Take with a full glass of water  on an empty stomach.   amLODipine  (NORVASC ) 10 MG tablet TAKE 1 TABLET(10 MG) BY MOUTH DAILY   aspirin  81 MG tablet Take 81 mg by mouth at bedtime.   atorvastatin  (LIPITOR) 40 MG tablet Take 1 tablet (40 mg total) by mouth at bedtime.   baclofen  (LIORESAL ) 10 MG tablet Take 1 tablet (10 mg total) by mouth 3 (three) times daily. TAKE 1 TABLET(10 MG) BY MOUTH THREE TIMES DAILY AS NEEDED FOR MUSCLE SPASMS   Cholecalciferol (VITAMIN D3) 50 MCG (2000 UT) CAPS Take by mouth daily.   Coenzyme Q10 (COQ10) 100 MG CAPS Take 1 capsule by mouth daily.     furosemide  (LASIX ) 20 MG tablet Take 1 tablet (20 mg) by mouth once daily as needed for swelling   hydrALAZINE  (APRESOLINE ) 25 MG tablet Take 1 tablet (25 mg total) by mouth 3 (three) times daily. Take as needed for blood pressure greater then 140.   meclizine  (ANTIVERT ) 25 MG tablet Take 1 tablet (25 mg total) by mouth 2 (two) times daily as needed for dizziness.   Multiple Vitamin (MULTIVITAMIN PO) Take 1 tablet by mouth daily.   nitroGLYCERIN  (NITROSTAT ) 0.4 MG SL tablet Place 1 tablet (0.4 mg total) under the tongue every 5 (five) minutes as needed for chest pain.   Oxycodone  HCl 10 MG TABS Take 1-2 tablets (10-20 mg total) by mouth every 6 (six) hours as needed (moderate to severe pain).    saccharomyces boulardii (FLORASTOR) 250 MG capsule Take 250 mg by mouth 2 (two) times daily.   Facility-Administered Encounter Medications as of 02/16/2024  Medication   albuterol  (PROVENTIL ) (2.5 MG/3ML) 0.083% nebulizer solution 2.5 mg    Social History: Social History   Tobacco Use   Smoking status: Every Day    Current packs/day: 0.50    Average packs/day: 0.5 packs/day for 65.6 years (32.8 ttl pk-yrs)    Types: Cigarettes    Start date: 1960   Smokeless tobacco: Never   Tobacco comments:    1/2 -  1/4 ppd (since age 82) 12/15/23  Vaping Use   Vaping status: Never Used  Substance Use Topics   Alcohol use: Not Currently    Comment: occasionally, none   Drug use: No    Family Medical History: Family History  Problem Relation Age of Onset   Heart failure Mother    Kidney disease Father    Hypertension Father    Coronary artery disease Brother    Hypertension Brother    Alzheimer's disease Maternal Grandmother    Colon cancer Maternal Grandfather     Physical Examination: There were no vitals filed for this visit.  Awake, alert, oriented to person, place, and time.  Speech is clear and fluent. Fund of knowledge is appropriate.   Cranial Nerves: Pupils equal round and reactive to light.  Facial tone is symmetric.    No tenderness TL junction. No lower lumbar tenderness.   No abnormal lesions on exposed skin.   Strength: Side Iliopsoas Quads Hamstring PF DF EHL  R 5 5 5 5 3 3   L 5 5 5 5 5 5    Right foot drop is chronic per patient.   Reflexes are 2+ and symmetric at the biceps, brachioradialis, patella and achilles, but right achilles is diminished.     Bilateral lower extremity sensation is intact to light touch, slightly diminished in both legs from knees down to feet.   Gait is slow.   Medical Decision Making  Imaging: none  Assessment and Plan: Brian [name] has known T12 compression fracture that likely occurred around 09/09/23. He had kyphoplasty  procedure at T12 on 11/25/23 with improvement in pain.   He continues with constant LBP with no leg pain. He has chronic right foot drop. No significant improvement with lumbar ESIs.   He has known underlying lumbar scoliosis, spondylosis, and multilevel foraminal stenosis.   Treatment options discussed with patient and following plan made:   - Continue with PT for lumbar spine. Let me know if he wants to change to [facility] in [place].  - Continue on oxycodone  and baclofen  from PCP.  - Follow up with Dr. Dodson as scheduled. May be candidate for facet injections? He had these 10+ years ago with no relief.  - Follow up with  me in 6-8 weeks and prn.   I spent a total of 20 minutes in face-to-face and non-face-to-face activities related to this patient's care today including review of outside records, review of imaging, review of symptoms, physical exam, discussion of differential diagnosis, discussion of treatment options, and documentation.   [name] PA-C Dept. of Neurosurgery

## 2024-02-14 ENCOUNTER — Telehealth: Payer: Self-pay

## 2024-02-14 DIAGNOSIS — G4733 Obstructive sleep apnea (adult) (pediatric): Secondary | ICD-10-CM

## 2024-02-14 NOTE — Telephone Encounter (Signed)
 Copied from CRM [id number]. Topic: General - Other >> Feb 14, 2024  4:17 PM Rilla B wrote: Reason for CRM: Patient call insurance for new seals for CPAP mask, a week and a half ago. [facility] state they would fax over an urgent request to Dr Isaiah to update supplies.  They are requesting a new script for equipment. Please call patient and update.

## 2024-02-15 NOTE — Telephone Encounter (Signed)
 Referral, notes were faxed to [facility] 02/15/24 waiting for Dr. Jacqulyn signature to send the order to [facility]

## 2024-02-16 ENCOUNTER — Ambulatory Visit: Admitting: Orthopedic Surgery

## 2024-02-16 ENCOUNTER — Encounter: Payer: Self-pay | Admitting: Orthopedic Surgery

## 2024-02-16 VITALS — BP 122/72 | Ht 66.0 in | Wt 165.0 lb

## 2024-02-16 DIAGNOSIS — M21371 Foot drop, right foot: Secondary | ICD-10-CM | POA: Diagnosis not present

## 2024-02-16 DIAGNOSIS — M47816 Spondylosis without myelopathy or radiculopathy, lumbar region: Secondary | ICD-10-CM | POA: Diagnosis not present

## 2024-02-16 DIAGNOSIS — S22088D Other fracture of T11-T12 vertebra, subsequent encounter for fracture with routine healing: Secondary | ICD-10-CM | POA: Diagnosis not present

## 2024-02-16 DIAGNOSIS — M4186 Other forms of scoliosis, lumbar region: Secondary | ICD-10-CM

## 2024-02-16 DIAGNOSIS — M419 Scoliosis, unspecified: Secondary | ICD-10-CM

## 2024-02-17 DIAGNOSIS — Z961 Presence of intraocular lens: Secondary | ICD-10-CM | POA: Diagnosis not present

## 2024-02-17 DIAGNOSIS — H4051X3 Glaucoma secondary to other eye disorders, right eye, severe stage: Secondary | ICD-10-CM | POA: Diagnosis not present

## 2024-02-17 DIAGNOSIS — H34811 Central retinal vein occlusion, right eye, with macular edema: Secondary | ICD-10-CM | POA: Diagnosis not present

## 2024-02-18 NOTE — Telephone Encounter (Unsigned)
 Copied from CRM [id number]. Topic: Clinical - Medication Question >> Feb 18, 2024 11:36 AM Rozanna MATSU wrote: Reason for CRM: PT CALLED ABOUT THE CRM [phone] STATED NO ONE HAS CALLED HIM BACK LIKE HE ASKED. STATED THE CPAP IS NOW THROUGH [facility] NOT [facility]. THE PATIENT WOULD LIKE A CALL FROM DR [facility] NURSE.

## 2024-02-21 DIAGNOSIS — M5441 Lumbago with sciatica, right side: Secondary | ICD-10-CM | POA: Diagnosis not present

## 2024-02-21 DIAGNOSIS — M5416 Radiculopathy, lumbar region: Secondary | ICD-10-CM | POA: Diagnosis not present

## 2024-02-21 DIAGNOSIS — M47816 Spondylosis without myelopathy or radiculopathy, lumbar region: Secondary | ICD-10-CM | POA: Diagnosis not present

## 2024-02-21 DIAGNOSIS — G8929 Other chronic pain: Secondary | ICD-10-CM | POA: Diagnosis not present

## 2024-02-21 DIAGNOSIS — M5442 Lumbago with sciatica, left side: Secondary | ICD-10-CM | POA: Diagnosis not present

## 2024-02-22 DIAGNOSIS — M5441 Lumbago with sciatica, right side: Secondary | ICD-10-CM | POA: Diagnosis not present

## 2024-02-22 DIAGNOSIS — G8929 Other chronic pain: Secondary | ICD-10-CM | POA: Diagnosis not present

## 2024-02-22 DIAGNOSIS — M5442 Lumbago with sciatica, left side: Secondary | ICD-10-CM | POA: Diagnosis not present

## 2024-02-22 DIAGNOSIS — M6281 Muscle weakness (generalized): Secondary | ICD-10-CM | POA: Diagnosis not present

## 2024-02-23 ENCOUNTER — Encounter: Payer: Self-pay | Admitting: Urology

## 2024-02-24 DIAGNOSIS — M5441 Lumbago with sciatica, right side: Secondary | ICD-10-CM | POA: Diagnosis not present

## 2024-02-24 DIAGNOSIS — M6281 Muscle weakness (generalized): Secondary | ICD-10-CM | POA: Diagnosis not present

## 2024-02-24 DIAGNOSIS — G8929 Other chronic pain: Secondary | ICD-10-CM | POA: Diagnosis not present

## 2024-02-24 DIAGNOSIS — M5442 Lumbago with sciatica, left side: Secondary | ICD-10-CM | POA: Diagnosis not present

## 2024-02-29 DIAGNOSIS — M5441 Lumbago with sciatica, right side: Secondary | ICD-10-CM | POA: Diagnosis not present

## 2024-02-29 DIAGNOSIS — M5442 Lumbago with sciatica, left side: Secondary | ICD-10-CM | POA: Diagnosis not present

## 2024-02-29 DIAGNOSIS — M6281 Muscle weakness (generalized): Secondary | ICD-10-CM | POA: Diagnosis not present

## 2024-02-29 DIAGNOSIS — G8929 Other chronic pain: Secondary | ICD-10-CM | POA: Diagnosis not present

## 2024-03-02 ENCOUNTER — Encounter: Payer: Self-pay | Admitting: Internal Medicine

## 2024-03-02 DIAGNOSIS — H4051X3 Glaucoma secondary to other eye disorders, right eye, severe stage: Secondary | ICD-10-CM | POA: Diagnosis not present

## 2024-03-02 DIAGNOSIS — Z961 Presence of intraocular lens: Secondary | ICD-10-CM | POA: Diagnosis not present

## 2024-03-02 DIAGNOSIS — H26492 Other secondary cataract, left eye: Secondary | ICD-10-CM | POA: Diagnosis not present

## 2024-03-02 NOTE — Telephone Encounter (Signed)
 Noted. Nothing further needed.

## 2024-03-02 NOTE — Telephone Encounter (Signed)
 I called and spoke with Brian [name] with [facility] she stated they got the referral I faxed to them on 02/15/24. She also shows order was shipped on 02/15/24. Brian [name] also showed [facility] shipped supplies on 02/08/24. No one has a delivery ticket proving supplies have been shipped or received. I told Brian [name] that they were putting us  in a bad spot with the patient because [facility] states, [employer] states but the patient still doesn't have his supplies. I did speak with Brian [name] letting him know they were putting the blame on each other. Brian [name] stated they faxed a form for a doctor at [facility] that he saw before covid and then faxed a form to PCP Dr. Charlott that he saw 6 years ago. I did give them Dr. Jacqulyn fax number form them to send the DWO/SWO for him to sign even though Brian [name] stated no form was needed. I told Brian [name] that if he didn't get his supplies by next week to call us  back

## 2024-03-03 ENCOUNTER — Other Ambulatory Visit: Payer: Self-pay

## 2024-03-03 ENCOUNTER — Encounter: Payer: Self-pay | Admitting: Family Medicine

## 2024-03-03 DIAGNOSIS — G894 Chronic pain syndrome: Secondary | ICD-10-CM

## 2024-03-03 NOTE — Telephone Encounter (Signed)
 LOV 01/18/24 NOV 05/22/24 LRF 01/18/24 120 x 0

## 2024-03-07 DIAGNOSIS — M6281 Muscle weakness (generalized): Secondary | ICD-10-CM | POA: Diagnosis not present

## 2024-03-07 DIAGNOSIS — M5442 Lumbago with sciatica, left side: Secondary | ICD-10-CM | POA: Diagnosis not present

## 2024-03-07 DIAGNOSIS — G8929 Other chronic pain: Secondary | ICD-10-CM | POA: Diagnosis not present

## 2024-03-07 DIAGNOSIS — M5441 Lumbago with sciatica, right side: Secondary | ICD-10-CM | POA: Diagnosis not present

## 2024-03-07 MED ORDER — OXYCODONE HCL 10 MG PO TABS: 10.0000 mg | ORAL_TABLET | Freq: Four times a day (QID) | ORAL | 0 refills | Status: DC | PRN

## 2024-03-09 DIAGNOSIS — G8929 Other chronic pain: Secondary | ICD-10-CM | POA: Diagnosis not present

## 2024-03-09 DIAGNOSIS — M6281 Muscle weakness (generalized): Secondary | ICD-10-CM | POA: Diagnosis not present

## 2024-03-09 DIAGNOSIS — M5442 Lumbago with sciatica, left side: Secondary | ICD-10-CM | POA: Diagnosis not present

## 2024-03-09 DIAGNOSIS — M5441 Lumbago with sciatica, right side: Secondary | ICD-10-CM | POA: Diagnosis not present

## 2024-03-16 DIAGNOSIS — G8929 Other chronic pain: Secondary | ICD-10-CM | POA: Diagnosis not present

## 2024-03-16 DIAGNOSIS — M5441 Lumbago with sciatica, right side: Secondary | ICD-10-CM | POA: Diagnosis not present

## 2024-03-16 DIAGNOSIS — M5442 Lumbago with sciatica, left side: Secondary | ICD-10-CM | POA: Diagnosis not present

## 2024-03-16 DIAGNOSIS — M6281 Muscle weakness (generalized): Secondary | ICD-10-CM | POA: Diagnosis not present

## 2024-03-21 ENCOUNTER — Other Ambulatory Visit: Payer: Self-pay | Admitting: Cardiovascular Disease

## 2024-03-21 DIAGNOSIS — M6281 Muscle weakness (generalized): Secondary | ICD-10-CM | POA: Diagnosis not present

## 2024-03-21 DIAGNOSIS — M5441 Lumbago with sciatica, right side: Secondary | ICD-10-CM | POA: Diagnosis not present

## 2024-03-21 DIAGNOSIS — G8929 Other chronic pain: Secondary | ICD-10-CM | POA: Diagnosis not present

## 2024-03-21 DIAGNOSIS — M5442 Lumbago with sciatica, left side: Secondary | ICD-10-CM | POA: Diagnosis not present

## 2024-03-23 DIAGNOSIS — M5441 Lumbago with sciatica, right side: Secondary | ICD-10-CM | POA: Diagnosis not present

## 2024-03-23 DIAGNOSIS — M5442 Lumbago with sciatica, left side: Secondary | ICD-10-CM | POA: Diagnosis not present

## 2024-03-23 DIAGNOSIS — M6281 Muscle weakness (generalized): Secondary | ICD-10-CM | POA: Diagnosis not present

## 2024-03-23 DIAGNOSIS — G8929 Other chronic pain: Secondary | ICD-10-CM | POA: Diagnosis not present

## 2024-03-27 DIAGNOSIS — M47816 Spondylosis without myelopathy or radiculopathy, lumbar region: Secondary | ICD-10-CM | POA: Diagnosis not present

## 2024-03-30 ENCOUNTER — Other Ambulatory Visit: Payer: Self-pay | Admitting: Family Medicine

## 2024-03-30 ENCOUNTER — Encounter (INDEPENDENT_AMBULATORY_CARE_PROVIDER_SITE_OTHER): Payer: Self-pay

## 2024-03-30 DIAGNOSIS — G894 Chronic pain syndrome: Secondary | ICD-10-CM

## 2024-03-30 NOTE — Telephone Encounter (Signed)
 Copied from CRM [id number]. Topic: Clinical - Medication Refill >> Mar 30, 2024 10:49 AM Jasmin G wrote: Medication: Oxycodone  HCl 10 MG TABS  Has the patient contacted their pharmacy? No (Agent: If no, request that the patient contact the pharmacy for the refill. If patient does not wish to contact the pharmacy document the reason why and proceed with request.) (Agent: If yes, when and what did the pharmacy advise?)  This is the patient's preferred pharmacy:  [id number], [place] - [address] AT [facility] [address] [place] [address] Phone: [phone] Fax: [phone]  Is this the correct pharmacy for this prescription? Yes If no, delete pharmacy and type the correct one.   Has the prescription been filled recently? Yes  Is the patient out of the medication? No  Has the patient been seen for an appointment in the last year OR does the patient have an upcoming appointment? Yes  Can we respond through MyChart? No  Agent: Please be advised that Rx refills may take up to 3 business days. We ask that you follow-up with your pharmacy.

## 2024-03-30 NOTE — Telephone Encounter (Signed)
 Patient will be out of medication over the weekend No new symptoms and no new complaints Patient was just calling this in ahead of time before the weekend

## 2024-03-31 NOTE — Telephone Encounter (Signed)
 Requested medications are due for refill today.  No - pt has a new rx starting 04/02/2024  Requested medications are on the active medications list.  yes  Last refill.   Future visit scheduled.     Notes to clinic.  Refill/refusal not delegated.    Requested Prescriptions  Pending Prescriptions Disp Refills   Oxycodone  HCl 10 MG TABS 120 tablet 0    Sig: Take 1-2 tablets (10-20 mg total) by mouth every 6 (six) hours as needed (moderate to severe pain).     Not Delegated - Analgesics:  Opioid Agonists Failed - 03/31/2024 10:14 AM      Failed - This refill cannot be delegated      Failed - Urine Drug Screen completed in last 360 days      Passed - Valid encounter within last 3 months    Recent Outpatient Visits           2 months ago Chronic pain syndrome   [place] [facility] [place], Jon HERO, MD   3 months ago Prediabetes   [facility] [place], Jon HERO, MD   6 months ago Other cough   [place] [facility] [place], [place], PA-C   6 months ago Acute on chronic low back pain   [place] [facility] [place], [place], PA-C   6 months ago Primary hypertension   [place] [facility] [place], Jon HERO, MD       Future Appointments             In 1 month Gollan, Timothy J, MD  [facility] [place]   In 5 months Stoioff, Glendia BROCKS, MD [facility] [place]   In 6 months Hester Alm BROCKS, MD [facility]

## 2024-03-31 NOTE — Progress Notes (Signed)
 Referring Physician:  [name], MD [address] [place],  [place] [address]  Primary Physician:  [name], MD  History of Present Illness: [name] has a history of ischemic bowel disease, MI, HTN, CAD, ischemic cardiomyopathy, history of PE, sleep apnea, hyperlipidemia, chronic pain syndrome, skin CA.   He has known T12 compression fracture that likely occurred around 09/09/23. He had kyphoplasty procedure at T12 on 11/25/23 with improvement in pain.   He also has known underlying lumbar scoliosis, spondylosis, and multilevel foraminal stenosis. History of right drop foot years ago and he still has weakness.   Last seen by me on 01/3024.  He was to continue with PT and follow up with Dr. Dodson regarding further injections.   He had initial eval with PT on 02/08/24 and has done 8 more visits through 03/23/24.   He had bilateral L3, L4, L5 MBB on 03/27/24.   He is here for follow up.    He continues on oxycodone  and baclofen  from his PCP. Has not been taking baclofen .   He is caregiver for his wife.   He smokes 1/2 PPD x 60+ years. He recently quit, but smokes intermittently.    Conservative measures:  Physical therapy: initial eval at [facility] on 02/08/24 with 8 visits through 03/23/24 Injections:  03/27/24 bilateral MBB L3, L4, L5 01/31/24 bilateral S1 TF ESI  01/03/24 bilateral S1 TF ESI   Surgery:  Kyphoplasty T12 on 11/25/23  The symptoms are causing a significant impact on the patient's life.   Review of Systems:  A 10 point review of systems is negative, except for the pertinent positives and negatives detailed in the HPI.  Past Medical History: Past Medical History:  Diagnosis Date   AAA (abdominal aortic aneurysm) (HCC)    Adenomatous polyp    Anxiety    Arthritis    Cancer (HCC) 2022   Carpal tunnel syndrome on left    Cataract    Cervical spine disease    Colitis    Coronary artery disease    S/p anterior MI tx with PCI in  1998  //  Myoview  6/22: EF 45, ant and septal scar; no ischemia; intermediate risk // Echocardiogram 7/22: EF 45-50, ant-sept and apical HK, Gr 1 DD, normal RVSF, mild MR   Deviated septum    Emphysema of lung (HCC) 2024   Glaucoma 2023   Hyperlipidemia    Hypertension    Renal Artery US  12/2022: no RAS bilaterally   Ischemic cardiomyopathy 05/19/2022   Echocardiogram 06/02/22: EF 45-50, global HK, Gr 1 DD, NL RVSF, NL PASP (RVSP 26.4), trivial MR, RAP 3   Low back pain    Myocardial infarction (HCC) 1998   Personal history of pulmonary embolism    Pulmonary embolism (HCC) 08/26/2022   Sleep apnea    CPAP   Wears dentures    full upper    Past Surgical History: Past Surgical History:  Procedure Laterality Date   APPENDECTOMY     AQUEOUS SHUNT Right 10/26/2022   Procedure: AHMED TUBE SHUNT WITH TUTOPLAST RIGHT;  Surgeon: Myrna Adine Anes, MD;  Location: [facility];  Service: Ophthalmology;  Laterality: Right;   bunions     CATARACT EXTRACTION     COLONOSCOPY WITH PROPOFOL  N/A 02/19/2015   Procedure: COLONOSCOPY WITH PROPOFOL ;  Surgeon: Rogelia Copping, MD;  Location: [facility] ENDOSCOPY;  Service: Endoscopy;  Laterality: N/A;   COLONOSCOPY WITH PROPOFOL  N/A 06/05/2019   Procedure: COLONOSCOPY WITH  BIOPSY;  Surgeon: Jinny Carmine, MD;  Location: [facility];  Service: Endoscopy;  Laterality: N/A;  sleep apnea   ERCP N/A 08/25/2022   Procedure: ENDOSCOPIC RETROGRADE CHOLANGIOPANCREATOGRAPHY (ERCP);  Surgeon: Jinny Carmine, MD;  Location: [facility] END[facility]COPY;  Service: Endoscopy;  Laterality: N/A;   EYE SURGERY     HEMORRHOID BANDING     HERNIA REPAIR     x3   IR KYPHO THORACIC WITH BONE BIOPSY  11/25/2023   IR RADIOLOGIST EVAL & MGMT  11/11/2023   OLECRANON BURSECTOMY Right    POLYPECTOMY N/A 06/05/2019   Procedure: POLYPECTOMY;  Surgeon: Jinny Carmine, MD;  Location: [facility];  Service: Endoscopy;  Laterality: N/A;    Allergies: Allergies as of 04/03/2024   (No  Known Allergies)    Medications: Outpatient Encounter Medications as of 04/03/2024  Medication Sig   albuterol  (VENTOLIN  HFA) 108 (90 Base) MCG/ACT inhaler Inhale 2 puffs into the lungs every 6 (six) hours as needed for wheezing or shortness of breath.   alendronate  (FOSAMAX ) 70 MG tablet Take 1 tablet (70 mg total) by mouth every 7 (seven) days. Take with a full glass of water  on an empty stomach.   amLODipine  (NORVASC ) 10 MG tablet TAKE 1 TABLET(10 MG) BY MOUTH DAILY   aspirin  81 MG tablet Take 81 mg by mouth at bedtime.   atorvastatin  (LIPITOR) 40 MG tablet Take 1 tablet (40 mg total) by mouth at bedtime.   Cholecalciferol (VITAMIN D3) 50 MCG (2000 UT) CAPS Take by mouth daily.   Coenzyme Q10 (COQ10) 100 MG CAPS Take 1 capsule by mouth daily.     furosemide  (LASIX ) 20 MG tablet Take 1 tablet (20 mg) by mouth once daily as needed for swelling   hydrALAZINE  (APRESOLINE ) 25 MG tablet Take 1 tablet (25 mg total) by mouth 3 (three) times daily. Take as needed for blood pressure greater then 140.   meclizine  (ANTIVERT ) 25 MG tablet Take 1 tablet (25 mg total) by mouth 2 (two) times daily as needed for dizziness.   Multiple Vitamin (MULTIVITAMIN PO) Take 1 tablet by mouth daily.   nitroGLYCERIN  (NITROSTAT ) 0.4 MG SL tablet PLACE 1 TABLET UNDER THE TONGUE EVERY 5 MINUTES AS NEEDED FOR CHEST PAIN. MAX 3 TABLETS   Oxycodone  HCl 10 MG TABS Take 1-2 tablets (10-20 mg total) by mouth every 6 (six) hours as needed (moderate to severe pain).   saccharomyces boulardii (FLORASTOR) 250 MG capsule Take 250 mg by mouth 2 (two) times daily.   [DISCONTINUED] Oxycodone  HCl 10 MG TABS Take 1-2 tablets (10-20 mg total) by mouth every 6 (six) hours as needed (moderate to severe pain).   Facility-Administered Encounter Medications as of 04/03/2024  Medication   albuterol  (PROVENTIL ) (2.5 MG/3ML) 0.083% nebulizer solution 2.5 mg    Social History: Social History   Tobacco Use   Smoking status: Every Day     Current packs/day: 0.25    Average packs/day: 0.3 packs/day for 65.7 years (16.4 ttl pk-yrs)    Types: Cigarettes    Start date: 1960   Smokeless tobacco: Never   Tobacco comments:    1/2 - 1/4 ppd (since age 59) 12/15/23  Vaping Use   Vaping status: Never Used  Substance Use Topics   Alcohol use: Not Currently    Comment: occasionally, none   Drug use: No    Family Medical History: Family History  Problem Relation Age of Onset   Heart failure Mother    Kidney disease Father  Hypertension Father    Coronary artery disease Brother    Hypertension Brother    Alzheimer's disease Maternal Grandmother    Colon cancer Maternal Grandfather     Physical Examination: Vitals:   04/03/24 1336  BP: 120/70    Awake, alert, oriented to person, place, and time.  Speech is clear and fluent. Fund of knowledge is appropriate.   Cranial Nerves: Pupils equal round and reactive to light.  Facial tone is symmetric.    No tenderness TL junction. No lower lumbar tenderness.   No abnormal lesions on exposed skin.   Strength: Side Iliopsoas Quads Hamstring PF DF EHL  R 5 5 5 5 3 3   L 5 5 5 5 5 5    Right foot drop is chronic per patient.   Reflexes are 2+ and symmetric at the patella and achilles, but right achilles is diminished.     Bilateral lower extremity sensation is intact to light touch, slightly diminished in both legs from knees down to feet.   Gait is slow.   Medical Decision Making  Imaging: none  Assessment and Plan: Mr. Diekman has known T12 compression fracture that likely occurred around 09/09/23. He had kyphoplasty procedure at T12 on 11/25/23 with improvement in pain.   LBP has improved since MBB (was a 7 and is now a 3). He still has constant LBP with no leg pain. No numbness or tingling. Has known right foot drop x years.   He has known underlying lumbar scoliosis, spondylosis, and multilevel foraminal stenosis.   Treatment options discussed with patient and  following plan made:   - Continue with PT for lumbar spine. He will call [facility] and they will contact Dr. Dodson if any further orders needed.  - Follow up with Dr. Dodson as scheduled to discuss repeat MBB/RFA.  - Continue on oxycodone  and baclofen  from PCP.  - Follow up with  me in 6-8 weeks and prn.   I spent a total of 15 minutes in face-to-face and non-face-to-face activities related to this patient's care today including review of outside records, review of imaging, review of symptoms, physical exam, discussion of differential diagnosis, discussion of treatment options, and documentation.   [name] PA-C Dept. of Neurosurgery

## 2024-04-03 ENCOUNTER — Telehealth: Payer: Self-pay | Admitting: Orthopedic Surgery

## 2024-04-03 ENCOUNTER — Ambulatory Visit (INDEPENDENT_AMBULATORY_CARE_PROVIDER_SITE_OTHER): Admitting: Orthopedic Surgery

## 2024-04-03 ENCOUNTER — Encounter: Payer: Self-pay | Admitting: Orthopedic Surgery

## 2024-04-03 VITALS — BP 120/70 | Ht 68.0 in | Wt 166.5 lb

## 2024-04-03 DIAGNOSIS — S22088D Other fracture of T11-T12 vertebra, subsequent encounter for fracture with routine healing: Secondary | ICD-10-CM

## 2024-04-03 DIAGNOSIS — M48061 Spinal stenosis, lumbar region without neurogenic claudication: Secondary | ICD-10-CM | POA: Diagnosis not present

## 2024-04-03 DIAGNOSIS — M47816 Spondylosis without myelopathy or radiculopathy, lumbar region: Secondary | ICD-10-CM | POA: Diagnosis not present

## 2024-04-03 DIAGNOSIS — M419 Scoliosis, unspecified: Secondary | ICD-10-CM | POA: Diagnosis not present

## 2024-04-03 MED ORDER — OXYCODONE HCL 10 MG PO TABS: 10.0000 mg | ORAL_TABLET | Freq: Four times a day (QID) | ORAL | 0 refills | Status: DC | PRN

## 2024-04-03 NOTE — Patient Instructions (Signed)
 It was so nice to see you today. Thank you so much for coming in.    I sent Dr. Dodson a message to reorder back PT at [facility]. Will let you know when I hear back from her.   Follow up with her as scheduled to discuss repeat injections.   I will see you back in 6-8 weeks. Please do not hesitate to call if you have any questions or concerns. You can also message me in MyChart.   Brian Boys PA-C [phone]     The physicians and staff at [facility] at [facility] are committed to providing excellent care. You may receive a survey asking for feedback about your experience at our office. We value you your feedback and appreciate you taking the time to to fill it out. The [facility] leadership team is also available to discuss your experience in person, feel free to contact us  [phone].

## 2024-04-03 NOTE — Telephone Encounter (Signed)
 Please let him know Dr. Dodson reviewed his chart and his PT should still be active.   She wants him to call PT and see if he can schedule a visit. If not, then let her know.   Thanks!

## 2024-04-04 DIAGNOSIS — M5441 Lumbago with sciatica, right side: Secondary | ICD-10-CM | POA: Diagnosis not present

## 2024-04-04 DIAGNOSIS — G8929 Other chronic pain: Secondary | ICD-10-CM | POA: Diagnosis not present

## 2024-04-04 DIAGNOSIS — M6281 Muscle weakness (generalized): Secondary | ICD-10-CM | POA: Diagnosis not present

## 2024-04-04 DIAGNOSIS — M5442 Lumbago with sciatica, left side: Secondary | ICD-10-CM | POA: Diagnosis not present

## 2024-04-04 NOTE — Telephone Encounter (Signed)
 Patient notified and states he will contact Dr. Dodson' office to get his PT renewed.

## 2024-04-06 NOTE — Telephone Encounter (Signed)
 Pt called wanting to know the status of his message sent 9.11.25. Please advise patient.

## 2024-04-06 NOTE — Telephone Encounter (Signed)
 I think the original message I typed was deleted but we can bring him in to see Gwendlyn to discuss additional options.

## 2024-04-10 DIAGNOSIS — G8929 Other chronic pain: Secondary | ICD-10-CM | POA: Diagnosis not present

## 2024-04-10 DIAGNOSIS — M47816 Spondylosis without myelopathy or radiculopathy, lumbar region: Secondary | ICD-10-CM | POA: Diagnosis not present

## 2024-04-10 DIAGNOSIS — M5442 Lumbago with sciatica, left side: Secondary | ICD-10-CM | POA: Diagnosis not present

## 2024-04-10 DIAGNOSIS — M5416 Radiculopathy, lumbar region: Secondary | ICD-10-CM | POA: Diagnosis not present

## 2024-04-10 DIAGNOSIS — M5441 Lumbago with sciatica, right side: Secondary | ICD-10-CM | POA: Diagnosis not present

## 2024-04-13 DIAGNOSIS — H4051X3 Glaucoma secondary to other eye disorders, right eye, severe stage: Secondary | ICD-10-CM | POA: Diagnosis not present

## 2024-04-13 DIAGNOSIS — H34811 Central retinal vein occlusion, right eye, with macular edema: Secondary | ICD-10-CM | POA: Diagnosis not present

## 2024-04-13 DIAGNOSIS — Z961 Presence of intraocular lens: Secondary | ICD-10-CM | POA: Diagnosis not present

## 2024-04-13 DIAGNOSIS — H26492 Other secondary cataract, left eye: Secondary | ICD-10-CM | POA: Diagnosis not present

## 2024-04-17 ENCOUNTER — Encounter (INDEPENDENT_AMBULATORY_CARE_PROVIDER_SITE_OTHER): Payer: Self-pay | Admitting: Vascular Surgery

## 2024-04-17 ENCOUNTER — Ambulatory Visit (INDEPENDENT_AMBULATORY_CARE_PROVIDER_SITE_OTHER): Admitting: Vascular Surgery

## 2024-04-17 VITALS — BP 129/71 | HR 62 | Ht 66.0 in | Wt 166.1 lb

## 2024-04-17 DIAGNOSIS — G8929 Other chronic pain: Secondary | ICD-10-CM

## 2024-04-17 DIAGNOSIS — I10 Essential (primary) hypertension: Secondary | ICD-10-CM | POA: Diagnosis not present

## 2024-04-17 DIAGNOSIS — R7303 Prediabetes: Secondary | ICD-10-CM

## 2024-04-17 DIAGNOSIS — R600 Localized edema: Secondary | ICD-10-CM | POA: Diagnosis not present

## 2024-04-17 DIAGNOSIS — M545 Low back pain, unspecified: Secondary | ICD-10-CM

## 2024-04-17 NOTE — Progress Notes (Signed)
 Subjective:    Patient ID: Brian [name], male    DOB: [birth year], 83 y.o.   MRN: [id number] No chief complaint on file.   Brian [name] is an 83 year old male presents to clinic today with chief complaint of right lower extremity swelling to his ankle and his foot.  He was last seen on 12/22/2023 for bilateral lower extremity swelling.  At that time I recommended he start conservative therapy such as compression, elevation, rest and exercise.  Patient is adamant in stating that because he cares for his wife he is on his feet all day and cannot elevate his legs at all.  He just started wearing compression socks which he feels are working excellent for his lower extremities from his ankles up but is not solving any swelling to his right foot.  He feels like the swelling to his right foot limits his ability to bend and use his foot even though he has right foot drop.  He does like the swelling to his right foot and ankle and is looking to be reevaluated to see if there is anything else we can do today.    Review of Systems  Constitutional: Negative.   Cardiovascular:  Positive for leg swelling.       Bilateral lower extremity swelling controlled well with compression socks except for his right foot and ankle where he has previous foot drop.  Musculoskeletal:  Positive for myalgias.  Neurological:  Positive for weakness.       History of spinal stenosis with spondylolisthesis and foot drop to his right foot.  All other systems reviewed and are negative.      Objective:   Physical Exam Vitals reviewed.  Constitutional:      Appearance: Normal appearance. He is normal weight.  HENT:     Head: Normocephalic.  Eyes:     Pupils: Pupils are equal, round, and reactive to light.  Cardiovascular:     Rate and Rhythm: Normal rate and regular rhythm.     Pulses: Normal pulses.     Heart sounds: Normal heart sounds.  Pulmonary:     Effort: Pulmonary effort is normal.     Breath sounds: Normal  breath sounds.  Abdominal:     General: Abdomen is flat. Bowel sounds are normal.     Palpations: Abdomen is soft.  Musculoskeletal:        General: Swelling present.     Right lower leg: Edema present.     Left lower leg: Edema present.     Comments: Well-controlled bilateral lower extremity swelling from his ankles to his knees bilaterally.  Positive swelling to his right ankle and his right foot as well as neurologic problems with foot drop post multiple back procedures and injections.  Skin:    General: Skin is warm and dry.     Capillary Refill: Capillary refill takes 2 to 3 seconds.  Neurological:     General: No focal deficit present.     Mental Status: He is alert and oriented to person, place, and time. Mental status is at baseline.  Psychiatric:        Mood and Affect: Mood normal.        Behavior: Behavior normal.        Thought Content: Thought content normal.        Judgment: Judgment normal.     BP 129/71   Pulse 62   Ht 5' 6 (1.676 m)   Wt 166 lb 2 oz (75.4  kg)   BMI 26.81 kg/m   Past Medical History:  Diagnosis Date   AAA (abdominal aortic aneurysm)    Adenomatous polyp    Anxiety    Arthritis    Cancer (HCC) 2022   Carpal tunnel syndrome on left    Cataract    Cervical spine disease    Colitis    Coronary artery disease    S/p anterior MI tx with PCI in 1998  //  Myoview  6/22: EF 45, ant and septal scar; no ischemia; intermediate risk // Echocardiogram 7/22: EF 45-50, ant-sept and apical HK, Gr 1 DD, normal RVSF, mild MR   Deviated septum    Emphysema of lung (HCC) 2024   Glaucoma 2023   Hyperlipidemia    Hypertension    Renal Artery US  12/2022: no RAS bilaterally   Ischemic cardiomyopathy 05/19/2022   Echocardiogram 06/02/22: EF 45-50, global HK, Gr 1 DD, NL RVSF, NL PASP (RVSP 26.4), trivial MR, RAP 3   Low back pain    Myocardial infarction (HCC) 1998   Personal history of pulmonary embolism    Pulmonary embolism (HCC) 08/26/2022   Sleep  apnea    CPAP   Wears dentures    full upper    Social History   Socioeconomic History   Marital status: Married    Spouse name: Brian [name]   Number of children: 1   Years of education: Not on file   Highest education level: Some college, no degree  Occupational History   Occupation: retired    Comment: Electronics engineer  Tobacco Use   Smoking status: Every Day    Current packs/day: 0.25    Average packs/day: 0.3 packs/day for 65.7 years (16.4 ttl pk-yrs)    Types: Cigarettes    Start date: 1960   Smokeless tobacco: Never   Tobacco comments:    1/2 - 1/4 ppd (since age 39) 12/15/23  Vaping Use   Vaping status: Never Used  Substance and Sexual Activity   Alcohol use: Not Currently    Comment: occasionally, none   Drug use: No   Sexual activity: Yes    Partners: Female  Other Topics Concern   Not on file  Social History Narrative   Not on file   Social Drivers of Health   Financial Resource Strain: Low Risk  (02/08/2024)   Received from [facility]   Overall Financial Resource Strain (CARDIA)    Difficulty of Paying Living Expenses: Not hard at all  Food Insecurity: No Food Insecurity (02/08/2024)   Received from [facility]   Hunger Vital Sign    Within the past 12 months, you worried that your food would run out before you got the money to buy more.: Never true    Within the past 12 months, the food you bought just didn't last and you didn't have money to get more.: Never true  Transportation Needs: No Transportation Needs (02/08/2024)   Received from [facility] - Transportation    In the past 12 months, has lack of transportation kept you from medical appointments or from getting medications?: No    Lack of Transportation (Non-Medical): No  Physical Activity: Insufficiently Active (01/17/2024)   Exercise Vital Sign    Days of Exercise per Week: 4 days    Minutes of Exercise per  Session: 30 min  Stress: No Stress Concern Present (01/17/2024)   [employer] of Occupational Health - Occupational Stress Questionnaire  Feeling of Stress: Not at all  Social Connections: Moderately Integrated (01/17/2024)   Social Connection and Isolation Panel    Frequency of Communication with Friends and Family: More than three times a week    Frequency of Social Gatherings with Friends and Family: Once a week    Attends Religious Services: More than 4 times per year    Active Member of [employer] or Organizations: No    Attends Banker Meetings: Not on file    Marital Status: Married  Intimate Partner Violence: Not At Risk (05/25/2023)   Humiliation, Afraid, Rape, and Kick questionnaire    Fear of Current or Ex-Partner: No    Emotionally Abused: No    Physically Abused: No    Sexually Abused: No    Past Surgical History:  Procedure Laterality Date   APPENDECTOMY     AQUEOUS SHUNT Right 10/26/2022   Procedure: AHMED TUBE SHUNT WITH TUTOPLAST RIGHT;  Surgeon: Myrna Adine Anes, MD;  Location: [facility];  Service: Ophthalmology;  Laterality: Right;   bunions     CATARACT EXTRACTION     COLONOSCOPY WITH PROPOFOL  N/A 02/19/2015   Procedure: COLONOSCOPY WITH PROPOFOL ;  Surgeon: Rogelia Copping, MD;  Location: [facility];  Service: Endoscopy;  Laterality: N/A;   COLONOSCOPY WITH PROPOFOL  N/A 06/05/2019   Procedure: COLONOSCOPY WITH BIOPSY;  Surgeon: Copping Rogelia, MD;  Location: [facility];  Service: Endoscopy;  Laterality: N/A;  sleep apnea   ERCP N/A 08/25/2022   Procedure: ENDOSCOPIC RETROGRADE CHOLANGIOPANCREATOGRAPHY (ERCP);  Surgeon: Copping Rogelia, MD;  Location: [facility];  Service: Endoscopy;  Laterality: N/A;   EYE SURGERY     HEMORRHOID BANDING     HERNIA REPAIR     x3   IR KYPHO THORACIC WITH BONE BIOPSY  11/25/2023   IR RADIOLOGIST EVAL & MGMT  11/11/2023   OLECRANON BURSECTOMY Right    POLYPECTOMY N/A 06/05/2019   Procedure:  POLYPECTOMY;  Surgeon: Copping Rogelia, MD;  Location: [facility];  Service: Endoscopy;  Laterality: N/A;    Family History  Problem Relation Age of Onset   Heart failure Mother    Kidney disease Father    Hypertension Father    Coronary artery disease Brother    Hypertension Brother    Alzheimer's disease Maternal Grandmother    Colon cancer Maternal Grandfather     No Known Allergies     Latest Ref Rng & Units 11/10/2023   12:00 AM 02/01/2023    3:20 PM 09/09/2022   12:00 AM  CBC  WBC 3.8 - 10.8 Thousand/uL 9.2  10.0  8.7      Hemoglobin 13.2 - 17.1 g/dL 84.5  85.0  86.5      Hematocrit 38.5 - 50.0 % 46.1  43.5  40      Platelets 140 - 400 Thousand/uL 249  253  446         This result is from an external source.      CMP     Component Value Date/Time   NA 140 01/18/2024 1555   NA 138 02/26/2014 1727   K 5.4 (H) 01/18/2024 1555   K 4.7 02/26/2014 1727   CL 100 01/18/2024 1555   CL 106 02/26/2014 1727   CO2 22 01/18/2024 1555   CO2 26 02/26/2014 1727   GLUCOSE 93 01/18/2024 1555   GLUCOSE 92 11/10/2023 0000   GLUCOSE 83 02/26/2014 1727   BUN 17 01/18/2024 1555   BUN 12 02/26/2014 1727  CREATININE 1.00 01/18/2024 1555   CREATININE 0.96 11/10/2023 0000   CALCIUM  10.1 01/18/2024 1555   CALCIUM  8.9 02/26/2014 1727   PROT 6.7 12/06/2023 1606   PROT 6.9 02/26/2014 1727   ALBUMIN 4.2 12/06/2023 1606   ALBUMIN 3.5 02/26/2014 1727   AST 18 12/06/2023 1606   AST 23 02/26/2014 1727   ALT 16 12/06/2023 1606   ALT 26 02/26/2014 1727   ALKPHOS 124 (H) 12/06/2023 1606   ALKPHOS 91 02/26/2014 1727   BILITOT 0.3 12/06/2023 1606   BILITOT 0.6 02/26/2014 1727   EGFR 75 01/18/2024 1555   GFRNONAA >60 06/08/2023 1505   GFRNONAA >60 02/26/2014 1727     No results found.     Assessment & Plan:   1. Lower extremity edema (Primary) Recommend:  I have had a long discussion with the patient regarding swelling and why it  causes symptoms.  Patient will begin  wearing graduated compression on a daily basis a prescription was given. The patient will  wear the stockings first thing in the morning and removing them in the evening. The patient is instructed specifically not to sleep in the stockings.   In addition, behavioral modification will be initiated.  This will include frequent elevation, use of over the counter pain medications and exercise such as walking.  Consideration for a lymph pump will also be made based upon the effectiveness of conservative therapy.  This would help to improve the edema control and prevent sequela such as ulcers and infections   Patient completed duplex ultrasound of the venous system to ensure that DVT or reflux is not present in his right lower extremity. Ultrasound was negative for both.   The patient will follow-up with me in another 3 months as already previously scheduled.  2. Essential hypertension Continue antihypertensive medications as already ordered, these medications have been reviewed and there are no changes at this time.  3. Prediabetes Continue hypoglycemic medications as already ordered, these medications have been reviewed and there are no changes at this time.  Hgb A1C to be monitored as already arranged by primary service  4. Chronic bilateral low back pain, unspecified whether sciatica present Patient's had a long history of chronic back pain with lumbar spondylosis and lumbar radiculitis.  He also has a T12 compression fracture and history of elevated hemoglobin A1c's for which he refuses to take any medication for and is trying to diet control.  He also endorses today that he has had bunion surgery of both his feet and postoperatively from all his lumbar changes he has also had right foot drop with swelling to his right ankle and foot since that time.  He returns to clinic today looking for other treatment options for his right lower extremity swelling.  Unfortunately I believe part of his  swelling is related to his neurologic problems from his spine.  All I can recommend at this time is to continue with the conservative therapy which she has only done for 3 months.   Current Outpatient Medications on File Prior to Visit  Medication Sig Dispense Refill   albuterol  (VENTOLIN  HFA) 108 (90 Base) MCG/ACT inhaler Inhale 2 puffs into the lungs every 6 (six) hours as needed for wheezing or shortness of breath. 8 g 2   alendronate  (FOSAMAX ) 70 MG tablet Take 1 tablet (70 mg total) by mouth every 7 (seven) days. Take with a full glass of water  on an empty stomach. 4 tablet 11   amLODipine  (NORVASC ) 10 MG tablet  TAKE 1 TABLET(10 MG) BY MOUTH DAILY 90 tablet 0   aspirin  81 MG tablet Take 81 mg by mouth at bedtime.     atorvastatin  (LIPITOR) 40 MG tablet Take 1 tablet (40 mg total) by mouth at bedtime. 100 tablet 3   Cholecalciferol (VITAMIN D3) 50 MCG (2000 UT) CAPS Take by mouth daily.     Coenzyme Q10 (COQ10) 100 MG CAPS Take 1 capsule by mouth daily.       furosemide  (LASIX ) 20 MG tablet Take 1 tablet (20 mg) by mouth once daily as needed for swelling 30 tablet 3   hydrALAZINE  (APRESOLINE ) 25 MG tablet Take 1 tablet (25 mg total) by mouth 3 (three) times daily. Take as needed for blood pressure greater then 140. 270 tablet 3   meclizine  (ANTIVERT ) 25 MG tablet Take 1 tablet (25 mg total) by mouth 2 (two) times daily as needed for dizziness. 30 tablet 0   Multiple Vitamin (MULTIVITAMIN PO) Take 1 tablet by mouth daily.     nitroGLYCERIN  (NITROSTAT ) 0.4 MG SL tablet PLACE 1 TABLET UNDER THE TONGUE EVERY 5 MINUTES AS NEEDED FOR CHEST PAIN. MAX 3 TABLETS 25 tablet 1   Oxycodone  HCl 10 MG TABS Take 1-2 tablets (10-20 mg total) by mouth every 6 (six) hours as needed (moderate to severe pain). 120 tablet 0   saccharomyces boulardii (FLORASTOR) 250 MG capsule Take 250 mg by mouth 2 (two) times daily.     Current Facility-Administered Medications on File Prior to Visit  Medication Dose Route  Frequency Provider Last Rate Last Admin   albuterol  (PROVENTIL ) (2.5 MG/3ML) 0.083% nebulizer solution 2.5 mg  2.5 mg Nebulization Once Stoneking, Hal, MD        There are no Patient Instructions on file for this visit. No follow-ups on file.   Gwendlyn JONELLE Shank, NP

## 2024-04-18 ENCOUNTER — Encounter: Payer: Self-pay | Admitting: Family Medicine

## 2024-04-19 DIAGNOSIS — M6281 Muscle weakness (generalized): Secondary | ICD-10-CM | POA: Diagnosis not present

## 2024-04-19 DIAGNOSIS — G8929 Other chronic pain: Secondary | ICD-10-CM | POA: Diagnosis not present

## 2024-04-19 DIAGNOSIS — M5442 Lumbago with sciatica, left side: Secondary | ICD-10-CM | POA: Diagnosis not present

## 2024-04-19 DIAGNOSIS — M5441 Lumbago with sciatica, right side: Secondary | ICD-10-CM | POA: Diagnosis not present

## 2024-04-26 DIAGNOSIS — M5441 Lumbago with sciatica, right side: Secondary | ICD-10-CM | POA: Diagnosis not present

## 2024-04-26 DIAGNOSIS — M6281 Muscle weakness (generalized): Secondary | ICD-10-CM | POA: Diagnosis not present

## 2024-04-26 DIAGNOSIS — G8929 Other chronic pain: Secondary | ICD-10-CM | POA: Diagnosis not present

## 2024-04-26 DIAGNOSIS — M5442 Lumbago with sciatica, left side: Secondary | ICD-10-CM | POA: Diagnosis not present

## 2024-04-28 ENCOUNTER — Encounter: Payer: Self-pay | Admitting: Family Medicine

## 2024-04-28 ENCOUNTER — Other Ambulatory Visit: Payer: Self-pay

## 2024-04-28 DIAGNOSIS — G894 Chronic pain syndrome: Secondary | ICD-10-CM

## 2024-04-30 NOTE — Progress Notes (Unsigned)
 Cardiology Office Note  Date:  05/01/2024   ID:  Brian [name], DOB June 15, 1941, MRN [id number]  PCP:  [name], MD   Chief Complaint  Patient presents with   6 month follow up     Denies chest pain or shortness of breath.     HPI:  Brian [name] is a 83 y.o. 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 Age 12 quit early 2024 left subclavian stenosis, Ejection fraction 45 to 50% November 2023 Ejection fraction 40 to 45% October 2024 4 mm cavernous right ICA aneurysm.  PE February 2024 Who presents for routine follow-up of his essential hypertension, coronary disease, PAD, leg swelling  Last seen by myself in clinic December 2024 Reports that he is very active at baseline Helps wife, who has COPD Manages house, does cleaning, shopping, house maintenance  Reports having some shortness of breath, concern for stent blockages Continues to smoke less than 1 pack/day Reports he has never tried any of the modalities to quit smoking  Chronic ankle swelling, seen by vein and vascular, wears compression hose  EKG personally reviewed by myself on todays visit EKG Interpretation Date/Time:  Monday May 01 2024 16:14:19 EDT Ventricular Rate:  65 PR Interval:  178 QRS Duration:  102 QT Interval:  394 QTC Calculation: 409 R Axis:   -61  Text Interpretation: Normal sinus rhythm Left axis deviation Anteroseptal infarct (cited on or before 28-May-2001) When compared with ECG of 06-Jul-2023 11:18, No significant change was found Confirmed by Perla Lye [phone]) on 05/01/2024 4:31:31 PM   Prior imaging reviewed Echocardiogram May 14, 2023 EF 40 to 45%, global hypokinesis, normal RV size and function  EF slightly worse compared to prior echocardiogram November 2023  Event monitor August 2024, normal sinus rhythm no  significant arrhythmia  Carotid ultrasound moderate bilateral plaque  Stress test June 2022 fixed anterior wall defect EF 45%  History of PE February 2024, still on Eliquis  at therapeutic dose  Lab work reviewed Total cholesterol 119 LDL 51  Prior records reviewed  Hospitalized in February 2024 with sepsis following ERCP and PE,  issues with low BP  lisinopril  was held.  started on Diamox in March for vision loss by his optometrist and had low BP readings  Diamox was then stopped 3/27. Pt sent in elevated BP readings at the end of March up to 170s/100s. His lisinopril  was increased to 2.5mg  BID. His optometrist then resumed Diamox 10/20/22 so his lisinopril  was decreased back to once daily dosing. Diamox then stopped 4/10 after eye surgery. Lisinopril  increased to 2.5mg  BID on 4/17 due to elevated BP. Labile blood pressure   quit smoking in February when he was hospitalized. Symptoms of dizziness that is unrelated to his BP, saw neurology  no clear cause identified.  Has not had any falls.  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 No plans to resume Diamox. 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 His daughter is an Charity fundraiser at [employer]. Last low BP reading was a week ago - 92/58. High on Saturday was 168/97.   PMH:   has a past medical history of AAA (abdominal aortic aneurysm), Adenomatous polyp, Anxiety, Arthritis, Cancer (HCC) (2022), Carpal tunnel syndrome on left, Cataract, Cervical spine disease, Colitis, Coronary artery disease, Deviated septum, Emphysema  of lung (HCC) (2024), Glaucoma (2023), Hyperlipidemia, Hypertension, Ischemic cardiomyopathy (05/19/2022), Low back pain, Myocardial infarction (HCC) (1998), Personal history of pulmonary embolism, Pulmonary embolism (HCC) (08/26/2022), Sleep apnea, and Wears dentures.  PSH:    Past Surgical History:  Procedure Laterality Date   APPENDECTOMY     AQUEOUS SHUNT Right 10/26/2022   Procedure:  AHMED TUBE SHUNT WITH TUTOPLAST RIGHT;  Surgeon: Myrna Adine Anes, MD;  Location: [facility];  Service: Ophthalmology;  Laterality: Right;   bunions     CATARACT EXTRACTION     COLONOSCOPY WITH PROPOFOL  N/A 02/19/2015   Procedure: COLONOSCOPY WITH PROPOFOL ;  Surgeon: Rogelia Copping, MD;  Location: [facility];  Service: Endoscopy;  Laterality: N/A;   COLONOSCOPY WITH PROPOFOL  N/A 06/05/2019   Procedure: COLONOSCOPY WITH BIOPSY;  Surgeon: Copping Rogelia, MD;  Location: [facility];  Service: Endoscopy;  Laterality: N/A;  sleep apnea   ERCP N/A 08/25/2022   Procedure: ENDOSCOPIC RETROGRADE CHOLANGIOPANCREATOGRAPHY (ERCP);  Surgeon: Copping Rogelia, MD;  Location: [facility];  Service: Endoscopy;  Laterality: N/A;   EYE SURGERY     HEMORRHOID BANDING     HERNIA REPAIR     x3   IR KYPHO THORACIC WITH BONE BIOPSY  11/25/2023   IR RADIOLOGIST EVAL & MGMT  11/11/2023   OLECRANON BURSECTOMY Right    POLYPECTOMY N/A 06/05/2019   Procedure: POLYPECTOMY;  Surgeon: Copping Rogelia, MD;  Location: [facility];  Service: Endoscopy;  Laterality: N/A;    Current Outpatient Medications  Medication Sig Dispense Refill   albuterol  (VENTOLIN  HFA) 108 (90 Base) MCG/ACT inhaler Inhale 2 puffs into the lungs every 6 (six) hours as needed for wheezing or shortness of breath. 8 g 2   alendronate  (FOSAMAX ) 70 MG tablet Take 1 tablet (70 mg total) by mouth every 7 (seven) days. Take with a full glass of water  on an empty stomach. 4 tablet 11   amLODipine  (NORVASC ) 10 MG tablet TAKE 1 TABLET(10 MG) BY MOUTH DAILY 90 tablet 0   aspirin  81 MG tablet Take 81 mg by mouth at bedtime.     atorvastatin  (LIPITOR) 40 MG tablet Take 1 tablet (40 mg total) by mouth at bedtime. 100 tablet 3   Cholecalciferol (VITAMIN D3) 50 MCG (2000 UT) CAPS Take by mouth daily.     Coenzyme Q10 (COQ10) 100 MG CAPS Take 1 capsule by mouth daily.       furosemide  (LASIX ) 20 MG tablet Take 1 tablet (20 mg) by mouth once  daily as needed for swelling 30 tablet 3   hydrALAZINE  (APRESOLINE ) 25 MG tablet Take 1 tablet (25 mg total) by mouth 3 (three) times daily. Take as needed for blood pressure greater then 140. 270 tablet 3   latanoprost  (XALATAN ) 0.005 % ophthalmic solution Place 1 drop into the right eye at bedtime.     meclizine  (ANTIVERT ) 25 MG tablet Take 1 tablet (25 mg total) by mouth 2 (two) times daily as needed for dizziness. 30 tablet 0   Multiple Vitamin (MULTIVITAMIN PO) Take 1 tablet by mouth daily.     nitroGLYCERIN  (NITROSTAT ) 0.4 MG SL tablet PLACE 1 TABLET UNDER THE TONGUE EVERY 5 MINUTES AS NEEDED FOR CHEST PAIN. MAX 3 TABLETS 25 tablet 1   Oxycodone  HCl 10 MG TABS Take 1-2 tablets (10-20 mg total) by mouth every 6 (six) hours as needed (moderate to severe pain). 120 tablet 0   saccharomyces boulardii (FLORASTOR) 250 MG capsule Take 250 mg by mouth 2 (two) times daily.  No current facility-administered medications for this visit.   Facility-Administered Medications Ordered in Other Visits  Medication Dose Route Frequency Provider Last Rate Last Admin   albuterol  (PROVENTIL ) (2.5 MG/3ML) 0.083% nebulizer solution 2.5 mg  2.5 mg Nebulization Once Stoneking, Hal, MD        Allergies:   Patient has no known allergies.   Social History:  The patient  reports that he has been smoking cigarettes. He started smoking about 65 years ago. He has a 16.4 pack-year smoking history. He has never used smokeless tobacco. He reports that he does not currently use alcohol. He reports that he does not use drugs.   Family History:   family history includes Alzheimer's disease in his maternal grandmother; Colon cancer in his maternal grandfather; Coronary artery disease in his brother; Heart failure in his mother; Hypertension in his brother and father; Kidney disease in his father.    Review of Systems: Review of Systems  Constitutional: Negative.   HENT: Negative.    Respiratory: Negative.     Cardiovascular: Negative.   Gastrointestinal: Negative.   Musculoskeletal: Negative.   Neurological: Negative.   Psychiatric/Behavioral: Negative.    All other systems reviewed and are negative.    PHYSICAL EXAM: VS:  BP 110/60 (BP Location: Left Arm, Patient Position: Sitting, Cuff Size: Normal)   Pulse 65   Ht 5' 8.5 (1.74 m)   Wt 166 lb 8 oz (75.5 kg)   SpO2 96%   BMI 24.95 kg/m  , BMI Body mass index is 24.95 kg/m. Constitutional:  oriented to person, place, and time. No distress.  HENT:  Head: Normocephalic and atraumatic.  Eyes:  no discharge. No scleral icterus.  Neck: Normal range of motion. Neck supple. No JVD present.  Cardiovascular: Normal rate, regular rhythm, normal heart sounds and intact distal pulses. Exam reveals no gallop and no friction rub. No edema No murmur heard. Pulmonary/Chest: Effort normal and breath sounds normal. No stridor. No respiratory distress.  no wheezes.  no rales.  no tenderness.  Abdominal: Soft.  no distension.  no tenderness.  Musculoskeletal: Normal range of motion.  no  tenderness or deformity.  Neurological:  normal muscle tone. Coordination normal. No atrophy Skin: Skin is warm and dry. No rash noted. not diaphoretic.  Psychiatric:  normal mood and affect. behavior is normal. Thought content normal.   Recent Labs: 11/10/2023: Hemoglobin 15.4; Platelets 249 12/06/2023: ALT 16 01/18/2024: BUN 17; Creatinine, Ser 1.00; Potassium 5.4; Sodium 140    Lipid Panel Lab Results  Component Value Date   CHOL 101 12/06/2023   HDL 40 12/06/2023   LDLCALC 29 12/06/2023   TRIG 200 (H) 12/06/2023      Wt Readings from Last 3 Encounters:  05/01/24 166 lb 8 oz (75.5 kg)  04/17/24 166 lb 2 oz (75.4 kg)  04/03/24 166 lb 8 oz (75.5 kg)     ASSESSMENT AND PLAN:  Problem List Items Addressed This Visit       Cardiology Problems   Ascending aortic aneurysm   Relevant Orders   EKG 12-Lead (Completed)   Coronary artery disease involving  native coronary artery of native heart with angina pectoris - Primary   Relevant Orders   EKG 12-Lead (Completed)   Stenosis of left subclavian artery     Other   Shortness of breath   Sleep apnea   Other Visit Diagnoses       Chronic combined systolic and diastolic heart failure (HCC)  Relevant Orders   EKG 12-Lead (Completed)     Essential hypertension       Relevant Orders   EKG 12-Lead (Completed)     Hyperlipidemia LDL goal <70         Tobacco abuse         Infrarenal abdominal aortic aneurysm (AAA) without rupture           Essential hypertension Blood pressure is well controlled on today's visit. No changes made to the medications. Trace ankle swelling likely exacerbated by amlodipine  If blood pressure continues to run low, could reduce the dose down to 5 mg daily  Right ankle swelling Venous duplex negative Already doing leg elevation and TED hose with ankle massage Reports Lasix  does not seem to make any difference - Possibly exacerbated by amlodipine   Coronary artery disease with stable angina Prior intervention greater than 20 years ago -High risk for worsening coronary disease, continues to smoke 1 pack/day - Reports having worsening shortness of breath Unable to exclude COPD versus ischemia -After further discussion, Myoview  ordered Unable to treadmill  Hyperlipidemia Cholesterol is at goal on the current lipid regimen. No changes to the medications were made.  PAD Nonobstructive carotid disease left subclavian stenosis, 4 mm cavernous right ICA aneurysm.    Signed, Velinda Lunger, M.D., Ph.D. [facility] [place], [place] [phone]

## 2024-05-01 ENCOUNTER — Ambulatory Visit: Attending: Cardiovascular Disease | Admitting: Cardiovascular Disease

## 2024-05-01 ENCOUNTER — Encounter: Payer: Self-pay | Admitting: Cardiovascular Disease

## 2024-05-01 VITALS — BP 110/60 | HR 65 | Ht 68.5 in | Wt 166.5 lb

## 2024-05-01 DIAGNOSIS — I5042 Chronic combined systolic (congestive) and diastolic (congestive) heart failure: Secondary | ICD-10-CM | POA: Diagnosis not present

## 2024-05-01 DIAGNOSIS — I771 Stricture of artery: Secondary | ICD-10-CM | POA: Diagnosis not present

## 2024-05-01 DIAGNOSIS — I7121 Aneurysm of the ascending aorta, without rupture: Secondary | ICD-10-CM

## 2024-05-01 DIAGNOSIS — G4730 Sleep apnea, unspecified: Secondary | ICD-10-CM | POA: Diagnosis not present

## 2024-05-01 DIAGNOSIS — I7143 Infrarenal abdominal aortic aneurysm, without rupture: Secondary | ICD-10-CM | POA: Diagnosis not present

## 2024-05-01 DIAGNOSIS — R0602 Shortness of breath: Secondary | ICD-10-CM | POA: Diagnosis not present

## 2024-05-01 DIAGNOSIS — Z720 Tobacco use: Secondary | ICD-10-CM | POA: Diagnosis not present

## 2024-05-01 DIAGNOSIS — I25119 Atherosclerotic heart disease of native coronary artery with unspecified angina pectoris: Secondary | ICD-10-CM | POA: Diagnosis not present

## 2024-05-01 DIAGNOSIS — E785 Hyperlipidemia, unspecified: Secondary | ICD-10-CM | POA: Diagnosis not present

## 2024-05-01 DIAGNOSIS — I10 Essential (primary) hypertension: Secondary | ICD-10-CM

## 2024-05-01 NOTE — Patient Instructions (Addendum)
Medication Instructions:  No changes  If you need a refill on your cardiac medications before your next appointment, please call your pharmacy.   Lab work: No new labs needed  Testing/Procedures: Your provider has ordered a Lexiscan/ Exercise Myoview Stress test. This will take place at [facility]. Please report to the [facility] medical mall entrance. The volunteers at the first desk will direct you where to go.  [facility] MYOVIEW  Your provider has ordered a Stress Test with nuclear imaging. The purpose of this test is to evaluate the blood supply to your heart muscle. This procedure is referred to as a "Non-Invasive Stress Test." This is because other than having an IV started in your vein, nothing is inserted or "invades" your body. Cardiac stress tests are done to find areas of poor blood flow to the heart by determining the extent of coronary artery disease (CAD). Some patients exercise on a treadmill, which naturally increases the blood flow to your heart, while others who are unable to walk on a treadmill due to physical limitations will have a pharmacologic/chemical stress agent called Lexiscan . This medicine will mimic walking on a treadmill by temporarily increasing your coronary blood flow.   Please note: these test may take anywhere between 2-4 hours to complete  How to prepare for your Myoview test:  Nothing to eat for 6 hours prior to the test No caffeine for 24 hours prior to test No smoking 24 hours prior to test. Your medication may be taken with water.  If your doctor stopped a medication because of this test, do not take that medication. Ladies, please do not wear dresses.  Skirts or pants are appropriate. Please wear a short sleeve shirt. No perfume, cologne or lotion. Wear comfortable walking shoes. No heels!   PLEASE NOTIFY THE OFFICE AT LEAST 24 HOURS IN ADVANCE IF YOU ARE UNABLE TO KEEP YOUR APPOINTMENT.  [phone] AND  PLEASE NOTIFY NUCLEAR MEDICINE AT [facility] AT LEAST 24 HOURS  IN ADVANCE IF YOU ARE UNABLE TO KEEP YOUR APPOINTMENT. [phone]   Follow-Up: At [facility], you and your health needs are our priority.  As part of our continuing mission to provide you with exceptional heart care, we have created designated Provider Care Teams.  These Care Teams include your primary Cardiologist (physician) and Advanced Practice Providers (APPs -  Physician Assistants and Nurse Practitioners) who all work together to provide you with the care you need, when you need it.  You will need a follow up appointment in 12 months  Providers on your designated Care Team:   Nicolasa Ducking, NP Eula Listen, PA-C Cadence Fransico Michael, [place]  COVID-19 Vaccine Information can be found at: [email or web address] For questions related to vaccine distribution or appointments, please email [email or web address] or call [phone].

## 2024-05-02 MED ORDER — OXYCODONE HCL 10 MG PO TABS: 10.0000 mg | ORAL_TABLET | Freq: Four times a day (QID) | ORAL | 0 refills | Status: DC | PRN

## 2024-05-03 DIAGNOSIS — G8929 Other chronic pain: Secondary | ICD-10-CM | POA: Diagnosis not present

## 2024-05-03 DIAGNOSIS — M6281 Muscle weakness (generalized): Secondary | ICD-10-CM | POA: Diagnosis not present

## 2024-05-03 DIAGNOSIS — M5441 Lumbago with sciatica, right side: Secondary | ICD-10-CM | POA: Diagnosis not present

## 2024-05-03 DIAGNOSIS — M5442 Lumbago with sciatica, left side: Secondary | ICD-10-CM | POA: Diagnosis not present

## 2024-05-04 ENCOUNTER — Other Ambulatory Visit: Payer: Self-pay | Admitting: Cardiovascular Disease

## 2024-05-05 DIAGNOSIS — G8929 Other chronic pain: Secondary | ICD-10-CM | POA: Diagnosis not present

## 2024-05-05 DIAGNOSIS — M5442 Lumbago with sciatica, left side: Secondary | ICD-10-CM | POA: Diagnosis not present

## 2024-05-05 DIAGNOSIS — M5441 Lumbago with sciatica, right side: Secondary | ICD-10-CM | POA: Diagnosis not present

## 2024-05-05 DIAGNOSIS — M6281 Muscle weakness (generalized): Secondary | ICD-10-CM | POA: Diagnosis not present

## 2024-05-08 DIAGNOSIS — M5442 Lumbago with sciatica, left side: Secondary | ICD-10-CM | POA: Diagnosis not present

## 2024-05-08 DIAGNOSIS — G8929 Other chronic pain: Secondary | ICD-10-CM | POA: Diagnosis not present

## 2024-05-08 DIAGNOSIS — M5441 Lumbago with sciatica, right side: Secondary | ICD-10-CM | POA: Diagnosis not present

## 2024-05-10 ENCOUNTER — Encounter
Admission: RE | Admit: 2024-05-10 | Discharge: 2024-05-10 | Disposition: A | Discharge status: Home / Self Care | Source: Ambulatory Visit | Attending: Cardiovascular Disease | Admitting: Cardiovascular Disease

## 2024-05-10 ENCOUNTER — Other Ambulatory Visit: Payer: Self-pay | Admitting: Physician Assistant

## 2024-05-10 DIAGNOSIS — I25119 Atherosclerotic heart disease of native coronary artery with unspecified angina pectoris: Secondary | ICD-10-CM | POA: Diagnosis not present

## 2024-05-10 DIAGNOSIS — R0602 Shortness of breath: Secondary | ICD-10-CM | POA: Insufficient documentation

## 2024-05-10 MED ORDER — TECHNETIUM TC 99M TETROFOSMIN IV KIT
30.0000 | PACK | Freq: Once | INTRAVENOUS | Status: AC | PRN
  Administered 2024-05-10: 32.41 via INTRAVENOUS

## 2024-05-10 MED ORDER — TECHNETIUM TC 99M TETROFOSMIN IV KIT
10.0000 | PACK | Freq: Once | INTRAVENOUS | Status: AC | PRN
Start: 2024-05-10 — End: 2024-05-10
  Administered 2024-05-10: 10.24 via INTRAVENOUS

## 2024-05-10 MED ORDER — REGADENOSON 0.4 MG/5ML IV SOLN
0.4000 mg | Freq: Once | INTRAVENOUS | Status: AC
  Administered 2024-05-10: 0.4 mg via INTRAVENOUS

## 2024-05-10 NOTE — Progress Notes (Signed)
     Brian [name] presented for a nuclear stress test today.  I Lesley LITTIE Maffucci, PA-C, provided direct supervision and was present during the stress portion of the study today, which was completed without significant symptoms, immediate complications, or acute ST/T changes on ECG.  Stress imaging is pending at this time.  Preliminary ECG findings may be listed in the chart, but the stress test result will not be finalized until perfusion imaging is complete.  Lesley LITTIE Maffucci, PA-C  05/10/2024, 10:03 AM

## 2024-05-11 ENCOUNTER — Encounter: Payer: Self-pay | Admitting: Student in an Organized Health Care Education/Training Program

## 2024-05-11 ENCOUNTER — Ambulatory Visit (INDEPENDENT_AMBULATORY_CARE_PROVIDER_SITE_OTHER): Admitting: Student in an Organized Health Care Education/Training Program

## 2024-05-11 VITALS — BP 124/72 | HR 78 | Temp 97.5°F | Ht 68.5 in | Wt 166.0 lb

## 2024-05-11 DIAGNOSIS — R053 Chronic cough: Secondary | ICD-10-CM

## 2024-05-11 DIAGNOSIS — J432 Centrilobular emphysema: Secondary | ICD-10-CM

## 2024-05-11 LAB — NM MYOCAR MULTI W/SPECT W/WALL MOTION / EF
LV dias vol: 142 mL (ref 62–150)
LV sys vol: 62 mL (ref 4.2–5.8)
MPHR: 137 {beats}/min
Nuc Stress EF: 56 %
Peak HR: 87 {beats}/min
Percent HR: 63 %
Rest HR: 67 {beats}/min
Rest Nuclear Isotope Dose: 10.2 mCi
SDS: 3
SRS: 26
SSS: 12
ST Depression (mm): 0 mm
Stress Nuclear Isotope Dose: 32.4 mCi
TID: 0.92

## 2024-05-11 LAB — NITRIC OXIDE: Nitric Oxide: 6

## 2024-05-11 MED ORDER — BUDESONIDE-FORMOTEROL FUMARATE 80-4.5 MCG/ACT IN AERO: 2.0000 | INHALATION_SPRAY | Freq: Two times a day (BID) | RESPIRATORY_TRACT | 12 refills | Status: DC

## 2024-05-11 NOTE — Patient Instructions (Addendum)
  VISIT SUMMARY: Today, you were seen for a persistent cough that has been ongoing for three to four months. The cough has changed from producing thick phlegm to being dry and tight, especially noticeable at night and in the mornings. You have tried using albuterol  without improvement. Your history of severe emphysema was considered in the evaluation.  YOUR PLAN: -CHRONIC COUGH IN THE SETTING OF EMPHYSEMA: Your chronic cough is likely related to your emphysema, which is a condition where the air sacs in your lungs are damaged, making it hard to breathe. The cough has recently become dry and tight. We recommend using a Symbicort inhaler, which contains a steroid and a bronchodilator to help reduce inflammation and open your airways. Please use two puffs twice daily. If your symptoms do not improve, we may need to do a CT scan of your lungs. Avoid using over-the-counter medications that contain sedatives for your cough.  INSTRUCTIONS: Please start using the Symbicort inhaler as prescribed: two puffs twice daily. Monitor your symptoms and if there is no improvement, we will consider a CT scan of your lungs. Avoid over-the-counter medications that contain sedatives. Follow up with us  if your symptoms persist or worsen.

## 2024-05-11 NOTE — Progress Notes (Signed)
 Assessment & Plan:   #Chronic cough (Primary) #Emphysema #HFmrEF #OSA  He presents with a chronic cough that has recently changed to a dry, tight cough with minimal sputum, more pronounced at night and in the morning. CT reveals severe emphysema, but pulmonary function tests do not indicate COPD with normal spirometry noted on PFT's from 03/2022.   Suspected underlying airway inflammation (previous chest CT's had shown mucus scattered through the airway) with current exacerbation of unclear cause. Will trial ICS/LABA, and avoid LAMA given his glaucoma.   Will consider repeating a CT scan of the lungs if there is no improvement with the inhaler. Recommended he avoid sedative-containing over-the-counter medications for cough.   - Nitric oxide doesn't show signs of eosinophilic inflammation - budesonide-formoterol (SYMBICORT) 80-4.5 MCG/ACT inhaler; Inhale 2 puffs into the lungs in the morning and at bedtime.  Dispense: 1 each; Refill: 12   Return in about 5 months (around 10/09/2024).  Brian November, MD [facility]  I spent 30 minutes caring for this patient today, including preparing to see the patient, obtaining a medical history , reviewing a separately obtained history, performing a medically appropriate examination and/or evaluation, counseling and educating the patient/family/caregiver, ordering medications, tests, or procedures, documenting clinical information in the electronic health record, and independently interpreting results (not separately reported/billed) and communicating results to the patient/family/caregiver  End of visit medications:  Meds ordered this encounter  Medications   budesonide-formoterol (SYMBICORT) 80-4.5 MCG/ACT inhaler    Sig: Inhale 2 puffs into the lungs in the morning and at bedtime.    Dispense:  1 each    Refill:  12     Current Outpatient Medications:    alendronate  (FOSAMAX ) 70 MG tablet, Take 1 tablet (70 mg total)  by mouth every 7 (seven) days. Take with a full glass of water  on an empty stomach., Disp: 4 tablet, Rfl: 11   amLODipine  (NORVASC ) 10 MG tablet, TAKE 1 TABLET(10 MG) BY MOUTH DAILY, Disp: 90 tablet, Rfl: 3   aspirin  81 MG tablet, Take 81 mg by mouth at bedtime., Disp: , Rfl:    atorvastatin  (LIPITOR) 40 MG tablet, Take 1 tablet (40 mg total) by mouth at bedtime., Disp: 100 tablet, Rfl: 3   budesonide-formoterol (SYMBICORT) 80-4.5 MCG/ACT inhaler, Inhale 2 puffs into the lungs in the morning and at bedtime., Disp: 1 each, Rfl: 12   Cholecalciferol (VITAMIN D3) 50 MCG (2000 UT) CAPS, Take by mouth daily., Disp: , Rfl:    Coenzyme Q10 (COQ10) 100 MG CAPS, Take 1 capsule by mouth daily.  , Disp: , Rfl:    dorzolamide (TRUSOPT) 2 % ophthalmic solution, Place 1 drop into the right eye 2 (two) times daily., Disp: , Rfl:    furosemide  (LASIX ) 20 MG tablet, Take 1 tablet (20 mg) by mouth once daily as needed for swelling, Disp: 30 tablet, Rfl: 3   hydrALAZINE  (APRESOLINE ) 25 MG tablet, Take 1 tablet (25 mg total) by mouth 3 (three) times daily. Take as needed for blood pressure greater then 140., Disp: 270 tablet, Rfl: 3   latanoprost  (XALATAN ) 0.005 % ophthalmic solution, Place 1 drop into the right eye at bedtime., Disp: , Rfl:    meclizine  (ANTIVERT ) 25 MG tablet, Take 1 tablet (25 mg total) by mouth 2 (two) times daily as needed for dizziness., Disp: 30 tablet, Rfl: 0   Multiple Vitamin (MULTIVITAMIN PO), Take 1 tablet by mouth daily., Disp: , Rfl:    nitroGLYCERIN  (NITROSTAT ) 0.4 MG SL tablet,  PLACE 1 TABLET UNDER THE TONGUE EVERY 5 MINUTES AS NEEDED FOR CHEST PAIN. MAX 3 TABLETS, Disp: 25 tablet, Rfl: 1   Oxycodone  HCl 10 MG TABS, Take 1-2 tablets (10-20 mg total) by mouth every 6 (six) hours as needed (moderate to severe pain)., Disp: 120 tablet, Rfl: 0   saccharomyces boulardii (FLORASTOR) 250 MG capsule, Take 250 mg by mouth 2 (two) times daily., Disp: , Rfl:    albuterol  (VENTOLIN  HFA) 108 (90  Base) MCG/ACT inhaler, Inhale 2 puffs into the lungs every 6 (six) hours as needed for wheezing or shortness of breath. (Patient not taking: Reported on 05/11/2024), Disp: 8 g, Rfl: 2 No current facility-administered medications for this visit.  Facility-Administered Medications Ordered in Other Visits:    albuterol  (PROVENTIL ) (2.5 MG/3ML) 0.083% nebulizer solution 2.5 mg, 2.5 mg, Nebulization, Once, Brian Coad, MD   Subjective:   PATIENT ID: Brian [name] GENDER: male DOB: [birth year], MRN: [id number]  Chief Complaint  Patient presents with   Follow-up    Cough with thick sputum. Cough started months ago. No SOB. No wheezing.  Albuterol - has not used    HPI  Discussed the use of AI scribe software for clinical note transcription with the patient, who gave verbal consent to proceed.  Brian [name] is an 83 year old male with emphysema who presents for the evaluation of a new cough.  Patient's past medical history is notable for OSA for which she was initiated on CPAP.  He also has a history of CAD, hypertension, hyperlipidemia, history of PE, and prediabetes.  Return Visit 10/13/2023:  He feels that his shortness of breath remains at baseline. He's short of breath with significant exertion (such as walking to the mailbo and back). Continues to have significant right lower foot edema (but not left) which is stable. He's been seen by cardiology and recommended PRN furosemide , but otherwise cardiac medications are unchanged. Repeat TTE is stable. He's also had his PFT's and is here to discuss results. He's used albuterol  PRN a few times but hasn't noticed an effect. He follows with Brian [name] for sleep and has had his CPAP pressures adjusted recently.   He did have an episode of increased cough, productive of sputum, as well as a runny nose around 3 weeks ago. Both him and his wife had similar symptoms.   Patient initially reported symptoms of dyspnea for over a year, mostly  with exertion. This was associated with cough that is productive of clear sputum.  The cough was mostly present at night. There is no hemoptysis, fevers, chills, or night sweats.  Return Visit 05/11/2024:  He developed a deep chest cough approximately three to four months ago, initially producing thick chunks of phlegm. Over time, the cough has become drier and less productive, now bringing up only small pieces of phlegm. The cough is described as tight and often occurs at night, sometimes waking him from sleep. He uses a CPAP machine and notes that the cough tends to worsen when he sits down to put it on, requiring several coughs before he can return to sleep. The cough is more noticeable in the mornings and tends to lessen as the day progresses, although he occasionally experiences severe episodes where he feels like he is 'trying to push every ounce of air out of my lungs.'  He tried using albuterol  for three weeks but did not notice any improvement in his shortness of breath. No recent illness or cold preceding the onset of  the cough, and his wife has not experienced similar symptoms. He describes his nose as somewhat dry but not stuffy, and he has a deviated septum. He has tried Mucinex in the past to help with the thick phlegm but did not notice significant improvement.  He has had multiple CT scans over the years, with the most recent scan being a year ago. He mentions using his wife's pulse oximeter, which typically shows his oxygen saturation at 96-97%.  He reports generally getting around six hours of sleep per night, sometimes up to seven hours, although he wakes up once or twice to use the bathroom and then experiences shorter sleep intervals until morning.     Patient does not report any occupational exposures but does report a significant smoking history.  He smoked half a pack a day for around 60 years with between 30 and 40 pack years of smoking history.  Patient also has a history of  glaucoma as well as a dislocated intraocular lens for which he has been followed closely with ophthalmology.  This was also complicated by ocular ischemia resulting in some vision loss.  Ancillary information including prior medications, full medical/surgical/family/social histories, and PFTs (when available) are listed below and have been reviewed.    Review of Systems  Constitutional:  Negative for chills, fever, malaise/fatigue and weight loss.  Respiratory:  Positive for cough. Negative for hemoptysis, sputum production, shortness of breath and wheezing.   Cardiovascular:  Negative for chest pain.     Objective:   Vitals:   05/11/24 1550  BP: 124/72  Pulse: 78  Temp: (!) 97.5 F (36.4 C)  SpO2: 92%  Weight: 166 lb (75.3 kg)  Height: 5' 8.5 (1.74 m)   92% on RA  BMI Readings from Last 3 Encounters:  05/11/24 24.87 kg/m  05/01/24 24.95 kg/m  04/17/24 26.81 kg/m   Wt Readings from Last 3 Encounters:  05/11/24 166 lb (75.3 kg)  05/01/24 166 lb 8 oz (75.5 kg)  04/17/24 166 lb 2 oz (75.4 kg)    Physical Exam Constitutional:      Appearance: Normal appearance.  Cardiovascular:     Rate and Rhythm: Normal rate and regular rhythm.     Pulses: Normal pulses.     Heart sounds: Normal heart sounds.  Pulmonary:     Effort: Pulmonary effort is normal.     Breath sounds: Wheezing (wheezing noted on end exhalation) present.  Neurological:     General: No focal deficit present.     Mental Status: He is alert and oriented to person, place, and time. Mental status is at baseline.       Ancillary Information    Past Medical History:  Diagnosis Date   AAA (abdominal aortic aneurysm)    Adenomatous polyp    Anxiety    Arthritis    Cancer (HCC) 2022   Carpal tunnel syndrome on left    Cataract    Cervical spine disease    Colitis    Coronary artery disease    S/p anterior MI tx with PCI in 1998  //  Myoview  6/22: EF 45, ant and septal scar; no ischemia;  intermediate risk // Echocardiogram 7/22: EF 45-50, ant-sept and apical HK, Gr 1 DD, normal RVSF, mild MR   Deviated septum    Emphysema of lung (HCC) 2024   Glaucoma 2023   Hyperlipidemia    Hypertension    Renal Artery US  12/2022: no RAS bilaterally   Ischemic cardiomyopathy 05/19/2022  Echocardiogram 06/02/22: EF 45-50, global HK, Gr 1 DD, NL RVSF, NL PASP (RVSP 26.4), trivial MR, RAP 3   Low back pain    Myocardial infarction [facility]) 1998   Personal history of pulmonary embolism    Pulmonary embolism (HCC) 08/26/2022   Sleep apnea    CPAP   Wears dentures    full upper     Family History  Problem Relation Age of Onset   Heart failure Mother    Kidney disease Father    Hypertension Father    Coronary artery disease Brother    Hypertension Brother    Alzheimer's disease Maternal Grandmother    Colon cancer Maternal Grandfather      Past Surgical History:  Procedure Laterality Date   APPENDECTOMY     AQUEOUS SHUNT Right 10/26/2022   Procedure: AHMED TUBE SHUNT WITH TUTOPLAST RIGHT;  Surgeon: Myrna Adine Anes, MD;  Location: [facility];  Service: Ophthalmology;  Laterality: Right;   bunions     CATARACT EXTRACTION     COLONOSCOPY WITH PROPOFOL  N/A 02/19/2015   Procedure: COLONOSCOPY WITH PROPOFOL ;  Surgeon: Rogelia Copping, MD;  Location: [facility];  Service: Endoscopy;  Laterality: N/A;   COLONOSCOPY WITH PROPOFOL  N/A 06/05/2019   Procedure: COLONOSCOPY WITH BIOPSY;  Surgeon: Copping Rogelia, MD;  Location: [facility];  Service: Endoscopy;  Laterality: N/A;  sleep apnea   ERCP N/A 08/25/2022   Procedure: ENDOSCOPIC RETROGRADE CHOLANGIOPANCREATOGRAPHY (ERCP);  Surgeon: Copping Rogelia, MD;  Location: [facility];  Service: Endoscopy;  Laterality: N/A;   EYE SURGERY     HEMORRHOID BANDING     HERNIA REPAIR     x3   IR KYPHO THORACIC WITH BONE BIOPSY  11/25/2023   IR RADIOLOGIST EVAL & MGMT  11/11/2023   OLECRANON BURSECTOMY Right    POLYPECTOMY N/A  06/05/2019   Procedure: POLYPECTOMY;  Surgeon: Copping Rogelia, MD;  Location: [facility];  Service: Endoscopy;  Laterality: N/A;    Social History   Socioeconomic History   Marital status: Married    Spouse name: [name]   Number of children: 1   Years of education: Not on file   Highest education level: Some college, no degree  Occupational History   Occupation: retired    Comment: Electronics engineer  Tobacco Use   Smoking status: Every Day    Current packs/day: 0.25    Average packs/day: 0.3 packs/day for 65.8 years (16.5 ttl pk-yrs)    Types: Cigarettes    Start date: 1960   Smokeless tobacco: Never   Tobacco comments:    Smokes 5-7 cigarettes a day- khj 05/11/2024        Started at 83 years old    Smoked 1PPD at his heaviest  Vaping Use   Vaping status: Never Used  Substance and Sexual Activity   Alcohol use: Not Currently    Comment: occasionally, none   Drug use: No   Sexual activity: Yes    Partners: Female  Other Topics Concern   Not on file  Social History Narrative   Not on file   Social Drivers of Health   Financial Resource Strain: Low Risk  (02/08/2024)   Received from [facility]   Overall Financial Resource Strain (CARDIA)    Difficulty of Paying Living Expenses: Not hard at all  Food Insecurity: No Food Insecurity (02/08/2024)   Received from [facility]   Hunger Vital Sign    Within the past 12 months,  you worried that your food would run out before you got the money to buy more.: Never true    Within the past 12 months, the food you bought just didn't last and you didn't have money to get more.: Never true  Transportation Needs: No Transportation Needs (02/08/2024)   Received from [facility] - Transportation    In the past 12 months, has lack of transportation kept you from medical appointments or from getting medications?: No    Lack of Transportation  (Non-Medical): No  Physical Activity: Insufficiently Active (01/17/2024)   Exercise Vital Sign    Days of Exercise per Week: 4 days    Minutes of Exercise per Session: 30 min  Stress: No Stress Concern Present (01/17/2024)   [employer] of Occupational Health - Occupational Stress Questionnaire    Feeling of Stress: Not at all  Social Connections: Moderately Integrated (01/17/2024)   Social Connection and Isolation Panel    Frequency of Communication with Friends and Family: More than three times a week    Frequency of Social Gatherings with Friends and Family: Once a week    Attends Religious Services: More than 4 times per year    Active Member of Clubs or Organizations: No    Attends Banker Meetings: Not on file    Marital Status: Married  Intimate Partner Violence: Not At Risk (05/25/2023)   Humiliation, Afraid, Rape, and Kick questionnaire    Fear of Current or Ex-Partner: No    Emotionally Abused: No    Physically Abused: No    Sexually Abused: No     No Known Allergies   CBC    Component Value Date/Time   WBC 9.2 11/10/2023 0000   RBC 5.17 11/10/2023 0000   HGB 15.4 11/10/2023 0000   HGB 14.9 02/01/2023 1520   HCT 46.1 11/10/2023 0000   HCT 43.5 02/01/2023 1520   PLT 249 11/10/2023 0000   PLT 253 02/01/2023 1520   MCV 89.2 11/10/2023 0000   MCV 88 02/01/2023 1520   MCV 91 02/26/2014 1727   MCH 29.8 11/10/2023 0000   MCHC 33.4 11/10/2023 0000   RDW 12.4 11/10/2023 0000   RDW 12.3 02/01/2023 1520   RDW 13.6 02/26/2014 1727   LYMPHSABS 2.0 02/01/2023 1520   MONOABS 1.6 (H) 08/26/2022 1501   EOSABS 221 11/10/2023 0000   EOSABS 0.3 02/01/2023 1520   BASOSABS 74 11/10/2023 0000   BASOSABS 0.1 02/01/2023 1520    Pulmonary Functions Testing Results:    Latest Ref Rng & Units 03/19/2022    3:14 PM  PFT Results  FVC-Pre L 3.90   FVC-Predicted Pre % 97   Pre FEV1/FVC % % 73   FEV1-Pre L 2.83   FEV1-Predicted Pre % 99   DLCO uncorrected  ml/min/mmHg 10.90   DLCO UNC% % 44   DLVA Predicted % 40   TLC L 6.93   TLC % Predicted % 98   RV % Predicted % 100     Outpatient Medications Prior to Visit  Medication Sig Dispense Refill   alendronate  (FOSAMAX ) 70 MG tablet Take 1 tablet (70 mg total) by mouth every 7 (seven) days. Take with a full glass of water  on an empty stomach. 4 tablet 11   amLODipine  (NORVASC ) 10 MG tablet TAKE 1 TABLET(10 MG) BY MOUTH DAILY 90 tablet 3   aspirin  81 MG tablet Take 81 mg by mouth at bedtime.     atorvastatin  (LIPITOR) 40  MG tablet Take 1 tablet (40 mg total) by mouth at bedtime. 100 tablet 3   Cholecalciferol (VITAMIN D3) 50 MCG (2000 UT) CAPS Take by mouth daily.     Coenzyme Q10 (COQ10) 100 MG CAPS Take 1 capsule by mouth daily.       dorzolamide (TRUSOPT) 2 % ophthalmic solution Place 1 drop into the right eye 2 (two) times daily.     furosemide  (LASIX ) 20 MG tablet Take 1 tablet (20 mg) by mouth once daily as needed for swelling 30 tablet 3   hydrALAZINE  (APRESOLINE ) 25 MG tablet Take 1 tablet (25 mg total) by mouth 3 (three) times daily. Take as needed for blood pressure greater then 140. 270 tablet 3   latanoprost  (XALATAN ) 0.005 % ophthalmic solution Place 1 drop into the right eye at bedtime.     meclizine  (ANTIVERT ) 25 MG tablet Take 1 tablet (25 mg total) by mouth 2 (two) times daily as needed for dizziness. 30 tablet 0   Multiple Vitamin (MULTIVITAMIN PO) Take 1 tablet by mouth daily.     nitroGLYCERIN  (NITROSTAT ) 0.4 MG SL tablet PLACE 1 TABLET UNDER THE TONGUE EVERY 5 MINUTES AS NEEDED FOR CHEST PAIN. MAX 3 TABLETS 25 tablet 1   Oxycodone  HCl 10 MG TABS Take 1-2 tablets (10-20 mg total) by mouth every 6 (six) hours as needed (moderate to severe pain). 120 tablet 0   saccharomyces boulardii (FLORASTOR) 250 MG capsule Take 250 mg by mouth 2 (two) times daily.     albuterol  (VENTOLIN  HFA) 108 (90 Base) MCG/ACT inhaler Inhale 2 puffs into the lungs every 6 (six) hours as needed for  wheezing or shortness of breath. (Patient not taking: Reported on 05/11/2024) 8 g 2   Facility-Administered Medications Prior to Visit  Medication Dose Route Frequency Provider Last Rate Last Admin   albuterol  (PROVENTIL ) (2.5 MG/3ML) 0.083% nebulizer solution 2.5 mg  2.5 mg Nebulization Once Stoneking, Hal, MD

## 2024-05-20 ENCOUNTER — Ambulatory Visit: Payer: Self-pay | Admitting: Cardiovascular Disease

## 2024-05-22 ENCOUNTER — Encounter: Admitting: Family Medicine

## 2024-05-22 ENCOUNTER — Encounter: Payer: Self-pay | Admitting: Emergency Medicine

## 2024-05-23 NOTE — Progress Notes (Signed)
 This encounter was created in error - please disregard.

## 2024-05-26 ENCOUNTER — Telehealth: Payer: Self-pay

## 2024-05-26 NOTE — Telephone Encounter (Signed)
 Spoke with patient, patient will keep his appointment with us  on 11/12 and after his follow up with PMR on 11/10 if they decide to proceed with RFA and patient wants to hold off on follow up with us  until that is done he will call us  back and reschedule

## 2024-05-26 NOTE — Telephone Encounter (Signed)
-----   Message from HILMA HASTINGS M sent at 05/26/2024  9:17 AM EST ----- Either way is fine with me. Can see what he prefers to do. ----- Message ----- From: Girard Don GAILS, CMA Sent: 05/26/2024   9:07 AM EST To: Hastings Hilma, PA-C  Patient has an appt next week, he did have 2 MBB injections and has follow up with PMR on 11/10-do you want him to come on 11/12 or wait until after his possible RFA to be scheduled and go through with that first?

## 2024-05-28 NOTE — Progress Notes (Deleted)
 Referring Physician:  [name], MD [address] [place],  [place] [address]  Primary Physician:  [name], MD  History of Present Illness: Mr. Saxton Chain has a history of ischemic bowel disease, MI, HTN, CAD, ischemic cardiomyopathy, history of PE, sleep apnea, hyperlipidemia, chronic pain syndrome, skin CA.   He has known T12 compression fracture that likely occurred around 09/09/23. He had kyphoplasty procedure at T12 on 11/25/23 with improvement in pain.   He also has known underlying lumbar scoliosis, spondylosis, and multilevel foraminal stenosis. History of right drop foot years ago and he still has weakness.   Last seen by me on 04/03/24- he was to follow up with Dr. Dodson regarding repeat MBB/RFA and continue with PT.   He had his repeat MBB, he is scheduled for RFA on ***.       He had initial eval with PT on 02/08/24 and has done 8 more visits through 03/23/24.   He had bilateral L3, L4, L5 MBB on 03/27/24.   He is here for follow up.    He continues on oxycodone  and baclofen  from his PCP. Has not been taking baclofen .   He is caregiver for his wife.   He smokes 1/2 PPD x 60+ years. He recently quit, but smokes intermittently.    Conservative measures:  Physical therapy: initial eval at [facility] on 02/08/24 with 8 visits through 03/23/24 Injections:  05/15/24 bilateral MBB L3, L4, L5 03/27/24 bilateral MBB L3, L4, L5 01/31/24 bilateral S1 TF ESI  01/03/24 bilateral S1 TF ESI   Surgery:  Kyphoplasty T12 on 11/25/23  The symptoms are causing a significant impact on the patient's life.   Review of Systems:  A 10 point review of systems is negative, except for the pertinent positives and negatives detailed in the HPI.  Past Medical History: Past Medical History:  Diagnosis Date   AAA (abdominal aortic aneurysm)    Adenomatous polyp    Anxiety    Arthritis    Cancer (HCC) 2022   Carpal tunnel syndrome on left    Cataract     Cervical spine disease    Colitis    Coronary artery disease    S/p anterior MI tx with PCI in 1998  //  Myoview  6/22: EF 45, ant and septal scar; no ischemia; intermediate risk // Echocardiogram 7/22: EF 45-50, ant-sept and apical HK, Gr 1 DD, normal RVSF, mild MR   Deviated septum    Emphysema of lung (HCC) 2024   Glaucoma 2023   Hyperlipidemia    Hypertension    Renal Artery US  12/2022: no RAS bilaterally   Ischemic cardiomyopathy 05/19/2022   Echocardiogram 06/02/22: EF 45-50, global HK, Gr 1 DD, NL RVSF, NL PASP (RVSP 26.4), trivial MR, RAP 3   Low back pain    Myocardial infarction (HCC) 1998   Personal history of pulmonary embolism    Pulmonary embolism (HCC) 08/26/2022   Sleep apnea    CPAP   Wears dentures    full upper    Past Surgical History: Past Surgical History:  Procedure Laterality Date   APPENDECTOMY     AQUEOUS SHUNT Right 10/26/2022   Procedure: AHMED TUBE SHUNT WITH TUTOPLAST RIGHT;  Surgeon: Myrna Adine Anes, MD;  Location: [facility];  Service: Ophthalmology;  Laterality: Right;   bunions     CATARACT EXTRACTION     COLONOSCOPY WITH PROPOFOL  N/A 02/19/2015   Procedure: COLONOSCOPY WITH PROPOFOL ;  Surgeon: Rogelia Copping, MD;  Location: [facility] ENDOSCOPY;  Service: Endoscopy;  Laterality: N/A;   COLONOSCOPY WITH PROPOFOL  N/A 06/05/2019   Procedure: COLONOSCOPY WITH BIOPSY;  Surgeon: Jinny Carmine, MD;  Location: [facility];  Service: Endoscopy;  Laterality: N/A;  sleep apnea   ERCP N/A 08/25/2022   Procedure: ENDOSCOPIC RETROGRADE CHOLANGIOPANCREATOGRAPHY (ERCP);  Surgeon: Jinny Carmine, MD;  Location: [facility] ENDOSCOPY;  Service: Endoscopy;  Laterality: N/A;   EYE SURGERY     HEMORRHOID BANDING     HERNIA REPAIR     x3   IR KYPHO THORACIC WITH BONE BIOPSY  11/25/2023   IR RADIOLOGIST EVAL & MGMT  11/11/2023   OLECRANON BURSECTOMY Right    POLYPECTOMY N/A 06/05/2019   Procedure: POLYPECTOMY;  Surgeon: Jinny Carmine, MD;  Location: [facility]  SUR[facility]ERY [facility];  Service: Endoscopy;  Laterality: N/A;    Allergies: Allergies as of 05/31/2024   (No Known Allergies)    Medications: Outpatient Encounter Medications as of 05/31/2024  Medication Sig   albuterol  (VENTOLIN  HFA) 108 (90 Base) MCG/ACT inhaler Inhale 2 puffs into the lungs every 6 (six) hours as needed for wheezing or shortness of breath. (Patient not taking: Reported on 05/11/2024)   alendronate  (FOSAMAX ) 70 MG tablet Take 1 tablet (70 mg total) by mouth every 7 (seven) days. Take with a full glass of water  on an empty stomach.   amLODipine  (NORVASC ) 10 MG tablet TAKE 1 TABLET(10 MG) BY MOUTH DAILY   aspirin  81 MG tablet Take 81 mg by mouth at bedtime.   atorvastatin  (LIPITOR) 40 MG tablet Take 1 tablet (40 mg total) by mouth at bedtime.   budesonide-formoterol (SYMBICORT) 80-4.5 MCG/ACT inhaler Inhale 2 puffs into the lungs in the morning and at bedtime.   Cholecalciferol (VITAMIN D3) 50 MCG (2000 UT) CAPS Take by mouth daily.   Coenzyme Q10 (COQ10) 100 MG CAPS Take 1 capsule by mouth daily.     dorzolamide (TRUSOPT) 2 % ophthalmic solution Place 1 drop into the right eye 2 (two) times daily.   furosemide  (LASIX ) 20 MG tablet Take 1 tablet (20 mg) by mouth once daily as needed for swelling   hydrALAZINE  (APRESOLINE ) 25 MG tablet Take 1 tablet (25 mg total) by mouth 3 (three) times daily. Take as needed for blood pressure greater then 140.   latanoprost  (XALATAN ) 0.005 % ophthalmic solution Place 1 drop into the right eye at bedtime.   meclizine  (ANTIVERT ) 25 MG tablet Take 1 tablet (25 mg total) by mouth 2 (two) times daily as needed for dizziness.   Multiple Vitamin (MULTIVITAMIN PO) Take 1 tablet by mouth daily.   nitroGLYCERIN  (NITROSTAT ) 0.4 MG SL tablet PLACE 1 TABLET UNDER THE TONGUE EVERY 5 MINUTES AS NEEDED FOR CHEST PAIN. MAX 3 TABLETS   Oxycodone  HCl 10 MG TABS Take 1-2 tablets (10-20 mg total) by mouth every 6 (six) hours as needed (moderate to severe pain).    saccharomyces boulardii (FLORASTOR) 250 MG capsule Take 250 mg by mouth 2 (two) times daily.   Facility-Administered Encounter Medications as of 05/31/2024  Medication   albuterol  (PROVENTIL ) (2.5 MG/3ML) 0.083% nebulizer solution 2.5 mg    Social History: Social History   Tobacco Use   Smoking status: Every Day    Current packs/day: 0.25    Average packs/day: 0.3 packs/day for 65.9 years (16.5 ttl pk-yrs)    Types: Cigarettes    Start date: 1960   Smokeless tobacco: Never   Tobacco comments:    Smokes 5-7 cigarettes a day- khj 05/11/2024  Started at 83 years old    Smoked 1PPD at his heaviest  Vaping Use   Vaping status: Never Used  Substance Use Topics   Alcohol use: Not Currently    Comment: occasionally, none   Drug use: No    Family Medical History: Family History  Problem Relation Age of Onset   Heart failure Mother    Kidney disease Father    Hypertension Father    Coronary artery disease Brother    Hypertension Brother    Alzheimer's disease Maternal Grandmother    Colon cancer Maternal Grandfather     Physical Examination: There were no vitals filed for this visit.   Awake, alert, oriented to person, place, and time.  Speech is clear and fluent. Fund of knowledge is appropriate.   Cranial Nerves: Pupils equal round and reactive to light.  Facial tone is symmetric.    No tenderness TL junction. No lower lumbar tenderness.   No abnormal lesions on exposed skin.   Strength: Side Iliopsoas Quads Hamstring PF DF EHL  R 5 5 5 5 3 3   L 5 5 5 5 5 5    Right foot drop is chronic per patient.   Reflexes are 2+ and symmetric at the patella and achilles, but right achilles is diminished.     Bilateral lower extremity sensation is intact to light touch, slightly diminished in both legs from knees down to feet.   Gait is slow.   Medical Decision Making  Imaging: none  Assessment and Plan: Mr. Barnhard has known T12 compression fracture that  likely occurred around 09/09/23. He had kyphoplasty procedure at T12 on 11/25/23 with improvement in pain.   LBP has improved since MBB (was a 7 and is now a 3). He still has constant LBP with no leg pain. No numbness or tingling. Has known right foot drop x years.   He has known underlying lumbar scoliosis, spondylosis, and multilevel foraminal stenosis.   Treatment options discussed with patient and following plan made:   - Continue with PT for lumbar spine. He will call [facility] and they will contact Dr. Dodson if any further orders needed.  - Follow up with Dr. Dodson as scheduled to discuss repeat MBB/RFA.  - Continue on oxycodone  and baclofen  from PCP.  - Follow up with  me in 6-8 weeks and prn.   I spent a total of 15 minutes in face-to-face and non-face-to-face activities related to this patient's care today including review of outside records, review of imaging, review of symptoms, physical exam, discussion of differential diagnosis, discussion of treatment options, and documentation.   [name] PA-C Dept. of Neurosurgery

## 2024-05-29 ENCOUNTER — Ambulatory Visit: Admitting: Orthopedic Surgery

## 2024-05-30 ENCOUNTER — Ambulatory Visit (INDEPENDENT_AMBULATORY_CARE_PROVIDER_SITE_OTHER): Payer: Self-pay

## 2024-05-30 VITALS — BP 132/80 | Ht 68.5 in | Wt 171.0 lb

## 2024-05-30 DIAGNOSIS — Z0000 Encounter for general adult medical examination without abnormal findings: Secondary | ICD-10-CM | POA: Diagnosis not present

## 2024-05-30 NOTE — Progress Notes (Signed)
 Subjective:   Brian [name] is a 83 y.o. male who presents for a Medicare Annual Wellness Visit.  Allergies (verified) Patient has no known allergies.   History: Past Medical History:  Diagnosis Date   AAA (abdominal aortic aneurysm)    Adenomatous polyp    Anxiety    Arthritis    Cancer (HCC) 2022   Carpal tunnel syndrome on left    Cataract    Cervical spine disease    Colitis    Coronary artery disease    S/p anterior MI tx with PCI in 1998  //  Myoview  6/22: EF 45, ant and septal scar; no ischemia; intermediate risk // Echocardiogram 7/22: EF 45-50, ant-sept and apical HK, Gr 1 DD, normal RVSF, mild MR   Deviated septum    Emphysema of lung (HCC) 2024   Glaucoma 2023   Hyperlipidemia    Hypertension    Renal Artery US  12/2022: no RAS bilaterally   Ischemic cardiomyopathy 05/19/2022   Echocardiogram 06/02/22: EF 45-50, global HK, Gr 1 DD, NL RVSF, NL PASP (RVSP 26.4), trivial MR, RAP 3   Low back pain    Myocardial infarction (HCC) 1998   Personal history of pulmonary embolism    Pulmonary embolism (HCC) 08/26/2022   Sleep apnea    CPAP   Wears dentures    full upper   Past Surgical History:  Procedure Laterality Date   APPENDECTOMY     AQUEOUS SHUNT Right 10/26/2022   Procedure: AHMED TUBE SHUNT WITH TUTOPLAST RIGHT;  Surgeon: Myrna Adine Anes, MD;  Location: [facility];  Service: Ophthalmology;  Laterality: Right;   bunions     CATARACT EXTRACTION     COLONOSCOPY WITH PROPOFOL  N/A 02/19/2015   Procedure: COLONOSCOPY WITH PROPOFOL ;  Surgeon: Rogelia Copping, MD;  Location: [facility] ENDOSCOPY;  Service: Endoscopy;  Laterality: N/A;   COLONOSCOPY WITH PROPOFOL  N/A 06/05/2019   Procedure: COLONOSCOPY WITH BIOPSY;  Surgeon: Copping Rogelia, MD;  Location: [facility];  Service: Endoscopy;  Laterality: N/A;  sleep apnea   ERCP N/A 08/25/2022   Procedure: ENDOSCOPIC RETROGRADE CHOLANGIOPANCREATOGRAPHY (ERCP);  Surgeon: Copping Rogelia, MD;  Location: [facility]  ENDOSCOPY;  Service: Endoscopy;  Laterality: N/A;   EYE SURGERY     HEMORRHOID BANDING     HERNIA REPAIR     x3   IR KYPHO THORACIC WITH BONE BIOPSY  11/25/2023   IR RADIOLOGIST EVAL & MGMT  11/11/2023   OLECRANON BURSECTOMY Right    POLYPECTOMY N/A 06/05/2019   Procedure: POLYPECTOMY;  Surgeon: Copping Rogelia, MD;  Location: [facility];  Service: Endoscopy;  Laterality: N/A;   Family History  Problem Relation Age of Onset   Heart failure Mother    Kidney disease Father    Hypertension Father    Coronary artery disease Brother    Hypertension Brother    Alzheimer's disease Maternal Grandmother    Colon cancer Maternal Grandfather    Social History   Occupational History   Occupation: retired    Comment: Electronics engineer  Tobacco Use   Smoking status: Every Day    Current packs/day: 0.25    Average packs/day: 0.3 packs/day for 65.9 years (16.5 ttl pk-yrs)    Types: Cigarettes    Start date: 1961   Smokeless tobacco: Never   Tobacco comments:    Smokes 5-7 cigarettes a day- khj 05/11/2024        Started at 83 years old    Smoked 1PPD at his heaviest  Vaping Use   Vaping status: Never Used  Substance and Sexual Activity   Alcohol use: Not Currently    Comment: occasionally, none   Drug use: No   Sexual activity: Yes    Partners: Female   Tobacco Counseling Ready to quit: Not Answered Counseling given: Not Answered Tobacco comments: Smokes 5-7 cigarettes a day- khj 05/11/2024  Started at 83 years old Smoked 1PPD at his heaviest  SDOH Screenings   Food Insecurity: No Food Insecurity (02/08/2024)   Received from [facility]  Housing: Low Risk  (02/08/2024)   Received from [facility]  Transportation Needs: No Transportation Needs (02/08/2024)   Received from [facility]  Utilities: Not At Risk (02/08/2024)   Received from [facility]  Alcohol Screen: Low Risk   (05/25/2023)  Depression (PHQ2-9): Low Risk  (09/14/2023)  Financial Resource Strain: Low Risk  (02/08/2024)   Received from [facility]  Physical Activity: Insufficiently Active (01/17/2024)  Social Connections: Moderately Integrated (01/17/2024)  Stress: No Stress Concern Present (01/17/2024)  Tobacco Use: High Risk (05/30/2024)  Health Literacy: Adequate Health Literacy (05/25/2023)   Depression Screen    09/14/2023    2:42 PM 05/25/2023    3:13 PM 02/11/2023   11:05 AM 11/30/2022    2:16 PM 09/22/2022    4:09 PM  PHQ 2/9 Scores  PHQ - 2 Score 0 1 0  0  PHQ- 9 Score 0  1  0   0   Exception Documentation    Patient refusal      Data saved with a previous flowsheet row definition     Goals Addressed   None    Functional Status Ambulation: Independent  Fall Screening Falls in the past year?: 0 Number of falls in past year: 0 Was there an injury with Fall?: 0 Fall Risk Category Calculator: 0 Patient Fall Risk Level: Low Fall Risk  Fall Risk Patient at Risk for Falls Due to: No Fall Risks Fall risk Follow up: Falls evaluation completed        Objective:    Today's Vitals   05/30/24 1442  BP: 132/80  Weight: 171 lb (77.6 kg)  Height: 5' 8.5 (1.74 m)   Body mass index is 25.62 kg/m.  Current Medications (verified) Outpatient Encounter Medications as of 05/30/2024  Medication Sig   albuterol  (VENTOLIN  HFA) 108 (90 Base) MCG/ACT inhaler Inhale 2 puffs into the lungs every 6 (six) hours as needed for wheezing or shortness of breath. (Patient not taking: Reported on 05/11/2024)   alendronate  (FOSAMAX ) 70 MG tablet Take 1 tablet (70 mg total) by mouth every 7 (seven) days. Take with a full glass of water  on an empty stomach.   amLODipine  (NORVASC ) 10 MG tablet TAKE 1 TABLET(10 MG) BY MOUTH DAILY   aspirin  81 MG tablet Take 81 mg by mouth at bedtime.   atorvastatin  (LIPITOR) 40 MG tablet Take 1 tablet (40 mg total) by mouth at bedtime.    budesonide-formoterol (SYMBICORT) 80-4.5 MCG/ACT inhaler Inhale 2 puffs into the lungs in the morning and at bedtime.   Cholecalciferol (VITAMIN D3) 50 MCG (2000 UT) CAPS Take by mouth daily.   Coenzyme Q10 (COQ10) 100 MG CAPS Take 1 capsule by mouth daily.     dorzolamide (TRUSOPT) 2 % ophthalmic solution Place 1 drop into the right eye 2 (two) times daily.   furosemide  (LASIX ) 20 MG tablet Take 1 tablet (20 mg) by mouth once daily as  needed for swelling   hydrALAZINE  (APRESOLINE ) 25 MG tablet Take 1 tablet (25 mg total) by mouth 3 (three) times daily. Take as needed for blood pressure greater then 140.   latanoprost  (XALATAN ) 0.005 % ophthalmic solution Place 1 drop into the right eye at bedtime.   meclizine  (ANTIVERT ) 25 MG tablet Take 1 tablet (25 mg total) by mouth 2 (two) times daily as needed for dizziness.   Multiple Vitamin (MULTIVITAMIN PO) Take 1 tablet by mouth daily.   nitroGLYCERIN  (NITROSTAT ) 0.4 MG SL tablet PLACE 1 TABLET UNDER THE TONGUE EVERY 5 MINUTES AS NEEDED FOR CHEST PAIN. MAX 3 TABLETS   Oxycodone  HCl 10 MG TABS Take 1-2 tablets (10-20 mg total) by mouth every 6 (six) hours as needed (moderate to severe pain).   saccharomyces boulardii (FLORASTOR) 250 MG capsule Take 250 mg by mouth 2 (two) times daily.   Facility-Administered Encounter Medications as of 05/30/2024  Medication   albuterol  (PROVENTIL ) (2.5 MG/3ML) 0.083% nebulizer solution 2.5 mg   Hearing/Vision screen No results found. Immunizations and Health Maintenance Health Maintenance  Topic Date Due   Zoster Vaccines- Shingrix (1 of 2) 07/01/1960   DTaP/Tdap/Td (1 - Tdap) 12/20/2003   Colonoscopy  06/04/2020   Medicare Annual Wellness (AWV)  05/24/2024   COVID-19 Vaccine (10 - 2025-26 season) 06/02/2024   Pneumococcal Vaccine: 50+ Years  Completed   Influenza Vaccine  Completed   Meningococcal B Vaccine  Aged Out        Assessment/Plan:  This is a routine wellness examination for Brian [name].  Patient  Care Team: [name], MD as PCP - General (Family Medicine) Claudene Victory ORN, MD (Inactive) as PCP - Cardiology (Cardiology) Myrna Adine Anes, MD as Consulting Physician (Ophthalmology) Isaiah Scrivener, MD as Consulting Physician (Pulmonary Disease) Isadora Hose, MD as Consulting Physician (Pulmonary Disease)  I have personally reviewed and noted the following in the patient's chart:   Medical and social history Use of alcohol, tobacco or illicit drugs  Current medications and supplements including opioid prescriptions. Functional ability and status Nutritional status Physical activity Advanced directives List of other physicians Hospitalizations, surgeries, and ER visits in previous 12 months Vitals Screenings to include cognitive, depression, and falls Referrals and appointments  No orders of the defined types were placed in this encounter.  In addition, I have reviewed and discussed with patient certain preventive protocols, quality metrics, and best practice recommendations. A written personalized care plan for preventive services as well as general preventive health recommendations were provided to patient.   Brian GORMAN Das, LPN   [id number]   No follow-ups on file.  After Visit Summary: (In Person-Declined) Patient declined AVS at this time.  Nurse Notes: UTD ON SHOTS EXCEPT TDAP; AGED OUT OF COLONOSCOPY

## 2024-05-30 NOTE — Patient Instructions (Addendum)
 Mr. Byas,  Thank you for taking the time for your Medicare Wellness Visit. I appreciate your continued commitment to your health goals. Please review the care plan we discussed, and feel free to reach out if I can assist you further.  Please note that Annual Wellness Visits do not include a physical exam. Some assessments may be limited, especially if the visit was conducted virtually. If needed, we may recommend an in-person follow-up with your provider.  Ongoing Care Seeing your primary care provider every 3 to 6 months helps us  monitor your health and provide consistent, personalized care.   Referrals If a referral was made during today's visit and you haven't received any updates within two weeks, please contact the referred provider directly to check on the status.  Recommended Screenings:  Health Maintenance  Topic Date Due   Zoster (Shingles) Vaccine (1 of 2) 07/01/1960   DTaP/Tdap/Td vaccine (1 - Tdap) 12/20/2003   Colon Cancer Screening  06/04/2020   COVID-19 Vaccine (10 - 2025-26 season) 06/02/2024   Medicare Annual Wellness Visit  05/30/2025   Pneumococcal Vaccine for age over 41  Completed   Flu Shot  Completed   Meningitis B Vaccine  Aged Out     Vision: Annual vision screenings are recommended for early detection of glaucoma, cataracts, and diabetic retinopathy. These exams can also reveal signs of chronic conditions such as diabetes and high blood pressure.  Dental: Annual dental screenings help detect early signs of oral cancer, gum disease, and other conditions linked to overall health, including heart disease and diabetes.  Please see the attached documents for additional preventive care recommendations.   NEXT AWV 06/05/25 @ 2:30 PM IN PERSON Take care & I will see ya next year/Dariya Gainer

## 2024-05-31 ENCOUNTER — Ambulatory Visit: Admitting: Orthopedic Surgery

## 2024-06-08 ENCOUNTER — Encounter: Payer: Self-pay | Admitting: Internal Medicine

## 2024-06-08 ENCOUNTER — Ambulatory Visit: Admitting: Internal Medicine

## 2024-06-08 VITALS — BP 120/80 | HR 75 | Temp 97.8°F | Ht 68.5 in | Wt 165.0 lb

## 2024-06-08 DIAGNOSIS — G4733 Obstructive sleep apnea (adult) (pediatric): Secondary | ICD-10-CM

## 2024-06-08 DIAGNOSIS — Z86711 Personal history of pulmonary embolism: Secondary | ICD-10-CM | POA: Diagnosis not present

## 2024-06-08 DIAGNOSIS — F17210 Nicotine dependence, cigarettes, uncomplicated: Secondary | ICD-10-CM

## 2024-06-08 DIAGNOSIS — I2510 Atherosclerotic heart disease of native coronary artery without angina pectoris: Secondary | ICD-10-CM | POA: Diagnosis not present

## 2024-06-08 DIAGNOSIS — J439 Emphysema, unspecified: Secondary | ICD-10-CM | POA: Diagnosis not present

## 2024-06-08 DIAGNOSIS — J4520 Mild intermittent asthma, uncomplicated: Secondary | ICD-10-CM

## 2024-06-08 NOTE — Progress Notes (Signed)
 Name: Brian [name] MRN: [id number] DOB: [birth year]    SYNOPSIS Patient was admitted to the ICU  for E. coli sepsis with metabolic encephalopathy with acute liver failure patient underwent ERCP for gallbladder disease found to have sludge There is residual imbalance Patient does have some shortness of breath and dyspnea on exertion Patient is a longtime smoker 1 pack a day for the last 70 years Patient quit tobacco abuse last week Patient had a history of coronary artery disease with 2 stents placed Previously had been seeing Dr. Claudene cardiology in [place] Patient also on oral anticoagulation for diagnosis of PE    CHIEF COMPLAINT:  Follow-up assessment for OSA Follow-up assessment for reactive airways disease  HISTORY OF PRESENT ILLNESS: Follow-up assessment for OSA Patient is very compliant with his CPAP His previous AHI was 98.4 and I increase his CPAP prescription to 9 cm of water  pressure Since increasing his CPAP to 9 patient is AHI reduced to 8, ALTHOUGH THIS IS VERY MINIMAL CHANGE HE IS VERY HAPPY WITH SETTINGS AND CPAP THERAPY    Discussed sleep data and reviewed with patient.  Encouraged proper weight management.  Discussed driving precautions and its relationship with hypersomnolence.  Discussed sleep hygiene, and benefits of a fixed sleep waked time.  The importance of getting eight or more hours of sleep discussed with patient.  Discussed limiting the use of the computer and television before bedtime.  Decrease naps during the day, so night time sleep will become enhanced.  Limit caffeine, and sleep deprivation.   Patient uses and benefits from therapy Using CPAP nightly and with naps Pressure setting is comfortable and is sleeping well. much more satisfied with this improvement Compliance report reviewed in detail with patient Patient is feeling well overall Patient uses and benefits from therapy Patient now has a mask that fits well   PAIN  ISSUES Patient currently has a T12 spinal fracture I have explained to him the pain could inhibit sleep quality Patient is currently on oxycodone  I have also explained that pain could hinder breathing I recommended breathing exercises Since he smokes he has intermittent cough Will provide albuterol  as needed  History of CAD No exacerbation at this time No evidence of heart failure at this time No evidence or signs of infection at this time No respiratory distress No fevers, chills, nausea, vomiting, diarrhea No evidence of lower extremity edema No evidence hemoptysis   Emphysema on CT scan No significant respiratory issues at this time Patient is not on any inhalers Patient is very active for his age  HISTORY OF PE Patient with a history of pulmonary embolism Weaned off of anticoagulation   PAST MEDICAL HISTORY :   has a past medical history of AAA (abdominal aortic aneurysm), Adenomatous polyp, Anxiety, Arthritis, Cancer (HCC) (2022), Carpal tunnel syndrome on left, Cataract, Cervical spine disease, Colitis, Coronary artery disease, Deviated septum, Emphysema of lung (HCC) (2024), Glaucoma (2023), Hyperlipidemia, Hypertension, Ischemic cardiomyopathy (05/19/2022), Low back pain, Myocardial infarction (HCC) (1998), Personal history of pulmonary embolism, Pulmonary embolism (HCC) (08/26/2022), Sleep apnea, and Wears dentures.  has a past surgical history that includes Appendectomy; Hernia repair; Hemorrhoid banding; bunions; Cataract extraction; Colonoscopy with propofol  (N/A, 02/19/2015); Colonoscopy with propofol  (N/A, 06/05/2019); polypectomy (N/A, 06/05/2019); ERCP (N/A, 08/25/2022); Olecranon bursectomy (Right); Aqueous shunt (Right, 10/26/2022); IR Radiologist Eval & Mgmt (11/11/2023); IR KYPHO THORACIC WITH BONE BIOPSY (11/25/2023); and Eye surgery. Prior to Admission medications   Medication Sig Start Date End Date Taking? Authorizing Provider  apixaban  (  ELIQUIS ) 5 MG TABS  tablet Take 2 tablets (10 mg total) by mouth 2 (two) times daily for 7 days, THEN 1 tablet (5 mg total) 2 (two) times daily. 08/29/22 11/27/22  Jhonny Calvin NOVAK, MD  aspirin  81 MG tablet Take 81 mg by mouth at bedtime.    [provider]  atorvastatin  (LIPITOR) 40 MG tablet Take 40 mg by mouth at bedtime.    [provider]  benzonatate  (TESSALON ) 200 MG capsule Take 1 capsule (200 mg total) by mouth 3 (three) times daily as needed for cough. 08/29/22   Jhonny Calvin NOVAK, MD  ciprofloxacin  (CIPRO ) 500 MG tablet Take 1 tablet (500 mg total) by mouth 2 (two) times daily for 11 days. 08/29/22 09/09/22  Jhonny Calvin NOVAK, MD  Coenzyme Q10 (COQ10) 100 MG CAPS Take 1 capsule by mouth daily.      [provider]  latanoprost  (XALATAN ) 0.005 % ophthalmic solution Place 1 drop into the right eye at bedtime. 03/04/22   [provider]  lisinopril  (ZESTRIL ) 20 MG tablet Take 1 tablet (20 mg total) by mouth daily. Patient taking differently: Take 20 mg by mouth at bedtime. 07/22/22   Claudene Victory ORN, MD  meclizine  (ANTIVERT ) 25 MG tablet Take 1 tablet (25 mg total) by mouth 2 (two) times daily as needed for dizziness. 08/29/22   Jhonny Calvin NOVAK, MD  metoprolol  succinate (TOPROL -XL) 25 MG 24 hr tablet TAKE 1 TABLET(25 MG) BY MOUTH DAILY Patient taking differently: Take 25 mg by mouth at bedtime. 08/18/22   Lelon Hamilton T, PA-C  Multiple Vitamin (MULTIVITAMIN PO) Take 1 tablet by mouth daily.    [provider]  nitroGLYCERIN  (NITROSTAT ) 0.4 MG SL tablet Place 0.4 mg under the tongue every 5 (five) minutes as needed for chest pain.    [provider]  Oxycodone  HCl 10 MG TABS Take 10-20 mg by mouth every 6 (six) hours as needed (moderate to severe pain).    [provider]   No Known Allergies  FAMILY HISTORY:  family history includes Alzheimer's disease in his maternal grandmother; Colon cancer in his maternal grandfather; Coronary artery disease  in his brother; Heart failure in his mother; Hypertension in his brother and father; Kidney disease in his father. SOCIAL HISTORY:  reports that he has been smoking cigarettes. He started smoking about 65 years ago. He has a 16.5 pack-year smoking history. He has never used smokeless tobacco. He reports that he does not currently use alcohol. He reports that he does not use drugs.  BP 120/80   Pulse 75   Temp 97.8 F (36.6 C)   Ht 5' 8.5 (1.74 m)   Wt 165 lb (74.8 kg)   SpO2 95%   BMI 24.72 kg/m    Review of Systems  Constitutional: Negative.   Respiratory:  Positive for cough.   Cardiovascular: Negative.   Neurological: Negative.     Physical Exam Constitutional:      Appearance: Normal appearance.  Cardiovascular:     Rate and Rhythm: Normal rate and regular rhythm.     Pulses: Normal pulses.     Heart sounds: Normal heart sounds.  Pulmonary:     Effort: Pulmonary effort is normal.     Breath sounds: Normal breath sounds. No wheezing.  Neurological:     Mental Status: He is alert.      ASSESSMENT AND PLAN SYNOPSIS 83 year old pleasant white male seen today for follow-up assessment for multiple medical issues including diagnosis of obstructive  sleep apnea diagnosed 10 years ago with history of coronary artery disease and myocardial infarction with cardiac stents with previous admission to the ICU for E. coli sepsis and metabolic encephalopathy with a long standing history of pulmonary embolism with ongoing tobacco abuse now with acute spinal fracture    Assessment of OSA Previous AHI 10 Continue CPAP as prescribed  Excellent compliance report Reviewed compliance report in detail with patient Patient definitely benefits the use of CPAP therapy as prescribed Using CPAP nightly and with naps Pressure setting is comfortable and is sleeping well. CPAP prescription 9 AHI reduced to 8  No evidence of acute heart failure at this time No respiratory distress No  fevers, chills, nausea, vomiting, diarrhea No evidence hemoptysis  Patient Instructions Continue to use CPAP every night, minimum of 4-6 hours a night.  Change equipment every 30 days or as directed by DME.  Wash your tubing with warm soap and water  daily, hang to dry. Wash humidifier portion weekly. Use bottled, distilled water  and change daily    Assessment of emphysema No maintenance inhalers at this time Patient doing well over all, does  NOT feel he needs maintainace therapy Albuterol  used 4 times in last 6 months Patient with a history of glaucoma therefore will avoid long-acting muscarinic antagonist   Assessment pulmonary embolism Patient no longer on anticoagulation therapy  History of coronary artery disease Follow-up with cardiology   Smoking Assessment and Cessation Counseling Upon further questioning, Patient smokes 1/2 ppd I have advised patient to quit/stop smoking as soon as possible due to high risk for multiple medical problems Patient is willing to quit smoking  I have advised patient that we can assist and have options of Nicotine replacement therapy. I also advised patient on behavioral therapy and can provide oral medication therapy in conjunction with the other therapies Follow up next Office visit  for assessment of smoking cessation Smoking cessation counseling advised for >4 minutes    MEDICATION ADJUSTMENTS/LABS AND TESTS ORDERED: Continue CPAP to 9 cm water  pressure Please stop smoking Avoid Allergens and Irritants   CURRENT MEDICATIONS REVIEWED AT LENGTH WITH PATIENT TODAY   Patient  satisfied with Plan of action and management. All questions answered   Follow up 6 months   I spent a total of 45 minutes dedicated to the care of this patient on the date of this encounter to include pre-visit review of records, face-to-face time with the patient discussing conditions above, post visit ordering of testing, clinical documentation with the  electronic health record, making appropriate referrals as documented, and communicating necessary information to the patient's healthcare team.    The Patient requires high complexity decision making for assessment and support, frequent evaluation and titration of therapies, application of advanced monitoring technologies and extensive interpretation of multiple databases.  Patient satisfied with Plan of action and management. All questions answered    Nickolas Alm Cellar, M.D.  [facility] Pulmonary & Critical Care Medicine  Medical Director [facility] [place]

## 2024-06-08 NOTE — Patient Instructions (Addendum)
 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  Continue CPAP as prescribed  Patient Instructions Continue to use CPAP every night, minimum of 4-6 hours a night.  Change equipment every 30 days or as directed by DME.  Wash your tubing with warm soap and water  daily, hang to dry. Wash humidifier portion weekly. Use bottled, distilled water  and change daily   Be aware of reduced alertness and do not drive or operate heavy machinery if experiencing this or drowsiness.  Exercise encouraged, as tolerated. Encouraged proper weight management.  Important to get eight or more hours of sleep  Limiting the use of the computer and television before bedtime.  Decrease naps during the day, so night time sleep will become enhanced.  Limit caffeine, and sleep deprivation.    Avoid Allergens and Irritants Avoid secondhand smoke Avoid SICK contacts Recommend  Masking  when appropriate Recommend Keep up-to-date with vaccinations      Smoking Assessment and Cessation Counseling Can use albuterol  as needed

## 2024-06-22 ENCOUNTER — Ambulatory Visit (INDEPENDENT_AMBULATORY_CARE_PROVIDER_SITE_OTHER): Admitting: Vascular Surgery

## 2024-06-22 ENCOUNTER — Encounter (INDEPENDENT_AMBULATORY_CARE_PROVIDER_SITE_OTHER): Payer: Self-pay | Admitting: Vascular Surgery

## 2024-06-22 VITALS — BP 129/79 | HR 79 | Resp 17 | Ht 68.0 in | Wt 166.8 lb

## 2024-06-22 DIAGNOSIS — R600 Localized edema: Secondary | ICD-10-CM

## 2024-06-22 DIAGNOSIS — I10 Essential (primary) hypertension: Secondary | ICD-10-CM | POA: Diagnosis not present

## 2024-06-27 ENCOUNTER — Encounter (INDEPENDENT_AMBULATORY_CARE_PROVIDER_SITE_OTHER): Payer: Self-pay | Admitting: Vascular Surgery

## 2024-06-27 NOTE — Progress Notes (Signed)
 Subjective:    Patient ID: Brian [name], male    DOB: [birth year], 83 y.o.   MRN: [id number] Chief Complaint  Patient presents with   Follow-up    6 months no studies    Brian [name] is an 83 year old male presents to clinic today in follow up with chief complaint of bilateral lower extremity swelling to his ankles and feet.  He was last seen on 12/22/2023 for bilateral lower extremity swelling.  At that time I recommended he start conservative therapy such as compression, elevation, rest and exercise.  Patient is adamant in stating that because he cares for his wife he is on his feet all day and cannot elevate his legs at all.  He started wearing compression socks which he feels are working excellent for his lower extremities from his ankles up but is not solving any swelling to his feet.  He feels like the swelling to his feet limits his ability to bend and use his feet even though he has right foot drop.  He does like the swelling to his feet and ankles and is looking to be reevaluated to see if there is anything else we can do today. He has a history of bilateral foot surgery's for orthopedic toe issues.       History of Present Illness            Results           Review of Systems  Constitutional: Negative.   Cardiovascular:  Positive for leg swelling.        bilateral lower extremity swelling, this includes bilateral ankles and feet.  All other systems reviewed and are negative.      Objective:   Physical Exam Vitals reviewed.  Constitutional:      Appearance: Normal appearance. He is normal weight.  HENT:     Head: Normocephalic and atraumatic.  Eyes:     Pupils: Pupils are equal, round, and reactive to light.  Cardiovascular:     Rate and Rhythm: Normal rate and regular rhythm.     Pulses: Normal pulses.     Heart sounds: Normal heart sounds.  Pulmonary:     Effort: Pulmonary effort is normal.     Breath sounds: Normal breath sounds.  Abdominal:      General: Abdomen is flat. Bowel sounds are normal.     Palpations: Abdomen is soft.  Musculoskeletal:        General: Swelling present.     Right lower leg: Edema present.     Left lower leg: Edema present.     Comments: +2 swelling to bilateral feet and ankles.  Skin:    General: Skin is warm and dry.     Capillary Refill: Capillary refill takes 2 to 3 seconds.  Neurological:     General: No focal deficit present.     Mental Status: He is alert and oriented to person, place, and time. Mental status is at baseline.  Psychiatric:        Mood and Affect: Mood normal.        Behavior: Behavior normal.        Thought Content: Thought content normal.        Judgment: Judgment normal.     Physical Exam          BP 129/79   Pulse 79   Resp 17   Ht 5' 8 (1.727 m)   Wt 166 lb 12.8 oz (75.7 kg)   BMI  25.36 kg/m   Past Medical History:  Diagnosis Date   AAA (abdominal aortic aneurysm)    Adenomatous polyp    Anxiety    Arthritis    Cancer (HCC) 2022   Carpal tunnel syndrome on left    Cataract    Cervical spine disease    Colitis    Coronary artery disease    S/p anterior MI tx with PCI in 1998  //  Myoview  6/22: EF 45, ant and septal scar; no ischemia; intermediate risk // Echocardiogram 7/22: EF 45-50, ant-sept and apical HK, Gr 1 DD, normal RVSF, mild MR   Deviated septum    Emphysema of lung (HCC) 2024   Glaucoma 2023   Hyperlipidemia    Hypertension    Renal Artery US  12/2022: no RAS bilaterally   Ischemic cardiomyopathy 05/19/2022   Echocardiogram 06/02/22: EF 45-50, global HK, Gr 1 DD, NL RVSF, NL PASP (RVSP 26.4), trivial MR, RAP 3   Low back pain    Myocardial infarction (HCC) 1998   Personal history of pulmonary embolism    Pulmonary embolism (HCC) 08/26/2022   Sleep apnea    CPAP   Wears dentures    full upper    Social History   Socioeconomic History   Marital status: Married    Spouse name: [name]   Number of children: 1   Years of education: Not  on file   Highest education level: Some college, no degree  Occupational History   Occupation: retired    Comment: Electronics engineer  Tobacco Use   Smoking status: Every Day    Current packs/day: 0.25    Average packs/day: 0.3 packs/day for 65.9 years (16.5 ttl pk-yrs)    Types: Cigarettes    Start date: 1960   Smokeless tobacco: Never   Tobacco comments:    Smokes 5-7 cigarettes a day- khj 05/11/2024        Started at 83 years old    Smoked 1PPD at his heaviest  Vaping Use   Vaping status: Never Used  Substance and Sexual Activity   Alcohol use: Not Currently    Comment: occasionally, none   Drug use: No   Sexual activity: Yes    Partners: Female  Other Topics Concern   Not on file  Social History Narrative   Not on file   Social Drivers of Health   Financial Resource Strain: Low Risk  (02/08/2024)   Received from [facility]   Overall Financial Resource Strain (CARDIA)    Difficulty of Paying Living Expenses: Not hard at all  Food Insecurity: No Food Insecurity (05/30/2024)   Hunger Vital Sign    Worried About Running Out of Food in the Last Year: Never true    Ran Out of Food in the Last Year: Never true  Transportation Needs: No Transportation Needs (05/30/2024)   PRAPARE - Administrator, Civil Service (Medical): No    Lack of Transportation (Non-Medical): No  Physical Activity: Insufficiently Active (05/30/2024)   Exercise Vital Sign    Days of Exercise per Week: 2 days    Minutes of Exercise per Session: 20 min  Stress: No Stress Concern Present (05/30/2024)   [employer] of Occupational Health - Occupational Stress Questionnaire    Feeling of Stress: Only a little  Social Connections: Moderately Integrated (05/30/2024)   Social Connection and Isolation Panel    Frequency of Communication with Friends and Family: More than three times a week  Frequency of Social Gatherings with Friends and Family:  Once a week    Attends Religious Services: More than 4 times per year    Active Member of [employer] or Organizations: No    Attends Banker Meetings: Never    Marital Status: Married  Catering Manager Violence: Not At Risk (05/30/2024)   Humiliation, Afraid, Rape, and Kick questionnaire    Fear of Current or Ex-Partner: No    Emotionally Abused: No    Physically Abused: No    Sexually Abused: No    Past Surgical History:  Procedure Laterality Date   APPENDECTOMY     AQUEOUS SHUNT Right 10/26/2022   Procedure: AHMED TUBE SHUNT WITH TUTOPLAST RIGHT;  Surgeon: Myrna Adine Anes, MD;  Location: [facility];  Service: Ophthalmology;  Laterality: Right;   bunions     CATARACT EXTRACTION     COLONOSCOPY WITH PROPOFOL  N/A 02/19/2015   Procedure: COLONOSCOPY WITH PROPOFOL ;  Surgeon: Rogelia Copping, MD;  Location: [facility];  Service: Endoscopy;  Laterality: N/A;   COLONOSCOPY WITH PROPOFOL  N/A 06/05/2019   Procedure: COLONOSCOPY WITH BIOPSY;  Surgeon: Copping Rogelia, MD;  Location[facility];  Service: Endoscopy;  Laterality: N/A;  sleep apnea   ERCP N/A 08/25/2022   Procedure: ENDOSCOPIC RETROGRADE CHOLANGIOPANCREATOGRAPHY (ERCP);  Surgeon: Copping Rogelia, MD;  Location: [facility];  Service: Endoscopy;  Laterality: N/A;   EYE SURGERY     HEMORRHOID BANDING     HERNIA REPAIR     x3   IR KYPHO THORACIC WITH BONE BIOPSY  11/25/2023   IR RADIOLOGIST EVAL & MGMT  11/11/2023   OLECRANON BURSECTOMY Right    POLYPECTOMY N/A 06/05/2019   Procedure: POLYPECTOMY;  Surgeon: Copping Rogelia, MD;  Location: [facility];  Service: Endoscopy;  Laterality: N/A;    Family History  Problem Relation Age of Onset   Heart failure Mother    Kidney disease Father    Hypertension Father    Coronary artery disease Brother    Hypertension Brother    Alzheimer's disease Maternal Grandmother    Colon cancer Maternal Grandfather     No Known Allergies     Latest Ref  Rng & Units 11/10/2023   12:00 AM 02/01/2023    3:20 PM 09/09/2022   12:00 AM  CBC  WBC 3.8 - 10.8 Thousand/uL 9.2  10.0  8.7      Hemoglobin 13.2 - 17.1 g/dL 84.5  85.0  86.5      Hematocrit 38.5 - 50.0 % 46.1  43.5  40      Platelets 140 - 400 Thousand/uL 249  253  446         This result is from an external source.      CMP     Component Value Date/Time   NA 140 01/18/2024 1555   NA 138 02/26/2014 1727   K 5.4 (H) 01/18/2024 1555   K 4.7 02/26/2014 1727   CL 100 01/18/2024 1555   CL 106 02/26/2014 1727   CO2 22 01/18/2024 1555   CO2 26 02/26/2014 1727   GLUCOSE 93 01/18/2024 1555   GLUCOSE 92 11/10/2023 0000   GLUCOSE 83 02/26/2014 1727   BUN 17 01/18/2024 1555   BUN 12 02/26/2014 1727   CREATININE 1.00 01/18/2024 1555   CREATININE 0.96 11/10/2023 0000   CALCIUM  10.1 01/18/2024 1555   CALCIUM  8.9 02/26/2014 1727   PROT 6.7 12/06/2023 1606   PROT 6.9 02/26/2014 1727   ALBUMIN 4.2 12/06/2023  1606   ALBUMIN 3.5 02/26/2014 1727   AST 18 12/06/2023 1606   AST 23 02/26/2014 1727   ALT 16 12/06/2023 1606   ALT 26 02/26/2014 1727   ALKPHOS 124 (H) 12/06/2023 1606   ALKPHOS 91 02/26/2014 1727   BILITOT 0.3 12/06/2023 1606   BILITOT 0.6 02/26/2014 1727   EGFR 75 01/18/2024 1555   GFRNONAA >60 06/08/2023 1505   GFRNONAA >60 02/26/2014 1727     No results found.     Assessment & Plan:   1. Lower extremity edema (Primary) Patient returns to clinic today for reevaluation of bilateral lower extremity swelling most notably in his feet and ankles.  I had a long conversation with Mr. Cavanagh today emphasizing that he has multiple contributing issues to his bilateral feet and ankle swelling.  First that he had multiple surgeries to both feet many years ago which I believe are now just coming into play contributing to some of the swelling.  Also believe that he has had chronic hypertension in which he is on amlodipine  which contributes to swelling in people's hands and feet.  I  also believe at his age of 61 now this is contributing to a problem he has had for many years as he gets older.  I also believe his lifestyle is contributing very much to his feet and ankle swelling as he has to take care of his wife at home.  For all of these contributing factors I recommend that he try a tighter compression to his feet with leather shoes that tie that can help compress his feet while he is on them all day.  I also recommend that he has to put his feet up during the day multiple times a day in order to try to control some of the swelling that he feels is severe at the end of the day.  I asked that he follow-up with his PCP to see if there is another blood pressure medication he can be on but does not contribute to people's hands and feet swelling.  He states that he has been on multiple medications over the years nothing works but this medication.  I informed him that he would then have to deal with the swelling to his feet and his ankles if this cannot be changed because this is a complication of taking this medication.  As he gets older he is going to change physically which means he may have to slow down or get some help to take care of his wife at home.  He has to spend more time off of his feet elevating his feet in order to be able to try to reduce some of the swelling.  Changing his lifestyle and what he does on a daily basis is going to impact how his feet and ankles swell.  I also discussed in detail with him today the prior surgeries he has had to his feet with foot drop to his right foot and how this complicates things and can be a contributing factor to his swelling in his feet.  With his age this may be something he just chronically lives with and has to control through rest elevation compression and possibly minimizing the effects of medications he is taking for his other chronic conditions.  Unfortunately I do not believe Mr. Joyce was happy hearing that conventional  therapy is going to be part of his daily regime but at this time I have nothing else to offer to help reduce the  swelling in his feet and his ankles.  Mr. Joyce can follow-up with us  as needed if things worsen, otherwise I have no other interventions to offer at this time.  2. Essential hypertension Continue antihypertensive medications as already ordered, these medications have been reviewed and there are no changes at this time.  However I asked him to follow-up with his PCP and cardiologist regarding the best blood pressure medication he can be on for excellent control as well as minimal side effects such as swelling to his hands and his feet.  I also had a discussion with him regarding having excellent control of his blood pressure as elevated blood pressure can contribute to bilateral lower extremity swelling.               Current Outpatient Medications on File Prior to Visit  Medication Sig Dispense Refill   albuterol  (VENTOLIN  HFA) 108 (90 Base) MCG/ACT inhaler Inhale 2 puffs into the lungs every 6 (six) hours as needed for wheezing or shortness of breath. 8 g 2   alendronate  (FOSAMAX ) 70 MG tablet Take 1 tablet (70 mg total) by mouth every 7 (seven) days. Take with a full glass of water  on an empty stomach. 4 tablet 11   amLODipine  (NORVASC ) 10 MG tablet TAKE 1 TABLET(10 MG) BY MOUTH DAILY 90 tablet 3   aspirin  81 MG tablet Take 81 mg by mouth at bedtime.     atorvastatin  (LIPITOR) 40 MG tablet Take 1 tablet (40 mg total) by mouth at bedtime. 100 tablet 3   Cholecalciferol (VITAMIN D3) 50 MCG (2000 UT) CAPS Take by mouth daily.     Coenzyme Q10 (COQ10) 100 MG CAPS Take 1 capsule by mouth daily.       dorzolamide (TRUSOPT) 2 % ophthalmic solution Place 1 drop into the right eye 2 (two) times daily.     latanoprost  (XALATAN ) 0.005 % ophthalmic solution Place 1 drop into the right eye at bedtime.     Multiple Vitamin (MULTIVITAMIN PO) Take 1 tablet by mouth daily.     nitroGLYCERIN   (NITROSTAT ) 0.4 MG SL tablet PLACE 1 TABLET UNDER THE TONGUE EVERY 5 MINUTES AS NEEDED FOR CHEST PAIN. MAX 3 TABLETS 25 tablet 1   Oxycodone  HCl 10 MG TABS Take 1-2 tablets (10-20 mg total) by mouth every 6 (six) hours as needed (moderate to severe pain). 120 tablet 0   saccharomyces boulardii (FLORASTOR) 250 MG capsule Take 250 mg by mouth 2 (two) times daily.     budesonide -formoterol  (SYMBICORT ) 80-4.5 MCG/ACT inhaler Inhale 2 puffs into the lungs in the morning and at bedtime. (Patient not taking: Reported on 06/08/2024) 1 each 12   furosemide  (LASIX ) 20 MG tablet Take 1 tablet (20 mg) by mouth once daily as needed for swelling (Patient not taking: Reported on 06/08/2024) 30 tablet 3   hydrALAZINE  (APRESOLINE ) 25 MG tablet Take 1 tablet (25 mg total) by mouth 3 (three) times daily. Take as needed for blood pressure greater then 140. (Patient not taking: Reported on 06/08/2024) 270 tablet 3   meclizine  (ANTIVERT ) 25 MG tablet Take 1 tablet (25 mg total) by mouth 2 (two) times daily as needed for dizziness. (Patient not taking: Reported on 06/08/2024) 30 tablet 0   Current Facility-Administered Medications on File Prior to Visit  Medication Dose Route Frequency Provider Last Rate Last Admin   albuterol  (PROVENTIL ) (2.5 MG/3ML) 0.083% nebulizer solution 2.5 mg  2.5 mg Nebulization Once Stoneking, Hal, MD        There  are no Patient Instructions on file for this visit. No follow-ups on file.   Gwendlyn JONELLE Shank, NP

## 2024-06-28 ENCOUNTER — Encounter: Payer: Self-pay | Admitting: Family Medicine

## 2024-06-29 ENCOUNTER — Other Ambulatory Visit: Payer: Self-pay | Admitting: Family Medicine

## 2024-06-29 ENCOUNTER — Other Ambulatory Visit: Payer: Self-pay

## 2024-06-29 DIAGNOSIS — G894 Chronic pain syndrome: Secondary | ICD-10-CM

## 2024-06-29 MED ORDER — OXYCODONE HCL 10 MG PO TABS: 10.0000 mg | ORAL_TABLET | Freq: Four times a day (QID) | ORAL | 0 refills | Status: DC | PRN

## 2024-06-29 NOTE — Telephone Encounter (Unsigned)
 Copied from CRM #[id number]. Topic: Clinical - Medication Refill >> Jun 29, 2024 10:18 AM Tobias L wrote: Medication: Oxycodone  HCl 10 MG TABS Patient attempted to request refill via mychart but received message Dr. Myrla is out of the office until next week. Requesting covering provider to confirm refill as he is needing it by this Saturday.   Has the patient contacted their pharmacy? No  This is the patient's preferred pharmacy:  [id number] [place] - [address] AT [facility] OF [address] [facility] [place] [address] Phone: [phone] Fax: [phone]  Is this the correct pharmacy for this prescription? Yes  Has the prescription been filled recently? No  Is the patient out of the medication? No  Has the patient been seen for an appointment in the last year OR does the patient have an upcoming appointment? Yes  Can we respond through MyChart? Yes  Agent: Please be advised that Rx refills may take up to 3 business days. We ask that you follow-up with your pharmacy.

## 2024-06-30 NOTE — Telephone Encounter (Signed)
 Requested medication (s) are due for refill today: no  Requested medication (s) are on the active medication list: yes  Last refill:  06/29/24  Future visit scheduled: yes  Notes to clinic:  Unable to refill per protocol, cannot delegate.      Requested Prescriptions  Pending Prescriptions Disp Refills   Oxycodone  HCl 10 MG TABS 120 tablet 0    Sig: Take 1-2 tablets (10-20 mg total) by mouth every 6 (six) hours as needed (moderate to severe pain).     Not Delegated - Analgesics:  Opioid Agonists Failed - 06/30/2024  4:13 PM      Failed - This refill cannot be delegated      Failed - Urine Drug Screen completed in last 360 days      Passed - Valid encounter within last 3 months    Recent Outpatient Visits           5 months ago Chronic pain syndrome   [place] [facility] [place], Jon HERO, MD   6 months ago Prediabetes   [facility] [place], Jon HERO, MD   9 months ago Other cough   [place] [facility] [place], [place], PA-C   9 months ago Acute on chronic low back pain   [place] [facility] [place], [place], PA-C   9 months ago Primary hypertension   [place] [facility] [place], Jon HERO, MD       Future Appointments             In 2 months Stoioff, Glendia BROCKS, MD [facility] [place]   In 3 months Hester Alm BROCKS, MD [facility]

## 2024-07-03 ENCOUNTER — Encounter: Admitting: Family Medicine

## 2024-08-01 ENCOUNTER — Encounter: Admitting: Family Medicine

## 2024-08-01 ENCOUNTER — Ambulatory Visit: Admitting: Orthopedic Surgery

## 2024-08-02 ENCOUNTER — Other Ambulatory Visit: Payer: Self-pay | Admitting: Family Medicine

## 2024-08-02 DIAGNOSIS — G894 Chronic pain syndrome: Secondary | ICD-10-CM

## 2024-08-02 NOTE — Telephone Encounter (Signed)
 Appears to have monthly prescription re-ordered.  See previously expired months.

## 2024-08-02 NOTE — Telephone Encounter (Signed)
 Copied from CRM [id number]. Topic: Clinical - Medication Refill >> Aug 02, 2024 12:52 PM Brittany M wrote: Medication: Oxycodone  HCl 10 MG TABS  Has the patient contacted their pharmacy? Yes (Agent: If no, request that the patient contact the pharmacy for the refill. If patient does not wish to contact the pharmacy document the reason why and proceed with request.) (Agent: If yes, when and what did the pharmacy advise?)  This is the patient's preferred pharmacy:  [id number] [place] - [address] AT [facility] [address] [place] [address] Phone: [phone] Fax: [phone]   Is this the correct pharmacy for this prescription? Yes If no, delete pharmacy and type the correct one.   Has the prescription been filled recently? Yes  Is the patient out of the medication? Yes  Has the patient been seen for an appointment in the last year OR does the patient have an upcoming appointment? Yes  Can we respond through MyChart? Yes  Agent: Please be advised that Rx refills may take up to 3 business days. We ask that you follow-up with your pharmacy.

## 2024-08-03 NOTE — Telephone Encounter (Signed)
 Requested medications are due for refill today.  yes  Requested medications are on the active medications list.  yes  Last refill. 07/02/2024 #120 0 rf  Future visit scheduled.   yes  Notes to clinic.  Refill not delegated.    Requested Prescriptions  Pending Prescriptions Disp Refills   Oxycodone  HCl 10 MG TABS 120 tablet 0    Sig: Take 1-2 tablets (10-20 mg total) by mouth every 6 (six) hours as needed (moderate to severe pain).     Not Delegated - Analgesics:  Opioid Agonists Failed - 08/03/2024  2:12 PM      Failed - This refill cannot be delegated      Failed - Urine Drug Screen completed in last 360 days      Passed - Valid encounter within last 3 months    Recent Outpatient Visits           6 months ago Chronic pain syndrome   [facility] [place], Jon HERO, MD   8 months ago Prediabetes   [facility] [place], Jon HERO, MD   10 months ago Other cough   [facility] [place], [place], PA-C   10 months ago Acute on chronic low back pain   [facility] [place], [place], [place]   11 months ago Primary hypertension   [place] [facility] [place], Jon HERO, MD       Future Appointments             In 3 weeks Stoioff, Glendia BROCKS, MD [facility] [place]   In 2 months Hester Alm BROCKS, MD [facility]

## 2024-08-04 ENCOUNTER — Ambulatory Visit: Payer: Self-pay

## 2024-08-04 NOTE — Telephone Encounter (Signed)
 FYI Only or Action Required?: Action required by provider: medication refill request.  Patient was last seen in primary care on 01/18/2024 by [name], MD.  Called Nurse Triage reporting Medication Refill.  Symptoms began today.  Interventions attempted: Nothing.  Symptoms are: gradually worsening.  Triage Disposition: Call PCP Now  Patient/caregiver understands and will follow disposition?: Yes  Reason for Disposition  [1] Prescription refill request for ESSENTIAL medicine (i.e., likelihood of harm to patient if not taken) AND [2] triager unable to refill per department policy  Answer Assessment - Initial Assessment Questions Patient has been calling since Wednesday regarding refill on Oxycodone  10mg  with no response. Patient ran out of medication this morning and is currently in 7/10 pain and increasing. Requesting medication be sent to pharmacy.   1. DRUG NAME: What medicine do you need to have refilled?     Oxycodone  10mg   2. REFILLS REMAINING: How many refills are remaining? Notes: The label on the medicine or pill bottle will show how many refills are remaining. If there are no refills remaining, then a renewal may be needed.     Unknown  3. EXPIRATION DATE: What is the expiration date? Note: The label states when the prescription will expire, and thus can no longer be refilled.)     Unknown  4. PRESCRIBER: Who prescribed it? Note: The prescribing doctor or group is responsible for refill approvals..     Angela Bacigalupo  5. PHARMACY: Have you contacted your pharmacy (drugstore)? Note: Some pharmacies will contact the doctor (or NP/PA).      [id number] - [place], [place]  6. SYMPTOMS: Do you have any symptoms?     Yes, 7/10 back pain  7. PREGNANCY: Is there any chance that you are pregnant? When was your last menstrual period?     NA  Protocols used: Medication Refill and Renewal Call-A-AH

## 2024-08-04 NOTE — Telephone Encounter (Signed)
 Copied from CRM [id number]. Topic: Clinical - Medication Refill >> Aug 02, 2024 12:52 PM Brittany M wrote: Medication: Oxycodone  HCl 10 MG TABS  Has the patient contacted their pharmacy? Yes (Agent: If no, request that the patient contact the pharmacy for the refill. If patient does not wish to contact the pharmacy document the reason why and proceed with request.) (Agent: If yes, when and what did the pharmacy advise?)  This is the patient's preferred pharmacy:  [id number] [place] - [address] AT [facility] OF [address] [place] [address] Phone: [phone] Fax: [phone]   Is this the correct pharmacy for this prescription? Yes If no, delete pharmacy and type the correct one.   Has the prescription been filled recently? Yes  Is the patient out of the medication? Yes  Has the patient been seen for an appointment in the last year OR does the patient have an upcoming appointment? Yes  Can we respond through MyChart? Yes  Agent: Please be advised that Rx refills may take up to 3 business days. We ask that you follow-up with your pharmacy. >> Aug 04, 2024  1:15 PM Zebedee SAUNDERS wrote: Pt called stated he is out of medication and pharmacy has not received medication refill request. Did mention to pt it can take up to 3 business days.

## 2024-08-04 NOTE — Telephone Encounter (Signed)
 Patient is calling in regarding prescription refill. States

## 2024-08-04 NOTE — Telephone Encounter (Signed)
 Copied from CRM [id number]. Topic: Clinical - Prescription Issue >> Aug 04, 2024 10:02 AM Everette C wrote: Reason for CRM: The patient has called to follow up on a previously submitted refill request for their prescription of Oxycodone  HCl 10 MG TABS [[id number]] . The patient would like to be notified of when they can anticipate picking up the prescription from their pharmacy. Please contact further when possible

## 2024-08-07 ENCOUNTER — Encounter: Payer: Self-pay | Admitting: Family Medicine

## 2024-08-07 ENCOUNTER — Ambulatory Visit: Admitting: Family Medicine

## 2024-08-07 VITALS — BP 132/83 | HR 66 | Resp 14 | Ht 66.0 in | Wt 164.1 lb

## 2024-08-07 DIAGNOSIS — G894 Chronic pain syndrome: Secondary | ICD-10-CM

## 2024-08-07 DIAGNOSIS — I10 Essential (primary) hypertension: Secondary | ICD-10-CM

## 2024-08-07 DIAGNOSIS — Z0000 Encounter for general adult medical examination without abnormal findings: Secondary | ICD-10-CM

## 2024-08-07 DIAGNOSIS — J432 Centrilobular emphysema: Secondary | ICD-10-CM | POA: Insufficient documentation

## 2024-08-07 DIAGNOSIS — R7303 Prediabetes: Secondary | ICD-10-CM | POA: Diagnosis not present

## 2024-08-07 DIAGNOSIS — Z95811 Presence of heart assist device: Secondary | ICD-10-CM | POA: Insufficient documentation

## 2024-08-07 DIAGNOSIS — E7800 Pure hypercholesterolemia, unspecified: Secondary | ICD-10-CM

## 2024-08-07 DIAGNOSIS — M810 Age-related osteoporosis without current pathological fracture: Secondary | ICD-10-CM | POA: Insufficient documentation

## 2024-08-07 DIAGNOSIS — H4311 Vitreous hemorrhage, right eye: Secondary | ICD-10-CM | POA: Diagnosis not present

## 2024-08-07 MED ORDER — OXYCODONE HCL 10 MG PO TABS: 10.0000 mg | ORAL_TABLET | Freq: Four times a day (QID) | ORAL | 0 refills | Status: AC | PRN

## 2024-08-07 MED ORDER — OXYCODONE HCL 10 MG PO TABS: 10.0000 mg | ORAL_TABLET | Freq: Four times a day (QID) | ORAL | 0 refills | Status: DC | PRN

## 2024-08-07 NOTE — Assessment & Plan Note (Signed)
 Blood pressure slightly elevated during visit, likely due to recent lack of pain medication. - Continue current antihypertensive regimen.

## 2024-08-07 NOTE — Assessment & Plan Note (Signed)
F/b Optho

## 2024-08-07 NOTE — Assessment & Plan Note (Signed)
 Recent issues with medication refills due to communication breakdown. Pain management includes oxycodone , with recent reduction in dosage following nerve block and radiofrequency ablation procedures. - Sent oxycodone  refill to pharmacy. - Scheduled follow-up in three months to reassess pain management.

## 2024-08-07 NOTE — Assessment & Plan Note (Signed)
 Cholesterol levels to be reassessed with upcoming lab work. - Ordered cholesterol panel as part of comprehensive lab work.

## 2024-08-07 NOTE — Assessment & Plan Note (Signed)
 Ongoing management with Fosamax . Previous compression fracture treated with vertebroplasty. Bone density scan last performed in April 2025. - Continue Fosamax  therapy. - Will schedule bone density scan for April 2027.

## 2024-08-07 NOTE — Patient Instructions (Signed)
Consider asking at the pharmacy about the tetanus shot (TDAP) ?

## 2024-08-07 NOTE — Progress Notes (Signed)
 "    Complete physical exam   Patient: Brian [name]   DOB: 01/20/1941   84 y.o. Male  MRN: [id number] Visit Date: 08/07/2024  Today's healthcare provider: Jon Eva, MD   Chief Complaint  Patient presents with   Annual Exam    Last Exam Physical: 06/07/2023 Diet: no diet Exercise: Not much exercise Feeling: Good Sleeping: Well, 6-7 hours nightly No concerns   Subjective    Brian [name] is a 84 y.o. male who presents today for a complete physical exam.   Discussed the use of AI scribe software for clinical note transcription with the patient, who gave verbal consent to proceed.  History of Present Illness   Brian [name] is an 84 year old male who presents for an annual physical exam and wellness screening.  He is concerned about decreased hair growth, noting he now needs a haircut every five weeks instead of every four and is considering starting biotin based on his brothers experience.  He recently had difficulty obtaining oxycodone  due to office communication problems and ran out last Friday, resulting in a weekend of poorly controlled chronic pain with only Tylenol . Nerve blocks and RFAs have allowed him to reduce oxycodone  use by about 25% at times.  He continues to smoke and finds quitting difficult despite prior discussions about risks.  He has colitis managed with Florastor, which controls his symptoms, and has not needed recent gastroenterology care.  He has chronic positional hearing difficulty, attributed to eustachian tube dysfunction diagnosed about ten years ago, and has declined further invasive treatment.         08/07/2024    2:33 PM 09/14/2023    2:42 PM  GAD 7 : Generalized Anxiety Score  Nervous, Anxious, on Edge 0  3   Control/stop worrying 0  0   Worry too much - different things 0  0   Trouble relaxing 0  0   Restless 0  0   Easily annoyed or irritable 0  0   Afraid - awful might happen 0  0   Total GAD 7 Score 0 3   Anxiety Difficulty Not difficult at all      Data saved with a previous flowsheet row definition     Last depression screening scores    08/07/2024    2:33 PM 05/30/2024    3:09 PM 09/14/2023    2:42 PM  PHQ 2/9 Scores  PHQ - 2 Score 0 2 0  PHQ- 9 Score 0 2 0      Data saved with a previous flowsheet row definition   Last fall risk screening    08/07/2024    2:31 PM  Fall Risk   Falls in the past year? 0  Number falls in past yr: 0  Injury with Fall? 0  Risk for fall due to : No Fall Risks  Follow up Falls evaluation completed        Medications: Show/hide medication list[1]  Review of Systems    Objective    BP 132/83   Pulse 66   Resp 14   Ht 5' 6 (1.676 m)   Wt 164 lb 1.6 oz (74.4 kg)   SpO2 99%   BMI 26.49 kg/m    Physical Exam Vitals reviewed.  Constitutional:      General: He is not in acute distress.    Appearance: Normal appearance. He is well-developed. He is not diaphoretic.  HENT:     Head: Normocephalic  and atraumatic.     Right Ear: Tympanic membrane, ear canal and external ear normal.     Left Ear: Tympanic membrane, ear canal and external ear normal.     Nose: Nose normal.     Mouth/Throat:     Mouth: Mucous membranes are moist.     Pharynx: Oropharynx is clear. No oropharyngeal exudate.  Eyes:     General: No scleral icterus.    Conjunctiva/sclera: Conjunctivae normal.     Pupils: Pupils are equal, round, and reactive to light.  Neck:     Thyroid : No thyromegaly.  Cardiovascular:     Rate and Rhythm: Normal rate and regular rhythm.     Heart sounds: Normal heart sounds. No murmur heard. Pulmonary:     Effort: Pulmonary effort is normal. No respiratory distress.     Breath sounds: Normal breath sounds. No wheezing or rales.  Abdominal:     General: There is no distension.     Palpations: Abdomen is soft.     Tenderness: There is no abdominal tenderness.  Musculoskeletal:        General: No deformity.     Cervical back:  Neck supple.     Right lower leg: No edema.     Left lower leg: No edema.  Lymphadenopathy:     Cervical: No cervical adenopathy.  Skin:    General: Skin is warm and dry.     Findings: No rash.  Neurological:     Mental Status: He is alert and oriented to person, place, and time. Mental status is at baseline.     Gait: Gait normal.  Psychiatric:        Mood and Affect: Mood normal.        Behavior: Behavior normal.        Thought Content: Thought content normal.      No results found for any visits on 08/07/24.  Assessment & Plan    Routine Health Maintenance and Physical Exam  Exercise Activities and Dietary recommendations  Goals      DIET - EAT MORE FRUITS AND VEGETABLES     DIET - INCREASE WATER  INTAKE        Immunization History  Administered Date(s) Administered    sv, Bivalent, Protein Subunit Rsvpref,pf (Abrysvo) 03/06/2022   Covid-19 Iv Non-us  Vaccine (Bibp, Sinopharm) 04/07/2024   Fluad  Quad(high Dose 65+) 03/22/2019, 04/25/2021, 05/10/2022   Fluad  Trivalent(High Dose 65+) 04/20/2023, 04/27/2024   INFLUENZA, HIGH DOSE SEASONAL PF 04/22/2016, 04/14/2017, 03/30/2018   Influenza Split 04/19/2012, 05/03/2013   Influenza-Unspecified 05/11/2017, 04/19/2018, 05/27/2020, 05/27/2020, 05/10/2022   PFIZER Comirnaty(Gray Top)Covid-19 Tri-Sucrose Vaccine 12/25/2020, 05/10/2022   PFIZER(Purple Top)SARS-COV-2 Vaccination 10/22/2019, 11/19/2019, 08/07/2020, 12/25/2020   Pfizer Covid-19 Vaccine Bivalent Booster 58yrs & up 04/25/2021, 03/06/2022   Pfizer(Comirnaty)Fall Seasonal Vaccine 12 years and older 05/10/2022, 05/13/2023, 04/07/2024   Pneumococcal Conjugate-13 05/18/2013   Pneumococcal Polysaccharide-23 10/23/2005   Td (Adult) 12/19/2003   Zoster, Live 11/09/2016, 05/12/2017    Health Maintenance  Topic Date Due   DTaP/Tdap/Td (1 - Tdap) 12/20/2003   COVID-19 Vaccine (10 - 2025-26 season) 06/02/2024   Zoster Vaccines- Shingrix (1 of 2) 07/21/2032 (Originally  07/01/1960)   Medicare Annual Wellness (AWV)  05/30/2025   Pneumococcal Vaccine: 50+ Years  Completed   Influenza Vaccine  Completed   Meningococcal B Vaccine  Aged Out   Colonoscopy  Discontinued    Discussed health benefits of physical activity, and encouraged him to engage in regular exercise appropriate for his age and condition.  Problem List Items Addressed This Visit       Cardiovascular and Mediastinum   Hypertension   Blood pressure slightly elevated during visit, likely due to recent lack of pain medication. - Continue current antihypertensive regimen.      Relevant Orders   Comprehensive metabolic panel with GFR     Respiratory   Centrilobular emphysema (HCC)   Chronic and stable No changes        Musculoskeletal and Integument   Age-related osteoporosis without current pathological fracture   Ongoing management with Fosamax . Previous compression fracture treated with vertebroplasty. Bone density scan last performed in April 2025. - Continue Fosamax  therapy. - Will schedule bone density scan for April 2027.         Other   Hyperlipidemia   Cholesterol levels to be reassessed with upcoming lab work. - Ordered cholesterol panel as part of comprehensive lab work.      Relevant Orders   Comprehensive metabolic panel with GFR   Lipid panel   Chronic pain syndrome   Recent issues with medication refills due to communication breakdown. Pain management includes oxycodone , with recent reduction in dosage following nerve block and radiofrequency ablation procedures. - Sent oxycodone  refill to pharmacy. - Scheduled follow-up in three months to reassess pain management.      Relevant Medications   Oxycodone  HCl 10 MG TABS   Other Relevant Orders   Pain Mgt Scrn (14 Drugs), Ur   Prediabetes   Dietary modifications include reduced banana intake. - Ordered comprehensive lab panel including A1c, cholesterol, kidney and liver function tests.      Relevant  Orders   Hemoglobin A1c   Presence of heart assist device [facility])   F/b Cardiology      Vitreous hemorrhage, right eye (HCC)   F/b Optho      Other Visit Diagnoses       Encounter for annual physical exam    -  Primary          Return in about 3 months (around 11/05/2024) for chronic disease f/u.     Jon Eva, MD  [facility] [phone] (phone) [phone] (fax)  [facility]     [1]  Outpatient Medications Prior to Visit  Medication Sig   albuterol  (VENTOLIN  HFA) 108 (90 Base) MCG/ACT inhaler Inhale 2 puffs into the lungs every 6 (six) hours as needed for wheezing or shortness of breath.   alendronate  (FOSAMAX ) 70 MG tablet Take 1 tablet (70 mg total) by mouth every 7 (seven) days. Take with a full glass of water  on an empty stomach.   amLODipine  (NORVASC ) 10 MG tablet TAKE 1 TABLET(10 MG) BY MOUTH DAILY   aspirin  81 MG tablet Take 81 mg by mouth at bedtime.   atorvastatin  (LIPITOR) 40 MG tablet Take 1 tablet (40 mg total) by mouth at bedtime.   Cholecalciferol (VITAMIN D3) 50 MCG (2000 UT) CAPS Take by mouth daily.   Coenzyme Q10 (COQ10) 100 MG CAPS Take 1 capsule by mouth daily.     dorzolamide (TRUSOPT) 2 % ophthalmic solution Place 1 drop into the right eye 2 (two) times daily.   latanoprost  (XALATAN ) 0.005 % ophthalmic solution Place 1 drop into the right eye at bedtime.   Multiple Vitamin (MULTIVITAMIN PO) Take 1 tablet by mouth daily.   nitroGLYCERIN  (NITROSTAT ) 0.4 MG SL tablet PLACE 1 TABLET UNDER THE TONGUE EVERY 5 MINUTES AS NEEDED FOR CHEST PAIN. MAX 3 TABLETS   saccharomyces boulardii (FLORASTOR)  250 MG capsule Take 250 mg by mouth 2 (two) times daily.   [DISCONTINUED] Oxycodone  HCl 10 MG TABS Take 1-2 tablets (10-20 mg total) by mouth every 6 (six) hours as needed (moderate to severe pain).   [DISCONTINUED] budesonide -formoterol  (SYMBICORT ) 80-4.5 MCG/ACT inhaler Inhale 2 puffs into the lungs in the  morning and at bedtime. (Patient not taking: Reported on 06/08/2024)   [DISCONTINUED] furosemide  (LASIX ) 20 MG tablet Take 1 tablet (20 mg) by mouth once daily as needed for swelling (Patient not taking: Reported on 08/07/2024)   [DISCONTINUED] hydrALAZINE  (APRESOLINE ) 25 MG tablet Take 1 tablet (25 mg total) by mouth 3 (three) times daily. Take as needed for blood pressure greater then 140. (Patient not taking: Reported on 08/07/2024)   [DISCONTINUED] meclizine  (ANTIVERT ) 25 MG tablet Take 1 tablet (25 mg total) by mouth 2 (two) times daily as needed for dizziness. (Patient not taking: Reported on 08/07/2024)   Facility-Administered Medications Prior to Visit  Medication Dose Route Frequency Provider   albuterol  (PROVENTIL ) (2.5 MG/3ML) 0.083% nebulizer solution 2.5 mg  2.5 mg Nebulization Once Roseann Coad, MD   "

## 2024-08-07 NOTE — Assessment & Plan Note (Signed)
F/b Cardiology 

## 2024-08-07 NOTE — Assessment & Plan Note (Signed)
 Chronic and stable No changes

## 2024-08-07 NOTE — Telephone Encounter (Signed)
 Rx sent.

## 2024-08-07 NOTE — Assessment & Plan Note (Signed)
 Dietary modifications include reduced banana intake. - Ordered comprehensive lab panel including A1c, cholesterol, kidney and liver function tests.

## 2024-08-08 ENCOUNTER — Ambulatory Visit: Payer: Self-pay | Admitting: Family Medicine

## 2024-08-08 LAB — COMPREHENSIVE METABOLIC PANEL WITH GFR
ALT: 21 IU/L (ref 0–44)
AST: 29 IU/L (ref 0–40)
Albumin: 4.5 g/dL (ref 3.7–4.7)
Alkaline Phosphatase: 104 IU/L (ref 48–129)
BUN/Creatinine Ratio: 17 (ref 10–24)
BUN: 16 mg/dL (ref 8–27)
Bilirubin Total: 0.5 mg/dL (ref 0.0–1.2)
CO2: 19 mmol/L — ABNORMAL LOW (ref 20–29)
Calcium: 9.5 mg/dL (ref 8.6–10.2)
Chloride: 103 mmol/L (ref 96–106)
Creatinine, Ser: 0.92 mg/dL (ref 0.76–1.27)
Globulin, Total: 2.9 g/dL (ref 1.5–4.5)
Glucose: 74 mg/dL (ref 70–99)
Potassium: 4.1 mmol/L (ref 3.5–5.2)
Sodium: 138 mmol/L (ref 134–144)
Total Protein: 7.4 g/dL (ref 6.0–8.5)
eGFR: 82 mL/min/1.73

## 2024-08-08 LAB — LIPID PANEL
Chol/HDL Ratio: 2.1 ratio (ref 0.0–5.0)
Cholesterol, Total: 105 mg/dL (ref 100–199)
HDL: 49 mg/dL
LDL Chol Calc (NIH): 37 mg/dL (ref 0–99)
Triglycerides: 98 mg/dL (ref 0–149)
VLDL Cholesterol Cal: 19 mg/dL (ref 5–40)

## 2024-08-08 LAB — HEMOGLOBIN A1C
Est. average glucose Bld gHb Est-mCnc: 123 mg/dL
Hgb A1c MFr Bld: 5.9 % — ABNORMAL HIGH (ref 4.8–5.6)

## 2024-08-09 LAB — "PAIN MGT SCRN (14 DRUGS), UR                    "
Amphetamine Scrn, Ur: NEGATIVE ng/mL
BARBITURATE SCREEN URINE: NEGATIVE ng/mL
BENZODIAZEPINE SCREEN, URINE: NEGATIVE ng/mL
Buprenorphine, Urine: NEGATIVE ng/mL
CANNABINOIDS UR QL SCN: NEGATIVE ng/mL
Cocaine (Metab) Scrn, Ur: NEGATIVE ng/mL
Creatinine(Crt), U: 37 mg/dL (ref 20.0–300.0)
Fentanyl, Urine: NEGATIVE pg/mL
Meperidine Screen, Urine: NEGATIVE ng/mL
Methadone Screen, Urine: NEGATIVE ng/mL
OXYCODONE+OXYMORPHONE UR QL SCN: POSITIVE ng/mL — AB
Opiate Scrn, Ur: POSITIVE ng/mL — AB
Ph of Urine: 6.4 (ref 4.5–8.9)
Phencyclidine Qn, Ur: NEGATIVE ng/mL
Propoxyphene Scrn, Ur: NEGATIVE ng/mL
Tramadol Screen, Urine: NEGATIVE ng/mL

## 2024-08-14 NOTE — Progress Notes (Unsigned)
 "  Telephone Visit- Progress Note: Referring Physician:  [name], MD [address] [place],  [place] [address]  Primary Physician:  [name], MD  This visit was performed via telephone.  Patient location: home Provider location: office  I spent a total of 15 minutes non-face-to-face activities for this visit on the date of this encounter including review of current clinical condition and response to treatment.    Patient has given verbal consent to this telephone visits and we reviewed the limitations of a telephone visit. Patient wishes to proceed.    Chief Complaint:  follow up  History of Present Illness: Brian [name] is a 84 y.o. male has a history of  ischemic bowel disease, MI, HTN, CAD, ischemic cardiomyopathy, history of PE, sleep apnea, hyperlipidemia, chronic pain syndrome, skin CA.   He has known T12 compression fracture that likely occurred around 09/09/23. He had kyphoplasty procedure at T12 on 11/25/23 with improvement in pain.   He also has known underlying lumbar scoliosis, spondylosis, and multilevel foraminal stenosis. History of right drop foot years ago and he still has weakness.   He had bilateral L3-L5 RFA with Dr. Avanell on 07/03/24.  He is here for follow up.   He feels like he had improvement with RFA. He has good days and bad days. On good days, he has minimal pain in his back. No leg pain. No numbness or tingling in his legs. He has known right foot drop.   He did a lot of PT last year- he still does a few exercises prn.   He continues on oxycodone  and baclofen  from his PCP.   He is caregiver for his wife.   He smokes 1/2 PPD x 60+ years. He recently quit, but smokes a few intermittently.    Conservative measures:  Physical therapy: initial eval at [facility] on 02/08/24 with 8 visits through 03/23/24. He continued with PT through 05/08/24.  Injections:  07/03/2024: RFA to the bilateral L4-5 and L5-S1 facet joints   03/27/24 bilateral MBB L3, L4, L5 01/31/24 bilateral S1 TF ESI  01/03/24 bilateral S1 TF ESI   Surgery:  Kyphoplasty T12 on 11/25/23  The symptoms are causing a significant impact on the patient's life.     Exam: No exam done as this was a telephone encounter.     Imaging: Nothing recent.   Assessment and Plan: [name] has known T12 compression fracture that likely occurred around 09/09/23. He had kyphoplasty procedure at T12 on 11/25/23 with improvement in pain.    He feels like he had improvement with RFA. He has good days and bad days. On good days, he has minimal pain in his back. No leg pain. No numbness or tingling in his legs. He has known right foot drop.  He has known underlying lumbar scoliosis, spondylosis, and multilevel foraminal stenosis.    Treatment options discussed with patient and following plan made:   - Hold on further treatment for his back.  - If he gets worse, can repeat RFA after 01/01/25, consider ESI if he develops leg pain, or consider SCS.  - He will f/u with me in 6 months and prn.   Of note, he saw Dr. Clois back in 2024 for 4mm cavernous right ICA aneurysm. This was seen on CTA of head/neck from 10/06/22. He had repeat CTA of head/neck on 02/08/23 that showed no change.   Discussed aneurysm with Dr. Clois. No further imaging required unless he has new  symptoms (worsening headaches, double vision, vision changes). If he is concerned and would like for this to be followed, then recommend he see Dr. Lester in [place]. Will call and let him know.   [name] PA-C Neurosurgery  "

## 2024-08-15 ENCOUNTER — Encounter: Payer: Self-pay | Admitting: Orthopedic Surgery

## 2024-08-15 ENCOUNTER — Ambulatory Visit: Admitting: Orthopedic Surgery

## 2024-08-15 ENCOUNTER — Telehealth: Payer: Self-pay | Admitting: Orthopedic Surgery

## 2024-08-15 DIAGNOSIS — I671 Cerebral aneurysm, nonruptured: Secondary | ICD-10-CM

## 2024-08-15 DIAGNOSIS — M48061 Spinal stenosis, lumbar region without neurogenic claudication: Secondary | ICD-10-CM | POA: Diagnosis not present

## 2024-08-15 DIAGNOSIS — M4186 Other forms of scoliosis, lumbar region: Secondary | ICD-10-CM

## 2024-08-15 DIAGNOSIS — M47816 Spondylosis without myelopathy or radiculopathy, lumbar region: Secondary | ICD-10-CM

## 2024-08-15 NOTE — Telephone Encounter (Signed)
 He saw Dr. Clois back in 2024 for 4mm cavernous right ICA aneurysm. This was seen on CTA of head/neck from 10/06/22. He had repeat CTA of head/neck on 02/08/23 that showed no change.   Please call and let him know that I discussed this aneurysm with Dr. Clois. No further imaging required unless he has new symptoms (worsening headaches, double vision, vision changes), however if patient is concerned and would like for this to be followed, then recommend he see Dr. Lester in [place].

## 2024-08-15 NOTE — Telephone Encounter (Signed)
 LMOM for patient to return call.

## 2024-08-17 NOTE — Telephone Encounter (Signed)
 Patient notified and voiced understanding. He has no changes and if he decides he needs further evaluation he will let us  know and contact Dr. Lester.

## 2024-08-29 ENCOUNTER — Ambulatory Visit: Admitting: Urology

## 2024-08-31 ENCOUNTER — Ambulatory Visit: Payer: Medicare Other | Admitting: Urology

## 2024-09-26 ENCOUNTER — Ambulatory Visit: Admitting: Student in an Organized Health Care Education/Training Program

## 2024-10-10 ENCOUNTER — Ambulatory Visit: Admitting: Dermatology

## 2024-10-30 ENCOUNTER — Ambulatory Visit: Admitting: Internal Medicine

## 2024-11-06 ENCOUNTER — Ambulatory Visit: Admitting: Family Medicine

## 2025-02-12 ENCOUNTER — Ambulatory Visit: Admitting: Orthopedic Surgery

## 2025-06-05 ENCOUNTER — Ambulatory Visit
